# Patient Record
Sex: Female | Born: 1995 | Race: Black or African American | Hispanic: No | Marital: Single | Smoking: Never smoker
Health system: Southern US, Community
[De-identification: ages and names within clinical notes are randomized; demographics above are authoritative.]

## PROBLEM LIST (undated history)

## (undated) ENCOUNTER — Inpatient Hospital Stay (HOSPITAL_COMMUNITY): Payer: Self-pay

## (undated) ENCOUNTER — Ambulatory Visit: Admission: EM | Payer: Medicaid Other

## (undated) DIAGNOSIS — J45909 Unspecified asthma, uncomplicated: Secondary | ICD-10-CM

## (undated) DIAGNOSIS — K8020 Calculus of gallbladder without cholecystitis without obstruction: Secondary | ICD-10-CM

## (undated) DIAGNOSIS — K5900 Constipation, unspecified: Secondary | ICD-10-CM

## (undated) DIAGNOSIS — N390 Urinary tract infection, site not specified: Secondary | ICD-10-CM

## (undated) DIAGNOSIS — D649 Anemia, unspecified: Secondary | ICD-10-CM

## (undated) DIAGNOSIS — R569 Unspecified convulsions: Secondary | ICD-10-CM

## (undated) DIAGNOSIS — N189 Chronic kidney disease, unspecified: Secondary | ICD-10-CM

## (undated) HISTORY — PX: TYMPANOSTOMY TUBE PLACEMENT: SHX32

## (undated) HISTORY — DX: Unspecified asthma, uncomplicated: J45.909

## (undated) HISTORY — PX: BACK SURGERY: SHX140

## (undated) HISTORY — DX: Calculus of gallbladder without cholecystitis without obstruction: K80.20

---
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
## 2017-01-05 ENCOUNTER — Inpatient Hospital Stay (HOSPITAL_COMMUNITY)
Admission: AD | Admit: 2017-01-05 | Discharge: 2017-01-06 | Disposition: A | Discharge status: Home / Self Care | Payer: Medicaid Other | Source: Ambulatory Visit | Attending: Obstetrics & Gynecology | Admitting: Obstetrics & Gynecology

## 2017-01-05 DIAGNOSIS — Z283 Underimmunization status: Secondary | ICD-10-CM

## 2017-01-05 DIAGNOSIS — Z87891 Personal history of nicotine dependence: Secondary | ICD-10-CM | POA: Insufficient documentation

## 2017-01-05 DIAGNOSIS — O26893 Other specified pregnancy related conditions, third trimester: Secondary | ICD-10-CM | POA: Insufficient documentation

## 2017-01-05 DIAGNOSIS — O26833 Pregnancy related renal disease, third trimester: Secondary | ICD-10-CM | POA: Diagnosis not present

## 2017-01-05 DIAGNOSIS — R109 Unspecified abdominal pain: Secondary | ICD-10-CM

## 2017-01-05 DIAGNOSIS — O2340 Unspecified infection of urinary tract in pregnancy, unspecified trimester: Secondary | ICD-10-CM

## 2017-01-05 DIAGNOSIS — O2343 Unspecified infection of urinary tract in pregnancy, third trimester: Secondary | ICD-10-CM | POA: Diagnosis not present

## 2017-01-05 DIAGNOSIS — O9989 Other specified diseases and conditions complicating pregnancy, childbirth and the puerperium: Secondary | ICD-10-CM

## 2017-01-05 DIAGNOSIS — N189 Chronic kidney disease, unspecified: Secondary | ICD-10-CM | POA: Insufficient documentation

## 2017-01-05 DIAGNOSIS — O09899 Supervision of other high risk pregnancies, unspecified trimester: Secondary | ICD-10-CM

## 2017-01-05 DIAGNOSIS — K8020 Calculus of gallbladder without cholecystitis without obstruction: Secondary | ICD-10-CM

## 2017-01-05 DIAGNOSIS — R102 Pelvic and perineal pain: Secondary | ICD-10-CM | POA: Diagnosis present

## 2017-01-05 DIAGNOSIS — O26613 Liver and biliary tract disorders in pregnancy, third trimester: Secondary | ICD-10-CM | POA: Insufficient documentation

## 2017-01-05 DIAGNOSIS — J45909 Unspecified asthma, uncomplicated: Secondary | ICD-10-CM | POA: Diagnosis not present

## 2017-01-05 DIAGNOSIS — O99513 Diseases of the respiratory system complicating pregnancy, third trimester: Secondary | ICD-10-CM | POA: Diagnosis not present

## 2017-01-05 DIAGNOSIS — Z3A33 33 weeks gestation of pregnancy: Secondary | ICD-10-CM | POA: Insufficient documentation

## 2017-01-05 LAB — URINALYSIS, ROUTINE W REFLEX MICROSCOPIC
BILIRUBIN URINE: NEGATIVE
Glucose, UA: NEGATIVE mg/dL
HGB URINE DIPSTICK: NEGATIVE
Ketones, ur: NEGATIVE mg/dL
NITRITE: NEGATIVE
PROTEIN: NEGATIVE mg/dL
Specific Gravity, Urine: 1.005 (ref 1.005–1.030)
pH: 7 (ref 5.0–8.0)

## 2017-01-05 NOTE — MAU Note (Signed)
Pt not in lobby.  

## 2017-01-05 NOTE — Discharge Instructions (Signed)
Low-Fat Diet for Pancreatitis or Gallbladder Conditions A low-fat diet can be helpful if you have pancreatitis or a gallbladder condition. With these conditions, your pancreas and gallbladder have trouble digesting fats. A healthy eating plan with less fat will help rest your pancreas and gallbladder and reduce your symptoms. What do I need to know about this diet?  Eat a low-fat diet. ? Reduce your fat intake to less than 20-30% of your total daily calories. This is less than 50-60 g of fat per day. ? Remember that you need some fat in your diet. Ask your dietician what your daily goal should be. ? Choose nonfat and low-fat healthy foods. Look for the words "nonfat," "low fat," or "fat free." ? As a guide, look on the label and choose foods with less than 3 g of fat per serving. Eat only one serving.  Avoid alcohol.  Do not smoke. If you need help quitting, talk with your health care provider.  Eat small frequent meals instead of three large heavy meals. What foods can I eat? Grains Include healthy grains and starches such as potatoes, wheat bread, fiber-rich cereal, and brown rice. Choose whole grain options whenever possible. In adults, whole grains should account for 45-65% of your daily calories. Fruits and Vegetables Eat plenty of fruits and vegetables. Fresh fruits and vegetables add fiber to your diet. Meats and Other Protein Sources Eat lean meat such as chicken and pork. Trim any fat off of meat before cooking it. Eggs, fish, and beans are other sources of protein. In adults, these foods should account for 10-35% of your daily calories. Dairy Choose low-fat milk and dairy options. Dairy includes fat and protein, as well as calcium. Fats and Oils Limit high-fat foods such as fried foods, sweets, baked goods, sugary drinks. Other Creamy sauces and condiments, such as mayonnaise, can add extra fat. Think about whether or not you need to use them, or use smaller amounts or low fat  options. What foods are not recommended?  High fat foods, such as: ? Baked goods. ? Ice cream. ? French toast. ? Sweet rolls. ? Pizza. ? Cheese bread. ? Foods covered with batter, butter, creamy sauces, or cheese. ? Fried foods. ? Sugary drinks and desserts.  Foods that cause gas or bloating This information is not intended to replace advice given to you by your health care provider. Make sure you discuss any questions you have with your health care provider. Document Released: 08/03/2013 Document Revised: 01/04/2016 Document Reviewed: 07/12/2013 Elsevier Interactive Patient Education  2017 Elsevier Inc.  

## 2017-01-05 NOTE — MAU Provider Note (Signed)
History     CSN: [id number]  Arrival date and time: 01/05/17 2252   First Provider Initiated Contact with Patient 01/05/17 2341      Chief Complaint  Patient presents with  . Pelvic Pain   Frances [name] is a 21 y.o. G1P0 at 4564w2d who presents today with abdominal pain. Pain is mostly upper abdominal/epigastric that radiates to her back. She did have some pelvic pressure earlier today as well. She denies any VB or LOF. She reports normal fetal movement. She has history of gallstones. No other complications with this pregnancy. She last ate waffles before the pain started.    Pelvic Pain  The patient's primary symptoms include pelvic pain. The patient's pertinent negatives include no vaginal discharge. This is a new problem. The current episode started today. The problem occurs intermittently. The problem has been unchanged. Pain severity now: 8/10. The problem affects both sides. She is pregnant. Associated symptoms include abdominal pain. Pertinent negatives include no chills, dysuria, fever, frequency, nausea, urgency or vomiting. The vaginal discharge was normal. There has been no bleeding. Exacerbated by: No intercourse in the last 24 hours.  She has tried nothing for the symptoms. Menstrual history: LMP 05/17/17   Abdominal Pain  This is a new problem. The current episode started today. The onset quality is sudden. The problem occurs intermittently. The problem has been unchanged. The pain is located in the epigastric region. The pain is at a severity of 8/10. The quality of the pain is sharp. The abdominal pain radiates to the back. Pertinent negatives include no dysuria, fever, frequency, nausea or vomiting. Nothing aggravates the pain. The pain is relieved by nothing. She has tried nothing for the symptoms. Her past medical history is significant for gallstones.    Past Medical History:  Diagnosis Date  . Asthma 1999   as a child  . Chronic kidney disease   . Constipation   .  UTI (lower urinary tract infection)     Past Surgical History:  Procedure Laterality Date  . TYMPANOSTOMY TUBE PLACEMENT      Family History  Problem Relation Age of Onset  . Asthma Mother   . Hypertension Mother   . Asthma Sister   . Birth defects Sister     Social History  Substance Use Topics  . Smoking status: Former Smoker    Types: Cigarettes  . Smokeless tobacco: Former NeurosurgeonUser  . Alcohol use Yes     Comment: occ    Allergies: No Known Allergies  Prescriptions Prior to Admission  Medication Sig Dispense Refill Last Dose  . docusate sodium (COLACE) 100 MG capsule Take 1 capsule (100 mg total) by mouth 2 (two) times daily as needed. 30 capsule 2   . ferrous gluconate (FERGON) 324 MG tablet Take 1 tablet (324 mg total) by mouth 2 (two) times daily with a meal. 60 tablet 3   . metoCLOPramide (REGLAN) 10 MG tablet Take 1 tablet (10 mg total) by mouth every 6 (six) hours as needed for nausea. 20 tablet 0 Taking  . nitrofurantoin, macrocrystal-monohydrate, (MACROBID) 100 MG capsule One tab po bid (take 2nd dose qhs so it can sit in your bladder overnight) x 10d and then qhs for the rest of pregnancy (Patient not taking: Reported on 12/24/2016) 40 capsule 4 Not Taking  . oxyCODONE (OXY IR/ROXICODONE) 5 MG immediate release tablet Take 1 tablet (5 mg total) by mouth every 6 (six) hours as needed for severe pain. (Patient not taking: Reported on  12/24/2016) 20 tablet 0 Not Taking  . Prenatal Vit-Fe Fumarate-FA (PRENATAL COMPLETE) 14-0.4 MG TABS Take 1 tablet by mouth daily. 60 each 0 Taking    Review of Systems  Constitutional: Negative for chills and fever.  Gastrointestinal: Positive for abdominal pain. Negative for nausea and vomiting.  Genitourinary: Positive for pelvic pain. Negative for dysuria, frequency, urgency, vaginal bleeding and vaginal discharge.   Physical Exam   Blood pressure 114/70, pulse 99, resp. rate 18, height 5' 7.5" (1.715 m), weight 191 lb (86.6 kg), last  menstrual period 05/17/2016, SpO2 99 %.  Physical Exam  Nursing note and vitals reviewed. Constitutional: She is oriented to person, place, and time. She appears well-developed and well-nourished. No distress.  HENT:  Head: Normocephalic.  Cardiovascular: Normal rate.   Respiratory: Effort normal.  GI: Soft. There is no tenderness. There is no rebound.  Genitourinary:  Genitourinary Comments: Cervix: closed/thick/ballotable   Neurological: She is alert and oriented to person, place, and time.  Skin: Skin is warm and dry.  Psychiatric: She has a normal mood and affect.   FHT: 150, moderate with 15x15 accels, no decels Toco: no UCs  MAU Course  Procedures  MDM   Assessment and Plan   Gallstones Abdominal pain in pregnancy, 3rd trimester  [redacted] week gestation   DC home Comfort measures reviewed  Low fat diet emphasized 3rd Trimester precautions  PTL precautions  Fetal kick counts RX: none Return to MAU as needed FU with OB as planned  Follow-up Information    [facility].   Specialty:  Obstetrics and Gynecology Contact information: [address] [place] [address] [phone]           [name] 01/05/2017, 11:42 PM

## 2017-01-05 NOTE — MAU Note (Signed)
Pt c/o constant abdominal pain and lower back pain all day today and pelvic pressure. Pt unsure if she is having contractions. Pt denies LOF or vaginal bleeding. Reports good fetal movement.

## 2017-01-15 ENCOUNTER — Ambulatory Visit (INDEPENDENT_AMBULATORY_CARE_PROVIDER_SITE_OTHER): Payer: Medicaid Other | Admitting: Obstetrics and Gynecology

## 2017-01-15 VITALS — BP 119/63 | HR 92 | Wt 193.2 lb

## 2017-01-15 DIAGNOSIS — O2340 Unspecified infection of urinary tract in pregnancy, unspecified trimester: Secondary | ICD-10-CM

## 2017-01-15 DIAGNOSIS — O99013 Anemia complicating pregnancy, third trimester: Secondary | ICD-10-CM

## 2017-01-15 DIAGNOSIS — O2343 Unspecified infection of urinary tract in pregnancy, third trimester: Secondary | ICD-10-CM

## 2017-01-15 DIAGNOSIS — K8020 Calculus of gallbladder without cholecystitis without obstruction: Secondary | ICD-10-CM

## 2017-01-15 DIAGNOSIS — D649 Anemia, unspecified: Secondary | ICD-10-CM

## 2017-01-15 DIAGNOSIS — O99613 Diseases of the digestive system complicating pregnancy, third trimester: Secondary | ICD-10-CM

## 2017-01-15 DIAGNOSIS — Z3480 Encounter for supervision of other normal pregnancy, unspecified trimester: Secondary | ICD-10-CM

## 2017-01-15 NOTE — Progress Notes (Signed)
Prenatal Visit Note Date: 01/15/2017 Clinic: [facility]  Subjective:  Frances [name] is a 21 y.o. G1P0 at 7690w5d being seen today for ongoing prenatal care.  She is currently monitored for the following issues for this low-risk pregnancy and has Supervision of normal pregnancy, antepartum; Rubella non-immune status, antepartum; UTI in pregnancy, antepartum; Healthcare maintenance; Recurrent UTI; Gallstones; and Anemia affecting pregnancy on her problem list.  Patient reports no complaints.   Contractions: Not present. Vag. Bleeding: None.  Movement: Present. Denies leaking of fluid.   The following portions of the patient's history were reviewed and updated as appropriate: allergies, current medications, past family history, past medical history, past social history, past surgical history and problem list. Problem list updated.  Objective:   Vitals:   01/15/17 0856  BP: 119/63  Pulse: 92  Weight: 193 lb 3.2 oz (87.6 kg)    Fetal Status: Fetal Heart Rate (bpm): 151 (Simultaneous filing. User may not have seen previous data.) Fundal Height: 33 cm Movement: Present  Presentation: Vertex  General:  Alert, oriented and cooperative. Patient is in no acute distress.  Skin: Skin is warm and dry. No rash noted.   Cardiovascular: Normal heart rate noted  Respiratory: Normal respiratory effort, no problems with respiration noted  Abdomen: Soft, gravid, appropriate for gestational age. Pain/Pressure: Absent     Pelvic:  Cervical exam deferred        Extremities: Normal range of motion.  Edema: None  Mental Status: Normal mood and affect. Normal behavior. Normal judgment and thought content.   Urinalysis:      Assessment and Plan:  Pregnancy: G1P0 at 4390w5d  1. Supervision of other normal pregnancy, antepartum Routine care. gbs nv. D/w pt re: BC  2. UTI in pregnancy, antepartum No issues on qhs macrobid.   3. Gallstones No issues  4. Anemia affecting pregnancy  in third trimester Doing well on bid iron  Preterm labor symptoms and general obstetric precautions including but not limited to vaginal bleeding, contractions, leaking of fluid and fetal movement were reviewed in detail with the patient. Please refer to After Visit Summary for other counseling recommendations.  Return in about 2 weeks (around 01/29/2017).   [place] BingPickens, Gurnie Duris, MD

## 2017-01-29 ENCOUNTER — Other Ambulatory Visit (HOSPITAL_COMMUNITY)
Admission: RE | Admit: 2017-01-29 | Discharge: 2017-01-29 | Disposition: A | Discharge status: Home / Self Care | Payer: Medicaid Other | Source: Ambulatory Visit | Attending: Advanced Practice Midwife | Admitting: Advanced Practice Midwife

## 2017-01-29 ENCOUNTER — Encounter: Payer: Self-pay | Admitting: Advanced Practice Midwife

## 2017-01-29 ENCOUNTER — Ambulatory Visit (INDEPENDENT_AMBULATORY_CARE_PROVIDER_SITE_OTHER): Payer: Medicaid Other | Admitting: Advanced Practice Midwife

## 2017-01-29 VITALS — BP 123/65 | HR 94 | Wt 195.1 lb

## 2017-01-29 DIAGNOSIS — Z113 Encounter for screening for infections with a predominantly sexual mode of transmission: Secondary | ICD-10-CM

## 2017-01-29 DIAGNOSIS — Z3480 Encounter for supervision of other normal pregnancy, unspecified trimester: Secondary | ICD-10-CM

## 2017-01-29 DIAGNOSIS — Z3483 Encounter for supervision of other normal pregnancy, third trimester: Secondary | ICD-10-CM | POA: Insufficient documentation

## 2017-01-29 LAB — OB RESULTS CONSOLE GBS: GBS: NEGATIVE

## 2017-01-29 NOTE — Progress Notes (Signed)
Patient states she noticed a lot of thick mucous when she wiped after using the bathroom. Patient states this happened last week  3 to4 days in a row, it was clear and white in color with no odor.

## 2017-01-29 NOTE — Progress Notes (Signed)
   PRENATAL VISIT NOTE  Subjective:  Malachi ParadiseCaaliegh N Bambach is a 21 y.o. G1P0 at 2574w5d being seen today for ongoing prenatal care.  She is currently monitored for the following issues for this low-risk pregnancy and has Supervision of normal pregnancy, antepartum; Rubella non-immune status, antepartum; UTI in pregnancy, antepartum; Healthcare maintenance; Recurrent UTI; Gallstones; and Anemia affecting pregnancy on her problem list.  Patient reports no complaints.  Contractions: Irregular. Vag. Bleeding: None.  Movement: Present. Denies leaking of fluid.   The following portions of the patient's history were reviewed and updated as appropriate: allergies, current medications, past family history, past medical history, past social history, past surgical history and problem list. Problem list updated.  Objective:   Vitals:   01/29/17 1420  BP: 123/65  Pulse: 94  Weight: 195 lb 1.6 oz (88.5 kg)    Fetal Status: Fetal Heart Rate (bpm): 150   Movement: Present     General:  Alert, oriented and cooperative. Patient is in no acute distress.  Skin: Skin is warm and dry. No rash noted.   Cardiovascular: Normal heart rate noted  Respiratory: Normal respiratory effort, no problems with respiration noted  Abdomen: Soft, gravid, appropriate for gestational age. Pain/Pressure: Present     Pelvic:  Cervical exam deferred        Extremities: Normal range of motion.  Edema: None  Mental Status: Normal mood and affect. Normal behavior. Normal judgment and thought content.   Assessment and Plan:  Pregnancy: G1P0 at 8374w5d  1. Supervision of other normal pregnancy, antepartum  - Culture, beta strep (group b only) - [facility]Chlam[facility]  Term labor symptoms and general obstetric precautions including but not limited to vaginal bleeding, contractions, leaking of fluid and fetal movement were reviewed in detail with the patient. Please refer to After Visit Summary for other  counseling recommendations.  Return in about 1 week (around 02/05/2017).   Sharen CounterLisa Leftwich-Kirby, CNM

## 2017-01-29 NOTE — Patient Instructions (Signed)
Labor Precautions Reasons to come to [facility]:  1.  Contractions are  5 minutes apart or less, each last 1 minute, these have been going on for 1-2 hours, and you cannot walk or talk during them 2.  You have a large gush of fluid, or a trickle of fluid that will not stop and you have to wear a pad 3.  You have bleeding that is bright red, heavier than spotting--like menstrual bleeding (spotting can be normal in early labor or after a check of your cervix) 4.  You do not feel the baby moving like he/she normally does    Third Trimester of Pregnancy The third trimester is from week 28 through week 40 (months 7 through 9). The third trimester is a time when the unborn baby (fetus) is growing rapidly. At the end of the ninth month, the fetus is about 20 inches in length and weighs 6-10 pounds. Body changes during your third trimester Your body will continue to go through many changes during pregnancy. The changes vary from woman to woman. During the third trimester:  Your weight will continue to increase. You can expect to gain 25-35 pounds (11-16 kg) by the end of the pregnancy.  You may begin to get stretch marks on your hips, abdomen, and breasts.  You may urinate more often because the fetus is moving lower into your pelvis and pressing on your bladder.  You may develop or continue to have heartburn. This is caused by increased hormones that slow down muscles in the digestive tract.  You may develop or continue to have constipation because increased hormones slow digestion and cause the muscles that push waste through your intestines to relax.  You may develop hemorrhoids. These are swollen veins (varicose veins) in the rectum that can itch or be painful.  You may develop swollen, bulging veins (varicose veins) in your legs.  You may have increased body aches in the pelvis, back, or thighs. This is due to weight gain and increased hormones that are relaxing your joints.  You may have  changes in your hair. These can include thickening of your hair, rapid growth, and changes in texture. Some women also have hair loss during or after pregnancy, or hair that feels dry or thin. Your hair will most likely return to normal after your baby is born.  Your breasts will continue to grow and they will continue to become tender. A yellow fluid (colostrum) may leak from your breasts. This is the first milk you are producing for your baby.  Your belly button may stick out.  You may notice more swelling in your hands, face, or ankles.  You may have increased tingling or numbness in your hands, arms, and legs. The skin on your belly may also feel numb.  You may feel short of breath because of your expanding uterus.  You may have more problems sleeping. This can be caused by the size of your belly, increased need to urinate, and an increase in your body's metabolism.  You may notice the fetus "dropping," or moving lower in your abdomen (lightening).  You may have increased vaginal discharge.  You may notice your joints feel loose and you may have pain around your pelvic bone.  What to expect at prenatal visits You will have prenatal exams every 2 weeks until week 36. Then you will have weekly prenatal exams. During a routine prenatal visit:  You will be weighed to make sure you and the baby are growing   normally.  Your blood pressure will be taken.  Your abdomen will be measured to track your baby's growth.  The fetal heartbeat will be listened to.  Any test results from the previous visit will be discussed.  You may have a cervical check near your due date to see if your cervix has softened or thinned (effaced).  You will be tested for Group B streptococcus. This happens between 35 and 37 weeks.  Your health care provider may ask you:  What your birth plan is.  How you are feeling.  If you are feeling the baby move.  If you have had any abnormal symptoms, such as  leaking fluid, bleeding, severe headaches, or abdominal cramping.  If you are using any tobacco products, including cigarettes, chewing tobacco, and electronic cigarettes.  If you have any questions.  Other tests or screenings that may be performed during your third trimester include:  Blood tests that check for low iron levels (anemia).  Fetal testing to check the health, activity level, and growth of the fetus. Testing is done if you have certain medical conditions or if there are problems during the pregnancy.  Nonstress test (NST). This test checks the health of your baby to make sure there are no signs of problems, such as the baby not getting enough oxygen. During this test, a belt is placed around your belly. The baby is made to move, and its heart rate is monitored during movement.  What is false labor? False labor is a condition in which you feel small, irregular tightenings of the muscles in the womb (contractions) that usually go away with rest, changing position, or drinking water. These are called Braxton Hicks contractions. Contractions may last for hours, days, or even weeks before true labor sets in. If contractions come at regular intervals, become more frequent, increase in intensity, or become painful, you should see your health care provider. What are the signs of labor?  Abdominal cramps.  Regular contractions that start at 10 minutes apart and become stronger and more frequent with time.  Contractions that start on the top of the uterus and spread down to the lower abdomen and back.  Increased pelvic pressure and dull back pain.  A watery or bloody mucus discharge that comes from the vagina.  Leaking of amniotic fluid. This is also known as your "water breaking." It could be a slow trickle or a gush. Let your health care provider know if it has a color or strange odor. If you have any of these signs, call your health care provider right away, even if it is before  your due date. Follow these instructions at home: Medicines  Follow your health care provider's instructions regarding medicine use. Specific medicines may be either safe or unsafe to take during pregnancy.  Take a prenatal vitamin that contains at least 600 micrograms (mcg) of folic acid.  If you develop constipation, try taking a stool softener if your health care provider approves. Eating and drinking  Eat a balanced diet that includes fresh fruits and vegetables, whole grains, good sources of protein such as meat, eggs, or tofu, and low-fat dairy. Your health care provider will help you determine the amount of weight gain that is right for you.  Avoid raw meat and uncooked cheese. These carry germs that can cause birth defects in the baby.  If you have low calcium intake from food, talk to your health care provider about whether you should take a daily calcium supplement.    Eat four or five small meals rather than three large meals a day.  Limit foods that are high in fat and processed sugars, such as fried and sweet foods.  To prevent constipation: ? Drink enough fluid to keep your urine clear or pale yellow. ? Eat foods that are high in fiber, such as fresh fruits and vegetables, whole grains, and beans. Activity  Exercise only as directed by your health care provider. Most women can continue their usual exercise routine during pregnancy. Try to exercise for 30 minutes at least 5 days a week. Stop exercising if you experience uterine contractions.  Avoid heavy lifting.  Do not exercise in extreme heat or humidity, or at high altitudes.  Wear low-heel, comfortable shoes.  Practice good posture.  You may continue to have sex unless your health care provider tells you otherwise. Relieving pain and discomfort  Take frequent breaks and rest with your legs elevated if you have leg cramps or low back pain.  Take warm sitz baths to soothe any pain or discomfort caused by  hemorrhoids. Use hemorrhoid cream if your health care provider approves.  Wear a good support bra to prevent discomfort from breast tenderness.  If you develop varicose veins: ? Wear support pantyhose or compression stockings as told by your healthcare provider. ? Elevate your feet for 15 minutes, 3-4 times a day. Prenatal care  Write down your questions. Take them to your prenatal visits.  Keep all your prenatal visits as told by your health care provider. This is important. Safety  Wear your seat belt at all times when driving.  Make a list of emergency phone numbers, including numbers for family, friends, the hospital, and police and fire departments. General instructions  Avoid cat litter boxes and soil used by cats. These carry germs that can cause birth defects in the baby. If you have a cat, ask someone to clean the litter box for you.  Do not travel far distances unless it is absolutely necessary and only with the approval of your health care provider.  Do not use hot tubs, steam rooms, or saunas.  Do not drink alcohol.  Do not use any products that contain nicotine or tobacco, such as cigarettes and e-cigarettes. If you need help quitting, ask your health care provider.  Do not use any medicinal herbs or unprescribed drugs. These chemicals affect the formation and growth of the baby.  Do not douche or use tampons or scented sanitary pads.  Do not cross your legs for long periods of time.  To prepare for the arrival of your baby: ? Take prenatal classes to understand, practice, and ask questions about labor and delivery. ? Make a trial run to the hospital. ? Visit the hospital and tour the maternity area. ? Arrange for maternity or paternity leave through employers. ? Arrange for family and friends to take care of pets while you are in the hospital. ? Purchase a rear-facing car seat and make sure you know how to install it in your car. ? Pack your hospital  bag. ? Prepare the baby's nursery. Make sure to remove all pillows and stuffed animals from the baby's crib to prevent suffocation.  Visit your dentist if you have not gone during your pregnancy. Use a soft toothbrush to brush your teeth and be gentle when you floss. Contact a health care provider if:  You are unsure if you are in labor or if your water has broken.  You become dizzy.  You have   mild pelvic cramps, pelvic pressure, or nagging pain in your abdominal area.  You have lower back pain.  You have persistent nausea, vomiting, or diarrhea.  You have an unusual or bad smelling vaginal discharge.  You have pain when you urinate. Get help right away if:  Your water breaks before 37 weeks.  You have regular contractions less than 5 minutes apart before 37 weeks.  You have a fever.  You are leaking fluid from your vagina.  You have spotting or bleeding from your vagina.  You have severe abdominal pain or cramping.  You have rapid weight loss or weight gain.  You have shortness of breath with chest pain.  You notice sudden or extreme swelling of your face, hands, ankles, feet, or legs.  Your baby makes fewer than 10 movements in 2 hours.  You have severe headaches that do not go away when you take medicine.  You have vision changes. Summary  The third trimester is from week 28 through week 40, months 7 through 9. The third trimester is a time when the unborn baby (fetus) is growing rapidly.  During the third trimester, your discomfort may increase as you and your baby continue to gain weight. You may have abdominal, leg, and back pain, sleeping problems, and an increased need to urinate.  During the third trimester your breasts will keep growing and they will continue to become tender. A yellow fluid (colostrum) may leak from your breasts. This is the first milk you are producing for your baby.  False labor is a condition in which you feel small, irregular  tightenings of the muscles in the womb (contractions) that eventually go away. These are called Braxton Hicks contractions. Contractions may last for hours, days, or even weeks before true labor sets in.  Signs of labor can include: abdominal cramps; regular contractions that start at 10 minutes apart and become stronger and more frequent with time; watery or bloody mucus discharge that comes from the vagina; increased pelvic pressure and dull back pain; and leaking of amniotic fluid. This information is not intended to replace advice given to you by your health care provider. Make sure you discuss any questions you have with your health care provider. Document Released: 07/23/2001 Document Revised: 01/04/2016 Document Reviewed: 09/29/2012 Elsevier Interactive Patient Education  2017 [employer]  

## 2017-01-30 LAB — GC/CHLAMYDIA PROBE AMP (~~LOC~~) NOT AT ARMC
CHLAMYDIA, DNA PROBE: NEGATIVE
Neisseria Gonorrhea: NEGATIVE

## 2017-02-02 LAB — CULTURE, BETA STREP (GROUP B ONLY): Strep Gp B Culture: NEGATIVE

## 2017-02-05 ENCOUNTER — Ambulatory Visit (INDEPENDENT_AMBULATORY_CARE_PROVIDER_SITE_OTHER): Payer: Medicaid Other | Admitting: Student

## 2017-02-05 DIAGNOSIS — Z3403 Encounter for supervision of normal first pregnancy, third trimester: Secondary | ICD-10-CM

## 2017-02-05 DIAGNOSIS — Z3400 Encounter for supervision of normal first pregnancy, unspecified trimester: Secondary | ICD-10-CM

## 2017-02-05 NOTE — Progress Notes (Signed)
   PRENATAL VISIT NOTE  Subjective:  Frances [name] is a 21 y.o. G1P0 at 3165w5d being seen today for ongoing prenatal care.  She is currently monitored for the following issues for this low-risk pregnancy and has Supervision of normal pregnancy, antepartum; Rubella non-immune status, antepartum; UTI in pregnancy, antepartum; Healthcare maintenance; Recurrent UTI; Gallstones; and Anemia affecting pregnancy on her problem list.  Patient reports no complaints.  Contractions: Irregular.  .  Movement: Present. Denies leaking of fluid.   The following portions of the patient's history were reviewed and updated as appropriate: allergies, current medications, past family history, past medical history, past social history, past surgical history and problem list. Problem list updated.  Objective:   Vitals:   02/05/17 0927  BP: 122/63  Pulse: 73  Weight: 197 lb 4.8 oz (89.5 kg)    Fetal Status: Fetal Heart Rate (bpm): 145 Fundal Height: 37 cm Movement: Present     General:  Alert, oriented and cooperative. Patient is in no acute distress.  Skin: Skin is warm and dry. No rash noted.   Cardiovascular: Normal heart rate noted  Respiratory: Normal respiratory effort, no problems with respiration noted  Abdomen: Soft, gravid, appropriate for gestational age. Pain/Pressure: Present     Pelvic:  Cervical exam deferred        Extremities: Normal range of motion.  Edema: None  Mental Status: Normal mood and affect. Normal behavior. Normal judgment and thought content.   Assessment and Plan:  Pregnancy: G1P0 at 7665w5d  1. Supervision of normal first pregnancy, antepartum  Patient feeling well; no complaints. She is still taking her iron supplements.   Term labor symptoms and general obstetric precautions including but not limited to vaginal bleeding, contractions, leaking of fluid and fetal movement were reviewed in detail with the patient. Please refer to After Visit Summary for other counseling  recommendations.  Return in about 1 week (around 02/12/2017) for ROB.   Marylene LandKathryn Lorraine Lisseth Brazeau, CNM

## 2017-02-05 NOTE — Patient Instructions (Signed)
Etonogestrel implant What is this medicine? ETONOGESTREL (et oh noe JES trel) is a contraceptive (birth control) device. It is used to prevent pregnancy. It can be used for up to 3 years. This medicine may be used for other purposes; ask your health care provider or pharmacist if you have questions. COMMON BRAND NAME(S): Implanon, Nexplanon What should I tell my health care provider before I take this medicine? They need to know if you have any of these conditions: -abnormal vaginal bleeding -blood vessel disease or blood clots -cancer of the breast, cervix, or liver -depression -diabetes -gallbladder disease -headaches -heart disease or recent heart attack -high blood pressure -high cholesterol -kidney disease -liver disease -renal disease -seizures -tobacco smoker -an unusual or allergic reaction to etonogestrel, other hormones, anesthetics or antiseptics, medicines, foods, dyes, or preservatives -pregnant or trying to get pregnant -breast-feeding How should I use this medicine? This device is inserted just under the skin on the inner side of your upper arm by a health care professional. Talk to your pediatrician regarding the use of this medicine in children. Special care may be needed. Overdosage: If you think you have taken too much of this medicine contact a poison control center or emergency room at once. NOTE: This medicine is only for you. Do not share this medicine with others. What if I miss a dose? This does not apply. What may interact with this medicine? Do not take this medicine with any of the following medications: -amprenavir -bosentan -fosamprenavir This medicine may also interact with the following medications: -barbiturate medicines for inducing sleep or treating seizures -certain medicines for fungal infections like ketoconazole and itraconazole -grapefruit juice -griseofulvin -medicines to treat seizures like carbamazepine, felbamate, oxcarbazepine,  phenytoin, topiramate -modafinil -phenylbutazone -rifampin -rufinamide -some medicines to treat HIV infection like atazanavir, indinavir, lopinavir, nelfinavir, tipranavir, ritonavir -St. John's wort This list may not describe all possible interactions. Give your health care provider a list of all the medicines, herbs, non-prescription drugs, or dietary supplements you use. Also tell them if you smoke, drink alcohol, or use illegal drugs. Some items may interact with your medicine. What should I watch for while using this medicine? This product does not protect you against HIV infection (AIDS) or other sexually transmitted diseases. You should be able to feel the implant by pressing your fingertips over the skin where it was inserted. Contact your doctor if you cannot feel the implant, and use a non-hormonal birth control method (such as condoms) until your doctor confirms that the implant is in place. If you feel that the implant may have broken or become bent while in your arm, contact your healthcare provider. What side effects may I notice from receiving this medicine? Side effects that you should report to your doctor or health care professional as soon as possible: -allergic reactions like skin rash, itching or hives, swelling of the face, lips, or tongue -breast lumps -changes in emotions or moods -depressed mood -heavy or prolonged menstrual bleeding -pain, irritation, swelling, or bruising at the insertion site -scar at site of insertion -signs of infection at the insertion site such as fever, and skin redness, pain or discharge -signs of pregnancy -signs and symptoms of a blood clot such as breathing problems; changes in vision; chest pain; severe, sudden headache; pain, swelling, warmth in the leg; trouble speaking; sudden numbness or weakness of the face, arm or leg -signs and symptoms of liver injury like dark yellow or brown urine; general ill feeling or flu-like symptoms;  light-colored   stools; loss of appetite; nausea; right upper belly pain; unusually weak or tired; yellowing of the eyes or skin -unusual vaginal bleeding, discharge -signs and symptoms of a stroke like changes in vision; confusion; trouble speaking or understanding; severe headaches; sudden numbness or weakness of the face, arm or leg; trouble walking; dizziness; loss of balance or coordination Side effects that usually do not require medical attention (report to your doctor or health care professional if they continue or are bothersome): -acne -back pain -breast pain -changes in weight -dizziness -general ill feeling or flu-like symptoms -headache -irregular menstrual bleeding -nausea -sore throat -vaginal irritation or inflammation This list may not describe all possible side effects. Call your doctor for medical advice about side effects. You may report side effects to FDA at 1-800-FDA-1088. Where should I keep my medicine? This drug is given in a hospital or clinic and will not be stored at home. NOTE: This sheet is a summary. It may not cover all possible information. If you have questions about this medicine, talk to your doctor, pharmacist, or health care provider.  2018 Elsevier/Gold Standard (2016-02-15 11:19:22)  

## 2017-02-11 ENCOUNTER — Ambulatory Visit (INDEPENDENT_AMBULATORY_CARE_PROVIDER_SITE_OTHER): Payer: Medicaid Other | Admitting: Medical

## 2017-02-11 VITALS — BP 130/64 | HR 89 | Wt 196.0 lb

## 2017-02-11 DIAGNOSIS — Z3403 Encounter for supervision of normal first pregnancy, third trimester: Secondary | ICD-10-CM

## 2017-02-11 DIAGNOSIS — Z3400 Encounter for supervision of normal first pregnancy, unspecified trimester: Secondary | ICD-10-CM

## 2017-02-11 NOTE — Patient Instructions (Signed)
Fetal Movement Counts °Patient Name: ________________________________________________ Patient Due Date: ____________________ °What is a fetal movement count? °A fetal movement count is the number of times that you feel your baby move during a certain amount of time. This may also be called a fetal kick count. A fetal movement count is recommended for every pregnant woman. You may be asked to start counting fetal movements as early as week 28 of your pregnancy. °Pay attention to when your baby is most active. You may notice your baby's sleep and wake cycles. You may also notice things that make your baby move more. You should do a fetal movement count: °· When your baby is normally most active. °· At the same time each day. ° °A good time to count movements is while you are resting, after having something to eat and drink. °How do I count fetal movements? °1. Find a quiet, comfortable area. Sit, or lie down on your side. °2. Write down the date, the start time and stop time, and the number of movements that you felt between those two times. Take this information with you to your health care visits. °3. For 2 hours, count kicks, flutters, swishes, rolls, and jabs. You should feel at least 10 movements during 2 hours. °4. You may stop counting after you have felt 10 movements. °5. If you do not feel 10 movements in 2 hours, have something to eat and drink. Then, keep resting and counting for 1 hour. If you feel at least 4 movements during that hour, you may stop counting. °Contact a health care provider if: °· You feel fewer than 4 movements in 2 hours. °· Your baby is not moving like he or she usually does. °Date: ____________ Start time: ____________ Stop time: ____________ Movements: ____________ °Date: ____________ Start time: ____________ Stop time: ____________ Movements: ____________ °Date: ____________ Start time: ____________ Stop time: ____________ Movements: ____________ °Date: ____________ Start time:  ____________ Stop time: ____________ Movements: ____________ °Date: ____________ Start time: ____________ Stop time: ____________ Movements: ____________ °Date: ____________ Start time: ____________ Stop time: ____________ Movements: ____________ °Date: ____________ Start time: ____________ Stop time: ____________ Movements: ____________ °Date: ____________ Start time: ____________ Stop time: ____________ Movements: ____________ °Date: ____________ Start time: ____________ Stop time: ____________ Movements: ____________ °This information is not intended to replace advice given to you by your health care provider. Make sure you discuss any questions you have with your health care provider. °Document Released: 08/28/2006 Document Revised: 03/27/2016 Document Reviewed: 09/07/2015 °Elsevier Interactive Patient Education © 2018 [employer] Inc[employer] °Braxton Hicks Contractions °Contractions of the uterus can occur throughout pregnancy, but they are not always a sign that you are in labor. You may have practice contractions called Braxton Hicks contractions. These false labor contractions are sometimes confused with true labor. °What are Braxton Hicks contractions? °Braxton Hicks contractions are tightening movements that occur in the muscles of the uterus before labor. Unlike true labor contractions, these contractions do not result in opening (dilation) and thinning of the cervix. Toward the end of pregnancy (32-34 weeks), Braxton Hicks contractions can happen more often and may become stronger. These contractions are sometimes difficult to tell apart from true labor because they can be very uncomfortable. You should not feel embarrassed if you go to the hospital with false labor. °Sometimes, the only way to tell if you are in true labor is for your health care provider to look for changes in the cervix. The health care provider will do a physical exam and may monitor your contractions. If   you are not in true labor, the exam  should show that your cervix is not dilating and your water has not broken. °If there are no prenatal problems or other health problems associated with your pregnancy, it is completely safe for you to be sent home with false labor. You may continue to have Braxton Hicks contractions until you go into true labor. °How can I tell the difference between true labor and false labor? °· Differences °? False labor °? Contractions last 30-70 seconds.: Contractions are usually shorter and not as strong as true labor contractions. °? Contractions become very regular.: Contractions are usually irregular. °? Discomfort is usually felt in the top of the uterus, and it spreads to the lower abdomen and low back.: Contractions are often felt in the front of the lower abdomen and in the groin. °? Contractions do not go away with walking.: Contractions may go away when you walk around or change positions while lying down. °? Contractions usually become more intense and increase in frequency.: Contractions get weaker and are shorter-lasting as time goes on. °? The cervix dilates and gets thinner.: The cervix usually does not dilate or become thin. °Follow these instructions at home: °· Take over-the-counter and prescription medicines only as told by your health care provider. °· Keep up with your usual exercises and follow other instructions from your health care provider. °· Eat and drink lightly if you think you are going into labor. °· If Braxton Hicks contractions are making you uncomfortable: °? Change your position from lying down or resting to walking, or change from walking to resting. °? Sit and rest in a tub of warm water. °? Drink enough fluid to keep your urine clear or pale yellow. Dehydration may cause these contractions. °? Do slow and deep breathing several times an hour. °· Keep all follow-up prenatal visits as told by your health care provider. This is important. °Contact a health care provider if: °· You have a  fever. °· You have continuous pain in your abdomen. °Get help right away if: °· Your contractions become stronger, more regular, and closer together. °· You have fluid leaking or gushing from your vagina. °· You pass blood-tinged mucus (bloody show). °· You have bleeding from your vagina. °· You have low back pain that you never had before. °· You feel your baby’s head pushing down and causing pelvic pressure. °· Your baby is not moving inside you as much as it used to. °Summary °· Contractions that occur before labor are called Braxton Hicks contractions, false labor, or practice contractions. °· Braxton Hicks contractions are usually shorter, weaker, farther apart, and less regular than true labor contractions. True labor contractions usually become progressively stronger and regular and they become more frequent. °· Manage discomfort from Braxton Hicks contractions by changing position, resting in a warm bath, drinking plenty of water, or practicing deep breathing. °This information is not intended to replace advice given to you by your health care provider. Make sure you discuss any questions you have with your health care provider. °Document Released: 07/29/2005 Document Revised: 06/17/2016 Document Reviewed: 06/17/2016 °Elsevier Interactive Patient Education © 2017 Elsevier Inc. ° °

## 2017-02-11 NOTE — Progress Notes (Signed)
   PRENATAL VISIT NOTE  Subjective:  Frances [name] is a 21 y.o. G1P0 at 196w4d being seen today for ongoing prenatal care.  She is currently monitored for the following issues for this high-risk pregnancy and has Supervision of normal pregnancy, antepartum; Rubella non-immune status, antepartum; UTI in pregnancy, antepartum; Healthcare maintenance; Recurrent UTI; Gallstones; and Anemia affecting pregnancy on her problem list.  Patient reports no complaints.  Contractions: Not present. Vag. Bleeding: None.  Movement: Present. Denies leaking of fluid.   The following portions of the patient's history were reviewed and updated as appropriate: allergies, current medications, past family history, past medical history, past social history, past surgical history and problem list. Problem list updated.  Objective:   Vitals:   02/11/17 1004  BP: 130/64  Pulse: 89  Weight: 196 lb (88.9 kg)    Fetal Status: Fetal Heart Rate (bpm): 163 Fundal Height: 38 cm Movement: Present     General:  Alert, oriented and cooperative. Patient is in no acute distress.  Skin: Skin is warm and dry. No rash noted.   Cardiovascular: Normal heart rate noted  Respiratory: Normal respiratory effort, no problems with respiration noted  Abdomen: Soft, gravid, appropriate for gestational age. Pain/Pressure: Present     Pelvic:  Cervical exam deferred        Extremities: Normal range of motion.  Edema: None  Mental Status: Normal mood and affect. Normal behavior. Normal judgment and thought content.   Assessment and Plan:  Pregnancy: G1P0 at 196w4d  1. Supervision of normal first pregnancy, antepartum - Doing well, no complaints - Negative GBS discussed with patient  - Encouraged hospital tour with support person (Mom) prior to delivery   Term labor symptoms and general obstetric precautions including but not limited to vaginal bleeding, contractions, leaking of fluid and fetal movement were reviewed in detail with  the patient. Please refer to After Visit Summary for other counseling recommendations.  Return in about 1 week (around 02/18/2017) for LOB.  Vonzella NippleJulie Dorrine Montone, PA-C

## 2017-02-11 NOTE — Progress Notes (Signed)
Educated pt on [place] 

## 2017-02-20 ENCOUNTER — Encounter (HOSPITAL_COMMUNITY): Payer: Self-pay | Admitting: *Deleted

## 2017-02-20 ENCOUNTER — Ambulatory Visit (INDEPENDENT_AMBULATORY_CARE_PROVIDER_SITE_OTHER): Payer: Medicaid Other | Admitting: Medical

## 2017-02-20 ENCOUNTER — Telehealth (HOSPITAL_COMMUNITY): Payer: Self-pay | Admitting: *Deleted

## 2017-02-20 ENCOUNTER — Other Ambulatory Visit: Payer: Self-pay | Admitting: Medical

## 2017-02-20 VITALS — BP 135/71 | HR 88 | Wt 198.5 lb

## 2017-02-20 DIAGNOSIS — Z3403 Encounter for supervision of normal first pregnancy, third trimester: Secondary | ICD-10-CM

## 2017-02-20 DIAGNOSIS — Z3400 Encounter for supervision of normal first pregnancy, unspecified trimester: Secondary | ICD-10-CM

## 2017-02-20 NOTE — Progress Notes (Signed)
135  

## 2017-02-20 NOTE — Patient Instructions (Signed)
Fetal Movement Counts °Patient Name: ________________________________________________ Patient Due Date: ____________________ °What is a fetal movement count? °A fetal movement count is the number of times that you feel your baby move during a certain amount of time. This may also be called a fetal kick count. A fetal movement count is recommended for every pregnant woman. You may be asked to start counting fetal movements as early as week 28 of your pregnancy. °Pay attention to when your baby is most active. You may notice your baby's sleep and wake cycles. You may also notice things that make your baby move more. You should do a fetal movement count: °· When your baby is normally most active. °· At the same time each day. ° °A good time to count movements is while you are resting, after having something to eat and drink. °How do I count fetal movements? °1. Find a quiet, comfortable area. Sit, or lie down on your side. °2. Write down the date, the start time and stop time, and the number of movements that you felt between those two times. Take this information with you to your health care visits. °3. For 2 hours, count kicks, flutters, swishes, rolls, and jabs. You should feel at least 10 movements during 2 hours. °4. You may stop counting after you have felt 10 movements. °5. If you do not feel 10 movements in 2 hours, have something to eat and drink. Then, keep resting and counting for 1 hour. If you feel at least 4 movements during that hour, you may stop counting. °Contact a health care provider if: °· You feel fewer than 4 movements in 2 hours. °· Your baby is not moving like he or she usually does. °Date: ____________ Start time: ____________ Stop time: ____________ Movements: ____________ °Date: ____________ Start time: ____________ Stop time: ____________ Movements: ____________ °Date: ____________ Start time: ____________ Stop time: ____________ Movements: ____________ °Date: ____________ Start time:  ____________ Stop time: ____________ Movements: ____________ °Date: ____________ Start time: ____________ Stop time: ____________ Movements: ____________ °Date: ____________ Start time: ____________ Stop time: ____________ Movements: ____________ °Date: ____________ Start time: ____________ Stop time: ____________ Movements: ____________ °Date: ____________ Start time: ____________ Stop time: ____________ Movements: ____________ °Date: ____________ Start time: ____________ Stop time: ____________ Movements: ____________ °This information is not intended to replace advice given to you by your health care provider. Make sure you discuss any questions you have with your health care provider. °Document Released: 08/28/2006 Document Revised: 03/27/2016 Document Reviewed: 09/07/2015 °Elsevier Interactive Patient Education © 2018 [employer] Inc[employer] °Braxton Hicks Contractions °Contractions of the uterus can occur throughout pregnancy, but they are not always a sign that you are in labor. You may have practice contractions called Braxton Hicks contractions. These false labor contractions are sometimes confused with true labor. °What are Braxton Hicks contractions? °Braxton Hicks contractions are tightening movements that occur in the muscles of the uterus before labor. Unlike true labor contractions, these contractions do not result in opening (dilation) and thinning of the cervix. Toward the end of pregnancy (32-34 weeks), Braxton Hicks contractions can happen more often and may become stronger. These contractions are sometimes difficult to tell apart from true labor because they can be very uncomfortable. You should not feel embarrassed if you go to the hospital with false labor. °Sometimes, the only way to tell if you are in true labor is for your health care provider to look for changes in the cervix. The health care provider will do a physical exam and may monitor your contractions. If   you are not in true labor, the exam  should show that your cervix is not dilating and your water has not broken. °If there are no prenatal problems or other health problems associated with your pregnancy, it is completely safe for you to be sent home with false labor. You may continue to have Braxton Hicks contractions until you go into true labor. °How can I tell the difference between true labor and false labor? °· Differences °? False labor °? Contractions last 30-70 seconds.: Contractions are usually shorter and not as strong as true labor contractions. °? Contractions become very regular.: Contractions are usually irregular. °? Discomfort is usually felt in the top of the uterus, and it spreads to the lower abdomen and low back.: Contractions are often felt in the front of the lower abdomen and in the groin. °? Contractions do not go away with walking.: Contractions may go away when you walk around or change positions while lying down. °? Contractions usually become more intense and increase in frequency.: Contractions get weaker and are shorter-lasting as time goes on. °? The cervix dilates and gets thinner.: The cervix usually does not dilate or become thin. °Follow these instructions at home: °· Take over-the-counter and prescription medicines only as told by your health care provider. °· Keep up with your usual exercises and follow other instructions from your health care provider. °· Eat and drink lightly if you think you are going into labor. °· If Braxton Hicks contractions are making you uncomfortable: °? Change your position from lying down or resting to walking, or change from walking to resting. °? Sit and rest in a tub of warm water. °? Drink enough fluid to keep your urine clear or pale yellow. Dehydration may cause these contractions. °? Do slow and deep breathing several times an hour. °· Keep all follow-up prenatal visits as told by your health care provider. This is important. °Contact a health care provider if: °· You have a  fever. °· You have continuous pain in your abdomen. °Get help right away if: °· Your contractions become stronger, more regular, and closer together. °· You have fluid leaking or gushing from your vagina. °· You pass blood-tinged mucus (bloody show). °· You have bleeding from your vagina. °· You have low back pain that you never had before. °· You feel your baby’s head pushing down and causing pelvic pressure. °· Your baby is not moving inside you as much as it used to. °Summary °· Contractions that occur before labor are called Braxton Hicks contractions, false labor, or practice contractions. °· Braxton Hicks contractions are usually shorter, weaker, farther apart, and less regular than true labor contractions. True labor contractions usually become progressively stronger and regular and they become more frequent. °· Manage discomfort from Braxton Hicks contractions by changing position, resting in a warm bath, drinking plenty of water, or practicing deep breathing. °This information is not intended to replace advice given to you by your health care provider. Make sure you discuss any questions you have with your health care provider. °Document Released: 07/29/2005 Document Revised: 06/17/2016 Document Reviewed: 06/17/2016 °Elsevier Interactive Patient Education © 2017 Elsevier Inc. ° °

## 2017-02-20 NOTE — Progress Notes (Signed)
   PRENATAL VISIT NOTE  Subjective:  Frances [name] is a 21 y.o. G1P0 at 563w6d being seen today for ongoing prenatal care.  She is currently monitored for the following issues for this low-risk pregnancy and has Supervision of normal pregnancy, antepartum; Rubella non-immune status, antepartum; UTI in pregnancy, antepartum; Healthcare maintenance; Recurrent UTI; Gallstones; and Anemia affecting pregnancy on her problem list.  Patient reports occasional contractions.  Contractions: Irregular. Vag. Bleeding: None.  Movement: Present. Denies leaking of fluid.   The following portions of the patient's history were reviewed and updated as appropriate: allergies, current medications, past family history, past medical history, past social history, past surgical history and problem list. Problem list updated.  Objective:   Vitals:   02/20/17 1008  BP: 135/71  Pulse: 88  Weight: 198 lb 8 oz (90 kg)    Fetal Status: Fetal Heart Rate (bpm): 135 Fundal Height: 39 cm Movement: Present  Presentation: Vertex  General:  Alert, oriented and cooperative. Patient is in no acute distress.  Skin: Skin is warm and dry. No rash noted.   Cardiovascular: Normal heart rate noted  Respiratory: Normal respiratory effort, no problems with respiration noted  Abdomen: Soft, gravid, appropriate for gestational age. Pain/Pressure: Present     Pelvic:  Cervical exam performed Dilation: 1.5 Effacement (%): 80 Station: -2  Extremities: Normal range of motion.  Edema: None  Mental Status: Normal mood and affect. Normal behavior. Normal judgment and thought content.   Assessment and Plan:  Pregnancy: G1P0 at [redacted]w[redacted]d  1. Supervision of normal first pregnancy, antepartum - IOL scheduled for 02/28/17  Term labor symptoms and general obstetric precautions including but not limited to vaginal bleeding, contractions, leaking of fluid and fetal movement were reviewed in detail with the patient. Please refer to After Visit  Summary for other counseling recommendations.  Return in about 5 days (around 02/25/2017) for NST/AFI only.   Vonzella NippleJulie Mckay Brandt, PA-C

## 2017-02-20 NOTE — Telephone Encounter (Signed)
Preadmission screen  

## 2017-02-21 ENCOUNTER — Other Ambulatory Visit: Payer: Self-pay | Admitting: Medical

## 2017-02-25 ENCOUNTER — Ambulatory Visit (INDEPENDENT_AMBULATORY_CARE_PROVIDER_SITE_OTHER): Payer: Medicaid Other | Admitting: Obstetrics and Gynecology

## 2017-02-25 ENCOUNTER — Ambulatory Visit: Payer: Self-pay

## 2017-02-25 VITALS — BP 101/60 | HR 92

## 2017-02-25 DIAGNOSIS — Z3480 Encounter for supervision of other normal pregnancy, unspecified trimester: Secondary | ICD-10-CM

## 2017-02-25 DIAGNOSIS — O480 Post-term pregnancy: Secondary | ICD-10-CM | POA: Diagnosis not present

## 2017-02-25 DIAGNOSIS — Z3483 Encounter for supervision of other normal pregnancy, third trimester: Secondary | ICD-10-CM

## 2017-02-25 NOTE — Progress Notes (Signed)
Prenatal Visit Note Date: 02/25/2017 Clinic: [facility]  Subjective:  Frances [name] is a 21 y.o. G1P0 at 1031w4d being seen today for ongoing prenatal care.  She is currently monitored for the following issues for this low-risk pregnancy and has Supervision of normal pregnancy, antepartum; Rubella non-immune status, antepartum; UTI in pregnancy, antepartum; Healthcare maintenance; Recurrent UTI; Gallstones; and Anemia affecting pregnancy on her problem list.  Patient reports: see RN  .  Marland Kitchen.  Movement: Present. Denies leaking of fluid.   The following portions of the patient's history were reviewed and updated as appropriate: allergies, current medications, past family history, past medical history, past social history, past surgical history and problem list. Problem list updated.  Objective:   Vitals:   02/25/17 1254  BP: 101/60  Pulse: 92    Fetal Status: Fetal Heart Rate (bpm): NST   Movement: Present  Presentation: Vertex  General:  Alert, oriented and cooperative. Patient is in no acute distress.  Skin: Skin is warm and dry. No rash noted.   Cardiovascular: Normal heart rate noted  Respiratory: Normal respiratory effort, no problems with respiration noted  Abdomen: Soft, gravid, appropriate for gestational age. Pain/Pressure: Present     Pelvic:  Cervical exam performed Dilation: 1.5 Effacement (%): 50 Station: -3  EGBUS normal SSE: normal vault, cervix visually closed. Negative cough test  Extremities: Normal range of motion.     Mental Status: Normal mood and affect. Normal behavior. Normal judgment and thought content.   Urinalysis:      Assessment and Plan:  Pregnancy: G1P0 at 6731w4d  1. Post term pregnancy, antepartum condition or complication Routine care. Normal AFI (7.8) and rNST. [facility] set up for Friday.  - Fetal nonstress test - US OB Limited  Preterm labor symptoms and general obstetric precautions including but not  limited to vaginal bleeding, contractions, leaking of fluid and fetal movement were reviewed in detail with the patient. Please refer to After Visit Summary for other counseling recommendations.  Return in about 5 weeks (around 04/01/2017) for PP visit.  IOL on 7/20.Marland Kitchen.   Lane BingPickens, Terryn Redner, MD

## 2017-02-25 NOTE — Progress Notes (Signed)
Pt informed that the ultrasound is considered a limited OB ultrasound and is not intended to be a complete ultrasound exam.  Patient also informed that the ultrasound is not being completed with the intent of assessing for fetal or placental anomalies or any pelvic abnormalities.  Explained that the purpose of today's ultrasound is to assess for presentation and amniotic fluid volume.  Patient acknowledges the purpose of the exam and the limitations of the study.    During performance of AFI, pt reported having leakage of fluid 2 days ago and several times since - mostly when she needs to urinate.  AFV subjectively low.  Pt taken to exam room for further evaluation by provider.

## 2017-02-26 ENCOUNTER — Encounter: Payer: Self-pay | Admitting: General Practice

## 2017-02-28 ENCOUNTER — Encounter (HOSPITAL_COMMUNITY): Payer: Self-pay

## 2017-02-28 ENCOUNTER — Inpatient Hospital Stay (HOSPITAL_COMMUNITY): Payer: Medicaid Other | Admitting: Anesthesiology

## 2017-02-28 ENCOUNTER — Inpatient Hospital Stay (HOSPITAL_COMMUNITY)
Admission: RE | Admit: 2017-02-28 | Discharge: 2017-03-01 | DRG: 775 | Disposition: A | Discharge status: Home / Self Care | Payer: Medicaid Other | Source: Ambulatory Visit | Attending: Obstetrics & Gynecology | Admitting: Obstetrics & Gynecology

## 2017-02-28 ENCOUNTER — Telehealth: Payer: Self-pay | Admitting: Family Medicine

## 2017-02-28 DIAGNOSIS — D649 Anemia, unspecified: Secondary | ICD-10-CM | POA: Diagnosis present

## 2017-02-28 DIAGNOSIS — O9902 Anemia complicating childbirth: Secondary | ICD-10-CM | POA: Diagnosis present

## 2017-02-28 DIAGNOSIS — Z3A41 41 weeks gestation of pregnancy: Secondary | ICD-10-CM

## 2017-02-28 DIAGNOSIS — O480 Post-term pregnancy: Secondary | ICD-10-CM | POA: Diagnosis present

## 2017-02-28 DIAGNOSIS — Z3400 Encounter for supervision of normal first pregnancy, unspecified trimester: Secondary | ICD-10-CM

## 2017-02-28 HISTORY — DX: Anemia, unspecified: D64.9

## 2017-02-28 HISTORY — DX: Unspecified convulsions: R56.9

## 2017-02-28 LAB — CBC
HEMATOCRIT: 37.8 % (ref 36.0–46.0)
Hemoglobin: 13.2 g/dL (ref 12.0–15.0)
MCH: 30.1 pg (ref 26.0–34.0)
MCHC: 34.9 g/dL (ref 30.0–36.0)
MCV: 86.3 fL (ref 78.0–100.0)
Platelets: 252 10*3/uL (ref 150–400)
RBC: 4.38 MIL/uL (ref 3.87–5.11)
RDW: 13 % (ref 11.5–15.5)
WBC: 6.2 10*3/uL (ref 4.0–10.5)

## 2017-02-28 LAB — ABO/RH: ABO/RH(D): A POS

## 2017-02-28 LAB — TYPE AND SCREEN
ABO/RH(D): A POS
Antibody Screen: NEGATIVE

## 2017-02-28 MED ORDER — DIBUCAINE 1 % RE OINT: 1.0000 "application " | TOPICAL_OINTMENT | RECTAL | Status: DC | PRN

## 2017-02-28 MED ORDER — PRENATAL MULTIVITAMIN CH
1.0000 | ORAL_TABLET | Freq: Every day | ORAL | Status: DC
  Administered 2017-03-01: 1 via ORAL
  Filled 2017-02-28: qty 1

## 2017-02-28 MED ORDER — FENTANYL 2.5 MCG/ML BUPIVACAINE 1/10 % EPIDURAL INFUSION (WH - ANES)
14.0000 mL/h | INTRAMUSCULAR | Status: DC | PRN
  Administered 2017-02-28: 14 mL/h via EPIDURAL
  Filled 2017-02-28: qty 100

## 2017-02-28 MED ORDER — ZOLPIDEM TARTRATE 5 MG PO TABS: 5.0000 mg | ORAL_TABLET | Freq: Every evening | ORAL | Status: DC | PRN

## 2017-02-28 MED ORDER — SENNOSIDES-DOCUSATE SODIUM 8.6-50 MG PO TABS
2.0000 | ORAL_TABLET | ORAL | Status: DC
  Administered 2017-03-01: 2 via ORAL
  Filled 2017-02-28: qty 2

## 2017-02-28 MED ORDER — SOD CITRATE-CITRIC ACID 500-334 MG/5ML PO SOLN: 30.0000 mL | ORAL | Status: DC | PRN

## 2017-02-28 MED ORDER — EPHEDRINE 5 MG/ML INJ
10.0000 mg | INTRAVENOUS | Status: DC | PRN
  Filled 2017-02-28: qty 2

## 2017-02-28 MED ORDER — TERBUTALINE SULFATE 1 MG/ML IJ SOLN
0.2500 mg | Freq: Once | INTRAMUSCULAR | Status: DC | PRN
  Filled 2017-02-28: qty 1

## 2017-02-28 MED ORDER — PHENYLEPHRINE 40 MCG/ML (10ML) SYRINGE FOR IV PUSH (FOR BLOOD PRESSURE SUPPORT)
80.0000 ug | PREFILLED_SYRINGE | INTRAVENOUS | Status: DC | PRN
  Filled 2017-02-28: qty 10
  Filled 2017-02-28: qty 5

## 2017-02-28 MED ORDER — WITCH HAZEL-GLYCERIN EX PADS: 1.0000 "application " | MEDICATED_PAD | CUTANEOUS | Status: DC | PRN

## 2017-02-28 MED ORDER — LIDOCAINE HCL (PF) 1 % IJ SOLN
INTRAMUSCULAR | Status: DC | PRN
  Administered 2017-02-28 (×2): 5 mL via EPIDURAL

## 2017-02-28 MED ORDER — ONDANSETRON HCL 4 MG/2ML IJ SOLN: 4.0000 mg | Freq: Four times a day (QID) | INTRAMUSCULAR | Status: DC | PRN

## 2017-02-28 MED ORDER — LACTATED RINGERS IV SOLN: 500.0000 mL | INTRAVENOUS | Status: DC | PRN

## 2017-02-28 MED ORDER — FENTANYL CITRATE (PF) 100 MCG/2ML IJ SOLN
100.0000 ug | INTRAMUSCULAR | Status: DC | PRN
  Administered 2017-02-28 (×2): 100 ug via INTRAVENOUS
  Filled 2017-02-28 (×2): qty 2

## 2017-02-28 MED ORDER — MISOPROSTOL 200 MCG PO TABS
ORAL_TABLET | ORAL | Status: AC
  Filled 2017-02-28: qty 1

## 2017-02-28 MED ORDER — PHENYLEPHRINE 40 MCG/ML (10ML) SYRINGE FOR IV PUSH (FOR BLOOD PRESSURE SUPPORT)
80.0000 ug | PREFILLED_SYRINGE | INTRAVENOUS | Status: DC | PRN
  Filled 2017-02-28: qty 5

## 2017-02-28 MED ORDER — OXYCODONE-ACETAMINOPHEN 5-325 MG PO TABS: 2.0000 | ORAL_TABLET | ORAL | Status: DC | PRN

## 2017-02-28 MED ORDER — LACTATED RINGERS IV SOLN
500.0000 mL | Freq: Once | INTRAVENOUS | Status: AC
  Administered 2017-02-28: 1000 mL via INTRAVENOUS

## 2017-02-28 MED ORDER — ACETAMINOPHEN 325 MG PO TABS: 650.0000 mg | ORAL_TABLET | ORAL | Status: DC | PRN

## 2017-02-28 MED ORDER — IBUPROFEN 600 MG PO TABS
600.0000 mg | ORAL_TABLET | Freq: Four times a day (QID) | ORAL | Status: DC
  Administered 2017-02-28 – 2017-03-01 (×4): 600 mg via ORAL
  Filled 2017-02-28 (×4): qty 1

## 2017-02-28 MED ORDER — LIDOCAINE HCL (PF) 1 % IJ SOLN
30.0000 mL | INTRAMUSCULAR | Status: DC | PRN
  Filled 2017-02-28: qty 30

## 2017-02-28 MED ORDER — DIPHENHYDRAMINE HCL 25 MG PO CAPS: 25.0000 mg | ORAL_CAPSULE | Freq: Four times a day (QID) | ORAL | Status: DC | PRN

## 2017-02-28 MED ORDER — ONDANSETRON HCL 4 MG/2ML IJ SOLN: 4.0000 mg | INTRAMUSCULAR | Status: DC | PRN

## 2017-02-28 MED ORDER — OXYTOCIN 40 UNITS IN LACTATED RINGERS INFUSION - SIMPLE MED
1.0000 m[IU]/min | INTRAVENOUS | Status: DC
  Administered 2017-02-28: 2 m[IU]/min via INTRAVENOUS
  Filled 2017-02-28: qty 1000

## 2017-02-28 MED ORDER — OXYTOCIN BOLUS FROM INFUSION
500.0000 mL | Freq: Once | INTRAVENOUS | Status: AC
  Administered 2017-02-28: 500 mL via INTRAVENOUS

## 2017-02-28 MED ORDER — BENZOCAINE-MENTHOL 20-0.5 % EX AERO
1.0000 "application " | INHALATION_SPRAY | CUTANEOUS | Status: DC | PRN
  Administered 2017-03-01: 1 via TOPICAL
  Filled 2017-02-28: qty 56

## 2017-02-28 MED ORDER — OXYTOCIN 40 UNITS IN LACTATED RINGERS INFUSION - SIMPLE MED
2.5000 [IU]/h | INTRAVENOUS | Status: DC
  Administered 2017-02-28: 2.5 [IU]/h via INTRAVENOUS
  Filled 2017-02-28: qty 1000

## 2017-02-28 MED ORDER — DIPHENHYDRAMINE HCL 50 MG/ML IJ SOLN: 12.5000 mg | INTRAMUSCULAR | Status: DC | PRN

## 2017-02-28 MED ORDER — MISOPROSTOL 200 MCG PO TABS
ORAL_TABLET | ORAL | Status: AC
  Administered 2017-02-28: 800 ug via VAGINAL
  Filled 2017-02-28: qty 4

## 2017-02-28 MED ORDER — TETANUS-DIPHTH-ACELL PERTUSSIS 5-2.5-18.5 LF-MCG/0.5 IM SUSP: 0.5000 mL | Freq: Once | INTRAMUSCULAR | Status: DC

## 2017-02-28 MED ORDER — MISOPROSTOL 50MCG HALF TABLET
50.0000 ug | ORAL_TABLET | ORAL | Status: DC
Start: 2017-02-28 — End: 2017-02-28

## 2017-02-28 MED ORDER — SIMETHICONE 80 MG PO CHEW: 80.0000 mg | CHEWABLE_TABLET | ORAL | Status: DC | PRN

## 2017-02-28 MED ORDER — OXYCODONE-ACETAMINOPHEN 5-325 MG PO TABS: 1.0000 | ORAL_TABLET | ORAL | Status: DC | PRN

## 2017-02-28 MED ORDER — FLEET ENEMA 7-19 GM/118ML RE ENEM: 1.0000 | ENEMA | RECTAL | Status: DC | PRN

## 2017-02-28 MED ORDER — COCONUT OIL OIL
1.0000 "application " | TOPICAL_OIL | Status: DC | PRN
  Administered 2017-03-01: 1 via TOPICAL
  Filled 2017-02-28: qty 120

## 2017-02-28 MED ORDER — ONDANSETRON HCL 4 MG PO TABS: 4.0000 mg | ORAL_TABLET | ORAL | Status: DC | PRN

## 2017-02-28 MED ORDER — LACTATED RINGERS IV SOLN
INTRAVENOUS | Status: DC
  Administered 2017-02-28 (×2): via INTRAVENOUS

## 2017-02-28 MED ORDER — MISOPROSTOL 200 MCG PO TABS
800.0000 ug | ORAL_TABLET | Freq: Once | ORAL | Status: AC
  Administered 2017-02-28: 800 ug via VAGINAL

## 2017-02-28 NOTE — Anesthesia Procedure Notes (Signed)
Epidural Patient location during procedure: OB Start time: 02/28/2017 12:44 PM End time: 02/28/2017 1:58 PM  Staffing Anesthesiologist: Heather RobertsSINGER, Frances [name] Performed: anesthesiologist   Preanesthetic Checklist Completed: patient identified, site marked, pre-op evaluation, timeout performed, IV checked, risks and benefits discussed and monitors and equipment checked  Epidural Patient position: sitting Prep: DuraPrep Patient monitoring: heart rate, cardiac monitor, continuous pulse ox and blood pressure Approach: midline Location: L2-L3 Injection technique: LOR saline  Needle:  Needle type: Tuohy  Needle gauge: 17 G Needle length: 9 cm Needle insertion depth: 8 cm Catheter size: 20 Guage Catheter at skin depth: 13 cm Test dose: negative and Other  Assessment Events: blood not aspirated, injection not painful, no injection resistance and negative IV test  Additional Notes Informed consent obtained prior to proceeding including risk of failure, 1% risk of PDPH, risk of minor discomfort and bruising.  Discussed rare but serious complications including epidural abscess, permanent nerve injury, epidural hematoma.  Discussed alternatives to epidural analgesia and patient desires to proceed.  Timeout performed pre-procedure verifying patient name, procedure, and platelet count.  Patient tolerated procedure well.

## 2017-02-28 NOTE — Anesthesia Postprocedure Evaluation (Signed)
Anesthesia Post Note  Patient: Frances [name]  Procedure(s) Performed: * No procedures listed *     Patient location during evaluation: Mother Baby Anesthesia Type: Epidural Level of consciousness: awake and alert and oriented Pain management: pain level controlled Vital Signs Assessment: post-procedure vital signs reviewed and stable Respiratory status: spontaneous breathing and nonlabored ventilation Cardiovascular status: stable Postop Assessment: no headache, patient able to bend at knees, no backache, no signs of nausea or vomiting, epidural receding and adequate PO intake Anesthetic complications: no    Last Vitals:  Vitals:   02/28/17 1731 02/28/17 1822  BP: 123/72 (!) 128/99  Pulse: (!) 101 (!) 106  Resp: 20 19  Temp: 37.2 C 37.2 C    Last Pain:  Vitals:   02/28/17 1822  TempSrc: Oral  PainSc: 0-No pain   Pain Goal: Patients Stated Pain Goal: 3 (02/28/17 0730)               Laban EmperorMalinova,Reagan Klemz Hristova

## 2017-02-28 NOTE — Anesthesia Pain Management Evaluation Note (Signed)
  [facility] Pain Management Visit Note  Patient: Frances [name], 21 y.o., female  "[facility] We have an anesthesia team available at all times to provide care throughout the hospital, including epidural management and anesthesia for C-section. I don't know your plan for the delivery whether it a natural birth, water birth, IV sedation, nitrous supplementation, doula or epidural, but we want to meet your pain goals."   1.Was your pain managed to your expectations on prior hospitalizations?   No prior hospitalizations  2.What is your expectation for pain management during this hospitalization?     IV pain meds  3.How can we help you reach that goal? Epidural if IV pain meds are not sufficient  Record the patient's initial score and the patient's pain goal.   Pain: 9  Pain Goal: 4 [facility] wants you to be able to say your pain was always managed very well.  Anh Mangano 02/28/2017

## 2017-02-28 NOTE — Telephone Encounter (Signed)
Error

## 2017-02-28 NOTE — Anesthesia Preprocedure Evaluation (Signed)
Anesthesia Evaluation  Patient identified by MRN, date of birth, ID band Patient awake    Reviewed: Allergy & Precautions, NPO status , Patient's Chart, lab work & pertinent test results  Airway Mallampati: II  TM Distance: >3 FB Neck ROM: Full    Dental no notable dental hx. (+) Dental Advisory Given   Pulmonary asthma ,    Pulmonary exam normal        Cardiovascular negative cardio ROS Normal cardiovascular exam     Neuro/Psych negative neurological ROS  negative psych ROS   GI/Hepatic negative GI ROS, Neg liver ROS,   Endo/Other  negative endocrine ROS  Renal/GU negative Renal ROS  negative genitourinary   Musculoskeletal negative musculoskeletal ROS (+)   Abdominal   Peds negative pediatric ROS (+)  Hematology negative hematology ROS (+)   Anesthesia Other Findings   Reproductive/Obstetrics (+) Pregnancy                             Anesthesia Physical Anesthesia Plan  ASA: II  Anesthesia Plan: Epidural   Post-op Pain Management:    Induction:   PONV Risk Score and Plan:   Airway Management Planned: Natural Airway  Additional Equipment:   Intra-op Plan:   Post-operative Plan:   Informed Consent: I have reviewed the patients History and Physical, chart, labs and discussed the procedure including the risks, benefits and alternatives for the proposed anesthesia with the patient or authorized representative who has indicated his/her understanding and acceptance.     Dental advisory given  Plan Discussed with: Anesthesiologist  Anesthesia Plan Comments:         Anesthesia Quick Evaluation  

## 2017-02-28 NOTE — H&P (Signed)
Frances [name] is a 21 y.o. female G1P0 with IUP at [redacted]w[redacted]d presenting for IOL for postdates. [facility] at [facility] since 9 wks  Has been having some mild ctx since last night  Prenatal History/Complications:  none   Past Medical History: Past Medical History:  Diagnosis Date  . Asthma 1999   as a child  . Chronic kidney disease   . Constipation   . Gall stones   . UTI (lower urinary tract infection)     Past Surgical History: Past Surgical History:  Procedure Laterality Date  . TYMPANOSTOMY TUBE PLACEMENT      Obstetrical History: OB History    Gravida Para Term Preterm AB Living   1             SAB TAB Ectopic Multiple Live Births                  Social History: Social History   Social History  . Marital status: Single    Spouse name: N/A  . Number of children: N/A  . Years of education: N/A   Social History Main Topics  . Smoking status: Never Smoker  . Smokeless tobacco: Never Used  . Alcohol use No     Comment: occ  . Drug use: No  . Sexual activity: Yes    Birth control/ protection: None   Other Topics Concern  . Not on file   Social History Narrative  . No narrative on file    Family History: Family History  Problem Relation Age of Onset  . Asthma Mother   . Hypertension Mother   . Asthma Sister   . Birth defects Sister     Allergies: No Known Allergies  Prescriptions Prior to Admission  Medication Sig Dispense Refill Last Dose  . docusate sodium (COLACE) 100 MG capsule Take 1 capsule (100 mg total) by mouth 2 (two) times daily as needed. (Patient not taking: Reported on 02/20/2017) 30 capsule 2 Not Taking  . ferrous gluconate (FERGON) 324 MG tablet Take 1 tablet (324 mg total) by mouth 2 (two) times daily with a meal. 60 tablet 3 Taking  . metoCLOPramide (REGLAN) 10 MG tablet Take 1 tablet (10 mg total) by mouth every 6 (six) hours as needed for nausea. (Patient not taking: Reported on 02/20/2017) 20 tablet 0 Not Taking  . nitrofurantoin,  macrocrystal-monohydrate, (MACROBID) 100 MG capsule One tab po bid (take 2nd dose qhs so it can sit in your bladder overnight) x 10d and then qhs for the rest of pregnancy 40 capsule 4 Taking  . oxyCODONE (OXY IR/ROXICODONE) 5 MG immediate release tablet Take 1 tablet (5 mg total) by mouth every 6 (six) hours as needed for severe pain. 20 tablet 0 Taking  . Prenatal Vit-Fe Fumarate-FA (PRENATAL COMPLETE) 14-0.4 MG TABS Take 1 tablet by mouth daily. 60 each 0 Taking        Review of Systems   Constitutional: Negative for fever and chills Eyes: Negative for visual disturbances Respiratory: Negative for shortness of breath, dyspnea Cardiovascular: Negative for chest pain or palpitations  Gastrointestinal: Negative for abdominal pain, vomiting, diarrhea and constipation.   Genitourinary: Negative for dysuria and urgency Musculoskeletal: Negative for back pain, joint pain, myalgias  Neurological: Negative for dizziness and headaches      Blood pressure 114/85, pulse 100, temperature 99 F (37.2 C), temperature source Oral, resp. rate 20, last menstrual period 05/17/2016. General appearance: alert, cooperative and no distress Lungs: clear to auscultation bilaterally Heart: regular  rate and rhythm Abdomen: soft, non-tender; bowel sounds normal Extremities: Homans sign is negative, no sign of DVT DTR's 2+ Presentation: cephalic Fetal monitoring  Baseline: 150 bpm, Variability: Good {> 6 bpm), Accelerations: Reactive and Decelerations: Absent Uterine activity  Rare/  Dilation: 4 Effacement (%): 90 Station: -2 Exam by:: Drenda FreezeFran    Prenatal labs: ABO, Rh: A/POS/-- (12/07 1537) Antibody: NEG (12/07 1537) Rubella: immune RPR: Non Reactive (04/30 1307)  HBsAg: NEGATIVE (12/07 1537)  HIV: NONREACTIVE (12/07 1537)    [facility]  Prenatal Labs  Dating 1st trimester US Blood type: A/POS/-- (12/07 1537)   Genetic Screen 1 Screen: nml   AFP:     Quad:     NIPS: Antibody:NEG (12/07  1537)  Anatomic US wnl  Rubella: 0.96 (12/07 1537)  GTT Early:               Third trimester: 69/107/74 - nml RPR: NON REAC (12/07 1537)   Flu vaccine 07/18/16 HBsAg: NEGATIVE (12/07 1537)   TDaP vaccine      11/21/16                                Rhogam:n/a HIV: NONREACTIVE (12/07 1537)   Baby Food  Breast                                          GBS: (For PCN allergy, check sensitivities) negative  Contraception  Considering in hospital nexplanon Pap: n/a  Circumcision girl Hgb Electrophoresis neg  [facility]   Support Person  Mom - Lawanna Kobusngel     No results found for this or any previous visit (from the past 24 hour(s)).  Assessment: Frances [name] is a 21 y.o. G1P0 with an IUP at 622w0d presenting for IOL for postdates.  Plan: #Labor:pitocin #Pain:  Per request #FWB Cat 1   CRESENZO-DISHMAN,Iveth Heidemann 02/28/2017, 7:39 AM

## 2017-02-28 NOTE — Progress Notes (Signed)
LABOR PROGRESS NOTE  Frances [name] is a 21 y.o. G1P0000 at 2180w0d  admitted for PDIOL  Subjective: Pt is clearly uncomfortable. She reports feeling very painful, and that the second dose of fentanyl has not helped. She now wants to consider an epidural. Nurse and mom report bloody show 20 minutes ago with water breakage. Pt is nonconversant due to pain.   Objective: BP (!) 146/92 Comment: pt crying, moving during u/c.  Pulse 88   Temp 98.4 F (36.9 C) (Oral)   Resp 20   Ht 5\' 7"  (1.702 m)   Wt 88.9 kg (196 lb)   LMP 05/17/2016 (Exact Date)   BMI 30.70 kg/m  or  Vitals:   02/28/17 1130 02/28/17 1133 02/28/17 1200 02/28/17 1230  BP: (!) 128/98 128/72 (!) 146/92   Pulse: (!) 129 (!) 106 88   Resp: 20  18 20   Temp:      TempSrc:      Weight:      Height:        FHR: base line 120 with moderate variability and no accelerations or decelerations   Dilation: 5 Effacement (%): 90 Cervical Position: Posterior Station: 0 Presentation: Vertex Exam by:: Cathie BeamsFran Cresenzo-Dishmon, CNM  Labs: Lab Results  Component Value Date   WBC 6.2 02/28/2017   HGB 13.2 02/28/2017   HCT 37.8 02/28/2017   MCV 86.3 02/28/2017   PLT 252 02/28/2017    Patient Active Problem List   Diagnosis Date Noted  . Post-dates pregnancy 02/28/2017  . Anemia affecting pregnancy 12/24/2016  . Gallstones 10/23/2016  . Healthcare maintenance 09/16/2016  . Recurrent UTI 09/16/2016  . Rubella non-immune status, antepartum 07/20/2016  . UTI in pregnancy, antepartum 07/20/2016  . Supervision of normal pregnancy, antepartum 07/18/2016    Assessment / Plan: 21 y.o. G1P0000 at 6080w0d here for PDIOL  Labor: latent stage 1 Fetal Wellbeing:  Category II, currently not reactive. Continue to monitor  Pain Control:  Epidural  Anticipated MOD:  Vaginal   Sullivan LoneBrannon L Angelic Schnelle, MS3 02/28/2017, 12:35 PM

## 2017-03-01 ENCOUNTER — Ambulatory Visit: Payer: Self-pay

## 2017-03-01 LAB — RPR: RPR Ser Ql: NONREACTIVE

## 2017-03-01 MED ORDER — IBUPROFEN 600 MG PO TABS: 600.0000 mg | ORAL_TABLET | Freq: Four times a day (QID) | ORAL | 0 refills | Status: DC

## 2017-03-01 NOTE — Discharge Summary (Signed)
OB Discharge Summary     Patient Name: Frances [name] DOB: 01/12/1996 MRN: [id number]  Date of admission: 02/28/2017 Delivering MD: Jen MowMUMAW, [facility]   Date of discharge: 03/01/2017  Admitting diagnosis: induction Intrauterine pregnancy: 4253w0d     Secondary diagnosis:  Active Problems:   Post-dates pregnancy  Additional problems:  Patient Active Problem List   Diagnosis Date Noted  . Post-dates pregnancy 02/28/2017  . Anemia affecting pregnancy 12/24/2016  . Gallstones 10/23/2016  . Healthcare maintenance 09/16/2016  . Recurrent UTI 09/16/2016  . Rubella non-immune status, antepartum 07/20/2016  . UTI in pregnancy, antepartum 07/20/2016  . Supervision of normal pregnancy, antepartum 07/18/2016        Discharge diagnosis: Term Pregnancy Delivered                                                                                                Post partum procedures:none  Augmentation: Pitocin  Complications: None  Hospital course:  Induction of Labor With Vaginal Delivery   21 y.o. yo G1P1001 at 6353w0d was admitted to the hospital 02/28/2017 for induction of labor.  Indication for induction: Postdates.  Patient had an uncomplicated labor course as follows: Membrane Rupture Time/Date: 1:59 PM ,02/28/2017   Intrapartum Procedures: Episiotomy: None [1]                                         Lacerations:  2nd degree [3];Perineal [11];Periurethral [8];Sulcus [9]  Patient had delivery of a Viable infant.  Information for the patient's newborn:  Timoteo GaulCooper, Girl Gregg [[id number]][[id number]]  Delivery Method: Vaginal, Spontaneous Delivery (Filed from Delivery Summary)   02/28/2017  Details of delivery can be found in separate delivery note. Sulcus laceration continued to bleed despite multiple figure-eight sutures. Vaginal packing with a single small lap sponge soaked with sterile saline and betadine, to stay in for 12 hours and then removed. Patient had a routine postpartum course.  Patient is discharged home 03/01/17.  Physical exam  Vitals:   02/28/17 1822 02/28/17 2000 03/01/17 0014 03/01/17 0601  BP: (!) 128/99 131/75 119/70 (!) 109/58  Pulse: (!) 106 100 92 83  Resp: 19 18 16 16   Temp: 99 F (37.2 C) 98.9 F (37.2 C) 98.9 F (37.2 C) 98.5 F (36.9 C)  TempSrc: Oral Oral Oral Oral  SpO2:   100% 100%  Weight:      Height:       General: alert, cooperative and no distress Lochia: appropriate Uterine Fundus: firm Incision: N/A DVT Evaluation: No evidence of DVT seen on physical exam. Negative Homan's sign. No significant calf/ankle edema. Labs: Lab Results  Component Value Date   WBC 6.2 02/28/2017   HGB 13.2 02/28/2017   HCT 37.8 02/28/2017   MCV 86.3 02/28/2017   PLT 252 02/28/2017   CMP Latest Ref Rng & Units 10/15/2016  Glucose 65 - 99 mg/dL 93  BUN 6 - 20 mg/dL 6  Creatinine 7.840.44 - 6.961.00 mg/dL 2.95(M0.42(L)  Sodium 841135 - 324145 mmol/L 133(L)  Potassium 3.5 -  5.1 mmol/L 3.4(L)  Chloride 101 - 111 mmol/L 104  CO2 22 - 32 mmol/L 22  Calcium 8.9 - 10.3 mg/dL 4.0(J)  Total Protein 6.5 - 8.1 g/dL 6.3(L)  Total Bilirubin 0.3 - 1.2 mg/dL 8.1(X)  Alkaline Phos 38 - 126 U/L 48  AST 15 - 41 U/L 19  ALT 14 - 54 U/L 14    Discharge instruction: per After Visit Summary and "Baby and Me Booklet".  After visit meds:  Allergies as of 03/01/2017   No Known Allergies     Medication List    STOP taking these medications   metoCLOPramide 10 MG tablet Commonly known as:  REGLAN   nitrofurantoin (macrocrystal-monohydrate) 100 MG capsule Commonly known as:  MACROBID   oxyCODONE 5 MG immediate release tablet Commonly known as:  Oxy IR/ROXICODONE     TAKE these medications   docusate sodium 100 MG capsule Commonly known as:  COLACE Take 1 capsule (100 mg total) by mouth 2 (two) times daily as needed.   ferrous gluconate 324 MG tablet Commonly known as:  FERGON Take 1 tablet (324 mg total) by mouth 2 (two) times daily with a meal.   ibuprofen 600 MG  tablet Commonly known as:  ADVIL,MOTRIN Take 1 tablet (600 mg total) by mouth every 6 (six) hours.   PRENATAL COMPLETE 14-0.4 MG Tabs Take 1 tablet by mouth daily.       Diet: routine diet  Activity: Advance as tolerated. Pelvic rest for 6 weeks.   Outpatient follow up:4-6 weeks Follow up Appt:No future appointments. Follow up Visit:No Follow-up on file.  Postpartum contraception: Undecided  Newborn Data: Live born female  Birth Weight: 8 lb 1.8 oz (3680 g) APGAR: 9, 9  Baby Feeding: Bottle and Breast Disposition:home with mother   03/01/2017 [place], DO   OB FELLOW DISCHARGE ATTESTATION  I have seen and examined this patient and agree with above documentation in the resident's note.   Frederik Pear, MD OB Fellow 9:14 AM

## 2017-03-01 NOTE — Lactation Note (Signed)
This note was copied from a baby's chart. Lactation Consultation Note  Patient Name: Girl Racheal PatchesCaaliegh Ptacek WUXLK'GToday's Date: 03/01/2017 Reason for consult: Initial assessment Infant is 9619 hours old & seen by Lactation for Initial assessment. The photographer came & told LC that mom was trying to latch and wanted help. When LC entered, mom was giving baby formula via a bottle. LC had been told previously from RN that mom wanted to just do the bottle but [facility] she stated she has been trying to BF but it has not gone well. Suggested mom stop giving the bottle at that moment so we could work on BF. Mom has large breasts with a flat, almost non-apparent nipple and the tissue around her nipple is not very compressible. Showed mom how to do hand expression but no milk was seen & mom was having a difficult time with the technique so will need more reinforcement later. Tried latching baby in cross-cradle hold on mom's right breast but due to her flat nipple, baby would not open to latch. Suggested we try a nipple shield. When LC brought it back in the room, a size 16mm nipple shield was seen on the computer stand- mom reports someone tried with her last night but that it did not work. Tried the size 20mm Nipple shield -baby latched and suckled but came on & off several times and no milk was seen in the nipple shield after BF. Mom needed a lot of assistance with positioning and latching but was able to latch unassisted a few times. Showed what to look for with a good latch. Discussed plan of BF 8-12x in 24 hrs with the nipple shield and then post pump 4-6x in 24 hrs for 10-15 mins and supplement baby with either pumped milk or formula. Mom agreed to plan but LC is unsure of commitment.  Also provided mom with breast shells - showed mom how to use & clean them. Encouraged mom to wear them for ~20 mins before BF to help draw her nipples out.  Provided BF booklet, BF resources, & feeding log; mom made aware of  O/P services, breastfeeding support groups, community resources, and our phone # for post-discharge questions.  Mom reports no questions at this time. Encouraged mom to ask for assistance as needed.   Maternal Data    Feeding Feeding Type: Bottle Fed - Formula Nipple Type: Slow - flow Length of feed:  (on & off, no milk in nipple shield after suckling)  LATCH Score/Interventions Latch: Repeated attempts needed to sustain latch, nipple held in mouth throughout feeding, stimulation needed to elicit sucking reflex.  Audible Swallowing: None Intervention(s): Hand expression;Skin to skin  Type of Nipple: Flat Intervention(s): Double electric pump  Comfort (Breast/Nipple): Soft / non-tender     Hold (Positioning): Full assist, staff holds infant at breast Intervention(s): Support Pillows;Breastfeeding basics reviewed;Skin to skin  LATCH Score: 4  Lactation Tools Discussed/Used Tools: Nipple Shields Nipple shield size: 20 WIC Program: Yes   Consult Status Consult Status: Follow-up Date: 03/02/17 Follow-up type: In-patient    Oneal GroutLaura C Donneisha Beane 03/01/2017, 12:33 PM

## 2017-03-01 NOTE — Discharge Instructions (Signed)

## 2017-03-01 NOTE — Progress Notes (Signed)
Present for vaginal packing removal by Dr Nira Retortegele. No trickling, excess bleeding noted. Sherald BargeMatthews, Saraiyah Hemminger L

## 2017-03-06 NOTE — Progress Notes (Signed)
Post discharge chart review completed.  

## 2017-03-21 ENCOUNTER — Encounter: Payer: Self-pay | Admitting: General Practice

## 2017-04-09 ENCOUNTER — Ambulatory Visit: Payer: Medicaid Other | Admitting: Family Medicine

## 2017-04-15 ENCOUNTER — Encounter: Payer: Self-pay | Admitting: Advanced Practice Midwife

## 2017-04-15 ENCOUNTER — Ambulatory Visit (INDEPENDENT_AMBULATORY_CARE_PROVIDER_SITE_OTHER): Payer: Medicaid Other | Admitting: Advanced Practice Midwife

## 2017-04-15 DIAGNOSIS — Z3009 Encounter for other general counseling and advice on contraception: Secondary | ICD-10-CM

## 2017-04-15 NOTE — Progress Notes (Signed)
Subjective:     Frances [name] is a 21 y.o. female who presents for a postpartum visit. She is 6 weeks postpartum following a spontaneous vaginal delivery. I have fully reviewed the prenatal and intrapartum course. The delivery was at 41 gestational weeks. Outcome: spontaneous vaginal delivery. Anesthesia: epidural. Postpartum course has been normal. Baby's course has been normal. Baby is feeding by bottle - Similac Advance. Bleeding lochia stopped 4 weeks ago, period started yesterday. Bowel function is normal. Bladder function is normal. Patient is not sexually active. Contraception method is none. Postpartum depression screening: negative.  The following portions of the patient's history were reviewed and updated as appropriate: allergies, current medications, past family history, past medical history, past social history, past surgical history and problem list.  Review of Systems Pertinent items noted in HPI and remainder of comprehensive ROS otherwise negative.   Objective:    BP 96/74   Pulse 89   Ht 5\' 7"  (1.702 m)   Breastfeeding? No   VS reviewed, nursing note reviewed,  Constitutional: well developed, well nourished, no distress HEENT: normocephalic CV: normal rate Pulm/chest wall: normal effort Abdomen: soft Neuro: alert and oriented x 3 Skin: warm, dry Psych: affect normal  Pelvic exam deferred  Assessment:   1. Postpartum examination following vaginal delivery   2. Encounter for general counseling and advice on contraceptive management --Discussed LARCs as most effective forms of birth control.  Discussed benefits/risks of other methods.  Pt does not desire hormonal contraception.  Is not sexually active at this time.  Recommend condoms and to call office if contraception is desired in the future.  Pt states understanding.    Plan:    1. Contraception: none  2. Follow up in: 1 year or as needed.

## 2017-06-10 ENCOUNTER — Encounter (HOSPITAL_COMMUNITY): Payer: Self-pay

## 2017-06-10 ENCOUNTER — Emergency Department (HOSPITAL_COMMUNITY): Payer: Medicaid Other

## 2017-06-10 ENCOUNTER — Inpatient Hospital Stay (HOSPITAL_COMMUNITY)
Admission: EM | Admit: 2017-06-10 | Discharge: 2017-06-17 | DRG: 571 | Disposition: A | Discharge status: Home Health | Payer: Medicaid Other | Attending: General Surgery | Admitting: General Surgery

## 2017-06-10 DIAGNOSIS — B9562 Methicillin resistant Staphylococcus aureus infection as the cause of diseases classified elsewhere: Secondary | ICD-10-CM | POA: Diagnosis present

## 2017-06-10 DIAGNOSIS — T368X5A Adverse effect of other systemic antibiotics, initial encounter: Secondary | ICD-10-CM | POA: Diagnosis not present

## 2017-06-10 DIAGNOSIS — D509 Iron deficiency anemia, unspecified: Secondary | ICD-10-CM | POA: Diagnosis present

## 2017-06-10 DIAGNOSIS — B9620 Unspecified Escherichia coli [E. coli] as the cause of diseases classified elsewhere: Secondary | ICD-10-CM | POA: Diagnosis present

## 2017-06-10 DIAGNOSIS — L03311 Cellulitis of abdominal wall: Secondary | ICD-10-CM | POA: Diagnosis present

## 2017-06-10 DIAGNOSIS — L02211 Cutaneous abscess of abdominal wall: Principal | ICD-10-CM | POA: Diagnosis present

## 2017-06-10 DIAGNOSIS — L02219 Cutaneous abscess of trunk, unspecified: Secondary | ICD-10-CM | POA: Diagnosis present

## 2017-06-10 DIAGNOSIS — Z825 Family history of asthma and other chronic lower respiratory diseases: Secondary | ICD-10-CM

## 2017-06-10 DIAGNOSIS — Z9889 Other specified postprocedural states: Secondary | ICD-10-CM

## 2017-06-10 DIAGNOSIS — Z8249 Family history of ischemic heart disease and other diseases of the circulatory system: Secondary | ICD-10-CM

## 2017-06-10 DIAGNOSIS — N390 Urinary tract infection, site not specified: Secondary | ICD-10-CM | POA: Diagnosis present

## 2017-06-10 DIAGNOSIS — I96 Gangrene, not elsewhere classified: Secondary | ICD-10-CM | POA: Diagnosis present

## 2017-06-10 DIAGNOSIS — E876 Hypokalemia: Secondary | ICD-10-CM | POA: Diagnosis present

## 2017-06-10 DIAGNOSIS — L03319 Cellulitis of trunk, unspecified: Secondary | ICD-10-CM | POA: Diagnosis present

## 2017-06-10 DIAGNOSIS — I959 Hypotension, unspecified: Secondary | ICD-10-CM | POA: Diagnosis present

## 2017-06-10 DIAGNOSIS — N179 Acute kidney failure, unspecified: Secondary | ICD-10-CM | POA: Diagnosis not present

## 2017-06-10 LAB — CBC WITH DIFFERENTIAL/PLATELET
BASOS ABS: 0 10*3/uL (ref 0.0–0.1)
Basophils Relative: 0 %
EOS ABS: 0 10*3/uL (ref 0.0–0.7)
EOS PCT: 0 %
HCT: 31.3 % — ABNORMAL LOW (ref 36.0–46.0)
Hemoglobin: 10.8 g/dL — ABNORMAL LOW (ref 12.0–15.0)
LYMPHS PCT: 12 %
Lymphs Abs: 1.4 10*3/uL (ref 0.7–4.0)
MCH: 30.3 pg (ref 26.0–34.0)
MCHC: 34.5 g/dL (ref 30.0–36.0)
MCV: 87.9 fL (ref 78.0–100.0)
MONO ABS: 1.6 10*3/uL — AB (ref 0.1–1.0)
Monocytes Relative: 14 %
Neutro Abs: 8.7 10*3/uL — ABNORMAL HIGH (ref 1.7–7.7)
Neutrophils Relative %: 74 %
PLATELETS: 243 10*3/uL (ref 150–400)
RBC: 3.56 MIL/uL — ABNORMAL LOW (ref 3.87–5.11)
RDW: 12.7 % (ref 11.5–15.5)
WBC: 11.8 10*3/uL — AB (ref 4.0–10.5)

## 2017-06-10 LAB — COMPREHENSIVE METABOLIC PANEL
ALBUMIN: 3.6 g/dL (ref 3.5–5.0)
ALK PHOS: 97 U/L (ref 38–126)
ALT: 69 U/L — AB (ref 14–54)
AST: 72 U/L — AB (ref 15–41)
Anion gap: 11 (ref 5–15)
BILIRUBIN TOTAL: 2 mg/dL — AB (ref 0.3–1.2)
BUN: 6 mg/dL (ref 6–20)
CALCIUM: 8.4 mg/dL — AB (ref 8.9–10.3)
CO2: 23 mmol/L (ref 22–32)
CREATININE: 0.63 mg/dL (ref 0.44–1.00)
Chloride: 102 mmol/L (ref 101–111)
GFR calc Af Amer: >60 mL/min (ref >60)
GLUCOSE: 147 mg/dL — AB (ref 65–99)
POTASSIUM: 3 mmol/L — AB (ref 3.5–5.1)
Sodium: 136 mmol/L (ref 135–145)
TOTAL PROTEIN: 7.1 g/dL (ref 6.5–8.1)

## 2017-06-10 LAB — URINALYSIS, ROUTINE W REFLEX MICROSCOPIC
Bilirubin Urine: NEGATIVE
GLUCOSE, UA: NEGATIVE mg/dL
HGB URINE DIPSTICK: NEGATIVE
Ketones, ur: 5 mg/dL — AB
NITRITE: POSITIVE — AB
Protein, ur: 100 mg/dL — AB
SPECIFIC GRAVITY, URINE: 1.013 (ref 1.005–1.030)
pH: 5 (ref 5.0–8.0)

## 2017-06-10 LAB — I-STAT BETA HCG BLOOD, ED (MC, WL, AP ONLY): HCG, QUANTITATIVE: 7.1 m[IU]/mL — AB (ref <5)

## 2017-06-10 LAB — I-STAT CG4 LACTIC ACID, ED
LACTIC ACID, VENOUS: 0.93 mmol/L (ref 0.5–1.9)
Lactic Acid, Venous: 1.28 mmol/L (ref 0.5–1.9)

## 2017-06-10 LAB — PREGNANCY, URINE: Preg Test, Ur: NEGATIVE

## 2017-06-10 MED ORDER — SODIUM CHLORIDE 0.9 % IV BOLUS (SEPSIS)
1000.0000 mL | Freq: Once | INTRAVENOUS | Status: AC
  Administered 2017-06-10: 1000 mL via INTRAVENOUS

## 2017-06-10 MED ORDER — POTASSIUM CHLORIDE IN NACL 40-0.9 MEQ/L-% IV SOLN
INTRAVENOUS | Status: DC
  Administered 2017-06-10 – 2017-06-12 (×5): 125 mL/h via INTRAVENOUS
  Filled 2017-06-10 (×6): qty 1000

## 2017-06-10 MED ORDER — IOPAMIDOL (ISOVUE-300) INJECTION 61%
INTRAVENOUS | Status: AC
  Administered 2017-06-10: 100 mL via INTRAVENOUS
  Filled 2017-06-10: qty 100

## 2017-06-10 MED ORDER — DIPHENHYDRAMINE HCL 50 MG/ML IJ SOLN: 25.0000 mg | Freq: Four times a day (QID) | INTRAMUSCULAR | Status: DC | PRN

## 2017-06-10 MED ORDER — VANCOMYCIN HCL 10 G IV SOLR
1500.0000 mg | Freq: Once | INTRAVENOUS | Status: AC
  Administered 2017-06-10: 1500 mg via INTRAVENOUS
  Filled 2017-06-10: qty 1500

## 2017-06-10 MED ORDER — MORPHINE SULFATE (PF) 4 MG/ML IV SOLN
4.0000 mg | Freq: Once | INTRAVENOUS | Status: AC
  Administered 2017-06-10: 4 mg via INTRAVENOUS
  Filled 2017-06-10: qty 1

## 2017-06-10 MED ORDER — VANCOMYCIN HCL IN DEXTROSE 1-5 GM/200ML-% IV SOLN: 1000.0000 mg | Freq: Three times a day (TID) | INTRAVENOUS | Status: DC

## 2017-06-10 MED ORDER — ONDANSETRON HCL 4 MG/2ML IJ SOLN
4.0000 mg | Freq: Four times a day (QID) | INTRAMUSCULAR | Status: DC | PRN
  Administered 2017-06-14 – 2017-06-16 (×2): 4 mg via INTRAVENOUS
  Filled 2017-06-10 (×2): qty 2

## 2017-06-10 MED ORDER — OXYCODONE HCL 5 MG PO TABS: 5.0000 mg | ORAL_TABLET | ORAL | Status: DC | PRN

## 2017-06-10 MED ORDER — ACETAMINOPHEN 500 MG PO TABS
1000.0000 mg | ORAL_TABLET | Freq: Once | ORAL | Status: AC
  Administered 2017-06-10: 1000 mg via ORAL
  Filled 2017-06-10: qty 2

## 2017-06-10 MED ORDER — DIPHENHYDRAMINE HCL 25 MG PO CAPS: 25.0000 mg | ORAL_CAPSULE | Freq: Four times a day (QID) | ORAL | Status: DC | PRN

## 2017-06-10 MED ORDER — VANCOMYCIN HCL IN DEXTROSE 1-5 GM/200ML-% IV SOLN
1000.0000 mg | Freq: Three times a day (TID) | INTRAVENOUS | Status: DC
  Administered 2017-06-11 – 2017-06-12 (×3): 1000 mg via INTRAVENOUS
  Filled 2017-06-10 (×4): qty 200

## 2017-06-10 MED ORDER — DEXTROSE 5 % IV SOLN
1.0000 g | Freq: Once | INTRAVENOUS | Status: AC
  Administered 2017-06-10: 1 g via INTRAVENOUS
  Filled 2017-06-10: qty 10

## 2017-06-10 MED ORDER — HYDRALAZINE HCL 20 MG/ML IJ SOLN: 10.0000 mg | INTRAMUSCULAR | Status: DC | PRN

## 2017-06-10 MED ORDER — IBUPROFEN 200 MG PO TABS
600.0000 mg | ORAL_TABLET | Freq: Four times a day (QID) | ORAL | Status: DC | PRN
  Administered 2017-06-10 (×2): 600 mg via ORAL
  Filled 2017-06-10: qty 1
  Filled 2017-06-10: qty 3

## 2017-06-10 MED ORDER — PIPERACILLIN-TAZOBACTAM 3.375 G IVPB
3.3750 g | Freq: Three times a day (TID) | INTRAVENOUS | Status: DC
  Administered 2017-06-10 – 2017-06-11 (×2): 3.375 g via INTRAVENOUS
  Administered 2017-06-11: 3.375 mg via INTRAVENOUS
  Administered 2017-06-12 – 2017-06-13 (×5): 3.375 g via INTRAVENOUS
  Filled 2017-06-10 (×7): qty 50

## 2017-06-10 MED ORDER — ONDANSETRON 4 MG PO TBDP
4.0000 mg | ORAL_TABLET | Freq: Four times a day (QID) | ORAL | Status: DC | PRN
  Administered 2017-06-13: 4 mg via ORAL
  Filled 2017-06-10 (×2): qty 1

## 2017-06-10 NOTE — H&P (Signed)
[facility] Surgery Consult/Admission Note  Frances [name] May 25, 1996  [id number].    Requesting MD: Dr. Laverta Baltimore Chief Complaint/Reason for Consult: possible abscess of left flank  HPI:   Pt is a 21 year old female with a history of seizures as a child, asthma, anemia on iron supplements who presented to the [facility] ED with complaints of mass and pain to left flank. Pt states about a week ago she noticed a "bug bite" on her left flank. It became progressively more painful and larger. She states constant pain, worse with touch, nonradiaiting, with associated hot spells and decreased appetitie. She states she has not ate or drank much in the last few days.  She tried hot bathes with epsom salt and alcohol without relief. She states after one bath she noticed bloody discharge. She denies injury to this area. No fevers or chills at home. No other associated symptoms. No abdominal pain, nausea, vomiting, CP or SOB.   ROS:  Review of Systems  Constitutional: Negative for chills and fever.  HENT: Negative for sore throat.   Respiratory: Negative for shortness of breath.   Cardiovascular: Negative for chest pain.  Gastrointestinal: Negative for abdominal pain, nausea and vomiting.  Genitourinary: Negative for dysuria and hematuria.  Musculoskeletal: Negative for joint pain.  Skin: Positive for rash (large mass to left flank).  Neurological: Negative for dizziness and loss of consciousness.  All other systems reviewed and are negative.    Family History  Problem Relation Age of Onset  . Asthma Mother   . Hypertension Mother   . Asthma Sister   . Birth defects Sister     Past Medical History:  Diagnosis Date  . Anemia    on Iron supplements  . Asthma 1999   as a child; no current problems  . Chronic kidney disease    as a child  . Constipation   . Gall stones   . Seizures ([place])    seizure disorder as a child, treated with medication; none since  . UTI (lower urinary tract  infection)     Past Surgical History:  Procedure Laterality Date  . TYMPANOSTOMY TUBE PLACEMENT      Social History:  reports that she has never smoked. She has never used smokeless tobacco. She reports that she does not drink alcohol or use drugs.  Allergies: No Known Allergies   (Not in a hospital admission)  Blood pressure 98/68, pulse 99, temperature (!) 101.9 F (38.8 C), temperature source Oral, resp. rate 12, last menstrual period 05/15/2017, SpO2 100 %, not currently breastfeeding.  Physical Exam  Constitutional: She is oriented to person, place, and time and well-developed, well-nourished, and in no distress. Vital signs are normal. No distress.  HENT:  Head: Normocephalic and atraumatic.  Nose: Nose normal.  Mouth/Throat: Oropharynx is clear and moist.  Eyes: Pupils are equal, round, and reactive to light. Conjunctivae are normal. Right eye exhibits no discharge. Left eye exhibits no discharge. No scleral icterus.  Neck: Normal range of motion. Neck supple. No tracheal deviation present. No thyromegaly present.  Cardiovascular: Regular rhythm, S1 normal, S2 normal, normal heart sounds and intact distal pulses.  Tachycardia present.   Pulses:      Radial pulses are 2+ on the right side, and 2+ on the left side.       Dorsalis pedis pulses are 2+ on the right side, and 2+ on the left side.  Pulmonary/Chest: Effort normal and breath sounds normal. No respiratory distress. She has  no decreased breath sounds. She has no wheezes. She has no rhonchi. She has no rales.  Abdominal: Soft. Bowel sounds are normal. She exhibits no distension and no mass. There is no tenderness. There is no rebound and no guarding.  Musculoskeletal: Normal range of motion. She exhibits no edema or deformity.  Lymphadenopathy:    She has no cervical adenopathy.  Neurological: She is alert and oriented to person, place, and time.  Skin: Skin is warm and dry. She is not diaphoretic.  Large mass noted  to left flank with erythema and induration. No fluctuance noted. No drainage noted. Overlying skin is sloughing off. Area TTP.  Psychiatric: Mood and affect normal.  Nursing note and vitals reviewed.   Results for orders placed or performed during the hospital encounter of 06/10/17 (from the past 48 hour(s))  Comprehensive metabolic panel     Status: Abnormal   Collection Time: 06/10/17  6:54 AM  Result Value Ref Range   Sodium 136 135 - 145 mmol/L   Potassium 3.0 (L) 3.5 - 5.1 mmol/L   Chloride 102 101 - 111 mmol/L   CO2 23 22 - 32 mmol/L   Glucose, Bld 147 (H) 65 - 99 mg/dL   BUN 6 6 - 20 mg/dL   Creatinine, Ser 0.63 0.44 - 1.00 mg/dL   Calcium 8.4 (L) 8.9 - 10.3 mg/dL   Total Protein 7.1 6.5 - 8.1 g/dL   Albumin 3.6 3.5 - 5.0 g/dL   AST 72 (H) 15 - 41 U/L   ALT 69 (H) 14 - 54 U/L   Alkaline Phosphatase 97 38 - 126 U/L   Total Bilirubin 2.0 (H) 0.3 - 1.2 mg/dL   GFR calc non Af Amer >60 >60 mL/min   GFR calc Af Amer >60 >60 mL/min    Comment: (NOTE) The eGFR has been calculated using the CKD EPI equation. This calculation has not been validated in all clinical situations. eGFR's persistently <60 mL/min signify possible Chronic Kidney Disease.    Anion gap 11 5 - 15  CBC with Differential     Status: Abnormal   Collection Time: 06/10/17  6:54 AM  Result Value Ref Range   WBC 11.8 (H) 4.0 - 10.5 K/uL   RBC 3.56 (L) 3.87 - 5.11 MIL/uL   Hemoglobin 10.8 (L) 12.0 - 15.0 g/dL   HCT 31.3 (L) 36.0 - 46.0 %   MCV 87.9 78.0 - 100.0 fL   MCH 30.3 26.0 - 34.0 pg   MCHC 34.5 30.0 - 36.0 g/dL   RDW 12.7 11.5 - 15.5 %   Platelets 243 150 - 400 K/uL   Neutrophils Relative % 74 %   Neutro Abs 8.7 (H) 1.7 - 7.7 K/uL   Lymphocytes Relative 12 %   Lymphs Abs 1.4 0.7 - 4.0 K/uL   Monocytes Relative 14 %   Monocytes Absolute 1.6 (H) 0.1 - 1.0 K/uL   Eosinophils Relative 0 %   Eosinophils Absolute 0.0 0.0 - 0.7 K/uL   Basophils Relative 0 %   Basophils Absolute 0.0 0.0 - 0.1 K/uL   I-Stat Beta hCG blood, ED ([facility], [facility], [facility] only)     Status: Abnormal   Collection Time: 06/10/17  7:09 AM  Result Value Ref Range   I-stat hCG, quantitative 7.1 (H) <5 mIU/mL   Comment 3            Comment:   GEST. AGE      CONC.  (mIU/mL)   <=1 WEEK  5 - 50     2 WEEKS       50 - 500     3 WEEKS       100 - 10,000     4 WEEKS     1,000 - 30,000        FEMALE AND NON-PREGNANT FEMALE:     LESS THAN 5 mIU/mL   I-Stat CG4 Lactic Acid, ED     Status: None   Collection Time: 06/10/17  7:12 AM  Result Value Ref Range   Lactic Acid, Venous 1.28 0.5 - 1.9 mmol/L  Urinalysis, Routine w reflex microscopic     Status: Abnormal   Collection Time: 06/10/17  8:20 AM  Result Value Ref Range   Color, Urine AMBER (A) YELLOW    Comment: BIOCHEMICALS MAY BE AFFECTED BY COLOR   APPearance HAZY (A) CLEAR   Specific Gravity, Urine 1.013 1.005 - 1.030   pH 5.0 5.0 - 8.0   Glucose, UA NEGATIVE NEGATIVE mg/dL   Hgb urine dipstick NEGATIVE NEGATIVE   Bilirubin Urine NEGATIVE NEGATIVE   Ketones, ur 5 (A) NEGATIVE mg/dL   Protein, ur 100 (A) NEGATIVE mg/dL   Nitrite POSITIVE (A) NEGATIVE   Leukocytes, UA LARGE (A) NEGATIVE   RBC / HPF 0-5 0 - 5 RBC/hpf   WBC, UA TOO NUMEROUS TO COUNT 0 - 5 WBC/hpf   Bacteria, UA MANY (A) NONE SEEN   Squamous Epithelial / LPF 0-5 (A) NONE SEEN   Mucus PRESENT    Hyaline Casts, UA PRESENT   Pregnancy, urine     Status: None   Collection Time: 06/10/17  8:20 AM  Result Value Ref Range   Preg Test, Ur NEGATIVE NEGATIVE    Comment:        THE SENSITIVITY OF THIS METHODOLOGY IS >20 mIU/mL.   I-Stat CG4 Lactic Acid, ED     Status: None   Collection Time: 06/10/17  9:28 AM  Result Value Ref Range   Lactic Acid, Venous 0.93 0.5 - 1.9 mmol/L   Ct Abdomen Pelvis W Contrast  Result Date: 06/10/2017 CLINICAL DATA:  Left flank mass. EXAM: CT ABDOMEN AND PELVIS WITH CONTRAST TECHNIQUE: Multidetector CT imaging of the abdomen and pelvis was performed using the standard  protocol following bolus administration of intravenous contrast. CONTRAST:  116m ISOVUE-300 IOPAMIDOL (ISOVUE-300) INJECTION 61% COMPARISON:  None. FINDINGS: Musculoskeletal: There is an ill-defined 15 x 13 x 7 cm area of soft tissue stranding in the subcutaneous fat of the left flank containing smaller areas of more solid appearing density. The appearance is suggestive of hemorrhage in the soft tissues. Is there any bruising? Has the patient had any injections in that area? There is no rim enhancement to suggest abscess. Incidental note is made of a central soft disc protrusion at L4-5. Lower chest: Normal. Hepatobiliary: No focal liver abnormality is seen. No gallstones, gallbladder wall thickening, or biliary dilatation. Pancreas: Unremarkable. No pancreatic ductal dilatation or surrounding inflammatory changes. Spleen: Normal in size without focal abnormality. Adrenals/Urinary Tract: Adrenal glands are unremarkable. Kidneys are normal, without renal calculi, focal lesion, or hydronephrosis. Bladder is unremarkable. Stomach/Bowel: Stomach is within normal limits. Appendix appears normal. No evidence of bowel wall thickening, distention, or inflammatory changes. Vascular/Lymphatic: No significant vascular findings are present. No enlarged abdominal or pelvic lymph nodes. Reproductive: Uterus and bilateral adnexa are unremarkable. Other: None IMPRESSION: 1. Large area of soft tissue stranding and ill-defined soft tissue density in the subcutaneous fat of the left flank  centered at the L3-4 level. I think this most likely represents an area of hemorrhage and soft tissue contusion. Has the patient had any trauma to that area? 2. Less likely this could represent an area of focal cellulitis with small soft tissue abscesses although there is no rim enhancement of the areas of increased density. 3. Has the patient had any injections in that area? Electronically Signed   By: Lorriane Shire M.D.   On: 06/10/2017 09:32       Assessment/Plan  Abscess/cellulitis left flank - admit with IV abx, NPO - will discuss option of drainage vs surgery with IR  Hypokalemia - replenish in IVF  Iron deficiency anemia - monitor - when pt able to eat will resume iron suppliments  Elevated Tbili - unknown etiology although could be from little PO intake  FEN: NPO, IVF with K VTE: SCD's, lovenox after IR ID: febrile & tachy, WBC 11.8 Vanc & Zosyn per pharmacy  Plan: admit, IR consults  Kalman Drape, [facility] Surgery 06/10/2017, 11:04 AM Pager: [phone] Consults: [phone] Mon-Fri 7:00 am-4:30 pm Sat-Sun 7:00 am-11:30 am

## 2017-06-10 NOTE — ED Notes (Signed)
Patient transported to CT 

## 2017-06-10 NOTE — ED Triage Notes (Signed)
Pt complains of left sided back pain and swelling Pt also has a fever

## 2017-06-10 NOTE — ED Notes (Signed)
ED TO INPATIENT HANDOFF REPORT  Name/Age/Gender Frances [name] 21 y.o. female  Code Status Code Status History    Date Active Date Inactive Code Status Order ID Comments User Context   02/28/2017  6:39 PM 03/01/2017  3:12 PM Full Code [id number]  Isaias Sakai, DO Inpatient   02/28/2017  7:24 AM 02/28/2017  6:39 PM Full Code [id number]  Luvenia Redden, PA-C Inpatient      Home/SNF/Other Home  Chief Complaint back pain  Level of Care/Admitting Diagnosis ED Disposition    ED Disposition Condition [place] [facility] Area: [facility] [[id number]]  Level of Care: Med-Surg [16]  Diagnosis: Cellulitis and abscess of trunk [782.2.ICD-9-CM]  Admitting Physician: Jackolyn Confer [[id number]]  Attending Physician: CCS, MD [[id number]]  Bed request comments: 5W  PT Class (Do Not Modify): Observation [104]  PT Acc Code (Do Not Modify): Observation [10022]       Medical History Past Medical History:  Diagnosis Date  . Anemia    on Iron supplements  . Asthma 1999   as a child; no current problems  . Chronic kidney disease    as a child  . Constipation   . Gall stones   . Seizures ([place])    seizure disorder as a child, treated with medication; none since  . UTI (lower urinary tract infection)     Allergies No Known Allergies  IV Location/Drains/Wounds Patient Lines/Drains/Airways Status   Active Line/Drains/Airways    Name:   Placement date:   Placement time:   Site:   Days:   Peripheral IV 06/10/17 Right;Anterior Forearm  06/10/17    0700    Forearm    less than 1          Labs/Imaging Results for orders placed or performed during the hospital encounter of 06/10/17 (from the past 48 hour(s))  Comprehensive metabolic panel     Status: Abnormal   Collection Time: 06/10/17  6:54 AM  Result Value Ref Range   Sodium 136 135 - 145 mmol/L   Potassium 3.0 (L) 3.5 - 5.1 mmol/L   Chloride 102 101 - 111 mmol/L   CO2 23 22 - 32 mmol/L   Glucose, Bld  147 (H) 65 - 99 mg/dL   BUN 6 6 - 20 mg/dL   Creatinine, Ser 0.63 0.44 - 1.00 mg/dL   Calcium 8.4 (L) 8.9 - 10.3 mg/dL   Total Protein 7.1 6.5 - 8.1 g/dL   Albumin 3.6 3.5 - 5.0 g/dL   AST 72 (H) 15 - 41 U/L   ALT 69 (H) 14 - 54 U/L   Alkaline Phosphatase 97 38 - 126 U/L   Total Bilirubin 2.0 (H) 0.3 - 1.2 mg/dL   GFR calc non Af Amer >60 >60 mL/min   GFR calc Af Amer >60 >60 mL/min    Comment: (NOTE) The eGFR has been calculated using the CKD EPI equation. This calculation has not been validated in all clinical situations. eGFR's persistently <60 mL/min signify possible Chronic Kidney Disease.    Anion gap 11 5 - 15  CBC with Differential     Status: Abnormal   Collection Time: 06/10/17  6:54 AM  Result Value Ref Range   WBC 11.8 (H) 4.0 - 10.5 K/uL   RBC 3.56 (L) 3.87 - 5.11 MIL/uL   Hemoglobin 10.8 (L) 12.0 - 15.0 g/dL   HCT 31.3 (L) 36.0 - 46.0 %   MCV 87.9 78.0 - 100.0 fL   MCH  30.3 26.0 - 34.0 pg   MCHC 34.5 30.0 - 36.0 g/dL   RDW 12.7 11.5 - 15.5 %   Platelets 243 150 - 400 K/uL   Neutrophils Relative % 74 %   Neutro Abs 8.7 (H) 1.7 - 7.7 K/uL   Lymphocytes Relative 12 %   Lymphs Abs 1.4 0.7 - 4.0 K/uL   Monocytes Relative 14 %   Monocytes Absolute 1.6 (H) 0.1 - 1.0 K/uL   Eosinophils Relative 0 %   Eosinophils Absolute 0.0 0.0 - 0.7 K/uL   Basophils Relative 0 %   Basophils Absolute 0.0 0.0 - 0.1 K/uL  I-Stat Beta hCG blood, ED ([facility], [facility], [facility] only)     Status: Abnormal   Collection Time: 06/10/17  7:09 AM  Result Value Ref Range   I-stat hCG, quantitative 7.1 (H) <5 mIU/mL   Comment 3            Comment:   GEST. AGE      CONC.  (mIU/mL)   <=1 WEEK        5 - 50     2 WEEKS       50 - 500     3 WEEKS       100 - 10,000     4 WEEKS     1,000 - 30,000        FEMALE AND NON-PREGNANT FEMALE:     LESS THAN 5 mIU/mL   I-Stat CG4 Lactic Acid, ED     Status: None   Collection Time: 06/10/17  7:12 AM  Result Value Ref Range   Lactic Acid, Venous 1.28 0.5 - 1.9  mmol/L  Urinalysis, Routine w reflex microscopic     Status: Abnormal   Collection Time: 06/10/17  8:20 AM  Result Value Ref Range   Color, Urine AMBER (A) YELLOW    Comment: BIOCHEMICALS MAY BE AFFECTED BY COLOR   APPearance HAZY (A) CLEAR   Specific Gravity, Urine 1.013 1.005 - 1.030   pH 5.0 5.0 - 8.0   Glucose, UA NEGATIVE NEGATIVE mg/dL   Hgb urine dipstick NEGATIVE NEGATIVE   Bilirubin Urine NEGATIVE NEGATIVE   Ketones, ur 5 (A) NEGATIVE mg/dL   Protein, ur 100 (A) NEGATIVE mg/dL   Nitrite POSITIVE (A) NEGATIVE   Leukocytes, UA LARGE (A) NEGATIVE   RBC / HPF 0-5 0 - 5 RBC/hpf   WBC, UA TOO NUMEROUS TO COUNT 0 - 5 WBC/hpf   Bacteria, UA MANY (A) NONE SEEN   Squamous Epithelial / LPF 0-5 (A) NONE SEEN   Mucus PRESENT    Hyaline Casts, UA PRESENT   Pregnancy, urine     Status: None   Collection Time: 06/10/17  8:20 AM  Result Value Ref Range   Preg Test, Ur NEGATIVE NEGATIVE    Comment:        THE SENSITIVITY OF THIS METHODOLOGY IS >20 mIU/mL.   I-Stat CG4 Lactic Acid, ED     Status: None   Collection Time: 06/10/17  9:28 AM  Result Value Ref Range   Lactic Acid, Venous 0.93 0.5 - 1.9 mmol/L   Ct Abdomen Pelvis W Contrast  Result Date: 06/10/2017 CLINICAL DATA:  Left flank mass. EXAM: CT ABDOMEN AND PELVIS WITH CONTRAST TECHNIQUE: Multidetector CT imaging of the abdomen and pelvis was performed using the standard protocol following bolus administration of intravenous contrast. CONTRAST:  100mL ISOVUE-300 IOPAMIDOL (ISOVUE-300) INJECTION 61% COMPARISON:  None. FINDINGS: Musculoskeletal: There is an ill-defined 15 x 13   x 7 cm area of soft tissue stranding in the subcutaneous fat of the left flank containing smaller areas of more solid appearing density. The appearance is suggestive of hemorrhage in the soft tissues. Is there any bruising? Has the patient had any injections in that area? There is no rim enhancement to suggest abscess. Incidental note is made of a central soft  disc protrusion at L4-5. Lower chest: Normal. Hepatobiliary: No focal liver abnormality is seen. No gallstones, gallbladder wall thickening, or biliary dilatation. Pancreas: Unremarkable. No pancreatic ductal dilatation or surrounding inflammatory changes. Spleen: Normal in size without focal abnormality. Adrenals/Urinary Tract: Adrenal glands are unremarkable. Kidneys are normal, without renal calculi, focal lesion, or hydronephrosis. Bladder is unremarkable. Stomach/Bowel: Stomach is within normal limits. Appendix appears normal. No evidence of bowel wall thickening, distention, or inflammatory changes. Vascular/Lymphatic: No significant vascular findings are present. No enlarged abdominal or pelvic lymph nodes. Reproductive: Uterus and bilateral adnexa are unremarkable. Other: None IMPRESSION: 1. Large area of soft tissue stranding and ill-defined soft tissue density in the subcutaneous fat of the left flank centered at the L3-4 level. I think this most likely represents an area of hemorrhage and soft tissue contusion. Has the patient had any trauma to that area? 2. Less likely this could represent an area of focal cellulitis with small soft tissue abscesses although there is no rim enhancement of the areas of increased density. 3. Has the patient had any injections in that area? Electronically Signed   By: Lorriane Shire M.D.   On: 06/10/2017 09:32    Pending Labs [employer]    Start     Ordered   06/10/17 0715  Urine culture  STAT,   STAT    Question:  Patient immune status  Answer:  Normal   06/10/17 0715   Signed and Held  Comprehensive metabolic panel  Tomorrow morning,   R     Signed and Held   Signed and Held  CBC  Tomorrow morning,   R     Signed and Held   Signed and Held  HIV antibody  Once,   R     Signed and Held      Vitals/Pain Today's Vitals   06/10/17 1208 06/10/17 1230 06/10/17 1331 06/10/17 1400  BP:  107/70 90/69 113/71  Pulse:  100 (!) 130 (!) 127  Resp:  19 (!) 22  (!) 21  Temp:   (!) 101.5 F (38.6 C)   TempSrc:   Oral   SpO2:  100% 100% 100%  Weight:      Height:      PainSc: 3        Isolation Precautions No active isolations  Medications Medications  ibuprofen (ADVIL,MOTRIN) tablet 600 mg (600 mg Oral Given 06/10/17 1336)  acetaminophen (TYLENOL) tablet 1,000 mg (1,000 mg Oral Given 06/10/17 0711)  sodium chloride 0.9 % bolus 1,000 mL (0 mLs Intravenous Stopped 06/10/17 0917)  morphine 4 MG/ML injection 4 mg (4 mg Intravenous Given 06/10/17 0741)  iopamidol (ISOVUE-300) 61 % injection (100 mLs Intravenous Contrast Given 06/10/17 0856)  cefTRIAXone (ROCEPHIN) 1 g in dextrose 5 % 50 mL IVPB (0 g Intravenous Stopped 06/10/17 1104)    Mobility walks

## 2017-06-10 NOTE — Progress Notes (Signed)
Pharmacy Antibiotic Note  Frances [name] is a [name] y.o. female presented to the ED on 06/10/2017 with c/o left sided back pain, swelling and fever.  She reported of having a bug bite at the site and it has progressively gotten larger and more painful.  To start vancomycin and zosyn for left flank abscess/cellulitis.  Today, 06/10/2017: - Tmax 101.5, wbc 11.8 -  scr 0.63 (crcl~100)   Plan: - zosyn 3.375 gm IV q8h (infuse over 4 hrs) - vancomycin 1500 mg IV x1, then  1000 mg IV q8h -- est AUC 487 (goal AUC 400-500) -daily scr - monitor renal function closely  __________________________________   Height: 5\' 7"  (170.2 cm) Weight: 174 lb (78.9 kg) IBW/kg (Calculated) : 61.6  Temp (24hrs), Avg:100.3 F (37.9 C), Min:98.2 F (36.8 C), Max:101.9 F (38.8 C)   Recent Labs Lab 06/10/17 0654 06/10/17 0712 06/10/17 0928  WBC 11.8*  --   --   CREATININE 0.63  --   --   LATICACIDVEN  --  1.28 0.93    Estimated Creatinine Clearance: 122.3 mL/min (by C-G formula based on SCr of 0.63 mg/dL).    No Known Allergies   Thank you for allowing pharmacy to be a part of this patient's care.  Lucia Gaskinsham, Minie Roadcap P 06/10/2017 3:26 PM

## 2017-06-10 NOTE — ED Provider Notes (Signed)
Emergency Department Provider Note   I have reviewed the triage vital signs and the nursing notes.   HISTORY  Chief Complaint Back Pain and Fever   HPI Frances [name] is a 21 y.o. female with PMH of seizures, asthma, and anemia presents to the ED for evaluation of worsening left flank pain with enlarging mass in the same area. She denies any known inciting event. No injury. She notes some blood coming from the mass earlier in the week but no drainage since. She was found to be febrile here but did not know about this until today. No similar masses or I&Ds in the past. No URI symptoms, cough, vomiting, or diarrhea. The mass is painful with pressure in the area and worse with movement.   Past Medical History:  Diagnosis Date  . Anemia    on Iron supplements  . Asthma 1999   as a child; no current problems  . Chronic kidney disease    as a child  . Constipation   . Gall stones   . Seizures (HCC)    seizure disorder as a child, treated with medication; none since  . UTI (lower urinary tract infection)     Patient Active Problem List   Diagnosis Date Noted  . Cellulitis and abscess of trunk 06/10/2017  . Gallstones 10/23/2016  . Healthcare maintenance 09/16/2016  . Recurrent UTI 09/16/2016    Past Surgical History:  Procedure Laterality Date  . TYMPANOSTOMY TUBE PLACEMENT        Allergies Patient has no known allergies.  Family History  Problem Relation Age of Onset  . Asthma Mother   . Hypertension Mother   . Asthma Sister   . Birth defects Sister     Social History Social History  Substance Use Topics  . Smoking status: Never Smoker  . Smokeless tobacco: Never Used  . Alcohol use No     Comment: occ    Review of Systems  Constitutional: No fever/chills Eyes: No visual changes. ENT: No sore throat. Cardiovascular: Denies chest pain. Respiratory: Denies shortness of breath. Gastrointestinal: Positive abdominal/left flank pain.  No nausea, no  vomiting.  No diarrhea.  No constipation. Genitourinary: Negative for dysuria. Musculoskeletal: Negative for back pain. Skin: Negative for rash. Large back mass.  Neurological: Negative for headaches, focal weakness or numbness.  10-point ROS otherwise negative.  ____________________________________________   PHYSICAL EXAM:  VITAL SIGNS: ED Triage Vitals  Enc Vitals Group     BP 06/10/17 0639 (!) 106/56     Pulse Rate 06/10/17 0639 (!) 124     Resp 06/10/17 0639 18     Temp 06/10/17 0639 (!) 101.9 F (38.8 C)     Temp Source 06/10/17 0639 Oral     SpO2 06/10/17 0639 98 %     Pain Score 06/10/17 0640 10   Constitutional: Alert and oriented. Appears uncomfortable.  Eyes: Conjunctivae are normal. Head: Atraumatic. Nose: No congestion/rhinnorhea. Mouth/Throat: Mucous membranes are moist. Neck: No stridor.  Cardiovascular: Normal rate, regular rhythm. Good peripheral circulation. Grossly normal heart sounds.   Respiratory: Normal respiratory effort.  No retractions. Lungs CTAB. Gastrointestinal: Soft and nontender. No distention.  Musculoskeletal: No lower extremity tenderness nor edema. No gross deformities of extremities. Neurologic:  Normal speech and language. No gross focal neurologic deficits are appreciated.  Skin:  Skin is warm, dry and intact. 18 cm x 10 cm mass over the left flank/lower back that is slightly erythematous. Non-fluctuant.   ____________________________________________   Vickie Epley (  all labs ordered are listed, but only abnormal results are displayed)  Labs Reviewed  COMPREHENSIVE METABOLIC PANEL - Abnormal; Notable for the following:       Result Value   Potassium 3.0 (*)    Glucose, Bld 147 (*)    Calcium 8.4 (*)    AST 72 (*)    ALT 69 (*)    Total Bilirubin 2.0 (*)    All other components within normal limits  CBC WITH DIFFERENTIAL/PLATELET - Abnormal; Notable for the following:    WBC 11.8 (*)    RBC 3.56 (*)    Hemoglobin 10.8 (*)    HCT  31.3 (*)    Neutro Abs 8.7 (*)    Monocytes Absolute 1.6 (*)    All other components within normal limits  URINALYSIS, ROUTINE W REFLEX MICROSCOPIC - Abnormal; Notable for the following:    Color, Urine AMBER (*)    APPearance HAZY (*)    Ketones, ur 5 (*)    Protein, ur 100 (*)    Nitrite POSITIVE (*)    Leukocytes, UA LARGE (*)    Bacteria, UA MANY (*)    Squamous Epithelial / LPF 0-5 (*)    All other components within normal limits  I-STAT BETA HCG BLOOD, ED ([facility], [facility], [facility] ONLY) - Abnormal; Notable for the following:    I-stat hCG, quantitative 7.1 (*)    All other components within normal limits  URINE CULTURE  PREGNANCY, URINE  HIV ANTIBODY (ROUTINE TESTING)  COMPREHENSIVE METABOLIC PANEL  CBC  I-STAT CG4 LACTIC ACID, ED  I-STAT CG4 LACTIC ACID, ED   ____________________________________________  RADIOLOGY  Ct Abdomen Pelvis W Contrast  Result Date: 06/10/2017 CLINICAL DATA:  Left flank mass. EXAM: CT ABDOMEN AND PELVIS WITH CONTRAST TECHNIQUE: Multidetector CT imaging of the abdomen and pelvis was performed using the standard protocol following bolus administration of intravenous contrast. CONTRAST:  100mL ISOVUE-300 IOPAMIDOL (ISOVUE-300) INJECTION 61% COMPARISON:  None. FINDINGS: Musculoskeletal: There is an ill-defined 15 x 13 x 7 cm area of soft tissue stranding in the subcutaneous fat of the left flank containing smaller areas of more solid appearing density. The appearance is suggestive of hemorrhage in the soft tissues. Is there any bruising? Has the patient had any injections in that area? There is no rim enhancement to suggest abscess. Incidental note is made of a central soft disc protrusion at L4-5. Lower chest: Normal. Hepatobiliary: No focal liver abnormality is seen. No gallstones, gallbladder wall thickening, or biliary dilatation. Pancreas: Unremarkable. No pancreatic ductal dilatation or surrounding inflammatory changes. Spleen: Normal in size without focal  abnormality. Adrenals/Urinary Tract: Adrenal glands are unremarkable. Kidneys are normal, without renal calculi, focal lesion, or hydronephrosis. Bladder is unremarkable. Stomach/Bowel: Stomach is within normal limits. Appendix appears normal. No evidence of bowel wall thickening, distention, or inflammatory changes. Vascular/Lymphatic: No significant vascular findings are present. No enlarged abdominal or pelvic lymph nodes. Reproductive: Uterus and bilateral adnexa are unremarkable. Other: None IMPRESSION: 1. Large area of soft tissue stranding and ill-defined soft tissue density in the subcutaneous fat of the left flank centered at the L3-4 level. I think this most likely represents an area of hemorrhage and soft tissue contusion. Has the patient had any trauma to that area? 2. Less likely this could represent an area of focal cellulitis with small soft tissue abscesses although there is no rim enhancement of the areas of increased density. 3. Has the patient had any injections in that area? Electronically Signed   By:  Francene Boyers M.D.   On: 06/10/2017 09:32    ____________________________________________   PROCEDURES  Procedure(s) performed:   Procedures  EMERGENCY DEPARTMENT US SOFT TISSUE INTERPRETATION "Study: Limited Soft Tissue Ultrasound"  INDICATIONS: Pain and Soft tissue infection Multiple views of the body part were obtained in real-time with a multi-frequency linear probe PERFORMED BY:  Myself SIDE:Left BODY PART:Lower back and Abdominal wall FINDINGS: No abcess noted INTERPRETATION:  No abcess noted   CPT:  Lower back 76705-26  ____________________________________________   INITIAL IMPRESSION / ASSESSMENT AND PLAN / ED COURSE  Pertinent labs & imaging results that were available during my care of the patient were reviewed by me and considered in my medical decision making (see chart for details).  Patient presents to the ED for evaluation of lower back/flank pain  with a large soft tissue mass in the area with slight erythema. No fluctuance. Bedside US shows no defined fluid collection.  CT abdomen pelvis.  Will give Tylenol and obtain infection labs.  Patient does have tachycardia.  Plan for IV fluids.  Patient's i-STAT beta-hCG came back as 7. Suspect false positive. Discussed with patient regarding the risk/benfit of CT with possible early pregnancy and she agrees to move ahead with CT.   CT read reviewed. Patient with UTI so will start treatment but suspect that mass in the source of fever. No history of injury to the back to suggest hematoma or injection to act as an infection source. Discussed with general surgery who plan for admission and IVF abx.   Discussed patient's case with General Surgery PA to request admission. Patient and family (if present) updated with plan. Care transferred to General Surgery service.  I reviewed all nursing notes, vitals, pertinent old records, EKGs, labs, imaging (as available).  ____________________________________________  FINAL CLINICAL IMPRESSION(S) / ED DIAGNOSES  Final diagnoses:  Cellulitis and abscess of trunk     MEDICATIONS GIVEN DURING THIS VISIT:  Medications  0.9 % NaCl with KCl 40 mEq / L  infusion (125 mL/hr Intravenous New Bag/Given 06/10/17 1624)  oxyCODONE (Oxy IR/ROXICODONE) immediate release tablet 5-10 mg (not administered)  ibuprofen (ADVIL,MOTRIN) tablet 600 mg (600 mg Oral Given 06/10/17 1336)  diphenhydrAMINE (BENADRYL) capsule 25 mg (not administered)    Or  diphenhydrAMINE (BENADRYL) injection 25 mg (not administered)  ondansetron (ZOFRAN-ODT) disintegrating tablet 4 mg (not administered)    Or  ondansetron (ZOFRAN) injection 4 mg (not administered)  hydrALAZINE (APRESOLINE) injection 10 mg (not administered)  piperacillin-tazobactam (ZOSYN) IVPB 3.375 g (3.375 g Intravenous New Bag/Given 06/10/17 1707)  vancomycin (VANCOCIN) IVPB 1000 mg/200 mL premix (not administered)    acetaminophen (TYLENOL) tablet 1,000 mg (1,000 mg Oral Given 06/10/17 0711)  sodium chloride 0.9 % bolus 1,000 mL (0 mLs Intravenous Stopped 06/10/17 0917)  morphine 4 MG/ML injection 4 mg (4 mg Intravenous Given 06/10/17 0741)  iopamidol (ISOVUE-300) 61 % injection (100 mLs Intravenous Contrast Given 06/10/17 0856)  cefTRIAXone (ROCEPHIN) 1 g in dextrose 5 % 50 mL IVPB (0 g Intravenous Stopped 06/10/17 1104)  vancomycin (VANCOCIN) 1,500 mg in sodium chloride 0.9 % 500 mL IVPB (0 mg Intravenous Stopped 06/10/17 1824)     NEW OUTPATIENT MEDICATIONS STARTED DURING THIS VISIT:  None   Note:  This document was prepared using Dragon voice recognition software and may include unintentional dictation errors.  Alona Bene, MD Emergency Medicine   Theador Jezewski, Arlyss Repress, MD 06/10/17 [phone]

## 2017-06-11 ENCOUNTER — Encounter (HOSPITAL_COMMUNITY): Admission: EM | Disposition: A | Discharge status: Home Health | Payer: Self-pay | Source: Home / Self Care

## 2017-06-11 ENCOUNTER — Observation Stay (HOSPITAL_COMMUNITY): Payer: Medicaid Other | Admitting: Anesthesiology

## 2017-06-11 ENCOUNTER — Encounter (HOSPITAL_COMMUNITY): Payer: Self-pay | Admitting: Certified Registered"

## 2017-06-11 DIAGNOSIS — N179 Acute kidney failure, unspecified: Secondary | ICD-10-CM | POA: Diagnosis not present

## 2017-06-11 DIAGNOSIS — L03319 Cellulitis of trunk, unspecified: Secondary | ICD-10-CM | POA: Diagnosis present

## 2017-06-11 DIAGNOSIS — T368X5A Adverse effect of other systemic antibiotics, initial encounter: Secondary | ICD-10-CM | POA: Diagnosis not present

## 2017-06-11 DIAGNOSIS — L03311 Cellulitis of abdominal wall: Secondary | ICD-10-CM | POA: Diagnosis present

## 2017-06-11 DIAGNOSIS — E876 Hypokalemia: Secondary | ICD-10-CM | POA: Diagnosis present

## 2017-06-11 DIAGNOSIS — L02211 Cutaneous abscess of abdominal wall: Secondary | ICD-10-CM | POA: Diagnosis present

## 2017-06-11 DIAGNOSIS — I96 Gangrene, not elsewhere classified: Secondary | ICD-10-CM | POA: Diagnosis present

## 2017-06-11 DIAGNOSIS — D509 Iron deficiency anemia, unspecified: Secondary | ICD-10-CM | POA: Diagnosis present

## 2017-06-11 DIAGNOSIS — B9562 Methicillin resistant Staphylococcus aureus infection as the cause of diseases classified elsewhere: Secondary | ICD-10-CM | POA: Diagnosis present

## 2017-06-11 DIAGNOSIS — Z825 Family history of asthma and other chronic lower respiratory diseases: Secondary | ICD-10-CM | POA: Diagnosis not present

## 2017-06-11 DIAGNOSIS — Z9889 Other specified postprocedural states: Secondary | ICD-10-CM

## 2017-06-11 DIAGNOSIS — Z8249 Family history of ischemic heart disease and other diseases of the circulatory system: Secondary | ICD-10-CM | POA: Diagnosis not present

## 2017-06-11 DIAGNOSIS — N390 Urinary tract infection, site not specified: Secondary | ICD-10-CM | POA: Diagnosis present

## 2017-06-11 DIAGNOSIS — B9620 Unspecified Escherichia coli [E. coli] as the cause of diseases classified elsewhere: Secondary | ICD-10-CM | POA: Diagnosis present

## 2017-06-11 DIAGNOSIS — I959 Hypotension, unspecified: Secondary | ICD-10-CM | POA: Diagnosis present

## 2017-06-11 HISTORY — PX: IRRIGATION AND DEBRIDEMENT ABSCESS: SHX5252

## 2017-06-11 LAB — TYPE AND SCREEN
ABO/RH(D): A POS
Antibody Screen: NEGATIVE

## 2017-06-11 LAB — COMPREHENSIVE METABOLIC PANEL
ALT: 63 U/L — ABNORMAL HIGH (ref 14–54)
AST: 48 U/L — ABNORMAL HIGH (ref 15–41)
Albumin: 2.7 g/dL — ABNORMAL LOW (ref 3.5–5.0)
Alkaline Phosphatase: 98 U/L (ref 38–126)
Anion gap: 9 (ref 5–15)
BUN: 7 mg/dL (ref 6–20)
CHLORIDE: 110 mmol/L (ref 101–111)
CO2: 21 mmol/L — AB (ref 22–32)
Calcium: 7.8 mg/dL — ABNORMAL LOW (ref 8.9–10.3)
Creatinine, Ser: 0.74 mg/dL (ref 0.44–1.00)
Glucose, Bld: 127 mg/dL — ABNORMAL HIGH (ref 65–99)
POTASSIUM: 3.7 mmol/L (ref 3.5–5.1)
SODIUM: 140 mmol/L (ref 135–145)
Total Bilirubin: 2.2 mg/dL — ABNORMAL HIGH (ref 0.3–1.2)
Total Protein: 5.8 g/dL — ABNORMAL LOW (ref 6.5–8.1)

## 2017-06-11 LAB — CBC
HEMATOCRIT: 24.8 % — AB (ref 36.0–46.0)
HEMATOCRIT: 24.3 % — AB (ref 36.0–46.0)
HEMOGLOBIN: 8.7 g/dL — AB (ref 12.0–15.0)
Hemoglobin: 8.4 g/dL — ABNORMAL LOW (ref 12.0–15.0)
MCH: 29.9 pg (ref 26.0–34.0)
MCH: 31.3 pg (ref 26.0–34.0)
MCHC: 33.9 g/dL (ref 30.0–36.0)
MCHC: 35.8 g/dL (ref 30.0–36.0)
MCV: 87.4 fL (ref 78.0–100.0)
MCV: 88.3 fL (ref 78.0–100.0)
Platelets: 184 10*3/uL (ref 150–400)
Platelets: 211 10*3/uL (ref 150–400)
RBC: 2.81 MIL/uL — AB (ref 3.87–5.11)
RBC: 2.78 MIL/uL — AB (ref 3.87–5.11)
RDW: 13.3 % (ref 11.5–15.5)
RDW: 13.6 % (ref 11.5–15.5)
WBC: 17.8 10*3/uL — AB (ref 4.0–10.5)
WBC: 14.2 10*3/uL — AB (ref 4.0–10.5)

## 2017-06-11 LAB — ABO/RH: ABO/RH(D): A POS

## 2017-06-11 LAB — HIV ANTIBODY (ROUTINE TESTING W REFLEX): HIV SCREEN 4TH GENERATION: NONREACTIVE

## 2017-06-11 SURGERY — IRRIGATION AND DEBRIDEMENT ABSCESS
Anesthesia: General | Laterality: Left

## 2017-06-11 MED ORDER — HYDROMORPHONE HCL 1 MG/ML IJ SOLN
INTRAMUSCULAR | Status: AC
  Administered 2017-06-11: 12:00:00
  Filled 2017-06-11: qty 1

## 2017-06-11 MED ORDER — ALBUMIN HUMAN 5 % IV SOLN
INTRAVENOUS | Status: DC | PRN
  Administered 2017-06-11: 10:00:00 via INTRAVENOUS

## 2017-06-11 MED ORDER — HYDROMORPHONE HCL 1 MG/ML IJ SOLN
0.2500 mg | INTRAMUSCULAR | Status: DC | PRN
  Administered 2017-06-11 (×2): 0.5 mg via INTRAVENOUS

## 2017-06-11 MED ORDER — LACTATED RINGERS IV SOLN
INTRAVENOUS | Status: DC | PRN
  Administered 2017-06-11: 10:00:00 via INTRAVENOUS

## 2017-06-11 MED ORDER — DEXAMETHASONE SODIUM PHOSPHATE 10 MG/ML IJ SOLN
INTRAMUSCULAR | Status: DC | PRN
  Administered 2017-06-11: 10 mg via INTRAVENOUS

## 2017-06-11 MED ORDER — LIDOCAINE 2% (20 MG/ML) 5 ML SYRINGE
INTRAMUSCULAR | Status: DC | PRN
  Administered 2017-06-11: 60 mg via INTRAVENOUS

## 2017-06-11 MED ORDER — PHENYLEPHRINE HCL 10 MG/ML IJ SOLN
INTRAMUSCULAR | Status: DC | PRN
  Administered 2017-06-11: 75 ug/min via INTRAVENOUS

## 2017-06-11 MED ORDER — ONDANSETRON HCL 4 MG/2ML IJ SOLN
INTRAMUSCULAR | Status: DC | PRN
Start: 2017-06-11 — End: 2017-06-11
  Administered 2017-06-11: 4 mg via INTRAVENOUS

## 2017-06-11 MED ORDER — SUCCINYLCHOLINE CHLORIDE 200 MG/10ML IV SOSY
PREFILLED_SYRINGE | INTRAVENOUS | Status: DC | PRN
  Administered 2017-06-11: 100 mg via INTRAVENOUS

## 2017-06-11 MED ORDER — LIP MEDEX EX OINT
TOPICAL_OINTMENT | CUTANEOUS | Status: AC
  Administered 2017-06-11: 12:00:00
  Filled 2017-06-11: qty 7

## 2017-06-11 MED ORDER — PIPERACILLIN-TAZOBACTAM 3.375 G IVPB
INTRAVENOUS | Status: AC
  Filled 2017-06-11: qty 50

## 2017-06-11 MED ORDER — SODIUM CHLORIDE 0.9 % IV BOLUS (SEPSIS)
1000.0000 mL | Freq: Once | INTRAVENOUS | Status: AC
  Administered 2017-06-11: 1000 mL via INTRAVENOUS

## 2017-06-11 MED ORDER — FENTANYL CITRATE (PF) 250 MCG/5ML IJ SOLN
INTRAMUSCULAR | Status: AC
  Filled 2017-06-11: qty 5

## 2017-06-11 MED ORDER — MORPHINE SULFATE (PF) 4 MG/ML IV SOLN
1.0000 mg | INTRAVENOUS | Status: DC | PRN
Start: 2017-06-11 — End: 2017-06-17
  Administered 2017-06-13: 4 mg via INTRAVENOUS
  Administered 2017-06-13: 2 mg via INTRAVENOUS
  Administered 2017-06-13: 4 mg via INTRAVENOUS
  Administered 2017-06-13: 2 mg via INTRAVENOUS
  Administered 2017-06-15 – 2017-06-16 (×3): 4 mg via INTRAVENOUS
  Filled 2017-06-11 (×7): qty 1

## 2017-06-11 MED ORDER — 0.9 % SODIUM CHLORIDE (POUR BTL) OPTIME
TOPICAL | Status: DC | PRN
  Administered 2017-06-11: 1000 mL

## 2017-06-11 MED ORDER — PROMETHAZINE HCL 25 MG/ML IJ SOLN: 6.2500 mg | INTRAMUSCULAR | Status: DC | PRN

## 2017-06-11 MED ORDER — FENTANYL CITRATE (PF) 250 MCG/5ML IJ SOLN
INTRAMUSCULAR | Status: DC | PRN
  Administered 2017-06-11 (×3): 50 ug via INTRAVENOUS

## 2017-06-11 MED ORDER — OXYCODONE HCL 5 MG PO TABS: 5.0000 mg | ORAL_TABLET | ORAL | Status: DC | PRN

## 2017-06-11 MED ORDER — BUPIVACAINE-EPINEPHRINE (PF) 0.5% -1:200000 IJ SOLN
INTRAMUSCULAR | Status: AC
  Filled 2017-06-11: qty 30

## 2017-06-11 MED ORDER — PHENYLEPHRINE 40 MCG/ML (10ML) SYRINGE FOR IV PUSH (FOR BLOOD PRESSURE SUPPORT)
PREFILLED_SYRINGE | INTRAVENOUS | Status: DC | PRN
  Administered 2017-06-11: 160 ug via INTRAVENOUS
  Administered 2017-06-11: 120 ug via INTRAVENOUS

## 2017-06-11 MED ORDER — MIDAZOLAM HCL 2 MG/2ML IJ SOLN
INTRAMUSCULAR | Status: AC
  Filled 2017-06-11: qty 2

## 2017-06-11 MED ORDER — PROPOFOL 10 MG/ML IV BOLUS
INTRAVENOUS | Status: AC
  Filled 2017-06-11: qty 20

## 2017-06-11 MED ORDER — MIDAZOLAM HCL 2 MG/2ML IJ SOLN
INTRAMUSCULAR | Status: DC | PRN
Start: 2017-06-11 — End: 2017-06-11
  Administered 2017-06-11: 2 mg via INTRAVENOUS

## 2017-06-11 MED ORDER — PROPOFOL 10 MG/ML IV BOLUS
INTRAVENOUS | Status: DC | PRN
  Administered 2017-06-11: 80 mg via INTRAVENOUS

## 2017-06-11 SURGICAL SUPPLY — 20 items
BNDG GAUZE ELAST 4 BULKY (GAUZE/BANDAGES/DRESSINGS) ×3 IMPLANT
DRAPE LAPAROSCOPIC ABDOMINAL (DRAPES) ×3 IMPLANT
DRAPE SHEET LG 3/4 BI-LAMINATE (DRAPES) IMPLANT
DRSG PAD ABDOMINAL 8X10 ST (GAUZE/BANDAGES/DRESSINGS) ×9 IMPLANT
ELECT REM PT RETURN 15FT ADLT (MISCELLANEOUS) ×3 IMPLANT
GAUZE PACKING 1 X5 YD ST (GAUZE/BANDAGES/DRESSINGS) IMPLANT
GAUZE PACKING 1/2X5YD (GAUZE/BANDAGES/DRESSINGS) IMPLANT
GAUZE PACKING 2X5 YD STRL (GAUZE/BANDAGES/DRESSINGS) IMPLANT
GAUZE SPONGE 4X4 12PLY STRL (GAUZE/BANDAGES/DRESSINGS) IMPLANT
GLOVE ECLIPSE 8.0 STRL XLNG CF (GLOVE) ×3 IMPLANT
GLOVE INDICATOR 8.0 STRL GRN (GLOVE) ×3 IMPLANT
GOWN STRL REUS W/TWL XL LVL3 (GOWN DISPOSABLE) ×6 IMPLANT
KIT BASIN OR (CUSTOM PROCEDURE TRAY) ×3 IMPLANT
PACK GENERAL/GYN (CUSTOM PROCEDURE TRAY) ×3 IMPLANT
PAD ABD 8X10 STRL (GAUZE/BANDAGES/DRESSINGS) ×3 IMPLANT
SWAB COLLECTION DEVICE MRSA (MISCELLANEOUS) ×3 IMPLANT
SWAB CULTURE ESWAB REG 1ML (MISCELLANEOUS) ×3 IMPLANT
TAPE CLOTH SURG 6X10 WHT LF (GAUZE/BANDAGES/DRESSINGS) ×3 IMPLANT
TOWEL OR 17X26 10 PK STRL BLUE (TOWEL DISPOSABLE) ×3 IMPLANT
TOWEL OR NON WOVEN STRL DISP B (DISPOSABLE) ×3 IMPLANT

## 2017-06-11 NOTE — Anesthesia Postprocedure Evaluation (Signed)
Anesthesia Post Note  Patient: Frances [name]  Procedure(s) Performed: IRRIGATION, DRAINAGE AND DEBRIDEMENT LEFT FLANK NECROTIZING SOFT TISSUE INFECTION (Left )     Patient location during evaluation: PACU Anesthesia Type: General Level of consciousness: awake and alert Pain management: pain level controlled Vital Signs Assessment: post-procedure vital signs reviewed and stable Respiratory status: spontaneous breathing, nonlabored ventilation, respiratory function stable and patient connected to nasal cannula oxygen Cardiovascular status: blood pressure returned to baseline and stable Postop Assessment: no apparent nausea or vomiting Anesthetic complications: no    Last Vitals:  Vitals:   06/11/17 1138 06/11/17 1145  BP:  (!) 108/54  Pulse: (!) 108 (!) 104  Resp: 16 14  Temp:  36.9 C  SpO2: 100% 100%    Last Pain:  Vitals:   06/11/17 1130  TempSrc:   PainSc: 4                  Bayle Calvo S

## 2017-06-11 NOTE — Anesthesia Preprocedure Evaluation (Addendum)
Anesthesia Evaluation  Patient identified by MRN, date of birth, ID band Patient awake    Reviewed: Allergy & Precautions, NPO status , Patient's Chart, lab work & pertinent test results  Airway Mallampati: II  TM Distance: >3 FB Neck ROM: Full    Dental no notable dental hx. (+) Dental Advisory Given   Pulmonary neg pulmonary ROS,    Pulmonary exam normal breath sounds clear to auscultation       Cardiovascular negative cardio ROS Normal cardiovascular exam Rhythm:Regular Rate:Normal     Neuro/Psych negative neurological ROS  negative psych ROS   GI/Hepatic negative GI ROS, Neg liver ROS,   Endo/Other  negative endocrine ROS  Renal/GU negative Renal ROS  negative genitourinary   Musculoskeletal negative musculoskeletal ROS (+)   Abdominal   Peds negative pediatric ROS (+)  Hematology  (+) anemia ,   Anesthesia Other Findings   Reproductive/Obstetrics negative OB ROS                            Anesthesia Physical Anesthesia Plan  ASA: II  Anesthesia Plan: General   Post-op Pain Management:    Induction: Intravenous  PONV Risk Score and Plan: 3 and Ondansetron, Dexamethasone and Midazolam  Airway Management Planned: Oral ETT  Additional Equipment:   Intra-op Plan:   Post-operative Plan: Extubation in OR  Informed Consent: I have reviewed the patients History and Physical, chart, labs and discussed the procedure including the risks, benefits and alternatives for the proposed anesthesia with the patient or authorized representative who has indicated his/her understanding and acceptance.   Dental advisory given  Plan Discussed with: CRNA and Surgeon  Anesthesia Plan Comments:         Anesthesia Quick Evaluation

## 2017-06-11 NOTE — Op Note (Signed)
Operative Note  Frances [name] female 21 y.o. 06/11/2017  PREOPERATIVE DX:  Large left flank abscess  POSTOPERATIVE DX: Left flank abscess and necrotizing soft tissue infection  PROCEDURE:   Incision, drainage, and debridement of large left flank abscess and necrotizing soft tissue infection         Surgeon: Adolph PollackOSENBOWER,Mashelle Busick J   Assistants: none  Anesthesia: General endotracheal anesthesia  Indications:   This is a 21 year old female with a weeklong history of a progressively increasing left flank mass that was painful. She came to the emergency department with a mild leukocytosis and febrile and tachycardic. CT scan demonstrated what appeared to be significant cellulitis with no obvious abscess present. There is no drainage from the wound or fluctuance point first saw her yesterday. She was admitted to the hospital and started on intravenous antibiotics. This morning she has a lower blood pressure and white blood cell count is increasing. When I examined her, there is a punctate area in the wound draining pus. She is now brought to the operating room for the above procedure.    Procedure Detail:  Her left flank was marked my initial. She brought to the operative room placed supine on the operating table and general anesthetic was given. She was then turned in the right lateral decubitus position and padded appropriately. The right flank area was sterilely prepped and draped and a timeout was performed.  At the site of drainage and using electrocautery I made an incision and drained a large amount of pus. Some of this was sent for culture. There is a large cavity and I extended the incision medially to completely unroof the cavity. There is a small amount of necrotic subcutaneous tissue which I debrided. Some of this I sent the pathology. I broke up other loculations as well. Once I had adequately debrided the wound, I then controlled bleeding with electrocautery. The wound was then  irrigated.  Once hemostasis was adequate, the wound was packed tightly with saline moistened Kerlix gauze. ABD pads were then applied.  She tolerated the procedure without any apparent complications. Given the fact that she appears to have sepsis, she will be taken to the intensive care unit. She is in critical condition.  Estimated Blood Loss:  200 mL        Specimens: abscess fluid sent for culture. Necrotic subcutaneous tissue sent to pathology        Complications:  * No complications entered in OR log *

## 2017-06-11 NOTE — Transfer of Care (Signed)
Immediate Anesthesia Transfer of Care Note  Patient: Frances [name]  Procedure(s) Performed: IRRIGATION, DRAINAGE AND DEBRIDEMENT LEFT FLANK NECROTIZING SOFT TISSUE INFECTION (Left )  Patient Location: PACU  Anesthesia Type:General  Level of Consciousness: awake and alert   Airway & Oxygen Therapy: simple face mask  Post-op Assessment: Report given to RN and Post -op Vital signs reviewed and stable  Post vital signs: Reviewed and stable  Last Vitals:  Vitals:   06/11/17 0536 06/11/17 0828  BP: (!) 83/50 (!) 105/58  Pulse: (!) 102 (!) 117  Resp: 20   Temp: 36.8 C 37.4 C  SpO2: 100% 100%    Last Pain:  Vitals:   06/11/17 0858  TempSrc:   PainSc: 3       Patients Stated Pain Goal: 3 (06/11/17 0858)  Complications: No apparent anesthesia complications

## 2017-06-11 NOTE — Progress Notes (Signed)
MD notified of patient's blood pressure 80s/50s. Given orders from 1L NS bolus.

## 2017-06-11 NOTE — Anesthesia Procedure Notes (Signed)
Date/Time: 06/11/2017 10:53 AM Performed by: Minerva EndsMIRARCHI, Brolin Dambrosia M Pre-anesthesia Checklist: Suction available and Patient being monitored Oxygen Delivery Method: Simple face mask Placement Confirmation: positive ETCO2 and breath sounds checked- equal and bilateral Dental Injury: Teeth and Oropharynx as per pre-operative assessment  Comments: Extubated to simple face mask

## 2017-06-11 NOTE — Anesthesia Procedure Notes (Signed)
Procedure Name: Intubation Date/Time: 06/11/2017 10:00 AM Performed by: Minerva EndsMIRARCHI, Mariapaula Krist M Pre-anesthesia Checklist: Patient identified, Emergency Drugs available, Suction available and Patient being monitored Patient Re-evaluated:Patient Re-evaluated prior to induction Oxygen Delivery Method: Circle System Utilized Preoxygenation: Pre-oxygenation with 100% oxygen Induction Type: IV induction and Rapid sequence Laryngoscope Size: Miller and 2 Grade View: Grade I Tube type: Oral Number of attempts: 1 Airway Equipment and Method: Stylet Placement Confirmation: ETT inserted through vocal cords under direct vision,  positive ETCO2 and breath sounds checked- equal and bilateral Secured at: 22 cm Tube secured with: Tape Dental Injury: Teeth and Oropharynx as per pre-operative assessment  Comments: Smooth IV induction  By Okey Dupreose-- intubation AM CRNA atraumatic --- teeth and mouth as preop--- blat BS Rose

## 2017-06-11 NOTE — Progress Notes (Signed)
Assessment Active Problems:   Cellulitis and abscess of trunk-CT did not show an obvious abscess but exam this morning is consistent with it; I asked her about trauma to the area and she reported that she did not remember any.  She has ABL anemia.  Hypotension is being treated with IVF.  This may an infected hematoma.   Plan:  To the OR this morning for incision and drainage of left flank abscess.  The procedure and risks were discussed with her.  Risks include but are not limited to bleeding, need for transfusion, need for more surgery, anesthesia, wound healing issues, need for wound care.  She seems to understand and agrees with the plan.   LOS: 0 days        Chief Complaint/Subjective: Having drainage from left flank area.  Objective: Vital signs in last 24 hours: Temp:  [98.2 F (36.8 C)-101.5 F (38.6 C)] 98.3 F (36.8 C) (10/31 0536) Pulse Rate:  [96-130] 102 (10/31 0536) Resp:  [12-22] 20 (10/31 0536) BP: (83-121)/(38-94) 83/50 (10/31 0536) SpO2:  [100 %] 100 % (10/31 0536) Weight:  [78.9 kg (174 lb)] 78.9 kg (174 lb) (10/30 1207) Last BM Date: 06/08/17  Intake/Output from previous day: 10/30 0701 - 10/31 0700 In: 3980 [P.O.:480; I.V.:1700; IV Piggyback:1800] Out: -  Intake/Output this shift: No intake/output data recorded.  PE: General- In NAD.  Awake and alert. Abdomen-Red, indurated area unchanged in size; this morning there is a punctate opening draining pus  Lab Results:   Recent Labs  06/10/17 0654 06/11/17 0526  WBC 11.8* 14.2*  HGB 10.8* 8.7*  HCT 31.3* 24.3*  PLT 243 184   BMET  Recent Labs  06/10/17 0654 06/11/17 0526  NA 136 140  K 3.0* 3.7  CL 102 110  CO2 23 21*  GLUCOSE 147* 127*  BUN 6 7  CREATININE 0.63 0.74  CALCIUM 8.4* 7.8*   PT/INR No results for input(s): LABPROT, INR in the last 72 hours. Comprehensive Metabolic Panel:    Component Value Date/Time   NA 140 06/11/2017 0526   NA 136 06/10/2017 0654   K 3.7 06/11/2017  0526   K 3.0 (L) 06/10/2017 0654   CL 110 06/11/2017 0526   CL 102 06/10/2017 0654   CO2 21 (L) 06/11/2017 0526   CO2 23 06/10/2017 0654   BUN 7 06/11/2017 0526   BUN 6 06/10/2017 0654   CREATININE 0.74 06/11/2017 0526   CREATININE 0.63 06/10/2017 0654   GLUCOSE 127 (H) 06/11/2017 0526   GLUCOSE 147 (H) 06/10/2017 0654   CALCIUM 7.8 (L) 06/11/2017 0526   CALCIUM 8.4 (L) 06/10/2017 0654   AST 48 (H) 06/11/2017 0526   AST 72 (H) 06/10/2017 0654   ALT 63 (H) 06/11/2017 0526   ALT 69 (H) 06/10/2017 0654   ALKPHOS 98 06/11/2017 0526   ALKPHOS 97 06/10/2017 0654   BILITOT 2.2 (H) 06/11/2017 0526   BILITOT 2.0 (H) 06/10/2017 0654   PROT 5.8 (L) 06/11/2017 0526   PROT 7.1 06/10/2017 0654   ALBUMIN 2.7 (L) 06/11/2017 0526   ALBUMIN 3.6 06/10/2017 0654     Studies/Results: Ct Abdomen Pelvis W Contrast  Result Date: 06/10/2017 CLINICAL DATA:  Left flank mass. EXAM: CT ABDOMEN AND PELVIS WITH CONTRAST TECHNIQUE: Multidetector CT imaging of the abdomen and pelvis was performed using the standard protocol following bolus administration of intravenous contrast. CONTRAST:  100mL ISOVUE-300 IOPAMIDOL (ISOVUE-300) INJECTION 61% COMPARISON:  None. FINDINGS: Musculoskeletal: There is an ill-defined 15 x 13 x  7 cm area of soft tissue stranding in the subcutaneous fat of the left flank containing smaller areas of more solid appearing density. The appearance is suggestive of hemorrhage in the soft tissues. Is there any bruising? Has the patient had any injections in that area? There is no rim enhancement to suggest abscess. Incidental note is made of a central soft disc protrusion at L4-5. Lower chest: Normal. Hepatobiliary: No focal liver abnormality is seen. No gallstones, gallbladder wall thickening, or biliary dilatation. Pancreas: Unremarkable. No pancreatic ductal dilatation or surrounding inflammatory changes. Spleen: Normal in size without focal abnormality. Adrenals/Urinary Tract: Adrenal glands  are unremarkable. Kidneys are normal, without renal calculi, focal lesion, or hydronephrosis. Bladder is unremarkable. Stomach/Bowel: Stomach is within normal limits. Appendix appears normal. No evidence of bowel wall thickening, distention, or inflammatory changes. Vascular/Lymphatic: No significant vascular findings are present. No enlarged abdominal or pelvic lymph nodes. Reproductive: Uterus and bilateral adnexa are unremarkable. Other: None IMPRESSION: 1. Large area of soft tissue stranding and ill-defined soft tissue density in the subcutaneous fat of the left flank centered at the L3-4 level. I think this most likely represents an area of hemorrhage and soft tissue contusion. Has the patient had any trauma to that area? 2. Less likely this could represent an area of focal cellulitis with small soft tissue abscesses although there is no rim enhancement of the areas of increased density. 3. Has the patient had any injections in that area? Electronically Signed   By: Francene Boyers M.D.   On: 06/10/2017 09:32    Anti-infectives: Anti-infectives    Start     Dose/Rate Route Frequency Ordered Stop   06/11/17 0400  vancomycin (VANCOCIN) IVPB 1000 mg/200 mL premix  Status:  Discontinued     1,000 mg 200 mL/hr over 60 Minutes Intravenous Every 8 hours 06/10/17 1545 06/10/17 1551   06/11/17 0100  vancomycin (VANCOCIN) IVPB 1000 mg/200 mL premix     1,000 mg 200 mL/hr over 60 Minutes Intravenous Every 8 hours 06/10/17 1551     06/10/17 1600  vancomycin (VANCOCIN) 1,500 mg in sodium chloride 0.9 % 500 mL IVPB     1,500 mg 250 mL/hr over 120 Minutes Intravenous  Once 06/10/17 1545 06/10/17 1824   06/10/17 1600  piperacillin-tazobactam (ZOSYN) IVPB 3.375 g     3.375 g 12.5 mL/hr over 240 Minutes Intravenous Every 8 hours 06/10/17 1545     06/10/17 1000  cefTRIAXone (ROCEPHIN) 1 g in dextrose 5 % 50 mL IVPB     1 g 100 mL/hr over 30 Minutes Intravenous  Once 06/10/17 0950 06/10/17 1104        Danetta Prom J 06/11/2017

## 2017-06-12 ENCOUNTER — Encounter (HOSPITAL_COMMUNITY): Payer: Self-pay | Admitting: Certified Registered Nurse Anesthetist

## 2017-06-12 ENCOUNTER — Inpatient Hospital Stay (HOSPITAL_COMMUNITY): Payer: Medicaid Other | Admitting: Certified Registered Nurse Anesthetist

## 2017-06-12 ENCOUNTER — Encounter (HOSPITAL_COMMUNITY): Admission: EM | Disposition: A | Discharge status: Home Health | Payer: Self-pay | Source: Home / Self Care

## 2017-06-12 HISTORY — PX: WOUND DEBRIDEMENT: SHX247

## 2017-06-12 LAB — URINE CULTURE
Culture: >=100000 — AB
Special Requests: NORMAL

## 2017-06-12 LAB — BASIC METABOLIC PANEL
ANION GAP: 8 (ref 5–15)
BUN: 15 mg/dL (ref 6–20)
CALCIUM: 8.6 mg/dL — AB (ref 8.9–10.3)
CO2: 17 mmol/L — AB (ref 22–32)
Chloride: 117 mmol/L — ABNORMAL HIGH (ref 101–111)
Creatinine, Ser: 1.7 mg/dL — ABNORMAL HIGH (ref 0.44–1.00)
GFR calc Af Amer: 49 mL/min — ABNORMAL LOW (ref >60)
GFR calc non Af Amer: 43 mL/min — ABNORMAL LOW (ref >60)
GLUCOSE: 116 mg/dL — AB (ref 65–99)
POTASSIUM: 5 mmol/L (ref 3.5–5.1)
Sodium: 142 mmol/L (ref 135–145)

## 2017-06-12 LAB — MRSA CULTURE: CULTURE: NOT DETECTED

## 2017-06-12 SURGERY — DEBRIDEMENT, WOUND
Anesthesia: General | Site: Flank | Laterality: Left

## 2017-06-12 MED ORDER — NEOSTIGMINE METHYLSULFATE 5 MG/5ML IV SOSY
PREFILLED_SYRINGE | INTRAVENOUS | Status: AC
  Filled 2017-06-12: qty 5

## 2017-06-12 MED ORDER — FENTANYL CITRATE (PF) 100 MCG/2ML IJ SOLN
INTRAMUSCULAR | Status: AC
  Filled 2017-06-12: qty 2

## 2017-06-12 MED ORDER — NEOSTIGMINE METHYLSULFATE 10 MG/10ML IV SOLN: INTRAVENOUS | Status: DC | PRN

## 2017-06-12 MED ORDER — LIDOCAINE 2% (20 MG/ML) 5 ML SYRINGE
INTRAMUSCULAR | Status: DC | PRN
  Administered 2017-06-12: 80 mg via INTRAVENOUS

## 2017-06-12 MED ORDER — DEXAMETHASONE SODIUM PHOSPHATE 10 MG/ML IJ SOLN
INTRAMUSCULAR | Status: DC | PRN
  Administered 2017-06-12: 10 mg via INTRAVENOUS

## 2017-06-12 MED ORDER — PROPOFOL 10 MG/ML IV BOLUS
INTRAVENOUS | Status: AC
  Filled 2017-06-12: qty 20

## 2017-06-12 MED ORDER — GLYCOPYRROLATE 0.2 MG/ML IJ SOLN: INTRAMUSCULAR | Status: DC | PRN

## 2017-06-12 MED ORDER — ONDANSETRON HCL 4 MG/2ML IJ SOLN
INTRAMUSCULAR | Status: AC
  Filled 2017-06-12: qty 2

## 2017-06-12 MED ORDER — TRAMADOL HCL 50 MG PO TABS
50.0000 mg | ORAL_TABLET | Freq: Four times a day (QID) | ORAL | Status: DC | PRN
  Administered 2017-06-16: 50 mg via ORAL
  Filled 2017-06-12: qty 1

## 2017-06-12 MED ORDER — LACTATED RINGERS IV SOLN: INTRAVENOUS | Status: DC

## 2017-06-12 MED ORDER — MIDAZOLAM HCL 5 MG/5ML IJ SOLN
INTRAMUSCULAR | Status: DC | PRN
  Administered 2017-06-12: 2 mg via INTRAVENOUS

## 2017-06-12 MED ORDER — MIDAZOLAM HCL 2 MG/2ML IJ SOLN
INTRAMUSCULAR | Status: AC
  Filled 2017-06-12: qty 2

## 2017-06-12 MED ORDER — DEXAMETHASONE SODIUM PHOSPHATE 10 MG/ML IJ SOLN
INTRAMUSCULAR | Status: AC
  Filled 2017-06-12: qty 1

## 2017-06-12 MED ORDER — ROCURONIUM BROMIDE 50 MG/5ML IV SOSY
PREFILLED_SYRINGE | INTRAVENOUS | Status: DC | PRN
  Administered 2017-06-12: 30 mg via INTRAVENOUS

## 2017-06-12 MED ORDER — SUCCINYLCHOLINE CHLORIDE 200 MG/10ML IV SOSY
PREFILLED_SYRINGE | INTRAVENOUS | Status: AC
  Filled 2017-06-12: qty 10

## 2017-06-12 MED ORDER — DEXTROSE-NACL 5-0.9 % IV SOLN
INTRAVENOUS | Status: DC
  Administered 2017-06-12 – 2017-06-14 (×4): via INTRAVENOUS

## 2017-06-12 MED ORDER — GLYCOPYRROLATE 0.2 MG/ML IV SOSY
PREFILLED_SYRINGE | INTRAVENOUS | Status: AC
  Filled 2017-06-12: qty 5

## 2017-06-12 MED ORDER — MEPERIDINE HCL 50 MG/ML IJ SOLN: 6.2500 mg | INTRAMUSCULAR | Status: DC | PRN

## 2017-06-12 MED ORDER — 0.9 % SODIUM CHLORIDE (POUR BTL) OPTIME
TOPICAL | Status: DC | PRN
  Administered 2017-06-12: 1000 mL

## 2017-06-12 MED ORDER — ONDANSETRON HCL 4 MG/2ML IJ SOLN
INTRAMUSCULAR | Status: DC | PRN
  Administered 2017-06-12: 4 mg via INTRAVENOUS

## 2017-06-12 MED ORDER — LIDOCAINE 2% (20 MG/ML) 5 ML SYRINGE
INTRAMUSCULAR | Status: AC
  Filled 2017-06-12: qty 5

## 2017-06-12 MED ORDER — HYDROMORPHONE HCL 1 MG/ML IJ SOLN
0.2500 mg | INTRAMUSCULAR | Status: DC | PRN
  Administered 2017-06-12 (×3): 0.5 mg via INTRAVENOUS

## 2017-06-12 MED ORDER — GLYCOPYRROLATE 0.2 MG/ML IV SOSY
PREFILLED_SYRINGE | INTRAVENOUS | Status: DC | PRN
  Administered 2017-06-12: 0.6 mg via INTRAVENOUS

## 2017-06-12 MED ORDER — PHENYLEPHRINE HCL 10 MG/ML IJ SOLN
INTRAMUSCULAR | Status: AC
  Filled 2017-06-12: qty 1

## 2017-06-12 MED ORDER — PHENYLEPHRINE HCL 10 MG/ML IJ SOLN
INTRAMUSCULAR | Status: DC | PRN
  Administered 2017-06-12: 100 ug/min via INTRAVENOUS

## 2017-06-12 MED ORDER — FENTANYL CITRATE (PF) 100 MCG/2ML IJ SOLN
INTRAMUSCULAR | Status: DC | PRN
  Administered 2017-06-12 (×3): 50 ug via INTRAVENOUS

## 2017-06-12 MED ORDER — PROPOFOL 10 MG/ML IV BOLUS
INTRAVENOUS | Status: DC | PRN
  Administered 2017-06-12: 120 mg via INTRAVENOUS

## 2017-06-12 MED ORDER — ONDANSETRON HCL 4 MG/2ML IJ SOLN: 4.0000 mg | Freq: Once | INTRAMUSCULAR | Status: DC | PRN

## 2017-06-12 MED ORDER — LACTATED RINGERS IV SOLN
INTRAVENOUS | Status: DC | PRN
  Administered 2017-06-12: 07:00:00 via INTRAVENOUS

## 2017-06-12 MED ORDER — HYDROMORPHONE HCL 1 MG/ML IJ SOLN
INTRAMUSCULAR | Status: AC
  Filled 2017-06-12: qty 2

## 2017-06-12 MED ORDER — NEOSTIGMINE METHYLSULFATE 5 MG/5ML IV SOSY
PREFILLED_SYRINGE | INTRAVENOUS | Status: DC | PRN
Start: 2017-06-12 — End: 2017-06-12
  Administered 2017-06-12: 4 mg via INTRAVENOUS

## 2017-06-12 MED ORDER — ROCURONIUM BROMIDE 50 MG/5ML IV SOSY
PREFILLED_SYRINGE | INTRAVENOUS | Status: AC
  Filled 2017-06-12: qty 5

## 2017-06-12 SURGICAL SUPPLY — 23 items
BNDG GAUZE ELAST 4 BULKY (GAUZE/BANDAGES/DRESSINGS) IMPLANT
CHLORAPREP W/TINT 26ML (MISCELLANEOUS) ×3 IMPLANT
DRAPE LAPAROSCOPIC ABDOMINAL (DRAPES) ×3 IMPLANT
DRSG KUZMA FLUFF (GAUZE/BANDAGES/DRESSINGS) ×3 IMPLANT
DRSG PAD ABDOMINAL 8X10 ST (GAUZE/BANDAGES/DRESSINGS) ×3 IMPLANT
ELECT REM PT RETURN 15FT ADLT (MISCELLANEOUS) ×3 IMPLANT
GLOVE BIO SURGEON STRL SZ 6.5 (GLOVE) ×2 IMPLANT
GLOVE BIO SURGEONS STRL SZ 6.5 (GLOVE) ×1
GLOVE BIOGEL PI IND STRL 6.5 (GLOVE) ×1 IMPLANT
GLOVE BIOGEL PI INDICATOR 6.5 (GLOVE) ×2
GLOVE ECLIPSE 8.0 STRL XLNG CF (GLOVE) ×3 IMPLANT
GLOVE INDICATOR 8.0 STRL GRN (GLOVE) ×3 IMPLANT
GOWN STRL REUS W/TWL XL LVL3 (GOWN DISPOSABLE) ×6 IMPLANT
KIT BASIN OR (CUSTOM PROCEDURE TRAY) ×3 IMPLANT
PACK GENERAL/GYN (CUSTOM PROCEDURE TRAY) ×3 IMPLANT
PAD ABD 8X10 STRL (GAUZE/BANDAGES/DRESSINGS) ×6 IMPLANT
SUT MNCRL AB 4-0 PS2 18 (SUTURE) IMPLANT
SUT VIC AB 3-0 SH 27 (SUTURE)
SUT VIC AB 3-0 SH 27XBRD (SUTURE) IMPLANT
SWAB CULTURE ESWAB REG 1ML (MISCELLANEOUS) IMPLANT
TAPE CLOTH SURG 6X10 WHT LF (GAUZE/BANDAGES/DRESSINGS) ×3 IMPLANT
TOWEL OR 17X26 10 PK STRL BLUE (TOWEL DISPOSABLE) ×3 IMPLANT
TOWEL OR NON WOVEN STRL DISP B (DISPOSABLE) ×3 IMPLANT

## 2017-06-12 NOTE — Op Note (Signed)
Operative Note  Frances ParadiseCaaliegh N [name] female 21 y.o. 06/12/2017  PREOPERATIVE DX:  Left flank abscess and necrotizing soft tissue infection  POSTOPERATIVE DX:  Same  PROCEDURE:   Left flank wound debridement (3 cm of subcutaneous tissue and muscle) and dressing change.         Surgeon: Adolph PollackOSENBOWER,Lorry Furber J   Assistants: None  Anesthesia: General endotracheal anesthesia  Indications:   This is a 21 year old female with a large left flank abscess and necrotizing soft tissue infection. She underwent incision and drainage and debridement yesterday. She is brought back to the operating room today for dressing change and further debridement if needed.    Procedure Detail:  She was brought to the operating room placed supine on the table and general anesthetic was given. She was turned into the right lateral decubitus position on a beanbag and padded appropriately. The area around the wound was sterilely prepped and draped. A timeout was performed.  The wound was inspected. There is some areas of necrotic tissue including subcutaneous fat and muscle which were debrided. No pockets of pus were found. This was approximately 3 cm. This was done with electrocautery. Breast the wound looked viable. Bleeding from these sites was controlled with electrocautery. Wound was then irrigated with saline. The wound was then packed with saline moistened Kerlix gauze. A bulky dressing was applied.  She tolerated the procedure without apparent complications. She's taken to recovery in satisfactory condition. She'll be transferred to the floor and will start bedside dressing changes tomorrow. The wound will need to heal in by secondary intention.  Findings:  No pockets of residual pus found. 3 cm of necrotic subcutaneous tissue and muscle debrided with electrocautery.  Estimated Blood Loss:  less than 50 mL         Specimens: None        Complications:  * No complications entered in OR log *          Disposition: PACU - hemodynamically stable.         Condition: stable

## 2017-06-12 NOTE — Anesthesia Postprocedure Evaluation (Signed)
Anesthesia Post Note  Patient: Frances [name]  Procedure(s) Performed: DRESSING CHANGE AND DEBRIDEMENT OF WOUND (Left Flank)     Patient location during evaluation: PACU Anesthesia Type: General Level of consciousness: awake and alert Pain management: pain level controlled Vital Signs Assessment: post-procedure vital signs reviewed and stable Respiratory status: spontaneous breathing, nonlabored ventilation, respiratory function stable and patient connected to nasal cannula oxygen Cardiovascular status: blood pressure returned to baseline and stable Postop Assessment: no apparent nausea or vomiting Anesthetic complications: no    Last Vitals:  Vitals:   06/12/17 0900 06/12/17 0915  BP: 121/76 (!) 102/57  Pulse: 73 (!) 58  Resp: 15 13  Temp:    SpO2: 100% 100%    Last Pain:  Vitals:   06/12/17 0900  TempSrc:   PainSc: 5                  Clois Treanor DAVID

## 2017-06-12 NOTE — Anesthesia Preprocedure Evaluation (Signed)
Anesthesia Evaluation  Patient identified by MRN, date of birth, ID band Patient awake    Reviewed: Allergy & Precautions, NPO status , Patient's Chart, lab work & pertinent test results  Airway Mallampati: I  TM Distance: >3 FB Neck ROM: Full    Dental   Pulmonary asthma ,    Pulmonary exam normal        Cardiovascular Normal cardiovascular exam     Neuro/Psych Seizures -,     GI/Hepatic   Endo/Other    Renal/GU      Musculoskeletal   Abdominal   Peds  Hematology   Anesthesia Other Findings   Reproductive/Obstetrics                             Anesthesia Physical Anesthesia Plan  ASA: III  Anesthesia Plan: General   Post-op Pain Management:    Induction: Intravenous  PONV Risk Score and Plan: 3 and Ondansetron, Dexamethasone, Midazolam and Treatment may vary due to age or medical condition  Airway Management Planned: Oral ETT  Additional Equipment:   Intra-op Plan:   Post-operative Plan: Extubation in OR  Informed Consent: I have reviewed the patients History and Physical, chart, labs and discussed the procedure including the risks, benefits and alternatives for the proposed anesthesia with the patient or authorized representative who has indicated his/her understanding and acceptance.     Plan Discussed with: CRNA and Surgeon  Anesthesia Plan Comments:         Anesthesia Quick Evaluation

## 2017-06-12 NOTE — Transfer of Care (Signed)
Immediate Anesthesia Transfer of Care Note  Patient: Frances [name]  Procedure(s) Performed: DRESSING CHANGE AND DEBRIDEMENT OF WOUND (Left Flank)  Patient Location: PACU  Anesthesia Type:General  Level of Consciousness: awake, alert  and oriented  Airway & Oxygen Therapy: Patient Spontanous Breathing and Patient connected to face mask oxygen  Post-op Assessment: Report given to RN and Post -op Vital signs reviewed and stable  Post vital signs: Reviewed and stable  Last Vitals:  Vitals:   06/12/17 0421 06/12/17 0500  BP:  107/69  Pulse:  74  Resp:  14  Temp: 36.6 C 36.6 C  SpO2:  100%    Last Pain:  Vitals:   06/12/17 0500  TempSrc: Oral  PainSc: Asleep      Patients Stated Pain Goal: 3 (06/11/17 0858)  Complications: No apparent anesthesia complications

## 2017-06-12 NOTE — Progress Notes (Signed)
Pharmacy Brief Note:  Received [facility] alert that SCr has increased from 0.71 to 1.70 over past 24 hours. Current vancomycin dosage 1 gram IV q8h, next due at 0900.  Plan: 1. Hold vancomycin for now [facility] 2. ICU pharmacist will f/u with attending MD regarding potential for discontinuing vancomycin.  Elie Goodyandy Lorrin Nawrot, PharmD, BCPS Pager: [phone] 06/12/2017  5:59 AM

## 2017-06-12 NOTE — Anesthesia Procedure Notes (Signed)
Procedure Name: Intubation Date/Time: 06/12/2017 7:52 AM Performed by: Montel Clock Pre-anesthesia Checklist: Patient identified, Emergency Drugs available, Suction available, Patient being monitored and Timeout performed Patient Re-evaluated:Patient Re-evaluated prior to induction Oxygen Delivery Method: Circle system utilized Preoxygenation: Pre-oxygenation with 100% oxygen Induction Type: IV induction Ventilation: Mask ventilation without difficulty Laryngoscope Size: Mac and 3 Grade View: Grade I Tube type: Oral Tube size: 7.5 mm Number of attempts: 1 Airway Equipment and Method: Stylet Placement Confirmation: ETT inserted through vocal cords under direct vision,  positive ETCO2 and breath sounds checked- equal and bilateral Secured at: 21 cm Tube secured with: Tape Dental Injury: Teeth and Oropharynx as per pre-operative assessment

## 2017-06-12 NOTE — Progress Notes (Signed)
Assessment Large left flank abscess and necrotizing soft tissue infection s/p I/D/debridement 06/11/17-more stable after procedure   AKI due to Vancomycin toxicity  Plan:  To OR today for dressing change and possible repeat debridement.  Monitor creatinine.  Change IVF.   LOS: 1 day     Day of Surgery  Chief Complaint/Subjective: A little sore  Objective: Vital signs in last 24 hours: Temp:  [97.9 F (36.6 C)-99.3 F (37.4 C)] 97.9 F (36.6 C) (11/01 0500) Pulse Rate:  [47-117] 74 (11/01 0500) Resp:  [0-23] 14 (11/01 0500) BP: (97-113)/(54-86) 107/69 (11/01 0500) SpO2:  [99 %-100 %] 100 % (11/01 0500) Weight:  [78.9 kg (174 lb)] 78.9 kg (174 lb) (10/31 0858) Last BM Date: 06/08/17  Intake/Output from previous day: 10/31 0701 - 11/01 0700 In: 3415 [P.O.:240; I.V.:2675; IV Piggyback:500] Out: 26 [Urine:1; Blood:25] Intake/Output this shift: No intake/output data recorded.  PE: General- In NAD.  Awake and alert. Abdomen-left flank dressing with some dried drainage.  Lab Results:   Recent Labs  06/11/17 0526 06/11/17 2339  WBC 14.2* 17.8*  HGB 8.7* 8.4*  HCT 24.3* 24.8*  PLT 184 211   BMET  Recent Labs  06/11/17 0526 06/12/17 0353  NA 140 142  K 3.7 5.0  CL 110 117*  CO2 21* 17*  GLUCOSE 127* 116*  BUN 7 15  CREATININE 0.74 1.70*  CALCIUM 7.8* 8.6*   PT/INR No results for input(s): LABPROT, INR in the last 72 hours. Comprehensive Metabolic Panel:    Component Value Date/Time   NA 142 06/12/2017 0353   NA 140 06/11/2017 0526   K 5.0 06/12/2017 0353   K 3.7 06/11/2017 0526   CL 117 (H) 06/12/2017 0353   CL 110 06/11/2017 0526   CO2 17 (L) 06/12/2017 0353   CO2 21 (L) 06/11/2017 0526   BUN 15 06/12/2017 0353   BUN 7 06/11/2017 0526   CREATININE 1.70 (H) 06/12/2017 0353   CREATININE 0.74 06/11/2017 0526   GLUCOSE 116 (H) 06/12/2017 0353   GLUCOSE 127 (H) 06/11/2017 0526   CALCIUM 8.6 (L) 06/12/2017 0353   CALCIUM 7.8 (L) 06/11/2017 0526    AST 48 (H) 06/11/2017 0526   AST 72 (H) 06/10/2017 0654   ALT 63 (H) 06/11/2017 0526   ALT 69 (H) 06/10/2017 0654   ALKPHOS 98 06/11/2017 0526   ALKPHOS 97 06/10/2017 0654   BILITOT 2.2 (H) 06/11/2017 0526   BILITOT 2.0 (H) 06/10/2017 0654   PROT 5.8 (L) 06/11/2017 0526   PROT 7.1 06/10/2017 0654   ALBUMIN 2.7 (L) 06/11/2017 0526   ALBUMIN 3.6 06/10/2017 0654     Studies/Results: Ct Abdomen Pelvis W Contrast  Result Date: 06/10/2017 CLINICAL DATA:  Left flank mass. EXAM: CT ABDOMEN AND PELVIS WITH CONTRAST TECHNIQUE: Multidetector CT imaging of the abdomen and pelvis was performed using the standard protocol following bolus administration of intravenous contrast. CONTRAST:  100mL ISOVUE-300 IOPAMIDOL (ISOVUE-300) INJECTION 61% COMPARISON:  None. FINDINGS: Musculoskeletal: There is an ill-defined 15 x 13 x 7 cm area of soft tissue stranding in the subcutaneous fat of the left flank containing smaller areas of more solid appearing density. The appearance is suggestive of hemorrhage in the soft tissues. Is there any bruising? Has the patient had any injections in that area? There is no rim enhancement to suggest abscess. Incidental note is made of a central soft disc protrusion at L4-5. Lower chest: Normal. Hepatobiliary: No focal liver abnormality is seen. No gallstones, gallbladder wall thickening, or biliary  dilatation. Pancreas: Unremarkable. No pancreatic ductal dilatation or surrounding inflammatory changes. Spleen: Normal in size without focal abnormality. Adrenals/Urinary Tract: Adrenal glands are unremarkable. Kidneys are normal, without renal calculi, focal lesion, or hydronephrosis. Bladder is unremarkable. Stomach/Bowel: Stomach is within normal limits. Appendix appears normal. No evidence of bowel wall thickening, distention, or inflammatory changes. Vascular/Lymphatic: No significant vascular findings are present. No enlarged abdominal or pelvic lymph nodes. Reproductive: Uterus and  bilateral adnexa are unremarkable. Other: None IMPRESSION: 1. Large area of soft tissue stranding and ill-defined soft tissue density in the subcutaneous fat of the left flank centered at the L3-4 level. I think this most likely represents an area of hemorrhage and soft tissue contusion. Has the patient had any trauma to that area? 2. Less likely this could represent an area of focal cellulitis with small soft tissue abscesses although there is no rim enhancement of the areas of increased density. 3. Has the patient had any injections in that area? Electronically Signed   By: Francene Boyers M.D.   On: 06/10/2017 09:32    Anti-infectives: Anti-infectives    Start     Dose/Rate Route Frequency Ordered Stop   06/11/17 0928  piperacillin-tazobactam (ZOSYN) 3.375 GM/50ML IVPB    Comments:  Curlene Dolphin   : cabinet override      06/11/17 1610 06/11/17 1002   06/11/17 0400  vancomycin (VANCOCIN) IVPB 1000 mg/200 mL premix  Status:  Discontinued     1,000 mg 200 mL/hr over 60 Minutes Intravenous Every 8 hours 06/10/17 1545 06/10/17 1551   06/11/17 0100  vancomycin (VANCOCIN) IVPB 1000 mg/200 mL premix  Status:  Discontinued     1,000 mg 200 mL/hr over 60 Minutes Intravenous Every 8 hours 06/10/17 1551 06/12/17 0551   06/10/17 1600  vancomycin (VANCOCIN) 1,500 mg in sodium chloride 0.9 % 500 mL IVPB     1,500 mg 250 mL/hr over 120 Minutes Intravenous  Once 06/10/17 1545 06/10/17 1824   06/10/17 1600  [MAR Hold]  piperacillin-tazobactam (ZOSYN) IVPB 3.375 g     (MAR Hold since 06/12/17 0652)   3.375 g 12.5 mL/hr over 240 Minutes Intravenous Every 8 hours 06/10/17 1545     06/10/17 1000  cefTRIAXone (ROCEPHIN) 1 g in dextrose 5 % 50 mL IVPB     1 g 100 mL/hr over 30 Minutes Intravenous  Once 06/10/17 0950 06/10/17 1104       Tanashia Ciesla J 06/12/2017

## 2017-06-13 ENCOUNTER — Encounter (HOSPITAL_COMMUNITY): Payer: Self-pay | Admitting: General Surgery

## 2017-06-13 LAB — CBC
HEMATOCRIT: 25.3 % — AB (ref 36.0–46.0)
Hemoglobin: 8.4 g/dL — ABNORMAL LOW (ref 12.0–15.0)
MCH: 29.5 pg (ref 26.0–34.0)
MCHC: 33.2 g/dL (ref 30.0–36.0)
MCV: 88.8 fL (ref 78.0–100.0)
PLATELETS: 282 10*3/uL (ref 150–400)
RBC: 2.85 MIL/uL — ABNORMAL LOW (ref 3.87–5.11)
RDW: 14.1 % (ref 11.5–15.5)
WBC: 22.5 10*3/uL — ABNORMAL HIGH (ref 4.0–10.5)

## 2017-06-13 LAB — BASIC METABOLIC PANEL
Anion gap: 7 (ref 5–15)
BUN: 23 mg/dL — ABNORMAL HIGH (ref 6–20)
CALCIUM: 8.5 mg/dL — AB (ref 8.9–10.3)
CO2: 18 mmol/L — AB (ref 22–32)
CREATININE: 2.33 mg/dL — AB (ref 0.44–1.00)
Chloride: 115 mmol/L — ABNORMAL HIGH (ref 101–111)
GFR calc non Af Amer: 29 mL/min — ABNORMAL LOW (ref >60)
GFR, EST AFRICAN AMERICAN: 34 mL/min — AB (ref >60)
Glucose, Bld: 128 mg/dL — ABNORMAL HIGH (ref 65–99)
Potassium: 4.1 mmol/L (ref 3.5–5.1)
Sodium: 140 mmol/L (ref 135–145)

## 2017-06-13 LAB — HCG, QUANTITATIVE, PREGNANCY

## 2017-06-13 MED ORDER — FERROUS GLUCONATE 324 (38 FE) MG PO TABS
324.0000 mg | ORAL_TABLET | Freq: Two times a day (BID) | ORAL | Status: DC
  Administered 2017-06-13 – 2017-06-17 (×9): 324 mg via ORAL
  Filled 2017-06-13 (×10): qty 1

## 2017-06-13 MED ORDER — LINEZOLID 600 MG PO TABS
600.0000 mg | ORAL_TABLET | Freq: Two times a day (BID) | ORAL | Status: DC
  Filled 2017-06-13: qty 1

## 2017-06-13 MED ORDER — CEPHALEXIN 500 MG PO CAPS
500.0000 mg | ORAL_CAPSULE | Freq: Two times a day (BID) | ORAL | Status: DC
  Administered 2017-06-13 – 2017-06-17 (×8): 500 mg via ORAL
  Filled 2017-06-13 (×8): qty 1

## 2017-06-13 MED ORDER — LINEZOLID 100 MG/5ML PO SUSR: 600.0000 mg | Freq: Two times a day (BID) | ORAL | Status: DC

## 2017-06-13 MED ORDER — CEPHALEXIN 500 MG PO CAPS: 500.0000 mg | ORAL_CAPSULE | Freq: Two times a day (BID) | ORAL | Status: DC

## 2017-06-13 MED ORDER — LINEZOLID 600 MG PO TABS
600.0000 mg | ORAL_TABLET | Freq: Two times a day (BID) | ORAL | Status: AC
  Administered 2017-06-13 – 2017-06-16 (×8): 600 mg via ORAL
  Filled 2017-06-13 (×8): qty 1

## 2017-06-13 MED ORDER — LINEZOLID 600 MG PO TABS: 600.0000 mg | ORAL_TABLET | Freq: Two times a day (BID) | ORAL | Status: DC

## 2017-06-13 MED ORDER — DOCUSATE SODIUM 100 MG PO CAPS
100.0000 mg | ORAL_CAPSULE | Freq: Every day | ORAL | Status: DC
  Administered 2017-06-13 – 2017-06-16 (×4): 100 mg via ORAL
  Filled 2017-06-13 (×5): qty 1

## 2017-06-13 NOTE — Progress Notes (Signed)
[facility] Progress Note  1 Day Post-Op   Assessment/Plan   Left Flank abscess - S/P  Incision, drainage, and debridement of large left flank abscess and necrotizing soft tissue infection, Dr. Abbey Chattersosenbower, 10/31 - S/P  Left flank wound debridement (3 cm of subcutaneous tissue and muscle) and dressing change, Dr. Abbey Chattersosenbower, 11/1 - BID wet to dry dressing changes - culture showed staph, sensitivities pending  Iron deficiency anemia - monitor - when pt able to eat will resume iron suppliments  AKI - creatinine 2.33 today up from 1.70 yesterday (11/1) - IVF @ 100cc/hr - likely from vancomycin which was stopped  - IVF & strict I&O's - daily BMP  UTI: - E. Coli sensitive to Zosyn  FEN: reg diet VTE: SCD's, no lovenox until Hg stable currently 8.4 ID: cultures Vanc (10/30-11/1), Zosyn (10/30>>) Foley: none Follow up: TBD  DISPO: HH orders placed, BID dressing changes, monitor creatinine. Restarted home iron. Hcg was 7.1 on admission, rechecking to confirm pregnancy    LOS: 2 days    Subjective:  CC: wound pain  No new complaints or issues overnight. Pt has been urinating. Twice overnight and again this am.   Objective: Vital signs in last 24 hours: Temp:  [97.5 F (36.4 C)-98.2 F (36.8 C)] 98 F (36.7 C) (11/02 0923) Pulse Rate:  [56-76] 76 (11/02 0923) Resp:  [14-17] 17 (11/02 0923) BP: (104-114)/(57-83) 114/83 (11/02 0923) SpO2:  [99 %-100 %] 100 % (11/02 0923) Last BM Date: 06/12/17 (Per pt report)  Intake/Output from previous day: 11/01 0701 - 11/02 0700 In: 2783.3 [P.O.:600; I.V.:2033.3; IV Piggyback:150] Out: 275 [Urine:275] Intake/Output this shift: No intake/output data recorded.  PE: Gen:  Alert, NAD, pleasant, cooperative Pulm:  Rate and effort normal Skin: warm and dry, wound to left flank is without purulent drainage, still some surrounding induration, repacked   Anti-infectives: Anti-infectives    Start     Dose/Rate  Route Frequency Ordered Stop   06/11/17 0928  piperacillin-tazobactam (ZOSYN) 3.375 GM/50ML IVPB    Comments:  Curlene DolphinWhitlow, Cheryl   : cabinet override      06/11/17 [id number] 06/11/17 1002   06/11/17 0400  vancomycin (VANCOCIN) IVPB 1000 mg/200 mL premix  Status:  Discontinued     1,000 mg 200 mL/hr over 60 Minutes Intravenous Every 8 hours 06/10/17 1545 06/10/17 1551   06/11/17 0100  vancomycin (VANCOCIN) IVPB 1000 mg/200 mL premix  Status:  Discontinued     1,000 mg 200 mL/hr over 60 Minutes Intravenous Every 8 hours 06/10/17 1551 06/12/17 0551   06/10/17 1600  vancomycin (VANCOCIN) 1,500 mg in sodium chloride 0.9 % 500 mL IVPB     1,500 mg 250 mL/hr over 120 Minutes Intravenous  Once 06/10/17 1545 06/10/17 1824   06/10/17 1600  piperacillin-tazobactam (ZOSYN) IVPB 3.375 g     3.375 g 12.5 mL/hr over 240 Minutes Intravenous Every 8 hours 06/10/17 1545     06/10/17 1000  cefTRIAXone (ROCEPHIN) 1 g in dextrose 5 % 50 mL IVPB     1 g 100 mL/hr over 30 Minutes Intravenous  Once 06/10/17 0950 06/10/17 1104      Lab Results:   Recent Labs  06/11/17 2339 06/13/17 0003  WBC 17.8* 22.5*  HGB 8.4* 8.4*  HCT 24.8* 25.3*  PLT 211 282   BMET  Recent Labs  06/12/17 0353 06/13/17 0547  NA 142 140  K 5.0 4.1  CL 117* 115*  CO2 17* 18*  GLUCOSE 116* 128*  BUN 15  23*  CREATININE 1.70* 2.33*  CALCIUM 8.6* 8.5*   PT/INR No results for input(s): LABPROT, INR in the last 72 hours. CMP     Component Value Date/Time   NA 140 06/13/2017 0547   K 4.1 06/13/2017 0547   CL 115 (H) 06/13/2017 0547   CO2 18 (L) 06/13/2017 0547   GLUCOSE 128 (H) 06/13/2017 0547   BUN 23 (H) 06/13/2017 0547   CREATININE 2.33 (H) 06/13/2017 0547   CALCIUM 8.5 (L) 06/13/2017 0547   PROT 5.8 (L) 06/11/2017 0526   ALBUMIN 2.7 (L) 06/11/2017 0526   AST 48 (H) 06/11/2017 0526   ALT 63 (H) 06/11/2017 0526   ALKPHOS 98 06/11/2017 0526   BILITOT 2.2 (H) 06/11/2017 0526   GFRNONAA 29 (L) 06/13/2017 0547   GFRAA  34 (L) 06/13/2017 0547   Lipase     Component Value Date/Time   LIPASE 26 10/15/2016 0750    Studies/Results: No results found.    Jerre Simon , [facility] 06/13/2017, 9:53 AM Pager: [phone] Consults: [phone] Mon-Fri 7:00 am-4:30 pm Sat-Sun 7:00 am-11:30 am

## 2017-06-13 NOTE — Progress Notes (Signed)
Discharge planning, spoke with patient at beside. [facility] confirmed [facility] will follow [facility] for d/c needs.  [phone]

## 2017-06-14 LAB — CBC
HCT: 29.2 % — ABNORMAL LOW (ref 36.0–46.0)
HEMOGLOBIN: 9.5 g/dL — AB (ref 12.0–15.0)
MCH: 29.6 pg (ref 26.0–34.0)
MCHC: 32.5 g/dL (ref 30.0–36.0)
MCV: 91 fL (ref 78.0–100.0)
Platelets: 312 10*3/uL (ref 150–400)
RBC: 3.21 MIL/uL — ABNORMAL LOW (ref 3.87–5.11)
RDW: 14.3 % (ref 11.5–15.5)
WBC: 12 10*3/uL — AB (ref 4.0–10.5)

## 2017-06-14 LAB — BASIC METABOLIC PANEL
ANION GAP: 8 (ref 5–15)
BUN: 21 mg/dL — ABNORMAL HIGH (ref 6–20)
CHLORIDE: 115 mmol/L — AB (ref 101–111)
CO2: 18 mmol/L — AB (ref 22–32)
Calcium: 7.9 mg/dL — ABNORMAL LOW (ref 8.9–10.3)
Creatinine, Ser: 2.47 mg/dL — ABNORMAL HIGH (ref 0.44–1.00)
GFR calc non Af Amer: 27 mL/min — ABNORMAL LOW (ref >60)
GFR, EST AFRICAN AMERICAN: 32 mL/min — AB (ref >60)
Glucose, Bld: 91 mg/dL (ref 65–99)
POTASSIUM: 3.4 mmol/L — AB (ref 3.5–5.1)
Sodium: 141 mmol/L (ref 135–145)

## 2017-06-14 MED ORDER — KCL IN DEXTROSE-NACL 20-5-0.9 MEQ/L-%-% IV SOLN
INTRAVENOUS | Status: DC
  Administered 2017-06-14 – 2017-06-15 (×4): via INTRAVENOUS
  Filled 2017-06-14 (×7): qty 1000

## 2017-06-14 MED ORDER — ENOXAPARIN SODIUM 30 MG/0.3ML ~~LOC~~ SOLN
30.0000 mg | SUBCUTANEOUS | Status: DC
  Administered 2017-06-14 – 2017-06-17 (×4): 30 mg via SUBCUTANEOUS
  Filled 2017-06-14 (×4): qty 0.3

## 2017-06-14 NOTE — Progress Notes (Signed)
Assessment  Large left Flank abscess - S/P  Incision, drainage, and debridement of large left flank abscess and necrotizing soft tissue infection, Dr. Abbey Chatters, 10/31 - S/P Left flank wound debridement (3 cm of subcutaneous tissue and muscle) and dressing change, Dr. 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 Coli on Keflex  FEN: reg diet VTE: Start Lovenox Follow up: TBD  DISPO: HH orders placed, BID dressing changes, monitor creatinine. Restarted home iron. Hcg was 7.1 on admission, and was undetectable yesterday so not pregnant  Plan: Continue daily dressing changes.  Continue Linezolid for MRSA abscess; monitor creatinine. Ambulate.   LOS: 3 days     2 Days Post-Op  Chief Complaint/Subjective: Wound soreness.  Objective: Vital signs in last 24 hours: Temp:  [98 F (36.7 C)-98.7 F (37.1 C)] 98.7 F (37.1 C) (11/03 0553) Pulse Rate:  [76-98] 93 (11/03 0553) Resp:  [17-18] 18 (11/03 0553) BP: (110-123)/(71-88) 123/88 (11/03 0553) SpO2:  [98 %-100 %] 98 % (11/03 0553) Last BM Date: 06/12/17  Intake/Output from previous day: 11/02 0701 - 11/03 0700 In: 3295 [P.O.:1020; I.V.:2275] Out: 1600 [Urine:1600] Intake/Output this shift: Total I/O In: 161.7 [I.V.:161.7] Out: -   PE: General- In NAD.  Awake and alert. Abdomen-left flank wound is clean, decreased redness and induration around wound   Lab Results:   Recent Labs  06/13/17 0003 06/14/17 0512  WBC 22.5* 12.0*  HGB 8.4* 9.5*  HCT 25.3* 29.2*  PLT 282 312   BMET  Recent Labs  06/13/17 0547 06/14/17 0512  NA 140 141  K 4.1 3.4*  CL 115* 115*  CO2 18* 18*  GLUCOSE 128* 91  BUN 23* 21*  CREATININE 2.33* 2.47*  CALCIUM 8.5* 7.9*   PT/INR No results for input(s): LABPROT, INR in the last 72 hours. Comprehensive Metabolic Panel:    Component Value Date/Time   NA 141 06/14/2017 0512   NA 140 06/13/2017 0547   K 3.4 (L) 06/14/2017 0512   K 4.1 06/13/2017 0547   CL 115 (H) 06/14/2017 0512   CL 115 (H) 06/13/2017 0547   CO2 18 (L) 06/14/2017 0512   CO2 18 (L) 06/13/2017 0547   BUN 21 (H) 06/14/2017 0512   BUN 23 (H) 06/13/2017 0547   CREATININE 2.47 (H) 06/14/2017 0512   CREATININE 2.33 (H) 06/13/2017 0547   GLUCOSE 91 06/14/2017 0512   GLUCOSE 128 (H) 06/13/2017 0547   CALCIUM 7.9 (L) 06/14/2017 0512   CALCIUM 8.5 (L) 06/13/2017 0547   AST 48 (H) 06/11/2017 0526   AST 72 (H) 06/10/2017 0654   ALT 63 (H) 06/11/2017 0526   ALT 69 (H) 06/10/2017 0654   ALKPHOS 98 06/11/2017 0526   ALKPHOS 97 06/10/2017 0654   BILITOT 2.2 (H) 06/11/2017 0526   BILITOT 2.0 (H) 06/10/2017 0654   PROT 5.8 (L) 06/11/2017 0526   PROT 7.1 06/10/2017 0654   ALBUMIN 2.7 (L) 06/11/2017 0526   ALBUMIN 3.6 06/10/2017 0654     Studies/Results: No results found.  Anti-infectives: Anti-infectives    Start     Dose/Rate Route Frequency  Ordered Stop   06/14/17 1000  cephALEXin (KEFLEX) capsule 500 mg  Status:  Discontinued     500 mg Oral Every 12 hours 06/13/17 1409 06/13/17 1409   06/13/17 2200  cephALEXin (KEFLEX) capsule 500 mg     500 mg Oral Every 12 hours 06/13/17 1409     06/13/17 1500  linezolid (ZYVOX) tablet 600 mg  Status:  Discontinued     600 mg Oral Every 12 hours 06/13/17 1416 06/13/17 1419   06/13/17 1500  linezolid (ZYVOX) tablet 600 mg     600 mg Oral Every 12 hours 06/13/17 1419 06/17/17 0959   06/13/17 1415  linezolid (ZYVOX) 100 MG/5ML suspension 600 mg  Status:  Discontinued     600 mg Oral Every 12 hours 06/13/17 1409 06/13/17 1415   06/13/17  1015  linezolid (ZYVOX) tablet 600 mg  Status:  Discontinued     600 mg Oral Every 12 hours 06/13/17 1004 06/13/17 1007   06/11/17 0928  piperacillin-tazobactam (ZOSYN) 3.375 GM/50ML IVPB    Comments:  Curlene DolphinWhitlow, Cheryl   : cabinet override      06/11/17 [id number] 06/11/17 1002   06/11/17 0400  vancomycin (VANCOCIN) IVPB 1000 mg/200 mL premix  Status:  Discontinued     1,000 mg 200 mL/hr over 60 Minutes Intravenous Every 8 hours 06/10/17 1545 06/10/17 1551   06/11/17 0100  vancomycin (VANCOCIN) IVPB 1000 mg/200 mL premix  Status:  Discontinued     1,000 mg 200 mL/hr over 60 Minutes Intravenous Every 8 hours 06/10/17 1551 06/12/17 0551   06/10/17 1600  vancomycin (VANCOCIN) 1,500 mg in sodium chloride 0.9 % 500 mL IVPB     1,500 mg 250 mL/hr over 120 Minutes Intravenous  Once 06/10/17 1545 06/10/17 1824   06/10/17 1600  piperacillin-tazobactam (ZOSYN) IVPB 3.375 g  Status:  Discontinued     3.375 g 12.5 mL/hr over 240 Minutes Intravenous Every 8 hours 06/10/17 1545 06/13/17 1409   06/10/17 1000  cefTRIAXone (ROCEPHIN) 1 g in dextrose 5 % 50 mL IVPB     1 g 100 mL/hr over 30 Minutes Intravenous  Once 06/10/17 0950 06/10/17 1104       Montrel Donahoe J 06/14/2017

## 2017-06-15 LAB — BASIC METABOLIC PANEL
Anion gap: 8 (ref 5–15)
BUN: 15 mg/dL (ref 6–20)
CO2: 18 mmol/L — AB (ref 22–32)
CREATININE: 2.18 mg/dL — AB (ref 0.44–1.00)
Calcium: 8 mg/dL — ABNORMAL LOW (ref 8.9–10.3)
Chloride: 115 mmol/L — ABNORMAL HIGH (ref 101–111)
GFR calc Af Amer: 37 mL/min — ABNORMAL LOW (ref >60)
GFR calc non Af Amer: 32 mL/min — ABNORMAL LOW (ref >60)
GLUCOSE: 89 mg/dL (ref 65–99)
POTASSIUM: 3.5 mmol/L (ref 3.5–5.1)
Sodium: 141 mmol/L (ref 135–145)

## 2017-06-15 NOTE — Progress Notes (Signed)
Assessment  Large left Flank abscess - S/P  Incision, drainage, and debridement of large left flank abscess and necrotizing soft tissue infection, Dr. Abbey Chatters, 10/31 - S/P Left flank wound debridement (3 cm of subcutaneous tissue and muscle) and dressing change, Dr. 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 Coli on Keflex  FEN: reg diet VTE: Start Lovenox Follow up: TBD  DISPO: HH orders placed, BID dressing changes, monitor creatinine.   Plan: Continue daily dressing changes. WOC consult for wound VAC tomorrow.  Continue Linezolid for MRSA abscess; monitor creatinine. Ambulate.   LOS: 4 days     3 Days Post-Op  Chief Complaint/Subjective: No complaints.  Objective: Vital signs in last 24 hours: Temp:  [98.2 F (36.8 C)-98.5 F (36.9 C)] 98.2 F (36.8 C) (11/04 0553) Pulse Rate:  [67-76] 76 (11/04 0553) Resp:  [18] 18 (11/04 0553) BP: (132-135)/(87-88) 135/87 (11/04 0553) SpO2:  [100 %] 100 % (11/04 0553) Last BM Date: 06/12/17  Intake/Output from previous day: 11/03 0701 - 11/04 0700 In: 2945 [P.O.:1020; I.V.:1925] Out: 3100 [Urine:3100] Intake/Output this shift: Total I/O In: 120 [P.O.:120] Out: 300 [Urine:300]  PE: General- In NAD.  Awake and alert. Abdomen-left flank wound is clean, decreased redness and induration around wound   Lab Results:  Recent Labs    06/13/17 0003 06/14/17 0512  WBC 22.5* 12.0*  HGB 8.4* 9.5*  HCT 25.3* 29.2*  PLT 282 312   BMET Recent Labs    06/14/17 0512  06/15/17 0544  NA 141 141  K 3.4* 3.5  CL 115* 115*  CO2 18* 18*  GLUCOSE 91 89  BUN 21* 15  CREATININE 2.47* 2.18*  CALCIUM 7.9* 8.0*   PT/INR No results for input(s): LABPROT, INR in the last 72 hours. Comprehensive Metabolic Panel:    Component Value Date/Time   NA 141 06/15/2017 0544   NA 141 06/14/2017 0512   K 3.5 06/15/2017 0544   K 3.4 (L) 06/14/2017 0512   CL 115 (H) 06/15/2017 0544   CL 115 (H) 06/14/2017 0512   CO2 18 (L) 06/15/2017 0544   CO2 18 (L) 06/14/2017 0512   BUN 15 06/15/2017 0544   BUN 21 (H) 06/14/2017 0512   CREATININE 2.18 (H) 06/15/2017 0544   CREATININE 2.47 (H) 06/14/2017 0512   GLUCOSE 89 06/15/2017 0544   GLUCOSE 91 06/14/2017 0512   CALCIUM 8.0 (L) 06/15/2017 0544   CALCIUM 7.9 (L) 06/14/2017 0512   AST 48 (H) 06/11/2017 0526   AST 72 (H) 06/10/2017 0654   ALT 63 (H) 06/11/2017 0526   ALT 69 (H) 06/10/2017 0654   ALKPHOS 98 06/11/2017 0526   ALKPHOS 97 06/10/2017 0654   BILITOT 2.2 (H) 06/11/2017 0526   BILITOT 2.0 (H) 06/10/2017 0654   PROT 5.8 (L) 06/11/2017 0526   PROT 7.1 06/10/2017 0654   ALBUMIN 2.7 (L) 06/11/2017 0526   ALBUMIN 3.6 06/10/2017 0654     Studies/Results: No results found.  Anti-infectives: Anti-infectives (From admission, onward)   Start     Dose/Rate Route Frequency Ordered Stop  06/14/17 1000  cephALEXin (KEFLEX) capsule 500 mg  Status:  Discontinued     500 mg Oral Every 12 hours 06/13/17 1409 06/13/17 1409   06/13/17 2200  cephALEXin (KEFLEX) capsule 500 mg     500 mg Oral Every 12 hours 06/13/17 1409     06/13/17 1500  linezolid (ZYVOX) tablet 600 mg  Status:  Discontinued     600 mg Oral Every 12 hours 06/13/17 1416 06/13/17 1419   06/13/17 1500  linezolid (ZYVOX) tablet 600 mg     600 mg Oral Every 12 hours 06/13/17 1419 06/17/17 0959   06/13/17 1415  linezolid (ZYVOX) 100 MG/5ML suspension 600 mg  Status:  Discontinued     600 mg Oral Every 12 hours 06/13/17 1409 06/13/17 1415   06/13/17 1015   linezolid (ZYVOX) tablet 600 mg  Status:  Discontinued     600 mg Oral Every 12 hours 06/13/17 1004 06/13/17 1007   06/11/17 0928  piperacillin-tazobactam (ZOSYN) 3.375 GM/50ML IVPB    Comments:  Curlene DolphinWhitlow, Cheryl   : cabinet override      06/11/17 [id number] 06/11/17 1002   06/11/17 0400  vancomycin (VANCOCIN) IVPB 1000 mg/200 mL premix  Status:  Discontinued     1,000 mg 200 mL/hr over 60 Minutes Intravenous Every 8 hours 06/10/17 1545 06/10/17 1551   06/11/17 0100  vancomycin (VANCOCIN) IVPB 1000 mg/200 mL premix  Status:  Discontinued     1,000 mg 200 mL/hr over 60 Minutes Intravenous Every 8 hours 06/10/17 1551 06/12/17 0551   06/10/17 1600  vancomycin (VANCOCIN) 1,500 mg in sodium chloride 0.9 % 500 mL IVPB     1,500 mg 250 mL/hr over 120 Minutes Intravenous  Once 06/10/17 1545 06/10/17 1824   06/10/17 1600  piperacillin-tazobactam (ZOSYN) IVPB 3.375 g  Status:  Discontinued     3.375 g 12.5 mL/hr over 240 Minutes Intravenous Every 8 hours 06/10/17 1545 06/13/17 1409   06/10/17 1000  cefTRIAXone (ROCEPHIN) 1 g in dextrose 5 % 50 mL IVPB     1 g 100 mL/hr over 30 Minutes Intravenous  Once 06/10/17 0950 06/10/17 1104       Vivienne Sangiovanni J 06/15/2017

## 2017-06-15 NOTE — Progress Notes (Signed)
Reading progress notes, it was noted that pt has a positive wound culture for MRSA. Contacted Judyann Munsonynthia Snider with Infectious disease, to confirm that pt should be placed on contact. Per Aram Beechamynthia, pt is to be placed on contact precaution and that it was OK for this writer to place the order as it is a nurse-driven protocol. Order placed and pt placed on contact precautions. Education provided.  Derinda SisVera Ryshawn Sanzone,rn.

## 2017-06-16 ENCOUNTER — Other Ambulatory Visit: Payer: Self-pay

## 2017-06-16 LAB — CBC
HCT: 26 % — ABNORMAL LOW (ref 36.0–46.0)
HEMOGLOBIN: 8.5 g/dL — AB (ref 12.0–15.0)
MCH: 29.1 pg (ref 26.0–34.0)
MCHC: 32.7 g/dL (ref 30.0–36.0)
MCV: 89 fL (ref 78.0–100.0)
PLATELETS: 419 10*3/uL — AB (ref 150–400)
RBC: 2.92 MIL/uL — AB (ref 3.87–5.11)
RDW: 13.5 % (ref 11.5–15.5)
WBC: 13.8 10*3/uL — ABNORMAL HIGH (ref 4.0–10.5)

## 2017-06-16 LAB — AEROBIC/ANAEROBIC CULTURE (SURGICAL/DEEP WOUND)

## 2017-06-16 LAB — BASIC METABOLIC PANEL
Anion gap: 6 (ref 5–15)
BUN: 12 mg/dL (ref 6–20)
CHLORIDE: 117 mmol/L — AB (ref 101–111)
CO2: 20 mmol/L — ABNORMAL LOW (ref 22–32)
CREATININE: 2.01 mg/dL — AB (ref 0.44–1.00)
Calcium: 8.1 mg/dL — ABNORMAL LOW (ref 8.9–10.3)
GFR, EST AFRICAN AMERICAN: 40 mL/min — AB (ref >60)
GFR, EST NON AFRICAN AMERICAN: 35 mL/min — AB (ref >60)
Glucose, Bld: 102 mg/dL — ABNORMAL HIGH (ref 65–99)
POTASSIUM: 3.8 mmol/L (ref 3.5–5.1)
SODIUM: 143 mmol/L (ref 135–145)

## 2017-06-16 LAB — AEROBIC/ANAEROBIC CULTURE W GRAM STAIN (SURGICAL/DEEP WOUND)

## 2017-06-16 NOTE — Consult Note (Signed)
WOC Nurse wound consult note Reason for Consult:Application of NPWT (VAC) therapy to left flank.  S/P I & D site.  Was getting BID NS Moist dressing changes. [facility] and will need to be connected to home unit once it arrives.  Is on hospital unit at this time.  Wound type:Infectious  Pressure Injury POA: NA Measurement: 12 cm x 2.3 cm x 3 cm  Wound bed: beefy red, nongranulating Drainage (amount, consistency, odor) minimal serosanguinous Periwound:intact Dressing procedure/placement/frequency:Premedicated for pain prior to procedure.  Wound is cleansed and one piece black foam applied to wound bed.  Covered with drape and seal is immediately achieved.  Change Mon/Wed/Fri.  Explained to patient and family at bedside that she would be connected to a smaller portable unit upon discharge.  WOC team will follow  while inpatient.  Maple HudsonKaren Shakai Dolley RN BSN CWON Pager [phone]

## 2017-06-16 NOTE — Progress Notes (Signed)
Noted that wound vac would be required at d/c, [facility]. [facility] signed by PA, [facility] awaiting [facility].

## 2017-06-16 NOTE — Discharge Instructions (Signed)
Negative Pressure Wound Therapy What is negative pressure wound therapy? Negative pressure wound therapy (NPWT) is a device that helps your wounds heal. NPWT helps your wound stay clean and healthy while it heals from the inside. NPWT uses a bandage (dressing) that is made of a sponge or gauze-like material. This dressing is placed on or inside the wound. The wound is then covered and sealed with a cover dressing that sticks to your skin (adhesive). This keeps air out. A tube connects the cover dressing to a small pump. The pump sucks fluid and germs from the wound. The pump also controls any odor coming from the wound. What are the benefits of NPWT? The benefits of NPWT may include:  Faster healing.  Lower risk of infection.  Decrease in swelling and how much fluid is in the wound.  Fewer dressing changes.  Ability to treat your wound at home.  Shorter hospital stay.  Less pain.  What are the risks of NPWT? NPWT is usually safe to use. The most common problem is skin irritation from the dressing adhesive, but there are many ways to help prevent this from happening. However, more serious problems can develop, such as:  Bleeding.  Infection.  Dehydration.  Pain.  What do I need to do to care for my wound?  Do not take off the dressing yourself unless told to do so by your health care provider.  Keep all follow-up visits as told by your health care provider. This is important.  A nurse will be coming to your home to change your wound vac.  Keep the area clean and dry.  Ask your health care provider for the best way to protect your skin from becoming irritated by the adhesive. What do I need to know about the pump?  Do not turn off the pump yourself unless told to do so by your health care provider, such as for bathing.  Do not turn off the pump for more than two hours. If the pump is off for more than two hours, the dressing will need to be changed.  If your health care  provider says it is okay to shower: ? Do not take the pump into the shower. ? Make sure the wound dressing is protected and sealed. The wound area must stay dry.  Check frequently that the machine is on, that the machine indicates the therapy is on, and that all clamps are open.  If the alarm sounds: ? Stay calm. ? Do not turn off the pump or do anything with the dressing. ? Call your health care provider right away if you cannot fix the problem. The alarm may go off because the battery is low, the dressing has a leak, or the fluid collection container is full. ? Explain to your health care provider what is happening. Follow his or her instructions. When should I seek medical care? Seek medical care if:  You have new pain.  You develop irritation, a rash, or itching around the wound or dressing.  You see new black or yellow tissue in your wound.  The dressing changes are painful or cause bleeding.  The pump has been off for more than two hours and you do not know how to change the dressing.  The pump alarm goes off and you do not know what to do.  When should I seek immediate medical care? Seek immediate medical care if:  You have a lot of bleeding.  You see a sudden change in  the color or texture of the drainage.  The wound breaks open.  You have severe pain.  You have signs of infection, such as: ? More redness, swelling, or pain. ? More fluid or blood. ? Warmth. ? Pus or a bad smell. ? Red streaks leading from wound. ? A fever.  This information is not intended to replace advice given to you by your health care provider. Make sure you discuss any questions you have with your health care provider. Document Released: 07/11/2008 Document Revised: 06/25/2016 Document Reviewed: 05/04/2015 Elsevier Interactive Patient Education  2018 [employer].   FAQs about MRSA (Methicillin-Resistant Staphylococcus aureus) What is MRSA? Staphylococcus aureus (pronounced  staff-ill-oh-KOK-us AW-ree-us), or "Staph" is a very common germ that about 1 out of every 3 people have on their skin or in their nose. This germ does not cause any problems for most people who have it on their skin. But sometimes it can cause serious infections such as skin or wound infections, pneumonia, or infections of the blood. Antibiotics are given to kill Staph germs when they cause infections. Some Staph are resistant, meaning they cannot be killed by some antibiotics. "Methicillin-resistant Staphylococcus aureus" or "MRSA" is a type of Staph that is resistant to some of the antibiotics that are often used to treat Staph infections. Who is most likely to get an MRSA infection? In the hospital, people who are more likely to get an MRSA infection are people who:  have other health conditions making them sick  have been in the hospital or a nursing home  have been treated with antibiotics.  People who are healthy and who have not been in the hospital or a nursing home can also get MRSA infections. These infections usually involve the skin. More information about this type of MRSA infection, known as "community-associated MRSA" infection, is available from the Centers for Disease Control and Prevention (CDC). ReportNation.uy How do I get an MRSA infection? People who have MRSA germs on their skin or who are infected with MRSA may be able to spread the germ to other people. MRSA can be passed on to bed linens, bed rails, bathroom fixtures, and medical equipment. It can spread to other people on contaminated equipment and on the hands of doctors, nurses, other healthcare providers and visitors. Can MRSA infections be treated? Yes, there are antibiotics that can kill MRSA germs. Some patients with MRSA abscesses may need surgery to drain the infection. Your healthcare provider will determine which treatments are best for you. What are some of the things that hospitals are doing to prevent  MRSA infections? To prevent MRSA infections, doctors, nurses and other healthcare providers:  Clean their hands with soap and water or an alcohol-based hand rub before and after caring for every patient.  Carefully clean hospital rooms and medical equipment.  Use Contact Precautions when caring for patients with MRSA. Contact Precautions mean: ? Whenever possible, patients with MRSA will have a single room or will share a room only with someone else who also has MRSA. ? Healthcare providers will put on gloves and wear a gown over their clothing while taking care of patients with MRSA. ? Visitors may also be asked to wear a gown and gloves. ? When leaving the room, hospital providers and visitors remove their gown and gloves and clean their hands. ? Patients on Contact Precautions are asked to stay in their hospital rooms as much as possible. They should not go to common areas, such as the gift shop  or cafeteria. They may go to other areas of the hospital for treatments and tests.  May test some patients to see if they have MRSA on their skin. This test involves rubbing a cotton-tipped swab in the patient's nostrils or on the skin.  What can I do to help prevent MRSA infections? In the hospital  Make sure that all doctors, nurses, and other healthcare providers clean their hands with soap and water or an alcohol-based hand rub before and after caring for you.  If you do not see your providers clean their hands, please ask them to do so. When you go home  If you have wounds or an intravascular device (such as a catheter or dialysis port) make sure that you know how to take care of them.  Can my friends and family get MRSA when they visit me? The chance of getting MRSA while visiting a person who has MRSA is very low. To decrease the chance of getting MRSA your family and friends should:  Clean their hands before they enter your room and when they leave.  Ask a healthcare provider if they  need to wear protective gowns and gloves when they visit you.  What do I need to do when I go home from the hospital? To prevent another MRSA infection and to prevent spreading MRSA to others:  Keep taking any antibiotics prescribed by your doctor. Don't take half-doses or stop before you complete your prescribed course.  Clean your hands often, especially before and after changing your wound dressing or bandage.  People who live with you should clean their hands often as well.  Keep any wounds clean and change bandages as instructed until healed.  Avoid sharing personal items such as towels or razors.  Wash and dry your clothes and bed linens in the warmest temperatures recommended on the labels.  Tell your healthcare providers that you have MRSA. This includes home health nurses and aides, therapists, and personnel in doctors' offices.  Your doctor may have more instructions for you.  If you have questions, please ask your doctor or nurse. Co-sponsored by The Society for Healthcare Epidemiology of [place] 7787735638); Infectious Diseases Society of America (IDSA); [facility] Association; Association for Professionals in Infection Control and Epidemiology (APIC); Centers for Disease Control and Prevention (CDC); and [employer]. This information is not intended to replace advice given to you by your health care provider. Make sure you discuss any questions you have with your health care provider. Document Released: 08/03/2013 Document Revised: 01/04/2016 Document Reviewed: 10/12/2014 Elsevier Interactive Patient Education  [employer].

## 2017-06-16 NOTE — Progress Notes (Signed)
Pt was discharged home today. Instructions were reviewed with patient, and questions were answered. Pt was taken to main entrance via wheelchair by NT.  

## 2017-06-16 NOTE — Progress Notes (Signed)
Pt was going home 11/5 but creatinine levels were 2.01, Iv was taken out, MD said it is ok for it to stay out, and the patient needs to drink a lot.

## 2017-06-16 NOTE — Progress Notes (Signed)
[facility] Progress Note  4 Days Post-Op   Assessment/Plan Large left Flank abscess - S/P Incision, drainage, and debridement of large left flank abscess and necrotizing soft tissue infection, Dr. Abbey Chattersosenbower, 10/31 - S/P Left flank wound debridement (3 cm of subcutaneous tissue and muscle) and dressing change, Dr. 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 Coli on Keflex  FEN: reg diet VTE: Lovenox Follow up: TBD  DISPO: HH orders placed, BID dressing changes, monitor creatinine.   Plan: Continue daily dressing changes. WOC consult for wound VAC today.  Continue Linezolid for MRSA abscess; monitor creatinine. Ambulate. Maybe home this afternoon      LOS: 5 days    Subjective:  CC: wound pain  No fever or chills, nausea or vomiting overnight. Family at bedside. Discussed wound vac with pt.   Objective: Vital signs in last 24 hours: Temp:  [98.2 F (36.8 C)-98.9 F (37.2 C)] 98.2 F (36.8 C) (11/05 0500) Pulse Rate:  [75-84] 84 (11/05 0500) Resp:  [18-20] 18 (11/05 0500) BP: (139-141)/(83-106) 139/86 (11/05 0500) SpO2:  [99 %-100 %] 100 % (11/05 0500) Last BM Date: 06/12/17  Intake/Output from previous day: 11/04 0701 - 11/05 0700 In: 480 [P.O.:480] Out: 2300 [Urine:2300] Intake/Output this shift: No intake/output data recorded.  PE: Gen:  Alert, NAD, pleasant, cooperative Card:  RRR, no M/G/R heard Pulm:  CTA, no W/R/R, rate and effort normal Skin: warm and dry,  wound to left flank is without purulent drainage, improved induration and erythema, good base of granulation tissue, repacked        Anti-infectives: Anti-infectives (From admission, onward)   Start     Dose/Rate Route Frequency Ordered Stop   06/14/17 1000  cephALEXin (KEFLEX) capsule 500 mg  Status:  Discontinued     500 mg Oral Every 12 hours 06/13/17 1409 06/13/17 1409   06/13/17 2200  cephALEXin (KEFLEX) capsule 500 mg     500 mg Oral Every 12 hours 06/13/17 1409     06/13/17 1500  linezolid (ZYVOX) tablet 600 mg  Status:  Discontinued     600 mg Oral Every 12 hours 06/13/17 1416 06/13/17 1419   06/13/17 1500  linezolid (ZYVOX) tablet 600 mg     600 mg Oral Every 12 hours 06/13/17 1419 06/17/17 0959   06/13/17 1415  linezolid (ZYVOX) 100 MG/5ML suspension 600 mg  Status:  Discontinued     600 mg Oral Every 12 hours 06/13/17 1409 06/13/17 1415   06/13/17 1015  linezolid (ZYVOX) tablet 600 mg  Status:  Discontinued     600 mg Oral Every 12 hours 06/13/17 1004 06/13/17 1007   06/11/17 0928  piperacillin-tazobactam (ZOSYN) 3.375 GM/50ML IVPB    Comments:  Curlene DolphinWhitlow, Cheryl   : cabinet override      06/11/17 [id number] 06/11/17 1002   06/11/17 0400  vancomycin (VANCOCIN) IVPB 1000 mg/200 mL premix  Status:  Discontinued     1,000 mg 200 mL/hr over 60 Minutes Intravenous Every 8 hours 06/10/17 1545 06/10/17 1551   06/11/17  0100  vancomycin (VANCOCIN) IVPB 1000 mg/200 mL premix  Status:  Discontinued     1,000 mg 200 mL/hr over 60 Minutes Intravenous Every 8 hours 06/10/17 1551 06/12/17 0551   06/10/17 1600  vancomycin (VANCOCIN) 1,500 mg in sodium chloride 0.9 % 500 mL IVPB     1,500 mg 250 mL/hr over 120 Minutes Intravenous  Once 06/10/17 1545 06/10/17 1824   06/10/17 1600  piperacillin-tazobactam (ZOSYN) IVPB 3.375 g  Status:  Discontinued     3.375 g 12.5 mL/hr over 240 Minutes Intravenous Every 8 hours 06/10/17 1545 06/13/17 1409   06/10/17 1000  cefTRIAXone (ROCEPHIN) 1 g in  dextrose 5 % 50 mL IVPB     1 g 100 mL/hr over 30 Minutes Intravenous  Once 06/10/17 0950 06/10/17 1104      Lab Results:  Recent Labs    06/14/17 0512 06/16/17 0553  WBC 12.0* 13.8*  HGB 9.5* 8.5*  HCT 29.2* 26.0*  PLT 312 419*   BMET Recent Labs    06/15/17 0544 06/16/17 0553  NA 141 143  K 3.5 3.8  CL 115* 117*  CO2 18* 20*  GLUCOSE 89 102*  BUN 15 12  CREATININE 2.18* 2.01*  CALCIUM 8.0* 8.1*   PT/INR No results for input(s): LABPROT, INR in the last 72 hours. CMP     Component Value Date/Time   NA 143 06/16/2017 0553   K 3.8 06/16/2017 0553   CL 117 (H) 06/16/2017 0553   CO2 20 (L) 06/16/2017 0553   GLUCOSE 102 (H) 06/16/2017 0553   BUN 12 06/16/2017 0553   CREATININE 2.01 (H) 06/16/2017 0553   CALCIUM 8.1 (L) 06/16/2017 0553   PROT 5.8 (L) 06/11/2017 0526   ALBUMIN 2.7 (L) 06/11/2017 0526   AST 48 (H) 06/11/2017 0526   ALT 63 (H) 06/11/2017 0526   ALKPHOS 98 06/11/2017 0526   BILITOT 2.2 (H) 06/11/2017 0526   GFRNONAA 35 (L) 06/16/2017 0553   GFRAA 40 (L) 06/16/2017 0553   Lipase     Component Value Date/Time   LIPASE 26 10/15/2016 0750    Studies/Results: No results found.    Jerre Simon , [facility] 06/16/2017, 10:09 AM Pager: [phone] Consults: [phone] Mon-Fri 7:00 am-4:30 pm Sat-Sun 7:00 am-11:30 am

## 2017-06-16 NOTE — Progress Notes (Signed)
Spoke with Mattie MarlinJessica Focht regarding pt d/c today.  Orders receive to keep patient one more night to recheck creatine and labs in AM, and provide IV hydration.  Patient made aware, patient tearful and unwilling to speak, patient's mother at bedside.  Will pass on to primary nurse for follow up.

## 2017-06-17 LAB — BASIC METABOLIC PANEL
ANION GAP: 8 (ref 5–15)
BUN: 10 mg/dL (ref 6–20)
CALCIUM: 7.8 mg/dL — AB (ref 8.9–10.3)
CO2: 20 mmol/L — AB (ref 22–32)
Chloride: 111 mmol/L (ref 101–111)
Creatinine, Ser: 1.93 mg/dL — ABNORMAL HIGH (ref 0.44–1.00)
GFR calc non Af Amer: 37 mL/min — ABNORMAL LOW (ref >60)
GFR, EST AFRICAN AMERICAN: 42 mL/min — AB (ref >60)
Glucose, Bld: 81 mg/dL (ref 65–99)
Potassium: 3.3 mmol/L — ABNORMAL LOW (ref 3.5–5.1)
Sodium: 139 mmol/L (ref 135–145)

## 2017-06-17 LAB — CBC
HCT: 23.4 % — ABNORMAL LOW (ref 36.0–46.0)
HEMATOCRIT: 28.5 % — AB (ref 36.0–46.0)
HEMOGLOBIN: 8.1 g/dL — AB (ref 12.0–15.0)
Hemoglobin: 9.7 g/dL — ABNORMAL LOW (ref 12.0–15.0)
MCH: 30.3 pg (ref 26.0–34.0)
MCH: 29.8 pg (ref 26.0–34.0)
MCHC: 34.6 g/dL (ref 30.0–36.0)
MCHC: 34 g/dL (ref 30.0–36.0)
MCV: 87.6 fL (ref 78.0–100.0)
MCV: 87.7 fL (ref 78.0–100.0)
PLATELETS: 516 10*3/uL — AB (ref 150–400)
Platelets: 430 10*3/uL — ABNORMAL HIGH (ref 150–400)
RBC: 2.67 MIL/uL — AB (ref 3.87–5.11)
RBC: 3.25 MIL/uL — ABNORMAL LOW (ref 3.87–5.11)
RDW: 13.3 % (ref 11.5–15.5)
RDW: 13.3 % (ref 11.5–15.5)
WBC: 13.9 10*3/uL — ABNORMAL HIGH (ref 4.0–10.5)
WBC: 14.9 10*3/uL — ABNORMAL HIGH (ref 4.0–10.5)

## 2017-06-17 MED ORDER — TRAMADOL HCL 50 MG PO TABS: 50.0000 mg | ORAL_TABLET | Freq: Four times a day (QID) | ORAL | 0 refills | Status: DC | PRN

## 2017-06-17 MED ORDER — POTASSIUM CHLORIDE CRYS ER 20 MEQ PO TBCR
20.0000 meq | EXTENDED_RELEASE_TABLET | Freq: Two times a day (BID) | ORAL | Status: DC
  Administered 2017-06-17: 20 meq via ORAL
  Filled 2017-06-17: qty 1

## 2017-06-17 NOTE — Discharge Summary (Signed)
[facility]/Trauma Discharge Summary   Patient ID: Frances [name] MRN: [id number] DOB/AGE: [birth year] 19 y.o.  Admit date: 06/10/2017 Discharge date: 06/17/2017  Admitting Diagnosis: Left flank abscess and cellulitis  Discharge Diagnosis Patient Active Problem List   Diagnosis Date Noted  . Status post debridement 06/11/2017  . Cellulitis and abscess of trunk 06/10/2017  . Gallstones 10/23/2016  . Healthcare maintenance 09/16/2016  . Recurrent UTI 09/16/2016    Consultants none  Imaging: No results found.  Procedures Dr. Abbey Chatters (06/11/17) - Incision, drainage, debridement of large flank abscess and necrotizing soft tissue infection Dr. Abbey Chatters (06/12/17) - Left flank wound debridement and dressing change  HPI: Pt is a 21 year old female with a history of seizures as a child, asthma, anemia on iron supplements who presented to the [facility] ED with complaints of mass and pain to left flank. Pt states about a week ago she noticed a "bug bite" on her left flank. It became progressively more painful and larger. She states constant pain, worse with touch, nonradiaiting, with associated hot spells and decreased appetitie. She states she has not ate or drank much in the last few days.  She tried hot bathes with epsom salt and alcohol without relief. She states after one bath she noticed bloody discharge. She denies injury to this area. No fevers or chills at home. No other associated symptoms. No abdominal pain, nausea, vomiting, CP or SOB.   Hospital Course:  Patient was admitted, started on IV antibiotics and underwent procedures listed above. Tolerated procedures well. Pt's Hcg was 7.1 on admission but was recheck and it was undetectable.  Diet was advanced as tolerated. Wound cultures revealed MRSA. Pt was placed on Vancomycin initially but was found to have nephrotoxicity from the vancomycin seen with elevated creatinine. The vancomycin was stopped and pt was placed  on PO Linezolid. Pt remained on IVF. Pt was not oliguric and was clearing potassium. Creatinine trended down during her stay. Pt grew E. Coli in her urine sensitive to Zosyn which she was already taking. Pt has a hx of iron deficiency anemia and her Hg remained stable during her stay. A negative pressure wound dressing was placed on the pts wound. On 06/17/17, the patient was voiding well, tolerating diet, ambulating well, pain well controlled, vital signs stable,wound vac intact, and felt stable for discharge home.  Patient will follow up in our office in 1 week and knows to call with questions or concerns.  She will call to confirm appointment date/time. Pt knows to have labs drawn 2 days prior to her follow up appointment to check her creatinine and Hg.  Patient was discharged in good condition.  The [place] Substance controlled database was reviewed prior to prescribing narcotic pain medication to this patient.  Physical Exam: Gen: Alert, NAD, pleasant, cooperative Card: RRR, no M/G/R heard Pulm: CTA, no W/R/R, rate andeffort normal Skin: warm and dry, wound to left flank with wound vac intact, less erythema and no induration noted     Allergies as of 06/17/2017      Reactions   Vancomycin    Nephrotoxicity      Medication List    TAKE these medications   docusate sodium 100 MG capsule Commonly known as:  COLACE Take 1 capsule (100 mg total) by mouth 2 (two) times daily as needed.   ferrous gluconate 324 MG tablet Commonly known as:  FERGON Take 1 tablet (324 mg total) by mouth 2 (two) times daily with a  meal.   ibuprofen 600 MG tablet Commonly known as:  ADVIL,MOTRIN Take 1 tablet (600 mg total) by mouth every 6 (six) hours.   PRENATAL COMPLETE 14-0.4 MG Tabs Take 1 tablet by mouth daily.   traMADol 50 MG tablet Commonly known as:  ULTRAM Take 1 tablet (50 mg total) every 6 (six) hours as needed by mouth for moderate pain.        Follow-up Information     [facility] Follow up.   Why:  nurse to assist with wound care Contact information: [address] [place] [address] [phone]        Avel Peaceosenbower, Todd, MD. Go on 06/25/2017.   Specialty:  General Surgery Why:  follow up appointment at 11:15AM, please arrive 30 minutes prior to complete paperwork. Please bring photo ID and insurance card  Contact information: [address] [place] [address] [phone]        [facility]. Go on 06/23/2017.   Why:  Please go here to have labs drawn anytime between 7am and 5pm. You do not need an appointment. It is important to have them drawn this day so we have the results for your appointment with Dr. Abbey Chattersosenbower,.  Contact information: [address], [address] [place]          Signed: Joyce CopaJessica L [facility] 06/17/2017, 3:20 PM Pager: [phone] Consults: [phone] Mon-Fri 7:00 am-4:30 pm Sat-Sun 7:00 am-11:30 am

## 2017-06-17 NOTE — Progress Notes (Signed)
[facility] Progress Note  5 Days Post-Op   Assessment/Plan Large left Flank abscess - S/P Incision, drainage, and debridement of large left flank abscess and necrotizing soft tissue infection, Dr. Abbey Chattersosenbower, 10/31 - S/P Left flank wound debridement (3 cm of subcutaneous tissue and muscle) and dressing change, Dr. Abbey Chattersosenbower, 11/1->wound continues to look better healing by secondary intention- BID wet to dry dressing changes - culture showed MRSA, Linezolid started; organism sensitive to Bactrim but this is renally cleared  Iron deficiency anemia - Hg down to 8.1 today from 8.5 on 11/05 - on iron suppliments  AKI due to Vancomycin nephrotoxicity - creatininetrending down, 1.93today downfrom 2.01yesterday (11/4); not oliguric and clearingpotassium - nurse removed IV yesterday so IVF were stopped - strict I&O's - daily BMP  UTI: - E. Coli on Keflex  FEN: reg diet VTE: Lovenox Follow up: TBD   Plan: Continue Linezolid, wound vac, Keflex. Will recheck Hg this afternoon and if stable will dc home.      LOS: 6 days    Subjective:  CC: left flank pain  Pt denies new complaints. No fever or chills. No nausea or vomiting  Objective: Vital signs in last 24 hours: Temp:  [98.5 F (36.9 C)-98.8 F (37.1 C)] 98.5 F (36.9 C) (11/06 0542) Pulse Rate:  [82-101] 82 (11/06 0542) Resp:  [18] 18 (11/06 0542) BP: (129-150)/(73-90) 150/90 (11/06 0542) SpO2:  [100 %] 100 % (11/06 0542) Last BM Date: 06/12/17  Intake/Output from previous day: 11/05 0701 - 11/06 0700 In: 5015 [P.O.:1560; I.V.:3455] Out: 3200 [Urine:3200] Intake/Output this shift: Total I/O In: -  Out: 800 [Urine:800]  PE: Gen:  Alert, NAD, pleasant, cooperative Card:  RRR, no M/G/R heard Pulm:  CTA, no W/R/R, rate and effort normal Skin: warm and dry, wound to left flank with wound vac intact, less erythema and no induration noted      Anti-infectives: Anti-infectives  (From admission, onward)   Start     Dose/Rate Route Frequency Ordered Stop   06/14/17 1000  cephALEXin (KEFLEX) capsule 500 mg  Status:  Discontinued     500 mg Oral Every 12 hours 06/13/17 1409 06/13/17 1409   06/13/17 2200  cephALEXin (KEFLEX) capsule 500 mg     500 mg Oral Every 12 hours 06/13/17 1409     06/13/17 1500  linezolid (ZYVOX) tablet 600 mg  Status:  Discontinued     600 mg Oral Every 12 hours 06/13/17 1416 06/13/17 1419   06/13/17 1500  linezolid (ZYVOX) tablet 600 mg     600 mg Oral Every 12 hours 06/13/17 1419 06/16/17 2246   06/13/17 1415  linezolid (ZYVOX) 100 MG/5ML suspension 600 mg  Status:  Discontinued     600 mg Oral Every 12 hours 06/13/17 1409 06/13/17 1415   06/13/17 1015  linezolid (ZYVOX) tablet 600 mg  Status:  Discontinued     600 mg Oral Every 12 hours 06/13/17 1004 06/13/17 1007   06/11/17 0928  piperacillin-tazobactam (ZOSYN) 3.375 GM/50ML IVPB    Comments:  Curlene DolphinWhitlow, Cheryl   : cabinet override      06/11/17 [id number] 06/11/17 1002   06/11/17 0400  vancomycin (VANCOCIN) IVPB 1000 mg/200 mL premix  Status:  Discontinued     1,000 mg 200 mL/hr over 60 Minutes Intravenous Every 8 hours 06/10/17 1545 06/10/17 1551   06/11/17 0100  vancomycin (VANCOCIN) IVPB 1000 mg/200 mL premix  Status:  Discontinued     1,000 mg 200 mL/hr over 60 Minutes Intravenous Every  8 hours 06/10/17 1551 06/12/17 0551   06/10/17 1600  vancomycin (VANCOCIN) 1,500 mg in sodium chloride 0.9 % 500 mL IVPB     1,500 mg 250 mL/hr over 120 Minutes Intravenous  Once 06/10/17 1545 06/10/17 1824   06/10/17 1600  piperacillin-tazobactam (ZOSYN) IVPB 3.375 g  Status:  Discontinued     3.375 g 12.5 mL/hr over 240 Minutes Intravenous Every 8 hours 06/10/17 1545 06/13/17 1409   06/10/17 1000  cefTRIAXone (ROCEPHIN) 1 g in dextrose 5 % 50 mL IVPB     1 g 100 mL/hr over 30 Minutes Intravenous  Once 06/10/17 0950 06/10/17 1104      Lab Results:  Recent Labs    06/16/17 0553 06/17/17 0548  WBC  13.8* 13.9*  HGB 8.5* 8.1*  HCT 26.0* 23.4*  PLT 419* 430*   BMET Recent Labs    06/16/17 0553 06/17/17 0548  NA 143 139  K 3.8 3.3*  CL 117* 111  CO2 20* 20*  GLUCOSE 102* 81  BUN 12 10  CREATININE 2.01* 1.93*  CALCIUM 8.1* 7.8*   PT/INR No results for input(s): LABPROT, INR in the last 72 hours. CMP     Component Value Date/Time   NA 139 06/17/2017 0548   K 3.3 (L) 06/17/2017 0548   CL 111 06/17/2017 0548   CO2 20 (L) 06/17/2017 0548   GLUCOSE 81 06/17/2017 0548   BUN 10 06/17/2017 0548   CREATININE 1.93 (H) 06/17/2017 0548   CALCIUM 7.8 (L) 06/17/2017 0548   PROT 5.8 (L) 06/11/2017 0526   ALBUMIN 2.7 (L) 06/11/2017 0526   AST 48 (H) 06/11/2017 0526   ALT 63 (H) 06/11/2017 0526   ALKPHOS 98 06/11/2017 0526   BILITOT 2.2 (H) 06/11/2017 0526   GFRNONAA 37 (L) 06/17/2017 0548   GFRAA 42 (L) 06/17/2017 0548   Lipase     Component Value Date/Time   LIPASE 26 10/15/2016 0750    Studies/Results: No results found.    Jerre SimonJessica L Focht , [facility] 06/17/2017, 10:32 AM Pager: [phone] Consults: [phone] Mon-Fri 7:00 am-4:30 pm Sat-Sun 7:00 am-11:30 am

## 2017-06-17 NOTE — Progress Notes (Signed)
Discharge instructions given to patient states she understands, home with portable wound vac. With [facility] to follow.

## 2017-06-20 ENCOUNTER — Telehealth: Payer: Self-pay | Admitting: *Deleted

## 2017-06-20 NOTE — Telephone Encounter (Signed)
[facility] to report that patient has been expericiencing some chest pain 6/10 for the past 2 days.  Called patient directly who reports the pain is not constant and relieved by taking her pain medication. Patient with no SOB, dizziness, and presently without pain. Patient instructed to go to ED if pain persists or if any sudden SOB and HFU scheduled for 11/13. FYI to MD

## 2017-06-23 ENCOUNTER — Ambulatory Visit (INDEPENDENT_AMBULATORY_CARE_PROVIDER_SITE_OTHER): Payer: Medicaid Other | Admitting: Internal Medicine

## 2017-06-23 DIAGNOSIS — R0789 Other chest pain: Secondary | ICD-10-CM | POA: Diagnosis present

## 2017-06-23 DIAGNOSIS — R079 Chest pain, unspecified: Secondary | ICD-10-CM | POA: Insufficient documentation

## 2017-06-23 NOTE — Telephone Encounter (Signed)
Patient called back - chest pain is central, non radiating. Feels like pressure. Sporadic cough. No fevers (hasn't checked temperature). No leg swelling or leg pain. Reports she had some bilateral leg swelling in the hospital, but this improved. Instructed patient to come into clinic this morning to be seen. Told her white team will call back with appt vs walk in instructions.

## 2017-06-23 NOTE — Progress Notes (Signed)
   Subjective:   Patient: Frances ParadiseCaaliegh N Troutman       Birthdate: 07/20/1996       MRN: [id number]      HPI  Frances [name] is a 21 y.o. female presenting for walk in appt for chest pain.   Chest pain Patient recently admitted to [facility] for cellulitis and surgical debridement of trunk abscess. She was discharged on 11/06. During admission, she was intubated for surgical debridement, and reports that chest pain started after intubation. She reported this chest pain while admitted, and said that EKGs were performed and were all normal. She says that the pain is located in the middle of her chest and does not radiate at all. Does not worsen with breathing. Cannot identify any worsening factors. Is relieved by taking pain medication (instructed to take ibuprofen and Tramadol upon discharge). Denies cough. Denies fevers, chills. Denies leg pain or swelling. Denies palpitations.   Smoking status reviewed. Patient is never smoker.   Review of Systems See HPI.     Objective:  Physical Exam  Constitutional: She is oriented to person, place, and time and well-developed, well-nourished, and in no distress.  HENT:  Head: Normocephalic and atraumatic.  Cardiovascular: Normal rate, regular rhythm and normal heart sounds.  No murmur heard. Pulmonary/Chest: Effort normal and breath sounds normal. No respiratory distress. She has no wheezes. She has no rales.  TTP of midsternal region.   Musculoskeletal:  No LE edema, TTP, palpable cords, erythema. Homan's sign neg bilaterally.   Neurological: She is alert and oriented to person, place, and time.  Skin: Skin is warm and dry.  Psychiatric: Judgment normal.  Flat affect       Assessment & Plan:  Chest pain Most likely 2/2 intubation, as pain started after intubation, improves with NSAIDs, and is reproducible on palpation. Less likely post-op PNA, as no coughing, fevers, or other systemic symptoms. Lungs also CTAB with no wheezing/rhonchi and  normal WOB on RA with O2 sat 99%. Less likely PE, as no LE tenderness or swelling. HR also WNL at 80. Encouraged patient to continue taking ibuprofen PRN with Tramadol for breakthrough pain. Patient has hospital f/u appt tomorrow with Dr. Talbert ForestShirley. Instructed patient to call office or go to ED if pain worsened, is not relieved by meds, or is accompanied by SOB.     Tarri AbernethyAbigail J Maylyn Narvaiz, MD, MPH PGY-3 [facility] Pager [phone]

## 2017-06-23 NOTE — Assessment & Plan Note (Signed)
Most likely 2/2 intubation, as pain started after intubation, improves with NSAIDs, and is reproducible on palpation. Less likely post-op PNA, as no coughing, fevers, or other systemic symptoms. Lungs also CTAB with no wheezing/rhonchi and normal WOB on RA with O2 sat 99%. Less likely PE, as no LE tenderness or swelling. HR also WNL at 80. Encouraged patient to continue taking ibuprofen PRN with Tramadol for breakthrough pain. Patient has hospital f/u appt tomorrow with Dr. Talbert ForestShirley. Instructed patient to call office or go to ED if pain worsened, is not relieved by meds, or is accompanied by SOB.

## 2017-06-23 NOTE — Patient Instructions (Signed)
It was nice meeting you today Cataleia!  Your chest pain is most likely due to your intubation due your procedure (the tube placed down your throat to help you breathe). This should improve with time. You can continue to take ibuprofen and Tramadol as prescribed to help with pain.   Please be sure to come back tomorrow for your follow up appointment with Dr. Chanetta Marshallimberlake.   If your chest pain worsens, does not improve with pain medicine, or is accompanied by trouble breathing, please call our office or go to the emergency room.  If you have any questions or concerns, please feel free to call the clinic.   Be well,  Dr. Natale MilchLancaster

## 2017-06-23 NOTE — Telephone Encounter (Signed)
Called patient on mobile listed, left generic VM to call clinic back. Given her recent hospitalization, I am more concerned with her chest pain than the typical 21yo patient. Differential includes post op pneumonia, PE, etc. Please have her be seen in some fashion today (walk in/same day vs ED) if she is still having chest pain.   Loni MuseKate Timberlake, MD

## 2017-06-23 NOTE — Telephone Encounter (Signed)
Per Dr. Chanetta Marshallimberlake I called pt and told pt to come into the clinic before 10 am to be seen. Pt said that in home nurse is coming at 9 and she will come to the office.  I told her that if she can't make it this morning that she can come at 130 and be seen. But that Dr. Chanetta Marshallimberlake would like her to be seen this am.  Pt understands and will get here today. Frances SpillersSharon T Myan Suit, CMA

## 2017-06-24 ENCOUNTER — Ambulatory Visit (INDEPENDENT_AMBULATORY_CARE_PROVIDER_SITE_OTHER): Payer: Medicaid Other | Admitting: Family Medicine

## 2017-06-24 ENCOUNTER — Encounter: Payer: Self-pay | Admitting: Family Medicine

## 2017-06-24 VITALS — BP 130/88 | HR 81 | Temp 97.8°F | Wt 162.0 lb

## 2017-06-24 DIAGNOSIS — R0789 Other chest pain: Secondary | ICD-10-CM | POA: Diagnosis not present

## 2017-06-24 DIAGNOSIS — Z9889 Other specified postprocedural states: Secondary | ICD-10-CM | POA: Diagnosis present

## 2017-06-24 DIAGNOSIS — Z23 Encounter for immunization: Secondary | ICD-10-CM

## 2017-06-24 DIAGNOSIS — N926 Irregular menstruation, unspecified: Secondary | ICD-10-CM | POA: Diagnosis not present

## 2017-06-24 LAB — POCT URINE PREGNANCY: PREG TEST UR: NEGATIVE

## 2017-06-24 NOTE — Assessment & Plan Note (Addendum)
Patient reporting LMP was 05/13/2017. She had regular vaginal delivery with no complications 3 months ago. Patient lost 400 cc of blood during delivery. Patient unable to produce milk initially and has since produced some colostrum.  Patient has had 2 regular periods after delivery.  Doubt Sheehan syndrome.    Negative urine pregnancy test today.  Patient does not want to start any contraception.  However she was given a handout and counseling on different birth control methods.  Encouraged to consider a birth control method if she does not want to become pregnant again, which she states that she would not like to become pregnant.  Refused Depo shot today and all other forms of birth control offered including IUD, Nexplanon, and hormonal pills.

## 2017-06-24 NOTE — Patient Instructions (Signed)
Thank you for coming to see me today. It was a pleasure! Today we talked about:   Your irregular periods and you recent admission tot he hospital. You have a follow up with surgery tomorrow.   Please consider some birth control option if you are not interested in becoming pregnant again. We have many resources available to you.   If you have any questions or concerns, please do not hesitate to call the office at [phone].  Take Care,   [place] Empress Newmann, DO  Contraception Choices Contraception, also called birth control, means things to use or ways to try not to get pregnant. Hormonal birth control This kind of birth control uses hormones. Here are some types of hormonal birth control:  A tube that is put under skin of the arm (implant). The tube can stay in for as long as 3 years.  Shots to get every 3 months (injections).  Pills to take every day (birth control pills).  A patch to change 1 time each week for 3 weeks (birth control patch). After that, the patch is taken off for 1 week.  A ring to put in the vagina. The ring is left in for 3 weeks. Then it is taken out of the vagina for 1 week. Then a new ring is put in.  Pills to take after unprotected sex (emergency birth control pills).  Barrier birth control Here are some types of barrier birth control:  A thin covering that is put on the penis before sex (female condom). The covering is thrown away after sex.  A soft, loose covering that is put in the vagina before sex (female condom). The covering is thrown away after sex.  A rubber bowl that sits over the cervix (diaphragm). The bowl must be made for you. The bowl is put into the vagina before sex. The bowl is left in for 6-8 hours after sex. It is taken out within 24 hours.  A small, soft cup that fits over the cervix (cervical cap). The cup must be made for you. The cup can be left in for 6-8 hours after sex. It is taken out within 48 hours.  A sponge that is put  into the vagina before sex. It must be left in for at least 6 hours after sex. It must be taken out within 30 hours. Then it is thrown away.  A chemical that kills or stops sperm from getting into the uterus (spermicide). It may be a pill, cream, jelly, or foam to put in the vagina. The chemical should be used at least 10-15 minutes before sex.  IUD (intrauterine) birth control An IUD is a small, T-shaped piece of plastic. It is put inside the uterus. There are two kinds:  Hormone IUD. This kind can stay in for 3-5 years.  Copper IUD. This kind can stay in for 10 years.  Permanent birth control Here are some types of permanent birth control:  Surgery to block the fallopian tubes.  Having an insert put into each fallopian tube.  Surgery to tie off the tubes that carry sperm (vasectomy).  Natural planning birth control Here are some types of natural planning birth control:  Not having sex on the days the woman could get pregnant.  Using a calendar: ? To keep track of the length of each period. ? To find out what days pregnancy can happen. ? To plan to not have sex on days when pregnancy can happen.  Watching for symptoms of ovulation  and not having sex during ovulation. One way the woman can check for ovulation is to check her temperature.  Waiting to have sex until after ovulation.  Summary  Contraception, also called birth control, means things to use or ways to try not to get pregnant.  Hormonal methods of birth control include implants, injections, pills, patches, vaginal rings, and emergency birth control pills.  Barrier methods of birth control can include female condoms, female condoms, diaphragms, cervical caps, sponges, and spermicides.  There are two types of IUD (intrauterine device) birth control. An IUD can be put in a woman's uterus to prevent pregnancy for 3-5 years.  Permanent sterilization can be done through a procedure for males, females, or both.  Natural  planning methods involve not having sex on the days when the woman could get pregnant. This information is not intended to replace advice given to you by your health care provider. Make sure you discuss any questions you have with your health care provider. Document Released: 05/26/2009 Document Revised: 08/08/2016 Document Reviewed: 08/08/2016 Elsevier Interactive Patient Education  2017 [employer]

## 2017-06-24 NOTE — Progress Notes (Signed)
Subjective:    Patient ID: Frances [name], female    DOB: 12/05/1995, 21 y.o.   MRN: [id number]   CC: Hospital F/u  HPI:  Cellulitis and Abscess: Patient recently discharged from hospital for trunk abscess that was surgically debrided.  She has an appointment with surgery tomorrow for follow-up.  Wound VAC still in place.  Patient denies any fevers.  Patient also developed a UTI while she was in the hospital and is not having any signs of further UTI.  Denies dysuria or increased frequency of urination.  Chest pain Patient reported to clinic yesterday for same day follow-up after having chest pain.  Patient reports today that it is much improved with ibuprofen.  Likely related to her recent intubation for surgical debridement.  Patient is denying any shortness of breath, chest tightness, chest pain or troubl wheezing.  Missed period Patient's mom reminded patient that she has not had a period since 05/13/2017.  Mom is worried that she may be pregnant again.  Patient and mom would like to have a pregnancy test to determine whether or not she is currently pregnant.  Patient reports that she had a urine pregnancy test while admitted to the hospital which was negative.  This was on 11/02.  Patient reports that she knows about different birth control options and she is not interested in starting any however she reports that she does not want to become pregnant.  She had a regular vaginal delivery with no complication 3 months ago.  Was unable to produce enough milk in order to breast-feed and is solely bottlefeeding now.  -Patient denies any heavy menstrual bleeding, cramping, nausea, breast pain.   Smoking status reviewed  Review of Systems  Per HPI, else denies recent illness, fever, headache, changes in vision, chest pain, shortness of breath, abdominal pain, N/V/D, weakness   Patient Active Problem List   Diagnosis Date Noted  . Irregular menstrual cycle 06/24/2017  . Chest pain  06/23/2017  . Status post debridement 06/11/2017  . Cellulitis and abscess of trunk 06/10/2017  . Gallstones 10/23/2016  . Healthcare maintenance 09/16/2016  . Recurrent UTI 09/16/2016     Objective:  BP 130/88   Pulse 81   Temp 97.8 F (36.6 C) (Oral)   Wt 162 lb (73.5 kg)   LMP 05/15/2017   SpO2 99%   BMI 25.37 kg/m  Vitals and nursing note reviewed  General: NAD, pleasant Cardiac: RRR, normal heart sounds, no murmurs Respiratory: CTAB, normal effort Abdomen: soft, nontender, nondistended Trunk: wound vac in place over recently debrided abscess with no surrounding erythema Extremities: no edema or cyanosis. WWP. Skin: warm and dry, no rashes noted Neuro: alert and oriented, no focal deficits   Assessment & Plan:    Status post debridement Patient with no associated fevers or pain after debridement. Wound vac in place. Has follow up with surgery tomorrow.   Chest pain Likely 2/2 to intubation, patient much improved since her visit yesterday with only ibuprofen. Given return precautions of worsening chest pain not relieved by ibuprofen, nausea or accompanied SOB. Less likely PE given that she having no SOB and is CTABL.  Irregular menstrual cycle Patient reporting LMP was 05/13/2017. She had regular vaginal delivery with no complications 3 months ago. Patient lost 400 cc of blood during delivery. Patient unable to produce milk initially and has since produced some colostrum.  Patient has had 2 regular periods after delivery.  Doubt Sheehan syndrome.    Negative urine pregnancy test  today.  Patient does not want to start any contraception.  However she was given a handout and counseling on different birth control methods.  Encouraged to consider a birth control method if she does not want to become pregnant again, which she states that she would not like to become pregnant.  Refused Depo shot today and all other forms of birth control offered including IUD, Nexplanon, and  hormonal pills.    [place] Jacari Kirsten, DO Family Medicine Resident PGY-1

## 2017-06-24 NOTE — Assessment & Plan Note (Addendum)
Likely 2/2 to intubation, patient much improved since her visit yesterday with only ibuprofen. Given return precautions of worsening chest pain not relieved by ibuprofen, nausea or accompanied SOB. Less likely PE given that she having no SOB and is CTABL.

## 2017-06-24 NOTE — Assessment & Plan Note (Addendum)
Patient with no associated fevers or pain after debridement. Wound vac in place. Has follow up with surgery tomorrow.

## 2017-07-02 ENCOUNTER — Ambulatory Visit: Payer: Medicaid Other

## 2017-07-10 ENCOUNTER — Telehealth: Payer: Self-pay | Admitting: Family Medicine

## 2017-07-10 NOTE — Telephone Encounter (Signed)
Called advanced home care. I have not met this patient, and the home health orders reflected wound care from recent surgery. I discussed with [facility] team and will have them re route home health orders to surgical team (Dr. Jackolyn Confer at [place]).   Ralene Ok, MD

## 2017-08-14 ENCOUNTER — Ambulatory Visit: Payer: Medicaid Other | Admitting: Student

## 2017-09-30 ENCOUNTER — Inpatient Hospital Stay (HOSPITAL_COMMUNITY)
Admission: AD | Admit: 2017-09-30 | Discharge: 2017-09-30 | Disposition: A | Discharge status: Home / Self Care | Payer: Medicaid Other | Source: Ambulatory Visit | Attending: Obstetrics & Gynecology | Admitting: Obstetrics & Gynecology

## 2017-09-30 ENCOUNTER — Encounter (HOSPITAL_COMMUNITY): Payer: Self-pay | Admitting: *Deleted

## 2017-09-30 DIAGNOSIS — G8929 Other chronic pain: Secondary | ICD-10-CM | POA: Diagnosis not present

## 2017-09-30 DIAGNOSIS — M545 Low back pain: Secondary | ICD-10-CM | POA: Insufficient documentation

## 2017-09-30 DIAGNOSIS — M549 Dorsalgia, unspecified: Secondary | ICD-10-CM | POA: Diagnosis present

## 2017-09-30 DIAGNOSIS — D649 Anemia, unspecified: Secondary | ICD-10-CM | POA: Diagnosis not present

## 2017-09-30 DIAGNOSIS — R42 Dizziness and giddiness: Secondary | ICD-10-CM | POA: Diagnosis not present

## 2017-09-30 DIAGNOSIS — Z8249 Family history of ischemic heart disease and other diseases of the circulatory system: Secondary | ICD-10-CM | POA: Diagnosis not present

## 2017-09-30 DIAGNOSIS — Z9889 Other specified postprocedural states: Secondary | ICD-10-CM

## 2017-09-30 DIAGNOSIS — Z8279 Family history of other congenital malformations, deformations and chromosomal abnormalities: Secondary | ICD-10-CM | POA: Diagnosis not present

## 2017-09-30 DIAGNOSIS — Z79899 Other long term (current) drug therapy: Secondary | ICD-10-CM | POA: Diagnosis not present

## 2017-09-30 DIAGNOSIS — I951 Orthostatic hypotension: Secondary | ICD-10-CM | POA: Diagnosis not present

## 2017-09-30 DIAGNOSIS — Z881 Allergy status to other antibiotic agents status: Secondary | ICD-10-CM | POA: Insufficient documentation

## 2017-09-30 DIAGNOSIS — Z825 Family history of asthma and other chronic lower respiratory diseases: Secondary | ICD-10-CM | POA: Insufficient documentation

## 2017-09-30 LAB — URINALYSIS, ROUTINE W REFLEX MICROSCOPIC
Bilirubin Urine: NEGATIVE
Glucose, UA: NEGATIVE mg/dL
Hgb urine dipstick: NEGATIVE
Ketones, ur: NEGATIVE mg/dL
Leukocytes, UA: NEGATIVE
Nitrite: NEGATIVE
Protein, ur: NEGATIVE mg/dL
Specific Gravity, Urine: 1.017 (ref 1.005–1.030)
pH: 6 (ref 5.0–8.0)

## 2017-09-30 LAB — CBC
HCT: 36.9 % (ref 36.0–46.0)
Hemoglobin: 12.4 g/dL (ref 12.0–15.0)
MCH: 29.6 pg (ref 26.0–34.0)
MCHC: 33.6 g/dL (ref 30.0–36.0)
MCV: 88.1 fL (ref 78.0–100.0)
Platelets: 319 10*3/uL (ref 150–400)
RBC: 4.19 MIL/uL (ref 3.87–5.11)
RDW: 12.7 % (ref 11.5–15.5)
WBC: 4.5 10*3/uL (ref 4.0–10.5)

## 2017-09-30 LAB — POCT PREGNANCY, URINE: PREG TEST UR: NEGATIVE

## 2017-09-30 MED ORDER — CYCLOBENZAPRINE HCL 10 MG PO TABS
10.0000 mg | ORAL_TABLET | Freq: Once | ORAL | Status: AC
  Administered 2017-09-30: 10 mg via ORAL
  Filled 2017-09-30: qty 1

## 2017-09-30 NOTE — Discharge Instructions (Signed)

## 2017-09-30 NOTE — MAU Provider Note (Signed)
Chief Complaint  Patient presents with  . Back Pain  . Dizziness   HPI  Frances [name] is a 22 y.o. female with a PMHx significant for back surgery in 06/2017 at [facility] for an abscess who presents today for 2.5 months of back pain and one episode of near syncope. Patient describes the pain as being 8/10 achy sharp pain at its worst that is located across the low back. No symptoms of cauda equina. No other recent trauma to the area. Does not lift heavy objects at work due to surgery on back. She states that she uses muscle relaxers sometimes to help with the pain. The pain is worse with standing.   Patient describes one episode of near syncope. She was at work and stood up quickly and began to walk when her vision became blurry and dark around the edges. She states there was some shortness of breath and fast heart beating which resolved within a few seconds. She states that she had a slight headache following this. Denies any changes in sensation, mentation or consciousness. This has not happened again. She states that she drinks 3-4 bottles of water daily, but that she is anemic.   Pertinent negative ROS: fevers, chills, night sweats, cough, wheeze, chest pain, abdominal pain, nausea, vomiting, diarrhea, constipation, urinary symptoms, vaginal bleeding.    Past Medical History:  Diagnosis Date  . Anemia    on Iron supplements  . Asthma 1999   as a child; no current problems  . Chronic kidney disease    as a child  . Constipation   . Gall stones   . Seizures (HCC)    seizure disorder as a child, treated with medication; none since  . UTI (lower urinary tract infection)     Past Surgical History:  Procedure Laterality Date  . IRRIGATION AND DEBRIDEMENT ABSCESS Left 06/11/2017   Procedure: IRRIGATION, DRAINAGE AND DEBRIDEMENT LEFT FLANK NECROTIZING SOFT TISSUE INFECTION;  Surgeon: Avel Peace, MD;  Location: [facility];  Service: General;  Laterality: Left;  . TYMPANOSTOMY  TUBE PLACEMENT    . WOUND DEBRIDEMENT Left 06/12/2017   Procedure: DRESSING CHANGE AND DEBRIDEMENT OF WOUND;  Surgeon: Avel Peace, MD;  Location: [facility];  Service: General;  Laterality: Left;    Family History  Problem Relation Age of Onset  . Asthma Mother   . Hypertension Mother   . Asthma Sister   . Birth defects Sister     Social History   Tobacco Use  . Smoking status: Never Smoker  . Smokeless tobacco: Never Used  Substance Use Topics  . Alcohol use: No    Comment: occ  . Drug use: Yes    Frequency: 2.0 times per week    Types: Marijuana    Comment: quit 06/09/2017    Allergies:  Allergies  Allergen Reactions  . Vancomycin     Nephrotoxicity    Medications Prior to Admission  Medication Sig Dispense Refill Last Dose  . ferrous gluconate (FERGON) 324 MG tablet Take 1 tablet (324 mg total) by mouth 2 (two) times daily with a meal. (Patient taking differently: Take 324 mg by mouth daily. ) 60 tablet 3 09/29/2017 at Unknown time  . methocarbamol (ROBAXIN) 500 MG tablet Take 500 mg by mouth 2 (two) times daily as needed for muscle spasms.  0 09/30/2017 at 0600  . Multiple Vitamins-Calcium (ONE-A-DAY WOMENS FORMULA PO) Take 2 tablets by mouth daily. gummies   09/30/2017 at Unknown time  . ibuprofen (ADVIL,MOTRIN) 600  MG tablet Take 1 tablet (600 mg total) by mouth every 6 (six) hours. (Patient not taking: Reported on 06/10/2017) 30 tablet 0 Completed Course at Unknown time  . Prenatal Vit-Fe Fumarate-FA (PRENATAL COMPLETE) 14-0.4 MG TABS Take 1 tablet by mouth daily. (Patient not taking: Reported on 06/10/2017) 60 each 0 Not Taking at Unknown time  . traMADol (ULTRAM) 50 MG tablet Take 1 tablet (50 mg total) every 6 (six) hours as needed by mouth for moderate pain. 20 tablet 0     ROS Physical Exam Blood pressure 102/61, pulse 84, temperature (!) 97.4 F (36.3 C), resp. rate 18, height 5\' 5"  (1.651 m), weight 68.9 kg (152 lb), last menstrual period 09/19/2017, not  currently breastfeeding.   Orthostatic:  Lying 126/74  Sitting 109/62, 72  Standing  102/61, 84  Physical Exam  Constitutional: She is oriented to person, place, and time. She appears well-developed and well-nourished. No distress.  HENT:  Head: Normocephalic and atraumatic.  Eyes: Conjunctivae and EOM are normal. Pupils are equal, round, and reactive to light. No scleral icterus.  Neck: Normal range of motion.  Cardiovascular: Normal rate, regular rhythm and normal heart sounds. Exam reveals no gallop and no friction rub.  No murmur heard. Respiratory: Effort normal and breath sounds normal. No stridor. No respiratory distress. She has no wheezes. She has no rales.  GI: Soft. She exhibits no distension. There is no tenderness. There is no guarding.  Musculoskeletal: She exhibits no edema or deformity.  Neurological: She is alert and oriented to person, place, and time. She has normal reflexes.  Skin: Skin is warm and dry. No rash noted. No erythema. No pallor.  Psychiatric: She has a normal mood and affect. Her behavior is normal. Judgment and thought content normal.    MAU Course Procedures  MDM Orthostatics show 10mmHg drop in diastolic between lying and sitting > positive orthostatics  CBC WNL  Back pain muscular in origin with reproduction to palpation > flexeril and call ride    Assessment/Plan:  1. Back Pain -Flexeril 10mg  given today -follow up with surgeon for refills -gentle stretching of the back, warm compresses  2. Orthostatic hypotension -increase PO intake (recommend 64oz H2O daily) -RTC if symptoms return or worsen

## 2017-09-30 NOTE — MAU Note (Signed)
Pt presents to MAU with complaints of lower back pain and dizziness for a couple of weeks. Pt states she had an abscess on the left lower side of back that was drained in October.

## 2017-09-30 NOTE — MAU Provider Note (Signed)
Chief Complaint: Back Pain and Dizziness   First Provider Initiated Contact with Patient 09/30/17 0958      SUBJECTIVE HPI: Frances [name] is a 22 y.o. G1P1001 not currently pregnant who presents to maternity admissions reporting back pain and dizziness. She reports that she has been having back pain for 2.5 months since she [facility]. She was taking muscle relaxer for pain but ran out of medication last week. She describes the pain at achy and sharp across her lower back. She rates pain 8/10. She reports that the pain is worse with standing and movement. She reports dizziness that caused one episode of near syncope this past Saturday. She reports being at work and stood up quickly when her vision became blurry and dark around the edges. She reports that this occurrence only occurred once. She has a hx of being anemic, on iron supplements. She reports drinking 3-4 bottles of water daily. She denies vaginal bleeding, vaginal itching/burning, urinary symptoms, h/a, dizziness, n/v, or fever/chills.    Past Medical History:  Diagnosis Date  . Anemia    on Iron supplements  . Asthma 1999   as a child; no current problems  . Chronic kidney disease    as a child  . Constipation   . Gall stones   . Seizures (HCC)    seizure disorder as a child, treated with medication; none since  . UTI (lower urinary tract infection)    Past Surgical History:  Procedure Laterality Date  . IRRIGATION AND DEBRIDEMENT ABSCESS Left 06/11/2017   Procedure: IRRIGATION, DRAINAGE AND DEBRIDEMENT LEFT FLANK NECROTIZING SOFT TISSUE INFECTION;  Surgeon: Avel Peaceosenbower, Todd, MD;  Location: [facility];  Service: General;  Laterality: Left;  . TYMPANOSTOMY TUBE PLACEMENT    . WOUND DEBRIDEMENT Left 06/12/2017   Procedure: DRESSING CHANGE AND DEBRIDEMENT OF WOUND;  Surgeon: Avel Peaceosenbower, Todd, MD;  Location: [facility];  Service: General;  Laterality: Left;   Social History   Socioeconomic History  . Marital  status: Single    Spouse name: Not on file  . Number of children: Not on file  . Years of education: Not on file  . Highest education level: Not on file  Social Needs  . Financial resource strain: Not on file  . Food insecurity - worry: Not on file  . Food insecurity - inability: Not on file  . Transportation needs - medical: Not on file  . Transportation needs - non-medical: Not on file  Occupational History  . Not on file  Tobacco Use  . Smoking status: Never Smoker  . Smokeless tobacco: Never Used  Substance and Sexual Activity  . Alcohol use: No    Comment: occ  . Drug use: Yes    Frequency: 2.0 times per week    Types: Marijuana    Comment: quit 06/09/2017  . Sexual activity: Yes    Birth control/protection: None    Comment: Patient undecided  Other Topics Concern  . Not on file  Social History Narrative  . Not on file   No current facility-administered medications on file prior to encounter.    Current Outpatient Medications on File Prior to Encounter  Medication Sig Dispense Refill  . ferrous gluconate (FERGON) 324 MG tablet Take 1 tablet (324 mg total) by mouth 2 (two) times daily with a meal. (Patient taking differently: Take 324 mg by mouth daily. ) 60 tablet 3  . methocarbamol (ROBAXIN) 500 MG tablet Take 500 mg by mouth 2 (  two) times daily as needed for muscle spasms.  0  . Multiple Vitamins-Calcium (ONE-A-DAY WOMENS FORMULA PO) Take 2 tablets by mouth daily. gummies    . ibuprofen (ADVIL,MOTRIN) 600 MG tablet Take 1 tablet (600 mg total) by mouth every 6 (six) hours. (Patient not taking: Reported on 06/10/2017) 30 tablet 0  . Prenatal Vit-Fe Fumarate-FA (PRENATAL COMPLETE) 14-0.4 MG TABS Take 1 tablet by mouth daily. (Patient not taking: Reported on 06/10/2017) 60 each 0  . traMADol (ULTRAM) 50 MG tablet Take 1 tablet (50 mg total) every 6 (six) hours as needed by mouth for moderate pain. 20 tablet 0   Allergies  Allergen Reactions  . Vancomycin      Nephrotoxicity    ROS:  Review of Systems  Constitutional: Negative.   Respiratory: Negative.   Cardiovascular: Negative.   Gastrointestinal: Negative.   Genitourinary: Negative.   Musculoskeletal: Positive for back pain. Negative for neck pain.  Neurological: Positive for dizziness. Negative for weakness, light-headedness and headaches.       None currently  Psychiatric/Behavioral: Negative.    I have reviewed patient's Past Medical Hx, Surgical Hx, Family Hx, Social Hx, medications and allergies.   Physical Exam   Patient Vitals for the past 24 hrs:  BP Temp Pulse Resp Height Weight  09/30/17 0919 102/61 - 84 - - -  09/30/17 0917 126/74 - - - - -  09/30/17 0916 109/62 - 72 - - -  09/30/17 0912 116/62 (!) 97.4 F (36.3 C) 80 18 5\' 5"  (1.651 m) 152 lb (68.9 kg)   Constitutional: Well-developed, well-nourished female in no acute distress.  Cardiovascular: normal rate Respiratory: normal effort GI: Abd soft, non-tender. Pos BS x 4 MS: Extremities nontender, no edema, normal ROM Neurologic: Alert and oriented x 4.  GU: Neg CVAT. PELVIC EXAM: deferred  Skin: 10cm+ scar on back from surgery   LAB RESULTS Results for orders placed or performed during the hospital encounter of 09/30/17 (from the past 24 hour(s))  Urinalysis, Routine w reflex microscopic     Status: None   Collection Time: 09/30/17  8:46 AM  Result Value Ref Range   Color, Urine YELLOW YELLOW   APPearance CLEAR CLEAR   Specific Gravity, Urine 1.017 1.005 - 1.030   pH 6.0 5.0 - 8.0   Glucose, UA NEGATIVE NEGATIVE mg/dL   Hgb urine dipstick NEGATIVE NEGATIVE   Bilirubin Urine NEGATIVE NEGATIVE   Ketones, ur NEGATIVE NEGATIVE mg/dL   Protein, ur NEGATIVE NEGATIVE mg/dL   Nitrite NEGATIVE NEGATIVE   Leukocytes, UA NEGATIVE NEGATIVE  Pregnancy, urine POC     Status: None   Collection Time: 09/30/17  9:14 AM  Result Value Ref Range   Preg Test, Ur NEGATIVE NEGATIVE  CBC     Status: None   Collection Time:  09/30/17 10:03 AM  Result Value Ref Range   WBC 4.5 4.0 - 10.5 K/uL   RBC 4.19 3.87 - 5.11 MIL/uL   Hemoglobin 12.4 12.0 - 15.0 g/dL   HCT 16.1 09.6 - 04.5 %   MCV 88.1 78.0 - 100.0 fL   MCH 29.6 26.0 - 34.0 pg   MCHC 33.6 30.0 - 36.0 g/dL   RDW 40.9 81.1 - 91.4 %   Platelets 319 150 - 400 K/uL    --/--/A POS (10/31 1342)  MAU Management/MDM: Orders Placed This Encounter  Procedures  . Urinalysis, Routine w reflex microscopic  . CBC  . Orthostatic vital signs  . Pregnancy, urine POC  UPT- neg  CBC-normal, HGB 12.4 Orthostatic VS- show drop in diastolic between lying and sitting, positive   Meds ordered this encounter  Medications  . cyclobenzaprine (FLEXERIL) tablet 10 mg   Treatments in MAU included 10mg  Flexeril tablet. Pt reports decreased pain with medication to 2/10.   Discussed changing position slow, continuing iron supplements as prescribed, increasing amount of water consumed throughout the day and water consumption.   Pt discharged. Pt stable at time of discharge.   ASSESSMENT 1. Chronic bilateral low back pain without sciatica   2. History of back surgery   3. Episode of dizziness     PLAN Discharge home Follow up as scheduled with surgeon  Discussed back exercises to help with back pain  Return to urgent care or [facility] as needed for worsening symptoms   Allergies as of 09/30/2017      Reactions   Vancomycin    Nephrotoxicity      Medication List    STOP taking these medications   PRENATAL COMPLETE 14-0.4 MG Tabs     TAKE these medications   ferrous gluconate 324 MG tablet Commonly known as:  FERGON Take 1 tablet (324 mg total) by mouth 2 (two) times daily with a meal. What changed:  when to take this   ibuprofen 600 MG tablet Commonly known as:  ADVIL,MOTRIN Take 1 tablet (600 mg total) by mouth every 6 (six) hours.   methocarbamol 500 MG tablet Commonly known as:  ROBAXIN Take 500 mg by mouth 2 (two) times daily as needed for  muscle spasms.   ONE-A-DAY WOMENS FORMULA PO Take 2 tablets by mouth daily. gummies   traMADol 50 MG tablet Commonly known as:  ULTRAM Take 1 tablet (50 mg total) every 6 (six) hours as needed by mouth for moderate pain.      Steward Drone  Certified Nurse-Midwife 09/30/2017  4:47 PM

## 2017-10-02 ENCOUNTER — Ambulatory Visit: Payer: Medicaid Other | Admitting: Family Medicine

## 2017-12-16 ENCOUNTER — Encounter: Payer: Self-pay | Admitting: *Deleted

## 2018-01-27 ENCOUNTER — Emergency Department (HOSPITAL_COMMUNITY): Payer: Medicaid Other

## 2018-01-27 ENCOUNTER — Encounter (HOSPITAL_COMMUNITY): Payer: Self-pay | Admitting: Emergency Medicine

## 2018-01-27 ENCOUNTER — Other Ambulatory Visit: Payer: Self-pay

## 2018-01-27 DIAGNOSIS — R110 Nausea: Secondary | ICD-10-CM | POA: Insufficient documentation

## 2018-01-27 DIAGNOSIS — R0789 Other chest pain: Secondary | ICD-10-CM | POA: Insufficient documentation

## 2018-01-27 DIAGNOSIS — F1210 Cannabis abuse, uncomplicated: Secondary | ICD-10-CM | POA: Diagnosis not present

## 2018-01-27 DIAGNOSIS — N189 Chronic kidney disease, unspecified: Secondary | ICD-10-CM | POA: Insufficient documentation

## 2018-01-27 DIAGNOSIS — R079 Chest pain, unspecified: Secondary | ICD-10-CM | POA: Diagnosis present

## 2018-01-27 DIAGNOSIS — J45909 Unspecified asthma, uncomplicated: Secondary | ICD-10-CM | POA: Diagnosis not present

## 2018-01-27 LAB — BASIC METABOLIC PANEL
Anion gap: 7 (ref 5–15)
BUN: 13 mg/dL (ref 6–20)
CALCIUM: 8.9 mg/dL (ref 8.9–10.3)
CO2: 24 mmol/L (ref 22–32)
Chloride: 110 mmol/L (ref 101–111)
Creatinine, Ser: 0.59 mg/dL (ref 0.44–1.00)
Glucose, Bld: 105 mg/dL — ABNORMAL HIGH (ref 65–99)
Potassium: 3.4 mmol/L — ABNORMAL LOW (ref 3.5–5.1)
Sodium: 141 mmol/L (ref 135–145)

## 2018-01-27 LAB — I-STAT BETA HCG BLOOD, ED (MC, WL, AP ONLY): I-stat hCG, quantitative: <5 m[IU]/mL (ref <5)

## 2018-01-27 LAB — CBC
HCT: 32.2 % — ABNORMAL LOW (ref 36.0–46.0)
HEMOGLOBIN: 10.9 g/dL — AB (ref 12.0–15.0)
MCH: 30.7 pg (ref 26.0–34.0)
MCHC: 33.9 g/dL (ref 30.0–36.0)
MCV: 90.7 fL (ref 78.0–100.0)
PLATELETS: 310 10*3/uL (ref 150–400)
RBC: 3.55 MIL/uL — AB (ref 3.87–5.11)
RDW: 12.4 % (ref 11.5–15.5)
WBC: 3.5 10*3/uL — ABNORMAL LOW (ref 4.0–10.5)

## 2018-01-27 LAB — I-STAT TROPONIN, ED: TROPONIN I, POC: 0 ng/mL (ref 0.00–0.08)

## 2018-01-27 IMAGING — CR DG CHEST 2V
2 series · 2 of 2 positions shown · non-contrast
Comparison: [DATE] chest radiographs.

CLINICAL DATA: 21-year-old female with mid chest pain and nausea
for 24 hours.

EXAM:
CHEST - 2 VIEW

[w chest pa]
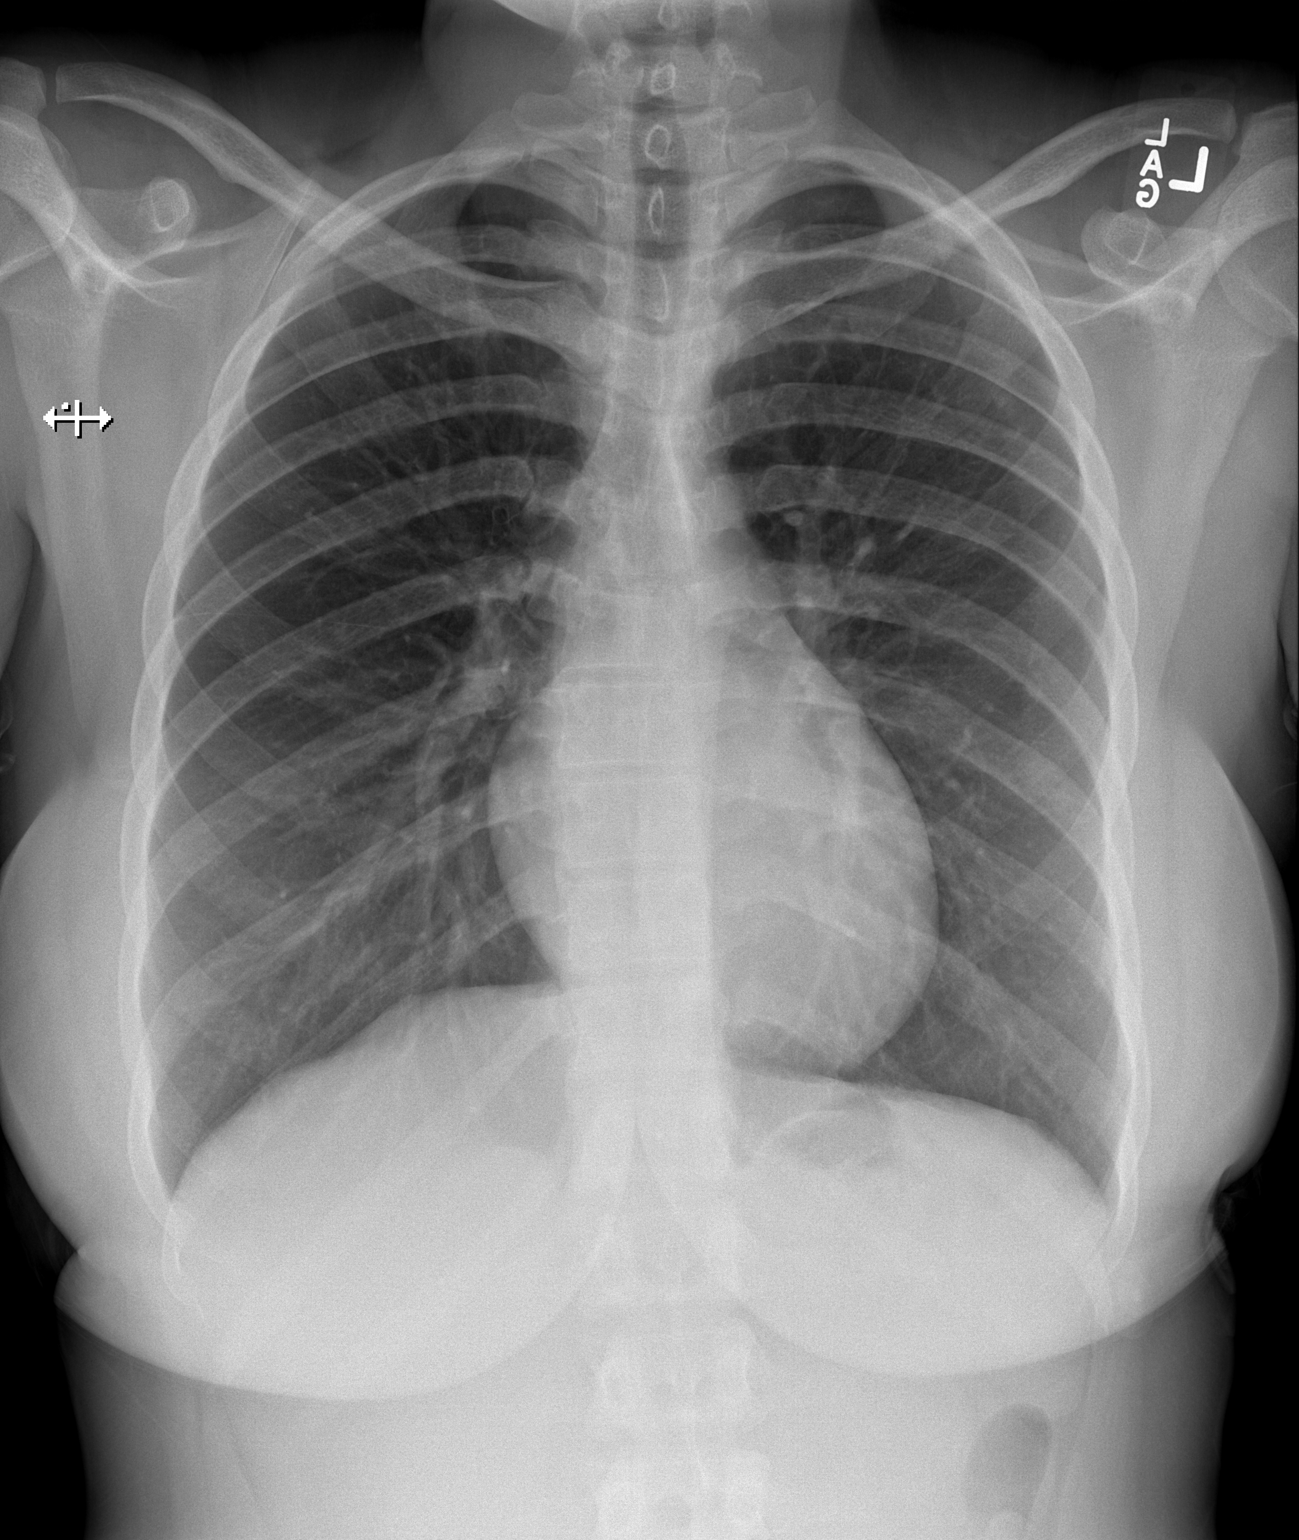

[w chest lat]
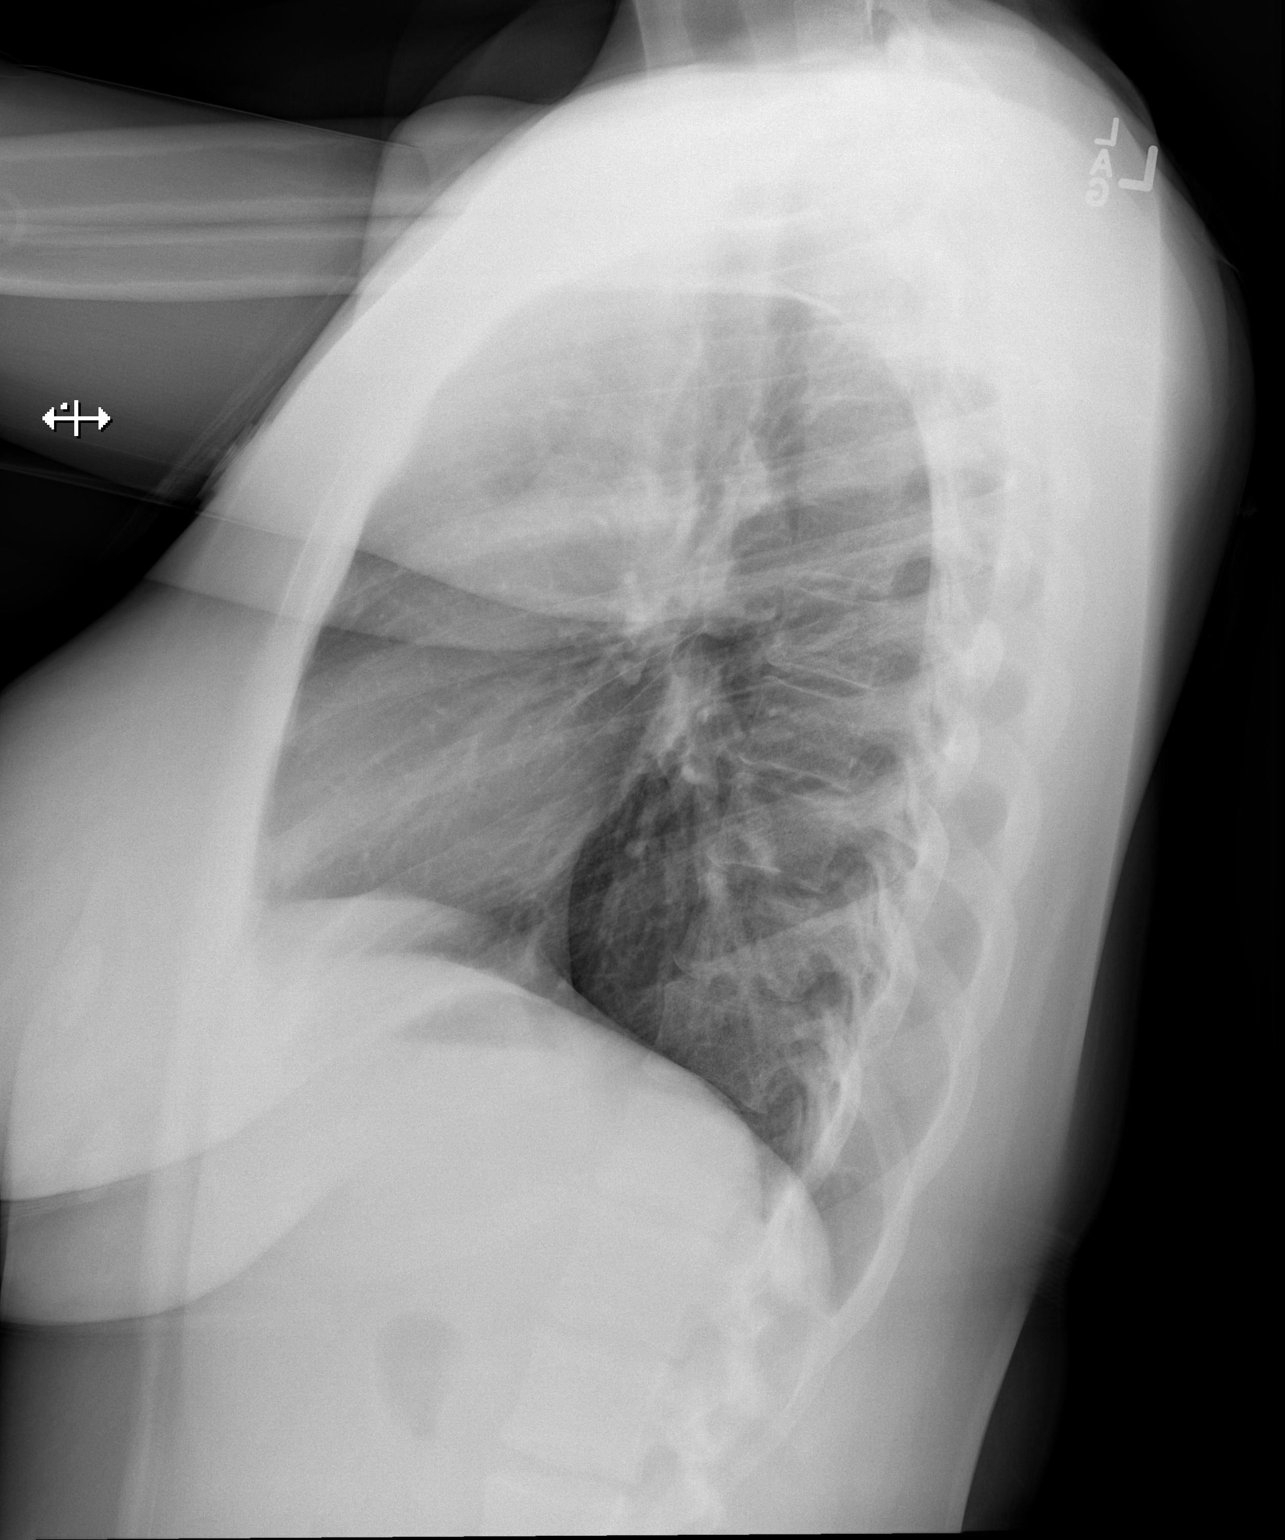

[2 of 2 positions shown; findings below may reference images not displayed]

FINDINGS: Lung volumes are stable compatible with good inspiratory effort.
Mediastinal contours remain normal. Visualized tracheal air column
is within normal limits. Both lungs appear clear. No pneumothorax or
pleural effusion. No osseous abnormality identified. Negative
visible bowel gas pattern.
IMPRESSION: Negative.  No acute cardiopulmonary abnormality.

## 2018-01-27 NOTE — ED Triage Notes (Signed)
Pt c/o central chest pain and low back pain.  Hurts worse when she lays down.  C/o nausea but no vomiting or diarrhea.  No SHOB.

## 2018-01-28 ENCOUNTER — Emergency Department (HOSPITAL_COMMUNITY)
Admission: EM | Admit: 2018-01-28 | Discharge: 2018-01-28 | Disposition: A | Discharge status: Home / Self Care | Payer: Medicaid Other | Attending: Emergency Medicine | Admitting: Emergency Medicine

## 2018-01-28 DIAGNOSIS — R0789 Other chest pain: Secondary | ICD-10-CM

## 2018-01-28 MED ORDER — NAPROXEN 500 MG PO TABS: 500.0000 mg | ORAL_TABLET | Freq: Two times a day (BID) | ORAL | 0 refills | Status: DC

## 2018-01-28 NOTE — Discharge Instructions (Signed)
Your work-up today did not reveal a concerning cause of your symptoms.  We recommend follow-up with a primary care doctor for ongoing chest pain complaints.  Take naproxen as prescribed.  Do not take this medication with Motrin, Advil, ibuprofen.  You may try icing areas of pain to limit inflammation.  Return to the ED for new or concerning symptoms.

## 2018-01-28 NOTE — ED Provider Notes (Signed)
[facility]-EMERGENCY DEPT Provider Note   CSN: [id number] Arrival date & time: 01/27/18  2207     History   Chief Complaint Chief Complaint  Patient presents with  . Chest Pain  . Back Pain    HPI Frances [name] is a 22 y.o. female.  22 year old female presents to the emergency department for evaluation of intermittent chest pain described as an aching sensation.  This is nonradiating and is without modifying factors.  Patient denies worsening pain with exertion.  She has had some mild nausea, but denies associated shortness of breath, diaphoresis, syncope, fevers, vomiting, diarrhea.  She is not on birth control and has not had any surgeries or hospitalizations in the past 4 weeks.  No recent travel.  She further denies leg swelling, hemoptysis.  No medications taken prior to arrival for symptoms.  The history is provided by the patient. No language interpreter was used.  Chest Pain   This is a new problem. The current episode started 2 days ago. The problem occurs daily. The problem has not changed since onset.The pain is present in the substernal region. The pain is mild. Quality: aching. The pain does not radiate. Exacerbated by: unknown. Pertinent negatives include no abdominal pain, no diaphoresis, no exertional chest pressure, no fever, no irregular heartbeat, no lower extremity edema, no nausea, no near-syncope, no palpitations, no syncope and no vomiting. She has tried nothing for the symptoms.  Pertinent negatives for past medical history include no hypertension, no MI and no PE.    Past Medical History:  Diagnosis Date  . Anemia    on Iron supplements  . Asthma 1999   as a child; no current problems  . Chronic kidney disease    as a child  . Constipation   . Gall stones   . Seizures (HCC)    seizure disorder as a child, treated with medication; none since  . UTI (lower urinary tract infection)     Patient Active Problem List   Diagnosis  Date Noted  . Irregular menstrual cycle 06/24/2017  . Chest pain 06/23/2017  . Status post debridement 06/11/2017  . Cellulitis and abscess of trunk 06/10/2017  . Gallstones 10/23/2016  . Healthcare maintenance 09/16/2016  . Recurrent UTI 09/16/2016    Past Surgical History:  Procedure Laterality Date  . IRRIGATION AND DEBRIDEMENT ABSCESS Left 06/11/2017   Procedure: IRRIGATION, DRAINAGE AND DEBRIDEMENT LEFT FLANK NECROTIZING SOFT TISSUE INFECTION;  Surgeon: Avel Peace, MD;  Location: [facility];  Service: General;  Laterality: Left;  . TYMPANOSTOMY TUBE PLACEMENT    . WOUND DEBRIDEMENT Left 06/12/2017   Procedure: DRESSING CHANGE AND DEBRIDEMENT OF WOUND;  Surgeon: Avel Peace, MD;  Location: [facility];  Service: General;  Laterality: Left;     OB History    Gravida  1   Para  1   Term  1   Preterm  0   AB  0   Living  1     SAB  0   TAB  0   Ectopic  0   Multiple  0   Live Births  1            Home Medications    Prior to Admission medications   Medication Sig Start Date End Date Taking? Authorizing Provider  ibuprofen (ADVIL,MOTRIN) 600 MG tablet Take 1 tablet (600 mg total) by mouth every 6 (six) hours. 03/01/17  Yes Talbert Forest, [place], DO  ferrous gluconate (FERGON) 324 MG tablet Take  1 tablet (324 mg total) by mouth 2 (two) times daily with a meal. Patient not taking: Reported on 01/28/2018 12/24/16   Gary BingPickens, Charlie, MD  naproxen (NAPROSYN) 500 MG tablet Take 1 tablet (500 mg total) by mouth 2 (two) times daily. 01/28/18   Antony MaduraHumes, Crystle Carelli, PA-C    Family History Family History  Problem Relation Age of Onset  . Asthma Mother   . Hypertension Mother   . Asthma Sister   . Birth defects Sister     Social History Social History   Tobacco Use  . Smoking status: Never Smoker  . Smokeless tobacco: Never Used  Substance Use Topics  . Alcohol use: No    Comment: occ  . Drug use: Yes    Frequency: 2.0 times per week    Types: Marijuana    Comment:  quit 06/09/2017     Allergies   Vancomycin   Review of Systems Review of Systems  Constitutional: Negative for diaphoresis and fever.  Cardiovascular: Positive for chest pain. Negative for palpitations, syncope and near-syncope.  Gastrointestinal: Negative for abdominal pain, nausea and vomiting.  Ten systems reviewed and are negative for acute change, except as noted in the HPI.    Physical Exam Updated Vital Signs BP 109/71 (BP Location: Right Arm)   Pulse 71   Temp 97.9 F (36.6 C) (Oral)   Resp 18   LMP 01/16/2018   SpO2 100%   Physical Exam  Constitutional: She is oriented to person, place, and time. She appears well-developed and well-nourished. No distress.  Nontoxic appearing and in NAD  HENT:  Head: Normocephalic and atraumatic.  Eyes: Conjunctivae and EOM are normal. No scleral icterus.  Neck: Normal range of motion.  Cardiovascular: Normal rate, regular rhythm and intact distal pulses.  Pulmonary/Chest: Effort normal. No stridor. No respiratory distress. She has no wheezes. She has no rales.  Lungs CTAB. Respirations even and unlabored. TTP to the central chest. No crepitus or deformity.  Musculoskeletal: Normal range of motion.  Neurological: She is alert and oriented to person, place, and time. She exhibits normal muscle tone. Coordination normal.  GCS 15. Patient moving all extremities.  Skin: Skin is warm and dry. No rash noted. She is not diaphoretic. No erythema. No pallor.  Psychiatric: She has a normal mood and affect. Her behavior is normal.  Nursing note and vitals reviewed.    ED Treatments / Results  Labs (all labs ordered are listed, but only abnormal results are displayed) Labs Reviewed  BASIC METABOLIC PANEL - Abnormal; Notable for the following components:      Result Value   Potassium 3.4 (*)    Glucose, Bld 105 (*)    All other components within normal limits  CBC - Abnormal; Notable for the following components:   WBC 3.5 (*)     RBC 3.55 (*)    Hemoglobin 10.9 (*)    HCT 32.2 (*)    All other components within normal limits  I-STAT TROPONIN, ED  I-STAT BETA HCG BLOOD, ED ([facility], [facility], [facility] ONLY)    EKG ED ECG REPORT   Date: 01/28/2018  Rate: 76  Rhythm: normal sinus rhythm and sinus arrhythmia  QRS Axis: normal  Intervals: normal  ST/T Wave abnormalities: normal  Conduction Disutrbances:none  Narrative Interpretation: NSR with sinus arrhythmia  Old EKG Reviewed: none available  I have personally reviewed the EKG tracing and agree with the computerized printout as noted.   Radiology Dg Chest 2 View  Result Date: 01/27/2018  CLINICAL DATA:  22 year old female with mid chest pain and nausea for 24 hours. EXAM: CHEST - 2 VIEW COMPARISON:  06/19/2016 chest radiographs. FINDINGS: Lung volumes are stable compatible with good inspiratory effort. Mediastinal contours remain normal. Visualized tracheal air column is within normal limits. Both lungs appear clear. No pneumothorax or pleural effusion. No osseous abnormality identified. Negative visible bowel gas pattern. IMPRESSION: Negative.  No acute cardiopulmonary abnormality. Electronically Signed   By: Odessa Fleming M.D.   On: 01/27/2018 22:46    Procedures Procedures (including critical care time)  Medications Ordered in ED Medications - No data to display   Initial Impression / Assessment and Plan / ED Course  I have reviewed the triage vital signs and the nursing notes.  Pertinent labs & imaging results that were available during my care of the patient were reviewed by me and considered in my medical decision making (see chart for details).     Patient presents to the emergency department for evaluation of chest pain.  Low suspicion for cardiac etiology given reassuring workup today.  EKG is nonischemic and troponin negative.  Patient has a heart score of 0 consistent with low risk of acute coronary event.  Chest x-ray without evidence of mediastinal widening to  suggest dissection.  No pneumothorax, pneumonia, pleural effusion.  Pulmonary embolus further considered; however, patient without tachycardia, tachypnea, dyspnea, hypoxia.  Patient is PERC negative.  Given reproducibility of pain on palpation, suspect musculoskeletal etiology.  Will discharge with a course of NSAIDs.  Patient to follow-up with her primary care doctor for symptoms.  Return precautions discussed and provided. Patient discharged in stable condition with no unaddressed concerns.   Final Clinical Impressions(s) / ED Diagnoses   Final diagnoses:  Chest wall pain    ED Discharge Orders        Ordered    naproxen (NAPROSYN) 500 MG tablet  2 times daily     01/28/18 0251       Antony Madura, PA-C 01/28/18 1610    Geoffery Lyons, MD 01/28/18 [id number]

## 2018-03-16 ENCOUNTER — Other Ambulatory Visit: Payer: Self-pay

## 2018-03-16 ENCOUNTER — Encounter: Payer: Self-pay | Admitting: Family Medicine

## 2018-03-16 ENCOUNTER — Ambulatory Visit (INDEPENDENT_AMBULATORY_CARE_PROVIDER_SITE_OTHER): Payer: Medicaid Other | Admitting: Family Medicine

## 2018-03-16 VITALS — BP 110/70 | HR 85 | Temp 98.3°F | Wt 156.0 lb

## 2018-03-16 DIAGNOSIS — M545 Low back pain, unspecified: Secondary | ICD-10-CM

## 2018-03-16 DIAGNOSIS — G8929 Other chronic pain: Secondary | ICD-10-CM | POA: Insufficient documentation

## 2018-03-16 NOTE — Assessment & Plan Note (Signed)
Patient does little exercise, stands all day at work and has no concerning symtpoms  Advised to stretch/yoga, do core exercises at home, and consider tylenol for additional pain control.  Will come back in 2 months if no improvement or faster if symptoms worsen.

## 2018-03-16 NOTE — Progress Notes (Signed)
    Subjective:  Frances [name] is a 22 y.o. female [facility] with a chief complaint of chronic back pain.   HPI: Patient with chronic mild back pain.   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  She does very little exercising and described herself as "not very flexible".  She sometimes does some "air squats" but other than work she is not physically active.  She has no radiation, no paralysis/paresthesia/incontinence/fever.  She does not use IV drugs.  She has no bowel or urinary changes.  She has been using low dose ibuprofen for pain.  Pain is centered on left app level with PSIS.   Objective:  Physical Exam: BP 110/70   Pulse 85   Temp 98.3 F (36.8 C) (Oral)   Wt 156 lb (70.8 kg)   LMP 02/25/2018   SpO2 99%   BMI 25.96 kg/m   Gen: NAD, resting comfortably CV: RRR with no murmurs appreciated Pulm: NWOB, CTAB with no crackles, wheezes, or rhonchi GI: Normal bowel sounds present. Soft, Nontender, Nondistended. MSK: no edema, cyanosis, or clubbing noted.  Stiff lower back with less than 90deg flexion but no specific painful positions.  Well healed scar from Nov'18 app 3" below center of pain, no erythema or palpable deformity at site of pain Skin: warm, dry Neuro: grossly normal, moves all extremities Psych: Normal affect and thought content  No results found for this or any previous visit (from the past 72 hour(s)).   Assessment/Plan:  Chronic left-sided low back pain without sciatica Patient does little exercise, stands all day at work and has no concerning symtpoms  Advised to stretch/yoga, do core exercises at home, and consider tylenol for additional pain control.  Will come back in 2 months if no improvement or faster if symptoms worsen.   Marthenia RollingScott Bethel Gaglio, DO FAMILY MEDICINE RESIDENT - PGY2 03/16/2018 3:29 PM

## 2018-03-16 NOTE — Patient Instructions (Signed)
It was a pleasure to see you today! Thank you for choosing [facility] for your primary care. Frances [name] was seen for chronic back pain. Come back to the clinic if you have any routine concerns, and go to the emergency room if you have any life threatening problems.   Take a few months and work on your stretches, core/abdominal exercises and add tylenol for your headache pain.  If you don't hear from us in two weeks, please give us a call to verify your results. Otherwise, we look forward to seeing you again at your next visit. If you have any questions or concerns before then, please call the clinic at [phone].   Please bring all your medications to every doctors visit   Sign up for My Chart to have easy access to your labs results, and communication with your Primary care physician.     Please check-out at the front desk before leaving the clinic.     Best,  Dr. Marthenia RollingScott Shruthi [name] FAMILY MEDICINE RESIDENT - PGY2 03/16/2018 2:41 PM

## 2018-04-30 ENCOUNTER — Ambulatory Visit: Payer: Medicaid Other | Admitting: Family Medicine

## 2018-05-01 ENCOUNTER — Ambulatory Visit (HOSPITAL_COMMUNITY)
Admission: EM | Admit: 2018-05-01 | Discharge: 2018-05-01 | Disposition: A | Discharge status: Home / Self Care | Payer: Medicaid Other | Attending: Family Medicine | Admitting: Family Medicine

## 2018-05-01 ENCOUNTER — Emergency Department (HOSPITAL_COMMUNITY)
Admission: EM | Admit: 2018-05-01 | Discharge: 2018-05-02 | Disposition: A | Discharge status: Home / Self Care | Payer: Medicaid Other | Attending: Emergency Medicine | Admitting: Emergency Medicine

## 2018-05-01 ENCOUNTER — Encounter (HOSPITAL_COMMUNITY): Payer: Self-pay | Admitting: Emergency Medicine

## 2018-05-01 ENCOUNTER — Other Ambulatory Visit: Payer: Self-pay

## 2018-05-01 ENCOUNTER — Emergency Department (HOSPITAL_COMMUNITY): Payer: Medicaid Other

## 2018-05-01 DIAGNOSIS — M545 Low back pain, unspecified: Secondary | ICD-10-CM

## 2018-05-01 DIAGNOSIS — R197 Diarrhea, unspecified: Secondary | ICD-10-CM

## 2018-05-01 DIAGNOSIS — N189 Chronic kidney disease, unspecified: Secondary | ICD-10-CM | POA: Insufficient documentation

## 2018-05-01 DIAGNOSIS — R110 Nausea: Secondary | ICD-10-CM | POA: Insufficient documentation

## 2018-05-01 DIAGNOSIS — J029 Acute pharyngitis, unspecified: Secondary | ICD-10-CM | POA: Insufficient documentation

## 2018-05-01 DIAGNOSIS — R509 Fever, unspecified: Secondary | ICD-10-CM

## 2018-05-01 DIAGNOSIS — G8929 Other chronic pain: Secondary | ICD-10-CM | POA: Insufficient documentation

## 2018-05-01 DIAGNOSIS — Z87898 Personal history of other specified conditions: Secondary | ICD-10-CM

## 2018-05-01 DIAGNOSIS — K8020 Calculus of gallbladder without cholecystitis without obstruction: Secondary | ICD-10-CM

## 2018-05-01 DIAGNOSIS — R350 Frequency of micturition: Secondary | ICD-10-CM

## 2018-05-01 DIAGNOSIS — J45909 Unspecified asthma, uncomplicated: Secondary | ICD-10-CM | POA: Insufficient documentation

## 2018-05-01 LAB — CBC
HEMATOCRIT: 36.6 % (ref 36.0–46.0)
Hemoglobin: 12 g/dL (ref 12.0–15.0)
MCH: 30.2 pg (ref 26.0–34.0)
MCHC: 32.8 g/dL (ref 30.0–36.0)
MCV: 92 fL (ref 78.0–100.0)
Platelets: 243 10*3/uL (ref 150–400)
RBC: 3.98 MIL/uL (ref 3.87–5.11)
RDW: 12 % (ref 11.5–15.5)
WBC: 5.3 10*3/uL (ref 4.0–10.5)

## 2018-05-01 LAB — COMPREHENSIVE METABOLIC PANEL
ALBUMIN: 3.8 g/dL (ref 3.5–5.0)
ALT: 17 U/L (ref 0–44)
AST: 27 U/L (ref 15–41)
Alkaline Phosphatase: 52 U/L (ref 38–126)
Anion gap: 11 (ref 5–15)
BILIRUBIN TOTAL: 0.8 mg/dL (ref 0.3–1.2)
BUN: 8 mg/dL (ref 6–20)
CHLORIDE: 103 mmol/L (ref 98–111)
CO2: 22 mmol/L (ref 22–32)
CREATININE: 0.66 mg/dL (ref 0.44–1.00)
Calcium: 9 mg/dL (ref 8.9–10.3)
GFR calc Af Amer: >60 mL/min (ref >60)
GFR calc non Af Amer: >60 mL/min (ref >60)
GLUCOSE: 103 mg/dL — AB (ref 70–99)
Potassium: 3.5 mmol/L (ref 3.5–5.1)
Sodium: 136 mmol/L (ref 135–145)
TOTAL PROTEIN: 7.7 g/dL (ref 6.5–8.1)

## 2018-05-01 LAB — LIPASE, BLOOD: Lipase: 28 U/L (ref 11–51)

## 2018-05-01 LAB — I-STAT CG4 LACTIC ACID, ED: LACTIC ACID, VENOUS: 1.2 mmol/L (ref 0.5–1.9)

## 2018-05-01 LAB — I-STAT BETA HCG BLOOD, ED (MC, WL, AP ONLY): I-stat hCG, quantitative: <5 m[IU]/mL (ref <5)

## 2018-05-01 MED ORDER — ACETAMINOPHEN 325 MG PO TABS
ORAL_TABLET | ORAL | Status: AC
  Filled 2018-05-01: qty 2

## 2018-05-01 MED ORDER — ONDANSETRON 4 MG PO TBDP
4.0000 mg | ORAL_TABLET | Freq: Once | ORAL | Status: AC
  Administered 2018-05-01: 4 mg via ORAL

## 2018-05-01 MED ORDER — ACETAMINOPHEN 325 MG PO TABS
650.0000 mg | ORAL_TABLET | Freq: Once | ORAL | Status: AC
  Administered 2018-05-01: 650 mg via ORAL

## 2018-05-01 MED ORDER — ONDANSETRON 4 MG PO TBDP
ORAL_TABLET | ORAL | Status: AC
Start: 2018-05-01 — End: ?
  Filled 2018-05-01: qty 1

## 2018-05-01 MED ORDER — IOHEXOL 300 MG/ML  SOLN
100.0000 mL | Freq: Once | INTRAMUSCULAR | Status: AC | PRN
  Administered 2018-05-01: 100 mL via INTRAVENOUS

## 2018-05-01 NOTE — ED Triage Notes (Signed)
Pt reports diarrhea, nausea, fever, and chills for the last 3 days. Pt has RUQ and epigastric abd pain. Pt also endorses lower back pain, had surgery last November where an abscess was removed. Pt took two Tylenol at urgent care 15 minutes ago and they sent her here.

## 2018-05-01 NOTE — ED Notes (Signed)
Pt called 3x with no answer  

## 2018-05-01 NOTE — ED Provider Notes (Signed)
MRN: [id number] DOB: [birth year]  Subjective:   Frances [name] is a 22 y.o. female presenting for 3 day history of fever, diarrhea started yesterday (has had ~7 loose bowel movements), nausea without vomiting, intermittent general mild belly pain. Has had some urinary frequency. Denies dysuria, hematuria. Has a history of recurrent UTIs as a child. Has a smaller than average kidney on 1 side. Patient is currently on her cycle. Patient was hospitalized in 06/2017 for intraabdominal abscess. Her symptoms feel similar for this particular episode.    Current Facility-Administered Medications:  .  acetaminophen (TYLENOL) tablet 650 mg, 650 mg, Oral, Once, Mardella Layman, MD .  ondansetron (ZOFRAN-ODT) disintegrating tablet 4 mg, 4 mg, Oral, Once, Mardella Layman, MD  Current Outpatient Medications:  .  ibuprofen (ADVIL,MOTRIN) 600 MG tablet, Take 1 tablet (600 mg total) by mouth every 6 (six) hours., Disp: 30 tablet, Rfl: 0 .  naproxen (NAPROSYN) 500 MG tablet, Take 1 tablet (500 mg total) by mouth 2 (two) times daily., Disp: 30 tablet, Rfl: 0     Allergies  Allergen Reactions  . Vancomycin     Nephrotoxicity    Past Medical History:  Diagnosis Date  . Anemia    on Iron supplements  . Asthma 1999   as a child; no current problems  . Chronic kidney disease    as a child  . Constipation   . Gall stones   . Seizures (HCC)    seizure disorder as a child, treated with medication; none since  . UTI (lower urinary tract infection)      Past Surgical History:  Procedure Laterality Date  . IRRIGATION AND DEBRIDEMENT ABSCESS Left 06/11/2017   Procedure: IRRIGATION, DRAINAGE AND DEBRIDEMENT LEFT FLANK NECROTIZING SOFT TISSUE INFECTION;  Surgeon: Avel Peace, MD;  Location: [facility];  Service: General;  Laterality: Left;  . TYMPANOSTOMY TUBE PLACEMENT    . WOUND DEBRIDEMENT Left 06/12/2017   Procedure: DRESSING CHANGE AND DEBRIDEMENT OF WOUND;  Surgeon: Avel Peace, MD;  Location: [facility];  Service: General;  Laterality: Left;    Objective:   Vitals: BP (!) 102/55 (BP Location: Left Arm)   Pulse (!) 123   Temp (!) 102.9 F (39.4 C) (Oral)   LMP 04/28/2018 (Exact Date)   SpO2 100%   Physical Exam  Constitutional: She is oriented to person, place, and time. She appears well-developed and well-nourished.  HENT:  Mucous membranes are dry.  Eyes: Pupils are equal, round, and reactive to light. EOM are normal. Right eye exhibits no discharge. Left eye exhibits no discharge. No scleral icterus.  Cardiovascular: Normal rate, regular rhythm, normal heart sounds and intact distal pulses. Exam reveals no gallop and no friction rub.  No murmur heard. Pulmonary/Chest: Effort normal and breath sounds normal. No stridor. No respiratory distress. She has no wheezes. She has no rales.  Abdominal: Soft. Bowel sounds are normal. She exhibits no distension and no mass. There is tenderness (diffuse throughout and worse over mid abdomen). There is no rebound and no guarding.  Neurological: She is alert and oriented to person, place, and time. Coordination normal.  Appears lethargic.  Skin: Skin is warm and dry. No rash noted. No erythema. There is pallor.   No results found for this or any previous visit (from the past 24 hour(s)).  Assessment and Plan :   Nausea  Diarrhea, unspecified type  Fever, unspecified  Urinary frequency  History of abdominal abscess  Gallstones  Given patient's recent medical history of needing  surgical debridement for an abdominal abscess and signs of sepsis I directed patient to the emergency room for immediate evaluation.  Patient did receive Tylenol and Zofran in clinic.  Differential discussed with her and her family member, they verbalized understanding and are in agreement with treatment plan.   Wallis BambergMani, Maricus Tanzi, PA-C 05/01/18 1736

## 2018-05-01 NOTE — ED Provider Notes (Signed)
Patient placed in Quick Look pathway, seen and evaluated   Chief Complaint:   HPI:  Frances [name] is a 22 y.o. female with hx of large flank abscess with necrotizing soft tissue infection requiring hospitalization and surgery 06/11/17 presents to the ED with with abdominal pain. Pt reports diarrhea, nausea, fever, and chills for the last 3 days. Pt has RUQ and epigastric abd pain. Pt also endorses lower back pain,   Pt took two Tylenol at urgent care 15 minutes ago and they sent her here.   ROS: GI: nausea, diarrhea, abdominal pain  Physical Exam:  BP 108/65 (BP Location: Right Arm)   Pulse (!) 118   Temp (!) 101.3 F (38.5 C) (Oral)   Resp 16   Ht 5\' 7"  (1.702 m)   Wt 70.3 kg   LMP 04/28/2018 (Exact Date)   SpO2 99%   BMI 24.28 kg/m    Gen: No distress  Neuro: Awake and Alert  Skin: Warm and dry  Heart: tachycardic  Abdomen: soft, generalized tenderness with increased pain in the epigastric area.   Initiation of care has begun. The patient has been counseled on the process, plan, and necessity for staying for the completion/evaluation, and the remainder of the medical screening examination    Janne Napoleoneese, Navina Wohlers M, NP 05/01/18 1806    Charlynne PanderYao, David Hsienta, MD 05/02/18 1346

## 2018-05-01 NOTE — Discharge Instructions (Signed)
Please report to the ER immediately as I am worried that you are showing signs of sepsis secondary to intra-abdominal abscess versus cholecystitis versus other severe abdominal infection.

## 2018-05-01 NOTE — ED Triage Notes (Signed)
Pt reports diarrhea, nausea, and lower back pain for three days.  She also reports chills.

## 2018-05-02 LAB — URINALYSIS, ROUTINE W REFLEX MICROSCOPIC
BACTERIA UA: NONE SEEN
Bilirubin Urine: NEGATIVE
Glucose, UA: NEGATIVE mg/dL
Ketones, ur: 5 mg/dL — AB
Leukocytes, UA: NEGATIVE
Nitrite: NEGATIVE
Protein, ur: 30 mg/dL — AB
Specific Gravity, Urine: >1.046 — ABNORMAL HIGH (ref 1.005–1.030)
pH: 6 (ref 5.0–8.0)

## 2018-05-02 LAB — GROUP A STREP BY PCR: Group A Strep by PCR: NOT DETECTED

## 2018-05-02 LAB — MONONUCLEOSIS SCREEN: MONO SCREEN: NEGATIVE

## 2018-05-02 MED ORDER — CYCLOBENZAPRINE HCL 10 MG PO TABS: 10.0000 mg | ORAL_TABLET | Freq: Two times a day (BID) | ORAL | 0 refills | Status: DC | PRN

## 2018-05-02 NOTE — ED Notes (Signed)
Pt verbalizes understanding of d/c instructions. Prescriptions reviewed with patient. Pt ambulatory at d/c with all belongings and with family.   

## 2018-05-02 NOTE — ED Provider Notes (Signed)
[facility] Provider Note   CSN: [id number] Arrival date & time: 05/01/18  1741     History   Chief Complaint Chief Complaint  Patient presents with  . Diarrhea  . Back Pain    HPI Frances [name] is a 22 y.o. female.  Patient presents to the emergency department with a chief complaint of diarrhea.  She also complains of chronic low back pain.  She reports having had the back pain for years, and had part of her muscle removed years ago.  She now also complains of sore throat, generalized body aches, and diarrhea.  She states that she "thinks I have the flu."  He has had a fever.  She denies any chest pain, shortness of breath, or productive cough.  Denies any bloody vomit or diarrhea.  Denies any dysuria, hematuria, or vaginal discharge.  She states that she is on her period.  The history is provided by the patient. No language interpreter was used.    Past Medical History:  Diagnosis Date  . Anemia    on Iron supplements  . Asthma 1999   as a child; no current problems  . Chronic kidney disease    as a child  . Constipation   . Gall stones   . Seizures (HCC)    seizure disorder as a child, treated with medication; none since  . UTI (lower urinary tract infection)     Patient Active Problem List   Diagnosis Date Noted  . Chronic left-sided low back pain without sciatica 03/16/2018  . Irregular menstrual cycle 06/24/2017  . Status post debridement 06/11/2017  . Gallstones 10/23/2016  . Healthcare maintenance 09/16/2016  . Recurrent UTI 09/16/2016    Past Surgical History:  Procedure Laterality Date  . IRRIGATION AND DEBRIDEMENT ABSCESS Left 06/11/2017   Procedure: IRRIGATION, DRAINAGE AND DEBRIDEMENT LEFT FLANK NECROTIZING SOFT TISSUE INFECTION;  Surgeon: Avel Peaceosenbower, Todd, MD;  Location: [facility];  Service: General;  Laterality: Left;  . TYMPANOSTOMY TUBE PLACEMENT    . WOUND DEBRIDEMENT Left 06/12/2017   Procedure: DRESSING  CHANGE AND DEBRIDEMENT OF WOUND;  Surgeon: Avel Peaceosenbower, Todd, MD;  Location: [facility];  Service: General;  Laterality: Left;     OB History    Gravida  1   Para  1   Term  1   Preterm  0   AB  0   Living  1     SAB  0   TAB  0   Ectopic  0   Multiple  0   Live Births  1            Home Medications    Prior to Admission medications   Medication Sig Start Date End Date Taking? Authorizing Provider  cyclobenzaprine (FLEXERIL) 10 MG tablet Take 1 tablet (10 mg total) by mouth 2 (two) times daily as needed for muscle spasms. 05/02/18   Roxy HorsemanBrowning, Crystale Giannattasio, PA-C  ibuprofen (ADVIL,MOTRIN) 600 MG tablet Take 1 tablet (600 mg total) by mouth every 6 (six) hours. Patient not taking: Reported on 05/02/2018 03/01/17   Shirley, [place], DO  naproxen (NAPROSYN) 500 MG tablet Take 1 tablet (500 mg total) by mouth 2 (two) times daily. Patient not taking: Reported on 05/02/2018 01/28/18   Antony MaduraHumes, Kelly, PA-C    Family History Family History  Problem Relation Age of Onset  . Asthma Mother   . Hypertension Mother   . Asthma Sister   . Birth defects Sister  Social History Social History   Tobacco Use  . Smoking status: Never Smoker  . Smokeless tobacco: Never Used  Substance Use Topics  . Alcohol use: No    Comment: occ  . Drug use: Yes    Frequency: 2.0 times per week    Types: Marijuana    Comment: quit 06/09/2017     Allergies   Vancomycin   Review of Systems Review of Systems  All other systems reviewed and are negative.    Physical Exam Updated Vital Signs BP 110/75   Pulse 88   Temp 97.8 F (36.6 C) (Oral)   Resp 16   Ht 5\' 7"  (1.702 m)   Wt 70.3 kg   LMP 04/28/2018 (Exact Date)   SpO2 100%   BMI 24.28 kg/m   Physical Exam  Constitutional: She is oriented to person, place, and time. She appears well-developed and well-nourished.  HENT:  Head: Normocephalic and atraumatic.  Eyes: Pupils are equal, round, and reactive to light. Conjunctivae and  EOM are normal.  Neck: Normal range of motion. Neck supple.  Cardiovascular: Normal rate and regular rhythm. Exam reveals no gallop and no friction rub.  No murmur heard. Pulmonary/Chest: Effort normal and breath sounds normal. No respiratory distress. She has no wheezes. She has no rales. She exhibits no tenderness.  Abdominal: Soft. Bowel sounds are normal. She exhibits no distension and no mass. There is no tenderness. There is no rebound and no guarding.  Abdomen is soft and nontender, there is no McBurney point tenderness, no Murphy sign  Musculoskeletal: Normal range of motion. She exhibits no edema or tenderness.  Neurological: She is alert and oriented to person, place, and time.  Skin: Skin is warm and dry.  Psychiatric: She has a normal mood and affect. Her behavior is normal. Judgment and thought content normal.  Nursing note and vitals reviewed.    ED Treatments / Results  Labs (all labs ordered are listed, but only abnormal results are displayed) Labs Reviewed  COMPREHENSIVE METABOLIC PANEL - Abnormal; Notable for the following components:      Result Value   Glucose, Bld 103 (*)    All other components within normal limits  URINALYSIS, ROUTINE W REFLEX MICROSCOPIC - Abnormal; Notable for the following components:   Specific Gravity, Urine >1.046 (*)    Hgb urine dipstick LARGE (*)    Ketones, ur 5 (*)    Protein, ur 30 (*)    All other components within normal limits  GROUP A STREP BY PCR  LIPASE, BLOOD  CBC  MONONUCLEOSIS SCREEN  I-STAT BETA HCG BLOOD, ED ([facility], [facility], [facility] ONLY)  I-STAT CG4 LACTIC ACID, ED    EKG None  Radiology Ct Abdomen Pelvis W Contrast  Result Date: 05/01/2018 CLINICAL DATA:  Diarrhea with nausea and low back pain for 3 days. EXAM: CT ABDOMEN AND PELVIS WITH CONTRAST TECHNIQUE: Multidetector CT imaging of the abdomen and pelvis was performed using the standard protocol following bolus administration of intravenous contrast. CONTRAST:   OMNIPAQUE IOHEXOL 300 MG/ML  SOLN COMPARISON:  06/10/2017 FINDINGS: Lower chest: Unremarkable Hepatobiliary: No focal abnormality within the liver parenchyma. There is no evidence for gallstones, gallbladder wall thickening, or pericholecystic fluid. No intrahepatic or extrahepatic biliary dilation. Pancreas: No focal mass lesion. No dilatation of the main duct. No intraparenchymal cyst. No peripancreatic edema. Spleen: No splenomegaly. No focal mass lesion. Adrenals/Urinary Tract: No adrenal nodule or mass. Kidneys unremarkable. No evidence for hydroureter. The urinary bladder appears normal for  the degree of distention. Stomach/Bowel: Stomach is nondistended. No gastric wall thickening. No evidence of outlet obstruction. Duodenum is normally positioned as is the ligament of Treitz. No small bowel wall thickening. No small bowel dilatation. The terminal ileum is normal. The appendix is normal. No gross colonic mass. No colonic wall thickening. No substantial diverticular change. Vascular/Lymphatic: No abdominal aortic aneurysm. No abdominal aortic atherosclerotic calcification. Prominent mesenteric nodes are stable to minimally progressed since prior study. There is no gastrohepatic or hepatoduodenal ligament lymphadenopathy. No intraperitoneal or retroperitoneal lymphadenopathy. No pelvic sidewall lymphadenopathy. Reproductive: Uterus unremarkable.  There is no adnexal mass. Other: No intraperitoneal free fluid. Musculoskeletal: No worrisome lytic or sclerotic osseous abnormality. IMPRESSION: No acute findings in the abdomen or pelvis. No findings to explain the patient's history of diarrhea and nausea. Electronically Signed   By: Kennith Center M.D.   On: 05/01/2018 20:56    Procedures Procedures (including critical care time)  Medications Ordered in ED Medications  iohexol (OMNIPAQUE) 300 MG/ML solution 100 mL (100 mLs Intravenous Contrast Given 05/01/18 2038)     Initial Impression / Assessment and  Plan / ED Course  I have reviewed the triage vital signs and the nursing notes.  Pertinent labs & imaging results that were available during my care of the patient were reviewed by me and considered in my medical decision making (see chart for details).     Sore throat, generalized body aches, and some diarrhea. She has no focal abdominal tenderness.  Laboratory work-up is otherwise reassuring.  CT ordered in triage is negative.  I will order rapid strep and mono given the patient's sore throat, but will allow patient to be discharged and will follow up on these results in the morning.  Patient given instructions for supportive therapies.  Patient understands and agrees with plan.  Rapid strep and mono are negative.  Final Clinical Impressions(s) / ED Diagnoses   Final diagnoses:  Diarrhea, unspecified type  Chronic left-sided low back pain without sciatica  Sore throat    ED Discharge Orders         Ordered    cyclobenzaprine (FLEXERIL) 10 MG tablet  2 times daily PRN     05/02/18 0251           Roxy Horseman, PA-C 05/02/18 4098    Gilda Crease, MD 05/02/18 [id number]

## 2018-05-02 NOTE — Discharge Instructions (Addendum)
Your symptoms are thought to be viral in nature at this time.  We have sent a strep test and mono test to the lab because of your sore throat.  We will call you if the results are positive.  Otherwise, please take Tylenol and Motrin for fever and pain control, stay hydrated, and get plenty of rest.  Return for new or worsening symptoms.

## 2018-05-07 ENCOUNTER — Ambulatory Visit: Payer: Medicaid Other | Admitting: Family Medicine

## 2018-06-22 ENCOUNTER — Ambulatory Visit (HOSPITAL_COMMUNITY)
Admission: EM | Admit: 2018-06-22 | Discharge: 2018-06-22 | Disposition: A | Discharge status: Home / Self Care | Payer: Medicaid Other | Attending: Family Medicine | Admitting: Family Medicine

## 2018-06-22 ENCOUNTER — Encounter (HOSPITAL_COMMUNITY): Payer: Self-pay | Admitting: Emergency Medicine

## 2018-06-22 ENCOUNTER — Other Ambulatory Visit: Payer: Self-pay

## 2018-06-22 DIAGNOSIS — Z3201 Encounter for pregnancy test, result positive: Secondary | ICD-10-CM | POA: Diagnosis not present

## 2018-06-22 DIAGNOSIS — R112 Nausea with vomiting, unspecified: Secondary | ICD-10-CM

## 2018-06-22 DIAGNOSIS — R0789 Other chest pain: Secondary | ICD-10-CM | POA: Diagnosis not present

## 2018-06-22 DIAGNOSIS — R109 Unspecified abdominal pain: Secondary | ICD-10-CM

## 2018-06-22 DIAGNOSIS — Z3491 Encounter for supervision of normal pregnancy, unspecified, first trimester: Secondary | ICD-10-CM

## 2018-06-22 DIAGNOSIS — M545 Low back pain, unspecified: Secondary | ICD-10-CM

## 2018-06-22 LAB — POCT URINALYSIS DIP (DEVICE)
Bilirubin Urine: NEGATIVE
GLUCOSE, UA: NEGATIVE mg/dL
HGB URINE DIPSTICK: NEGATIVE
KETONES UR: 80 mg/dL — AB
Leukocytes, UA: NEGATIVE
Nitrite: NEGATIVE
PH: 5.5 (ref 5.0–8.0)
PROTEIN: NEGATIVE mg/dL
UROBILINOGEN UA: 0.2 mg/dL (ref 0.0–1.0)

## 2018-06-22 LAB — POCT PREGNANCY, URINE: Preg Test, Ur: POSITIVE — AB

## 2018-06-22 MED ORDER — NAPROXEN 500 MG PO TABS: 500.0000 mg | ORAL_TABLET | Freq: Two times a day (BID) | ORAL | 0 refills | Status: DC

## 2018-06-22 MED ORDER — PRENATAL COMPLETE 14-0.4 MG PO TABS: 1.0000 | ORAL_TABLET | Freq: Every day | ORAL | 0 refills | Status: DC

## 2018-06-22 MED ORDER — OMEPRAZOLE 20 MG PO CPDR: 20.0000 mg | DELAYED_RELEASE_CAPSULE | Freq: Every day | ORAL | 0 refills | Status: DC

## 2018-06-22 MED ORDER — ACETAMINOPHEN 500 MG PO TABS: 500.0000 mg | ORAL_TABLET | Freq: Four times a day (QID) | ORAL | 0 refills | Status: DC | PRN

## 2018-06-22 MED ORDER — ONDANSETRON 4 MG PO TBDP: 4.0000 mg | ORAL_TABLET | Freq: Three times a day (TID) | ORAL | 0 refills | Status: DC | PRN

## 2018-06-22 NOTE — ED Provider Notes (Addendum)
[facility]    CSN: [id number] Arrival date & time: 06/22/18  1716     History   Chief Complaint Chief Complaint  Patient presents with  . Emesis    HPI Frances [name] is a 22 y.o. female.   Frances [name] presents with complaints of episodes of chest pain and left low back pain, occasional abdominal cramping, started today while at work. Has somewhat gotten better. Back aches. Had nausea and vomited twice today. No further nausea. No fevers. Pain after standing a lot today at work. Pain 7/10. No urinary symptoms. No recent travel, not on birth control, no leg pain or swelling. No shortness of breath . Hasn't taken any medications for symptoms. Has had similar episodes in the past, without significant findings. No diarrhea. Symptoms did not necessarily worsen with eating. LMP 10/15. Sexually active. Hx of anemia, asthma, constipation, gall stones, I&D to left flank, left low back pain.     ROS per HPI.      Past Medical History:  Diagnosis Date  . Anemia    on Iron supplements  . Asthma 1999   as a child; no current problems  . Chronic kidney disease    as a child  . Constipation   . Gall stones   . Seizures (HCC)    seizure disorder as a child, treated with medication; none since  . UTI (lower urinary tract infection)     Patient Active Problem List   Diagnosis Date Noted  . Chronic left-sided low back pain without sciatica 03/16/2018  . Irregular menstrual cycle 06/24/2017  . Status post debridement 06/11/2017  . Gallstones 10/23/2016  . Healthcare maintenance 09/16/2016  . Recurrent UTI 09/16/2016    Past Surgical History:  Procedure Laterality Date  . IRRIGATION AND DEBRIDEMENT ABSCESS Left 06/11/2017   Procedure: IRRIGATION, DRAINAGE AND DEBRIDEMENT LEFT FLANK NECROTIZING SOFT TISSUE INFECTION;  Surgeon: Avel Peace, MD;  Location: [facility];  Service: General;  Laterality: Left;  . TYMPANOSTOMY TUBE PLACEMENT    . WOUND DEBRIDEMENT Left  06/12/2017   Procedure: DRESSING CHANGE AND DEBRIDEMENT OF WOUND;  Surgeon: Avel Peace, MD;  Location: [facility];  Service: General;  Laterality: Left;    OB History    Gravida  1   Para  1   Term  1   Preterm  0   AB  0   Living  1     SAB  0   TAB  0   Ectopic  0   Multiple  0   Live Births  1            Home Medications    Prior to Admission medications   Medication Sig Start Date End Date Taking? Authorizing Provider  acetaminophen (TYLENOL) 500 MG tablet Take 1 tablet (500 mg total) by mouth every 6 (six) hours as needed. 06/22/18   Georgetta Haber, NP  Prenatal Vit-Fe Fumarate-FA (PRENATAL COMPLETE) 14-0.4 MG TABS Take 1 tablet by mouth daily. 06/22/18   Georgetta Haber, NP    Family History Family History  Problem Relation Age of Onset  . Asthma Mother   . Hypertension Mother   . Asthma Sister   . Birth defects Sister     Social History Social History   Tobacco Use  . Smoking status: Never Smoker  . Smokeless tobacco: Never Used  Substance Use Topics  . Alcohol use: No    Comment: occ  . Drug use: Yes    Frequency:  2.0 times per week    Types: Marijuana    Comment: quit 06/09/2017     Allergies   Vancomycin   Review of Systems Review of Systems   Physical Exam Triage Vital Signs ED Triage Vitals  Enc Vitals Group     BP 06/22/18 1841 120/73     Pulse Rate 06/22/18 1841 (!) 104     Resp 06/22/18 1841 18     Temp 06/22/18 1841 98.5 F (36.9 C)     Temp Source 06/22/18 1841 Oral     SpO2 06/22/18 1841 100 %     Weight --      Height --      Head Circumference --      Peak Flow --      Pain Score 06/22/18 1839 7     Pain Loc --      Pain Edu? --      Excl. in GC? --    No data found.  Updated Vital Signs BP 120/73 (BP Location: Left Arm)   Pulse (!) 104   Temp 98.5 F (36.9 C) (Oral)   Resp 18   LMP 05/26/2018   SpO2 100%   Visual Acuity Right Eye Distance:   Left Eye Distance:   Bilateral Distance:      Right Eye Near:   Left Eye Near:    Bilateral Near:     Physical Exam  Constitutional: She is oriented to person, place, and time. She appears well-developed and well-nourished. No distress.  Cardiovascular: Normal rate, regular rhythm and normal heart sounds.  Pulmonary/Chest: Effort normal and breath sounds normal. She exhibits tenderness.  Mild distal sternal chest pain on palpation   Abdominal: Soft. She exhibits no mass. There is no tenderness. There is no rigidity, no rebound, no guarding and no CVA tenderness.  Musculoskeletal:       Lumbar back: She exhibits tenderness and pain. She exhibits no bony tenderness, no swelling, no edema, no deformity, no laceration, no spasm and normal pulse.       Back:  Point tenderness to left low back musculature; strength equal bilaterally; gross sensation intact to lower extremities  Neurological: She is alert and oriented to person, place, and time.  Skin: Skin is warm and dry.     UC Treatments / Results  Labs (all labs ordered are listed, but only abnormal results are displayed) Labs Reviewed  POCT URINALYSIS DIP (DEVICE) - Abnormal; Notable for the following components:      Result Value   Ketones, ur 80 (*)    All other components within normal limits  POCT PREGNANCY, URINE - Abnormal; Notable for the following components:   Preg Test, Ur POSITIVE (*)    All other components within normal limits    EKG None  Radiology No results found.  Procedures Procedures (including critical care time)  Medications Ordered in UC Medications - No data to display  Initial Impression / Assessment and Plan / UC Course  I have reviewed the triage vital signs and the nursing notes.  Pertinent labs & imaging results that were available during my care of the patient were reviewed by me and considered in my medical decision making (see chart for details).     No cardiac risk factors, no PE risk factors. Has had chest pain and back pain  evaluation in the past, negative chest work up 01/2018. Symptoms have improved. Hasn't taken any medications for symptoms. Positive pregnancy test here today. Suspect this caused  her vomiting and intermittent abdominal cramping. Encouraged to start daily prenatal vitamin. Tylenol as needed for pain. To follow up with OB for first prenatal. Return precautions provided. Patient verbalized understanding and agreeable to plan.  Ambulatory out of clinic without difficulty. Medications safe for pregnancy list provided.   Final Clinical Impressions(s) / UC Diagnoses   Final diagnoses:  Acute left-sided low back pain without sciatica  Chest wall pain  First trimester pregnancy     Discharge Instructions     Exam is reassuring today, your back pain appears to be muscloskeletal in nature.  Tylenol as needed for pain.  Please start daily prenatal vitamin.   Please call Ob to set up first prenatal appointment.  If you develop abdominal pain, vaginal bleeding, or otherwise worsening please go to ER or women's hospital.     ED Prescriptions    Medication Sig Dispense Auth. Provider   naproxen (NAPROSYN) 500 MG tablet  (Status: Discontinued) Take 1 tablet (500 mg total) by mouth 2 (two) times daily. 30 tablet Linus Mako B, NP   omeprazole (PRILOSEC) 20 MG capsule  (Status: Discontinued) Take 1 capsule (20 mg total) by mouth daily. 30 capsule Keilly Fatula B, NP   ondansetron (ZOFRAN-ODT) 4 MG disintegrating tablet  (Status: Discontinued) Take 1 tablet (4 mg total) by mouth every 8 (eight) hours as needed for nausea or vomiting. 12 tablet Linus Mako B, NP   Prenatal Vit-Fe Fumarate-FA (PRENATAL COMPLETE) 14-0.4 MG TABS Take 1 tablet by mouth daily. 60 each Linus Mako B, NP   acetaminophen (TYLENOL) 500 MG tablet Take 1 tablet (500 mg total) by mouth every 6 (six) hours as needed. 30 tablet Georgetta Haber, NP     Controlled Substance Prescriptions [place] Controlled Substance Registry  consulted? Not Applicable   Georgetta Haber, NP 06/22/18 1927    Georgetta Haber, NP 06/22/18 1945

## 2018-06-22 NOTE — ED Triage Notes (Addendum)
Hot flashes, chills, nausea and vomiting, back pain started today.  Chest soreness in center chest

## 2018-06-22 NOTE — Discharge Instructions (Addendum)
Exam is reassuring today, your back pain appears to be muscloskeletal in nature.  Tylenol as needed for pain.  Please start daily prenatal vitamin.   Please call Ob to set up first prenatal appointment.  If you develop abdominal pain, vaginal bleeding, or otherwise worsening please go to ER or women's hospital.

## 2018-06-24 DIAGNOSIS — Z3009 Encounter for other general counseling and advice on contraception: Secondary | ICD-10-CM | POA: Diagnosis not present

## 2018-06-24 DIAGNOSIS — Z0389 Encounter for observation for other suspected diseases and conditions ruled out: Secondary | ICD-10-CM | POA: Diagnosis not present

## 2018-06-24 DIAGNOSIS — Z1388 Encounter for screening for disorder due to exposure to contaminants: Secondary | ICD-10-CM | POA: Diagnosis not present

## 2018-08-10 ENCOUNTER — Encounter: Payer: Medicaid Other | Admitting: Advanced Practice Midwife

## 2018-08-12 NOTE — L&D Delivery Note (Addendum)
Patient: Frances [name] MRN: [id number]  GBS status: Negative  Patient is a 23 y.o. now [id number] s/p NSVD at [redacted]w[redacted]d, who was admitted s/p delivery at home.   Delivery Note A viable female infant was delivered at home approximately around 2315 on 7/13. Placenta delivered around approximately 0010.  Placenta status: Spontaneous, intact with 25% adherent clot burden  Cord: 3 vessel, intact   Anesthesia: None  Episiotomy:  None  Lacerations: Superficial right vaginal  Est. Blood Loss (mL): ~50 here, can not assess blood loss during delivery.   Infant delivered at home without complications reported per mother. Brought in by EMS. Upon arrival to L&D, Placenta delivered spontaneously with gentle cord traction. Fundus firm with massage and Pitocin. Perineum inspected and found to have a small superficial vaginal abrasion, which was found to be hemostatic.  Mom to postpartum.  Baby to Couplet care / Skin to Skin.  Patriciaann Clan 02/23/2019, 12:30 AM   I was gloved and present for entire delivery of placenta and perineal assessment SVD without incident Lacerations as listed above  Clinic messaged 02/23/19 to confirm SVD and coordinate followup  Mallie Snooks, CNM 02/23/19 12:38 AM

## 2018-08-18 ENCOUNTER — Encounter: Payer: Self-pay | Admitting: *Deleted

## 2018-08-18 ENCOUNTER — Ambulatory Visit: Payer: Medicaid Other | Admitting: Clinical

## 2018-08-18 ENCOUNTER — Ambulatory Visit (INDEPENDENT_AMBULATORY_CARE_PROVIDER_SITE_OTHER): Payer: Medicaid Other | Admitting: *Deleted

## 2018-08-18 VITALS — BP 114/73 | HR 89 | Wt 154.9 lb

## 2018-08-18 DIAGNOSIS — Z3481 Encounter for supervision of other normal pregnancy, first trimester: Secondary | ICD-10-CM | POA: Diagnosis not present

## 2018-08-18 DIAGNOSIS — Z8709 Personal history of other diseases of the respiratory system: Secondary | ICD-10-CM

## 2018-08-18 DIAGNOSIS — Z87448 Personal history of other diseases of urinary system: Secondary | ICD-10-CM

## 2018-08-18 DIAGNOSIS — Z87441 Personal history of nephrotic syndrome: Secondary | ICD-10-CM | POA: Insufficient documentation

## 2018-08-18 DIAGNOSIS — Z8669 Personal history of other diseases of the nervous system and sense organs: Secondary | ICD-10-CM

## 2018-08-18 DIAGNOSIS — Z23 Encounter for immunization: Secondary | ICD-10-CM | POA: Diagnosis not present

## 2018-08-18 DIAGNOSIS — Z3490 Encounter for supervision of normal pregnancy, unspecified, unspecified trimester: Secondary | ICD-10-CM | POA: Diagnosis not present

## 2018-08-18 HISTORY — DX: Personal history of other diseases of the respiratory system: Z87.09

## 2018-08-18 HISTORY — DX: Personal history of other diseases of urinary system: Z87.448

## 2018-08-18 HISTORY — DX: Personal history of other diseases of the nervous system and sense organs: Z86.69

## 2018-08-18 NOTE — BH Specialist Note (Signed)
Integrated Behavioral Health Initial Visit  MRN: [id number] Name: Frances [name]  Number of Integrated Behavioral Health Clinician visits:: 1/6 Session Start time: 9:39  Session End time: 9:48 Total time: 15 minutes  Type of Service: Integrated Behavioral Health- Individual/Family Interpretor:No. Interpretor Name and Language: n/a   Warm Hand Off Completed.       SUBJECTIVE: Frances ParadiseCaaliegh N Dobbs is a 23 y.o. female accompanied by n/a Patient was referred by [place] Mutaiane Day, RN for Initial OB introduction to integrated behavioral health services . Patient reports the following symptoms/concerns: Pt states no particular concerns today Duration of problem: n/a; Severity of problem: n/a  OBJECTIVE: Mood: Normal and Affect: Appropriate Risk of harm to self or others: No plan to harm self or others  LIFE CONTEXT: Family and Social: - School/Work: - Self-Care: - Life Changes: Current pregnancy  GOALS ADDRESSED: Patient will: 1. Increase knowledge and/or ability of: healthy habits  INTERVENTIONS: Interventions utilized: Psychoeducation and/or Health Education  Standardized Assessments completed: GAD-7 and PHQ 9  ASSESSMENT: Patient currently experiencing Supervision of low risk pregnancy, antepartum   Patient may benefit from Initial OB introduction to integrated behavioral health services.  PLAN: 1. Follow up with behavioral health clinician on : As needed 2. Behavioral recommendations:  -Continue taking prenatal vitamin, as recommended by medical providers 3. Referral(s): Integrated Behavioral Health Services (In Clinic) 4. "From scale of 1-10, how likely are you to follow plan?": 10  Rae LipsJamie C Feliciana Narayan, LCSW  Depression screen [facility] 2/9 08/18/2018 03/16/2018 04/15/2017 02/25/2017 02/20/2017  Decreased Interest 0 0 0 0 0  Down, Depressed, Hopeless 0 0 0 0 0  PHQ - 2 Score 0 0 0 0 0  Altered sleeping 0 - 0 0 0  Tired, decreased energy 1 - 0 0 1  Change in appetite 0 - 0 0 0   Feeling bad or failure about yourself  0 - 0 0 0  Trouble concentrating 0 - 0 0 0  Moving slowly or fidgety/restless 0 - 0 0 0  Suicidal thoughts 0 - 0 0 0  PHQ-9 Score 1 - 0 0 1  Some recent data might be hidden   GAD 7 : Generalized Anxiety Score 08/18/2018 04/15/2017 02/25/2017 02/20/2017  Nervous, Anxious, on Edge 0 0 0 0  Control/stop worrying 0 0 0 0  Worry too much - different things 0 0 0 0  Trouble relaxing 0 0 0 0  Restless 0 0 0 0  Easily annoyed or irritable 0 0 0 0  Afraid - awful might happen 0 0 0 0  Total GAD 7 Score 0 0 0 0

## 2018-08-18 NOTE — Progress Notes (Signed)
New Ob intake completed and pregnancy information packet given. Flu vaccine given. Pt has no current c/o. New ob provider visit scheduled on 09/01/18.

## 2018-08-20 LAB — CULTURE, OB URINE

## 2018-08-20 LAB — URINE CULTURE, OB REFLEX

## 2018-08-21 NOTE — Progress Notes (Signed)
Chart reviewed for nurse visit. Agree with plan of care.   Marylene Land, CNM 08/21/2018 8:25 PM

## 2018-08-22 ENCOUNTER — Other Ambulatory Visit: Payer: Self-pay | Admitting: Student

## 2018-08-22 DIAGNOSIS — N390 Urinary tract infection, site not specified: Secondary | ICD-10-CM

## 2018-08-22 MED ORDER — AMOXICILLIN 875 MG PO TABS: 875.0000 mg | ORAL_TABLET | Freq: Two times a day (BID) | ORAL | 0 refills | Status: DC

## 2018-08-22 NOTE — Assessment & Plan Note (Signed)
E.coli in urine at NOB; rx for amox sent and clinical pool notified.

## 2018-08-24 ENCOUNTER — Encounter: Payer: Self-pay | Admitting: *Deleted

## 2018-08-26 LAB — SMN1 COPY NUMBER ANALYSIS (SMA CARRIER SCREENING)

## 2018-08-26 LAB — OBSTETRIC PANEL, INCLUDING HIV
Antibody Screen: NEGATIVE
BASOS ABS: 0 10*3/uL (ref 0.0–0.2)
Basos: 0 %
EOS (ABSOLUTE): 0 10*3/uL (ref 0.0–0.4)
EOS: 1 %
HEMOGLOBIN: 11.3 g/dL (ref 11.1–15.9)
HEP B S AG: NEGATIVE
HIV SCREEN 4TH GENERATION: NONREACTIVE
Hematocrit: 32.6 % — ABNORMAL LOW (ref 34.0–46.6)
Immature Grans (Abs): 0 10*3/uL (ref 0.0–0.1)
Immature Granulocytes: 0 %
LYMPHS ABS: 1.8 10*3/uL (ref 0.7–3.1)
Lymphs: 32 %
MCH: 31.5 pg (ref 26.6–33.0)
MCHC: 34.7 g/dL (ref 31.5–35.7)
MCV: 91 fL (ref 79–97)
MONOCYTES: 5 %
Monocytes Absolute: 0.3 10*3/uL (ref 0.1–0.9)
NEUTROS ABS: 3.4 10*3/uL (ref 1.4–7.0)
Neutrophils: 62 %
PLATELETS: 284 10*3/uL (ref 150–450)
RBC: 3.59 x10E6/uL — ABNORMAL LOW (ref 3.77–5.28)
RDW: 12.3 % (ref 11.7–15.4)
RH TYPE: POSITIVE
RPR: NONREACTIVE
RUBELLA: 0.96 {index} — AB (ref >0.99)
WBC: 5.5 10*3/uL (ref 3.4–10.8)

## 2018-08-28 ENCOUNTER — Encounter: Payer: Self-pay | Admitting: Student

## 2018-09-01 ENCOUNTER — Encounter: Payer: Medicaid Other | Admitting: Student

## 2018-09-08 ENCOUNTER — Encounter: Payer: Medicaid Other | Admitting: Student

## 2018-09-14 ENCOUNTER — Encounter: Payer: Self-pay | Admitting: Advanced Practice Midwife

## 2018-09-14 ENCOUNTER — Ambulatory Visit (INDEPENDENT_AMBULATORY_CARE_PROVIDER_SITE_OTHER): Payer: Medicaid Other | Admitting: Advanced Practice Midwife

## 2018-09-14 ENCOUNTER — Other Ambulatory Visit: Payer: Self-pay

## 2018-09-14 VITALS — BP 126/75 | HR 93 | Wt 155.1 lb

## 2018-09-14 DIAGNOSIS — Z3490 Encounter for supervision of normal pregnancy, unspecified, unspecified trimester: Secondary | ICD-10-CM

## 2018-09-14 DIAGNOSIS — Z3491 Encounter for supervision of normal pregnancy, unspecified, first trimester: Secondary | ICD-10-CM

## 2018-09-14 MED ORDER — DOXYLAMINE-PYRIDOXINE 10-10 MG PO TBEC: 1.0000 | DELAYED_RELEASE_TABLET | ORAL | 3 refills | Status: DC

## 2018-09-14 MED ORDER — DOXYLAMINE-PYRIDOXINE 10-10 MG PO TBEC: 2.0000 | DELAYED_RELEASE_TABLET | Freq: Every day | ORAL | 3 refills | Status: DC

## 2018-09-14 NOTE — Patient Instructions (Signed)

## 2018-09-14 NOTE — Progress Notes (Signed)
  Subjective:    Frances [name] is being seen today for her first obstetrical visit.  This is not a planned pregnancy. She is at [redacted]w[redacted]d gestation. Her obstetrical history is not significant. Relationship with FOB: significant other, not living together. Patient does intend to breast feed. Pregnancy history fully reviewed.  Patient reports nausea, denies vaginal bleeding, discharge, or cramping.  Review of Systems:   Review of Systems  Constitutional: Negative.   HENT: Negative.   Eyes: Negative.   Respiratory: Negative.   Cardiovascular: Negative.   Gastrointestinal: Positive for nausea and vomiting. Negative for abdominal distention, abdominal pain, constipation and diarrhea.  Endocrine: Negative.   Genitourinary: Negative.  Negative for dysuria, flank pain, hematuria, urgency, vaginal bleeding and vaginal discharge.  Musculoskeletal: Negative.   Skin: Negative.   Allergic/Immunologic: Negative.   Neurological: Negative.  Negative for dizziness and headaches.  Hematological: Negative.   Psychiatric/Behavioral: Negative.     Objective:     BP 126/75   Pulse 93   Wt 70.4 kg   LMP 05/26/2018 (Exact Date)   BMI 24.29 kg/m  Physical Exam  Nursing note and vitals reviewed. Constitutional: She is oriented to person, place, and time. She appears well-developed and well-nourished. No distress.  HENT:  Head: Normocephalic.  Eyes: Pupils are equal, round, and reactive to light.  Neck: Normal range of motion.  Cardiovascular: Normal rate, regular rhythm and normal heart sounds.  No murmur heard. Respiratory: Effort normal and breath sounds normal. No respiratory distress.  GI: Soft. Bowel sounds are normal. She exhibits no distension. There is no abdominal tenderness.  Genitourinary:    Uterus normal.   Musculoskeletal: Normal range of motion.  Neurological: She is alert and oriented to person, place, and time.  Skin: Skin is warm and dry. She is not diaphoretic.   Psychiatric: She has a normal mood and affect. Her behavior is normal. Judgment and thought content normal.    Maternal Exam:  Abdomen: Patient reports no abdominal tenderness.   Fetal Exam Fetal Monitor Review: Mode: hand-held doppler probe.   Baseline rate: 153.         Assessment:    Pregnancy: G2P1001 Patient Active Problem List   Diagnosis Date Noted  . Supervision of low-risk pregnancy 08/18/2018  . History of asthma 08/18/2018  . History of seizure disorder 08/18/2018  . History of kidney disease as a child 08/18/2018  . Gallstones 10/23/2016  . Recurrent UTI 09/16/2016  . Rubella non-immune status, antepartum 07/20/2016       Plan:     Initial labs reviewed. Prenatal vitamins being taken, Diclegis prescribed for nausea. Problem list reviewed and updated. AFP3 discussed: requested. Role of ultrasound in pregnancy discussed; fetal survey: requested. Amniocentesis discussed: not indicated. Reviewed the nature of [facility] with multiple MDs and other Advanced Practice Providers was explained to patient; also emphasized that residents, students are part of our team.  Follow up in 4 weeks. 50% of 40 min visit spent on counseling and coordination of care.   Bernerd Limbo, SNM 09/14/2018

## 2018-09-28 ENCOUNTER — Encounter (HOSPITAL_COMMUNITY): Payer: Self-pay

## 2018-10-06 ENCOUNTER — Ambulatory Visit (HOSPITAL_COMMUNITY): Payer: Medicaid Other

## 2018-10-12 ENCOUNTER — Ambulatory Visit (INDEPENDENT_AMBULATORY_CARE_PROVIDER_SITE_OTHER): Payer: Medicaid Other | Admitting: Advanced Practice Midwife

## 2018-10-12 ENCOUNTER — Encounter: Payer: Self-pay | Admitting: Advanced Practice Midwife

## 2018-10-12 VITALS — BP 104/72 | HR 95 | Wt 159.3 lb

## 2018-10-12 DIAGNOSIS — Z3492 Encounter for supervision of normal pregnancy, unspecified, second trimester: Secondary | ICD-10-CM

## 2018-10-12 DIAGNOSIS — Z3A19 19 weeks gestation of pregnancy: Secondary | ICD-10-CM

## 2018-10-12 DIAGNOSIS — Z3490 Encounter for supervision of normal pregnancy, unspecified, unspecified trimester: Secondary | ICD-10-CM | POA: Diagnosis not present

## 2018-10-12 NOTE — Progress Notes (Signed)
   PRENATAL VISIT NOTE  Subjective:  Frances [name] is a 23 y.o. G2P1001 at [redacted]w[redacted]d being seen today for ongoing prenatal care.  She is currently monitored for the following issues for this low-risk pregnancy and has Rubella non-immune status, antepartum; Recurrent UTI; Gallstones; Supervision of low-risk pregnancy; History of asthma; History of seizure disorder; and History of kidney disease as a child on their problem list.  Patient reports no complaints.  Contractions: Not present. Vag. Bleeding: None.  Movement: Present. Denies leaking of fluid.   The following portions of the patient's history were reviewed and updated as appropriate: allergies, current medications, past family history, past medical history, past social history, past surgical history and problem list. Problem list updated.  Objective:   Vitals:   10/12/18 1421  BP: 104/72  Pulse: 95  Weight: 159 lb 4.8 oz (72.3 kg)    Fetal Status: Fetal Heart Rate (bpm): 155 Fundal Height: 20 cm Movement: Present     General:  Alert, oriented and cooperative. Patient is in no acute distress.  Skin: Skin is warm and dry. No rash noted.   Cardiovascular: Normal heart rate noted  Respiratory: Normal respiratory effort, no problems with respiration noted  Abdomen: Soft, gravid, appropriate for gestational age.  Pain/Pressure: Absent     Pelvic: Cervical exam deferred        Extremities: Normal range of motion.  Edema: None  Mental Status: Normal mood and affect. Normal behavior. Normal judgment and thought content.   Assessment and Plan:  Pregnancy: G2P1001 at [redacted]w[redacted]d  1. Encounter for supervision of low-risk pregnancy, antepartum - Routine care - AFP, Serum, Open Spina Bifida  Preterm labor symptoms and general obstetric precautions including but not limited to vaginal bleeding, contractions, leaking of fluid and fetal movement were reviewed in detail with the patient. Please refer to After Visit Summary for other counseling  recommendations.  Return in about 4 weeks (around 11/09/2018).  Future Appointments  Date Time Provider Department Center  10/14/2018  1:30 PM [facility] NURSE [facility]  10/14/2018  1:45 PM [facility] [place] 2 [facility] M[facility]    [name] DNP, CNM  10/12/18  2:36 PM

## 2018-10-12 NOTE — Patient Instructions (Signed)

## 2018-10-14 ENCOUNTER — Ambulatory Visit (HOSPITAL_COMMUNITY): Payer: Medicaid Other

## 2018-10-14 LAB — AFP, SERUM, OPEN SPINA BIFIDA
AFP MoM: 0.75
AFP Value: 42.8 ng/mL
Gest. Age on Collection Date: 19.6 weeks
Maternal Age At EDD: 20.7 yr
OSBR Risk 1 IN: 10000
Test Results:: NEGATIVE
Weight: 159 [lb_av]

## 2018-10-22 ENCOUNTER — Ambulatory Visit (HOSPITAL_COMMUNITY)
Admission: RE | Admit: 2018-10-22 | Discharge: 2018-10-22 | Disposition: A | Discharge status: Home / Self Care | Payer: Medicaid Other | Source: Ambulatory Visit | Attending: Advanced Practice Midwife | Admitting: Advanced Practice Midwife

## 2018-10-22 ENCOUNTER — Other Ambulatory Visit: Payer: Self-pay

## 2018-10-22 ENCOUNTER — Other Ambulatory Visit (HOSPITAL_COMMUNITY): Payer: Self-pay | Admitting: *Deleted

## 2018-10-22 DIAGNOSIS — Z3490 Encounter for supervision of normal pregnancy, unspecified, unspecified trimester: Secondary | ICD-10-CM | POA: Insufficient documentation

## 2018-10-22 DIAGNOSIS — Z363 Encounter for antenatal screening for malformations: Secondary | ICD-10-CM | POA: Diagnosis not present

## 2018-10-22 DIAGNOSIS — Z3A21 21 weeks gestation of pregnancy: Secondary | ICD-10-CM

## 2018-10-22 DIAGNOSIS — Z362 Encounter for other antenatal screening follow-up: Secondary | ICD-10-CM

## 2018-11-02 ENCOUNTER — Encounter (HOSPITAL_COMMUNITY): Payer: Self-pay | Admitting: Emergency Medicine

## 2018-11-02 ENCOUNTER — Other Ambulatory Visit: Payer: Self-pay

## 2018-11-02 ENCOUNTER — Emergency Department (HOSPITAL_COMMUNITY)
Admission: EM | Admit: 2018-11-02 | Discharge: 2018-11-02 | Disposition: A | Discharge status: Home / Self Care | Payer: Medicaid Other | Attending: Emergency Medicine | Admitting: Emergency Medicine

## 2018-11-02 DIAGNOSIS — Z3A21 21 weeks gestation of pregnancy: Secondary | ICD-10-CM | POA: Diagnosis not present

## 2018-11-02 DIAGNOSIS — R55 Syncope and collapse: Secondary | ICD-10-CM | POA: Diagnosis not present

## 2018-11-02 DIAGNOSIS — O26812 Pregnancy related exhaustion and fatigue, second trimester: Secondary | ICD-10-CM | POA: Insufficient documentation

## 2018-11-02 DIAGNOSIS — O99512 Diseases of the respiratory system complicating pregnancy, second trimester: Secondary | ICD-10-CM | POA: Diagnosis not present

## 2018-11-02 DIAGNOSIS — J45909 Unspecified asthma, uncomplicated: Secondary | ICD-10-CM | POA: Diagnosis not present

## 2018-11-02 LAB — CBC WITH DIFFERENTIAL/PLATELET
Abs Immature Granulocytes: 0.11 10*3/uL — ABNORMAL HIGH (ref 0.00–0.07)
Basophils Absolute: 0 10*3/uL (ref 0.0–0.1)
Basophils Relative: 0 %
EOS PCT: 1 %
Eosinophils Absolute: 0.1 10*3/uL (ref 0.0–0.5)
HCT: 32.1 % — ABNORMAL LOW (ref 36.0–46.0)
Hemoglobin: 10.4 g/dL — ABNORMAL LOW (ref 12.0–15.0)
Immature Granulocytes: 1 %
Lymphocytes Relative: 19 %
Lymphs Abs: 1.5 10*3/uL (ref 0.7–4.0)
MCH: 30.6 pg (ref 26.0–34.0)
MCHC: 32.4 g/dL (ref 30.0–36.0)
MCV: 94.4 fL (ref 80.0–100.0)
MONO ABS: 0.6 10*3/uL (ref 0.1–1.0)
Monocytes Relative: 8 %
Neutro Abs: 5.5 10*3/uL (ref 1.7–7.7)
Neutrophils Relative %: 71 %
Platelets: 272 10*3/uL (ref 150–400)
RBC: 3.4 MIL/uL — ABNORMAL LOW (ref 3.87–5.11)
RDW: 12.1 % (ref 11.5–15.5)
WBC: 7.8 10*3/uL (ref 4.0–10.5)
nRBC: 0 % (ref 0.0–0.2)

## 2018-11-02 LAB — BASIC METABOLIC PANEL
Anion gap: 12 (ref 5–15)
BUN: 6 mg/dL (ref 6–20)
CO2: 19 mmol/L — ABNORMAL LOW (ref 22–32)
Calcium: 8.9 mg/dL (ref 8.9–10.3)
Chloride: 105 mmol/L (ref 98–111)
Creatinine, Ser: 0.43 mg/dL — ABNORMAL LOW (ref 0.44–1.00)
GFR calc Af Amer: >60 mL/min (ref >60)
GFR calc non Af Amer: >60 mL/min (ref >60)
Glucose, Bld: 71 mg/dL (ref 70–99)
Potassium: 3.7 mmol/L (ref 3.5–5.1)
Sodium: 136 mmol/L (ref 135–145)

## 2018-11-02 LAB — CBG MONITORING, ED: Glucose-Capillary: 71 mg/dL (ref 70–99)

## 2018-11-02 NOTE — ED Triage Notes (Signed)
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  Pt was standing, felt dizzy and fell.  Staff reported to pt that she fell onto someone and did not hit her head. Pt does not remember the fall.   Pt also states she is [redacted] weeks pregnant.

## 2018-11-02 NOTE — ED Notes (Signed)
Patient verbalizes understanding of discharge instructions. Opportunity for questioning and answers were provided. Armband removed by staff, pt discharged from ED.  

## 2018-11-02 NOTE — ED Provider Notes (Signed)
[facility] EMERGENCY DEPARTMENT Provider Note   CSN: [id number] Arrival date & time: 11/02/18  1209    History   Chief Complaint No chief complaint on file.   HPI Frances [name] is a 23 y.o. female.     HPI  23 year old female, she has a known history of anemia, she has a history of asthma and she has a history of being G2, P1 at approximately [redacted] weeks gestation based on her description.  An ultrasound was performed on March 12 by the obstetricians showing normal fetal growth, no evidence of fetal abnormalities.  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  She had had a piece of fruit for breakfast, while she was standing observing her new job duties she began to get a little bit lightheaded, the next thing she knew she was on the ground being helped him to a chair.  She was given some water and then brought to the emergency department.  She denies any symptoms at this time including dizziness, chest pain, cough, shortness of breath, blurred vision, vaginal bleeding, dysuria, diarrhea, swelling.  She has no other symptoms, she has not had a history of frequent syncope.   Past Medical History:  Diagnosis Date  . Anemia    on Iron supplements  . Asthma 1999   as a child; no current problems  . Chronic kidney disease    as a child  . Constipation   . Gall stones   . Seizures (HCC)    seizure disorder as a child, treated with medication; none since  . UTI (lower urinary tract infection)     Patient Active Problem List   Diagnosis Date Noted  . Supervision of low-risk pregnancy 08/18/2018  . History of asthma 08/18/2018  . History of seizure disorder 08/18/2018  . History of kidney disease as a child 08/18/2018  . Gallstones 10/23/2016  . Recurrent UTI 09/16/2016  . Rubella non-immune status, antepartum 07/20/2016    Past Surgical History:   Procedure Laterality Date  . IRRIGATION AND DEBRIDEMENT ABSCESS Left 06/11/2017   Procedure: IRRIGATION, DRAINAGE AND DEBRIDEMENT LEFT FLANK NECROTIZING SOFT TISSUE INFECTION;  Surgeon: Avel Peace, MD;  Location: [facility] ORS;  Service: General;  Laterality: Left;  . TYMPANOSTOMY TUBE PLACEMENT    . WOUND DEBRIDEMENT Left 06/12/2017   Procedure: DRESSING CHANGE AND DEBRIDEMENT OF WOUND;  Surgeon: Avel Peace, MD;  Location: [facility] ORS;  Service: General;  Laterality: Left;     OB History    Gravida  2   Para  1   Term  1   Preterm  0   AB  0   Living  1     SAB  0   TAB  0   Ectopic  0   Multiple  0   Live Births  1            Home Medications    Prior to Admission medications   Medication Sig Start Date End Date Taking? Authorizing Provider  acetaminophen (TYLENOL) 500 MG tablet Take 1 tablet (500 mg total) by mouth every 6 (six) hours as needed. 06/22/18   Georgetta Haber, NP  Doxylamine-Pyridoxine (DICLEGIS) 10-10 MG TBEC Take 2 tablets by mouth at bedtime. If symptoms persist add one tablet every morning starting on day 3. If symptoms persist add 1 tablet every afternoon on  day 4. 09/14/18   [name] D, CNM  Doxylamine-Pyridoxine 10-10 MG TBEC Take 1 tablet by mouth as directed. Take 2 tablets at bedtime. May add 1 tablet each morning starting on Day 3 if symptoms persist. May add additional tablet at lunchtime on Day 4 if symptoms persist. Patient not taking: Reported on 10/12/2018 09/14/18   Marylene Land, CNM  Prenatal Vit-Fe Fumarate-FA (PRENATAL COMPLETE) 14-0.4 MG TABS Take 1 tablet by mouth daily. 06/22/18   Georgetta Haber, NP    Family History Family History  Problem Relation Age of Onset  . Asthma Mother   . Hypertension Mother   . Asthma Sister   . Birth defects Sister        Down's syndrome  . Intellectual disability Father     Social History Social History   Tobacco Use  . Smoking status: Never Smoker  . Smokeless  tobacco: Never Used  Substance Use Topics  . Alcohol use: No    Comment: occ  . Drug use: Yes    Frequency: 2.0 times per week    Types: Marijuana    Comment: last used 05/2018     Allergies   Vancomycin   Review of Systems Review of Systems  All other systems reviewed and are negative.    Physical Exam Updated Vital Signs BP 113/82 (BP Location: Right Arm)   Pulse 89   Temp 98.4 F (36.9 C) (Oral)   Resp 16   LMP 05/26/2018 (Exact Date)   SpO2 100%   Physical Exam Vitals signs and nursing note reviewed.  Constitutional:      General: She is not in acute distress.    Appearance: She is well-developed.  HENT:     Head: Normocephalic and atraumatic.     Mouth/Throat:     Pharynx: No oropharyngeal exudate.  Eyes:     General: No scleral icterus.       Right eye: No discharge.        Left eye: No discharge.     Conjunctiva/sclera: Conjunctivae normal.     Pupils: Pupils are equal, round, and reactive to light.  Neck:     Musculoskeletal: Normal range of motion and neck supple.     Thyroid: No thyromegaly.     Vascular: No JVD.  Cardiovascular:     Rate and Rhythm: Normal rate and regular rhythm.     Heart sounds: Normal heart sounds. No murmur. No friction rub. No gallop.   Pulmonary:     Effort: Pulmonary effort is normal. No respiratory distress.     Breath sounds: Normal breath sounds. No wheezing or rales.  Abdominal:     General: Bowel sounds are normal. There is no distension.     Palpations: Abdomen is soft. There is no mass.     Tenderness: There is no abdominal tenderness.     Comments: Uterine height approximate at the umbilicus  Musculoskeletal: Normal range of motion.        General: No tenderness.  Lymphadenopathy:     Cervical: No cervical adenopathy.  Skin:    General: Skin is warm and dry.     Findings: No erythema or rash.  Neurological:     Mental Status: She is alert.     Coordination: Coordination normal.  Psychiatric:         Behavior: Behavior normal.      ED Treatments / Results  Labs (all labs ordered are listed, but only abnormal results are displayed) Labs  Reviewed  CBC WITH DIFFERENTIAL/PLATELET  BASIC METABOLIC PANEL  CBG MONITORING, ED    EKG EKG Interpretation  Date/Time:  Monday November 02 2018 12:20:33 EDT Ventricular Rate:  92 PR Interval:    QRS Duration: 81 QT Interval:  345 QTC Calculation: 427 R Axis:   30 Text Interpretation:  Sinus rhythm Normal ECG Confirmed by Eber Hong ([id number]) on 11/02/2018 1:18:03 PM   Radiology No results found.  Procedures Procedures (including critical care time)  Medications Ordered in ED Medications - No data to display   Initial Impression / Assessment and Plan / ED Course  I have reviewed the triage vital signs and the nursing notes.  Pertinent labs & imaging results that were available during my care of the patient were reviewed by me and considered in my medical decision making (see chart for details).  Clinical Course as of Nov 02 1315  Mon Nov 02, 2018  1316 Blood sugar 71, the patient has been eating, white blood cell count normal, hemoglobin 10.4, metabolic panel totally normal as are her vital signs and EKG.  The patient is stable for discharge.   [BM]    Clinical Course User Index [BM] Eber Hong, MD      The patient is a well-appearing, she does not have any signs of significant findings.  Her EKG shows no signs of arrhythmia including WPW or arrhythmia or ischemia.  I suspect that she had some orthostatic changes, we will check orthostatics, check her CBC since she has a history of anemia and a CBG.  She has not had any urinary symptoms and is otherwise appearing well.  I suspect this was more situational  Final Clinical Impressions(s) / ED Diagnoses   Final diagnoses:  Vasovagal syncope      Eber Hong, MD 11/02/18 1318

## 2018-11-02 NOTE — Discharge Instructions (Signed)
Please stay home today, drink plenty of fluids, please take normal meals, seek medical exam for severe or worsening symptoms.  Make sure that before you go to work in the morning you eat an adequate breakfast  Emergency department for severe or worsening symptoms

## 2018-11-03 ENCOUNTER — Encounter: Payer: Self-pay | Admitting: *Deleted

## 2018-11-09 ENCOUNTER — Other Ambulatory Visit: Payer: Self-pay

## 2018-11-09 ENCOUNTER — Ambulatory Visit (INDEPENDENT_AMBULATORY_CARE_PROVIDER_SITE_OTHER): Payer: Medicaid Other | Admitting: Student

## 2018-11-09 DIAGNOSIS — Z3A23 23 weeks gestation of pregnancy: Secondary | ICD-10-CM | POA: Diagnosis not present

## 2018-11-09 DIAGNOSIS — Z3490 Encounter for supervision of normal pregnancy, unspecified, unspecified trimester: Secondary | ICD-10-CM

## 2018-11-09 DIAGNOSIS — Z3492 Encounter for supervision of normal pregnancy, unspecified, second trimester: Secondary | ICD-10-CM

## 2018-11-09 NOTE — Progress Notes (Signed)
   TELEHEALTH VIRTUAL OBSTETRICS VISIT ENCOUNTER NOTE  I connected with Frances [name] on 11/09/18 at  3:35 PM EDT by telephone at home and verified that I am speaking with the correct person using two identifiers.   I discussed the limitations, risks, security and privacy concerns of performing an evaluation and management service by telephone and the availability of in person appointments. I also discussed with the patient that there may be a patient responsible charge related to this service. The patient expressed understanding and agreed to proceed.  Subjective:  Frances [name] is a 23 y.o. G2P1001 at [redacted]w[redacted]d being followed for ongoing prenatal care.  She is currently monitored for the following issues for this low-risk pregnancy and has Rubella non-immune status, antepartum; Recurrent UTI; Gallstones; Supervision of low-risk pregnancy; History of asthma; History of seizure disorder; and History of kidney disease as a child on their problem list.  Patient reports no complaints. Reports fetal movement. Denies any contractions, bleeding or leaking of fluid.   The following portions of the patient's history were reviewed and updated as appropriate: allergies, current medications, past family history, past medical history, past social history, past surgical history and problem list.   Objective:   General:  Alert, oriented and cooperative.   Mental Status: Normal mood and affect perceived. Normal judgment and thought content.  Rest of physical exam deferred due to type of encounter  Assessment and Plan:  Pregnancy: G2P1001 at [redacted]w[redacted]d 1. Encounter for supervision of low-risk pregnancy, antepartum -pt doing well with no complaints -reviewed recent labs, AFP negative -Pt needs TOC urine culture. Denies any urinary symptoms. Will get urine culture at next visit -dicussed upcoming visit in 4 wks for 28 wk labs - [facility] AMB BABYSCRIPTS SCHEDULE OPTIMIZATION  Preterm labor symptoms and general  obstetric precautions including but not limited to vaginal bleeding, contractions, leaking of fluid and fetal movement were reviewed in detail with the patient.  I discussed the assessment and treatment plan with the patient. The patient was provided an opportunity to ask questions and all were answered. The patient agreed with the plan and demonstrated an understanding of the instructions. The patient was advised to call back or seek an in-person office evaluation/go to [facility] at [facility] for any urgent or concerning symptoms. Please refer to After Visit Summary for other counseling recommendations.   I provided 5 minutes of non-face-to-face time during this encounter.  No follow-ups on file.  Future Appointments  Date Time Provider Department Center  11/26/2018  1:30 PM [facility] NURSE [facility]  11/26/2018  1:30 PM [facility] [place] 1 [facility]-M[facility] M[facility]-US    Judeth Horn, NP Center for [employer], [facility]

## 2018-11-09 NOTE — Progress Notes (Signed)
I connected with  Frances [name] on 11/09/18 at  3:35 PM EDT by telephone and verified that I am speaking with the correct person using two identifiers.   I discussed the limitations, risks, security and privacy concerns of performing an evaluation and management service by telephone and the availability of in person appointments. I also discussed with the patient that there may be a patient responsible charge related to this service. The patient expressed understanding and agreed to proceed.  Ernestina Patches, CMA 11/09/2018  3:43 PM

## 2018-11-09 NOTE — Patient Instructions (Signed)
Contraception Choices  Contraception, also called birth control, refers to methods or devices that prevent pregnancy.  Hormonal methods  Contraceptive implant    A contraceptive implant is a thin, plastic tube that contains a hormone. It is inserted into the upper part of the arm. It can remain in place for up to 3 years.  Progestin-only injections  Progestin-only injections are injections of progestin, a synthetic form of the hormone progesterone. They are given every 3 months by a health care provider.  Birth control pills    Birth control pills are pills that contain hormones that prevent pregnancy. They must be taken once a day, preferably at the same time each day.  Birth control patch    The birth control patch contains hormones that prevent pregnancy. It is placed on the skin and must be changed once a week for three weeks and removed on the fourth week. A prescription is needed to use this method of contraception.  Vaginal ring    A vaginal ring contains hormones that prevent pregnancy. It is placed in the vagina for three weeks and removed on the fourth week. After that, the process is repeated with a new ring. A prescription is needed to use this method of contraception.  Emergency contraceptive  Emergency contraceptives prevent pregnancy after unprotected sex. They come in pill form and can be taken up to 5 days after sex. They work best the sooner they are taken after having sex. Most emergency contraceptives are available without a prescription. This method should not be used as your only form of birth control.  Barrier methods  Female condom    A female condom is a thin sheath that is worn over the penis during sex. Condoms keep sperm from going inside a woman's body. They can be used with a spermicide to increase their effectiveness. They should be disposed after a single use.  Female condom    A female condom is a soft, loose-fitting sheath that is put into the vagina before sex. The condom keeps sperm  from going inside a woman's body. They should be disposed after a single use.  Diaphragm    A diaphragm is a soft, dome-shaped barrier. It is inserted into the vagina before sex, along with a spermicide. The diaphragm blocks sperm from entering the uterus, and the spermicide kills sperm. A diaphragm should be left in the vagina for 6-8 hours after sex and removed within 24 hours.  A diaphragm is prescribed and fitted by a health care provider. A diaphragm should be replaced every 1-2 years, after giving birth, after gaining more than 15 lb (6.8 kg), and after pelvic surgery.  Cervical cap    A cervical cap is a round, soft latex or plastic cup that fits over the cervix. It is inserted into the vagina before sex, along with spermicide. It blocks sperm from entering the uterus. The cap should be left in place for 6-8 hours after sex and removed within 48 hours. A cervical cap must be prescribed and fitted by a health care provider. It should be replaced every 2 years.  Sponge    A sponge is a soft, circular piece of polyurethane foam with spermicide on it. The sponge helps block sperm from entering the uterus, and the spermicide kills sperm. To use it, you make it wet and then insert it into the vagina. It should be inserted before sex, left in for at least 6 hours after sex, and removed and thrown away within   30 hours.  Spermicides  Spermicides are chemicals that kill or block sperm from entering the cervix and uterus. They can come as a cream, jelly, suppository, foam, or tablet. A spermicide should be inserted into the vagina with an applicator at least 10-15 minutes before sex to allow time for it to work. The process must be repeated every time you have sex. Spermicides do not require a prescription.  Intrauterine contraception  Intrauterine device (IUD)  An IUD is a T-shaped device that is put in a woman's uterus. There are two types:   Hormone IUD.This type contains progestin, a synthetic form of the hormone  progesterone. This type can stay in place for 3-5 years.   Copper IUD.This type is wrapped in copper wire. It can stay in place for 10 years.    Permanent methods of contraception  Female tubal ligation  In this method, a woman's fallopian tubes are sealed, tied, or blocked during surgery to prevent eggs from traveling to the uterus.  Hysteroscopic sterilization  In this method, a small, flexible insert is placed into each fallopian tube. The inserts cause scar tissue to form in the fallopian tubes and block them, so sperm cannot reach an egg. The procedure takes about 3 months to be effective. Another form of birth control must be used during those 3 months.  Female sterilization  This is a procedure to tie off the tubes that carry sperm (vasectomy). After the procedure, the man can still ejaculate fluid (semen).  Natural planning methods  Natural family planning  In this method, a couple does not have sex on days when the woman could become pregnant.  Calendar method  This means keeping track of the length of each menstrual cycle, identifying the days when pregnancy can happen, and not having sex on those days.  Ovulation method  In this method, a couple avoids sex during ovulation.  Symptothermal method  This method involves not having sex during ovulation. The woman typically checks for ovulation by watching changes in her temperature and in the consistency of cervical mucus.  Post-ovulation method  In this method, a couple waits to have sex until after ovulation.  Summary   Contraception, also called birth control, means methods or devices that prevent pregnancy.   Hormonal methods of contraception include implants, injections, pills, patches, vaginal rings, and emergency contraceptives.   Barrier methods of contraception can include female condoms, female condoms, diaphragms, cervical caps, sponges, and spermicides.   There are two types of IUDs (intrauterine devices). An IUD can be put in a woman's uterus to  prevent pregnancy for 3-5 years.   Permanent sterilization can be done through a procedure for males, females, or both.   Natural family planning methods involve not having sex on days when the woman could become pregnant.  This information is not intended to replace advice given to you by your health care provider. Make sure you discuss any questions you have with your health care provider.  Document Released: 07/29/2005 Document Revised: 07/31/2017 Document Reviewed: 08/31/2016  Elsevier Interactive Patient Education  2019 [employer]

## 2018-11-24 ENCOUNTER — Encounter (HOSPITAL_COMMUNITY): Payer: Self-pay

## 2018-11-24 ENCOUNTER — Inpatient Hospital Stay (HOSPITAL_COMMUNITY)
Admission: AD | Admit: 2018-11-24 | Discharge: 2018-11-25 | Disposition: A | Discharge status: Home / Self Care | Payer: Medicaid Other | Source: Ambulatory Visit | Attending: Family Medicine | Admitting: Family Medicine

## 2018-11-24 ENCOUNTER — Other Ambulatory Visit: Payer: Self-pay

## 2018-11-24 DIAGNOSIS — Z881 Allergy status to other antibiotic agents status: Secondary | ICD-10-CM | POA: Insufficient documentation

## 2018-11-24 DIAGNOSIS — Z3A26 26 weeks gestation of pregnancy: Secondary | ICD-10-CM | POA: Insufficient documentation

## 2018-11-24 DIAGNOSIS — K219 Gastro-esophageal reflux disease without esophagitis: Secondary | ICD-10-CM | POA: Diagnosis not present

## 2018-11-24 DIAGNOSIS — M549 Dorsalgia, unspecified: Secondary | ICD-10-CM | POA: Insufficient documentation

## 2018-11-24 DIAGNOSIS — D649 Anemia, unspecified: Secondary | ICD-10-CM | POA: Insufficient documentation

## 2018-11-24 DIAGNOSIS — O99612 Diseases of the digestive system complicating pregnancy, second trimester: Secondary | ICD-10-CM

## 2018-11-24 DIAGNOSIS — O99012 Anemia complicating pregnancy, second trimester: Secondary | ICD-10-CM | POA: Insufficient documentation

## 2018-11-24 DIAGNOSIS — O9989 Other specified diseases and conditions complicating pregnancy, childbirth and the puerperium: Secondary | ICD-10-CM | POA: Insufficient documentation

## 2018-11-24 MED ORDER — ALUM & MAG HYDROXIDE-SIMETH 200-200-20 MG/5ML PO SUSP
30.0000 mL | Freq: Once | ORAL | Status: AC
  Administered 2018-11-25: 30 mL via ORAL
  Filled 2018-11-24: qty 30

## 2018-11-24 MED ORDER — LIDOCAINE VISCOUS HCL 2 % MT SOLN
15.0000 mL | Freq: Once | OROMUCOSAL | Status: AC
  Administered 2018-11-25: 15 mL via ORAL
  Filled 2018-11-24: qty 15

## 2018-11-24 NOTE — MAU Provider Note (Signed)
Chief Complaint:  Back Pain   First Provider Initiated Contact with Patient 11/24/18 2334     HPI: Frances [name] is a 23 y.o. G2P1001 at [redacted]w[redacted]d who presents to maternity admissions reporting chest pain. Symptoms began last night & continued intermittently today. Reports substernal pain that radiates to her back between her shoulder blades. Nothing makes pain better or worse. Denies fever/chills, sore throat, cough, or SOB. Denies n/v. Ate spaghetti & cereal today. Has not treated symptoms. Reports history of gallstones during her previous pregnancy in 2018; states she is supposed to have her gall bladder removed after this pregnancy. Denies contractions, leakage of fluid or vaginal bleeding. Good fetal movement.  Location: chest Quality: dull, shooting Severity: 8/10 in pain scale Duration: 1 day Timing: intermittent Modifying factors: none Associated signs and symptoms: none  Past Medical History:  Diagnosis Date  . Anemia    on Iron supplements  . Asthma 1999   as a child; no current problems  . Chronic kidney disease    as a child  . Constipation   . Gall stones   . Seizures (HCC)    seizure disorder as a child, treated with medication; none since  . UTI (lower urinary tract infection)    OB History  Gravida Para Term Preterm AB Living  2 1 1  0 0 1  SAB TAB Ectopic Multiple Live Births  0 0 0 0 1    # Outcome Date GA Lbr Len/2nd Weight Sex Delivery Anes PTL Lv  2 Current           1 Term 02/28/17 [redacted]w[redacted]d 18:20 / 00:27 3680 g F Vag-Spont EPI  LIV     Birth Comments: WNL   Past Surgical History:  Procedure Laterality Date  . IRRIGATION AND DEBRIDEMENT ABSCESS Left 06/11/2017   Procedure: IRRIGATION, DRAINAGE AND DEBRIDEMENT LEFT FLANK NECROTIZING SOFT TISSUE INFECTION;  Surgeon: Avel Peace, MD;  Location: [facility];  Service: General;  Laterality: Left;  . TYMPANOSTOMY TUBE PLACEMENT    . WOUND DEBRIDEMENT Left 06/12/2017   Procedure: DRESSING CHANGE AND DEBRIDEMENT  OF WOUND;  Surgeon: Avel Peace, MD;  Location: [facility];  Service: General;  Laterality: Left;   Family History  Problem Relation Age of Onset  . Asthma Mother   . Hypertension Mother   . Asthma Sister   . Birth defects Sister        Down's syndrome  . Intellectual disability Father    Social History   Tobacco Use  . Smoking status: Never Smoker  . Smokeless tobacco: Never Used  Substance Use Topics  . Alcohol use: No  . Drug use: Yes    Frequency: 2.0 times per week    Types: Marijuana    Comment: last used 05/2018   Allergies  Allergen Reactions  . Vancomycin     Nephrotoxicity   No medications prior to admission.    I have reviewed patient's Past Medical Hx, Surgical Hx, Family Hx, Social Hx, medications and allergies.   ROS:  Review of Systems  Constitutional: Negative.   Respiratory: Negative for chest tightness, shortness of breath and wheezing.   Cardiovascular: Positive for chest pain. Negative for palpitations and leg swelling.  Gastrointestinal: Negative.   Genitourinary: Negative.   Neurological: Negative.     Physical Exam   Patient Vitals for the past 24 hrs:  BP Temp Pulse Resp Height Weight  11/25/18 0137 109/60 - 91 19 - -  11/24/18 2325 (!) 101/58 98.6 F (37  C) 95 19 - -  11/24/18 2316 - - - -  (1.702 m) 78.3 kg    Constitutional: Well-developed, well-nourished female in no acute distress.  Cardiovascular: normal rate & rhythm, no murmur Respiratory: normal effort, lung sounds clear throughout GI: Abd soft, non-tender, gravid appropriate for gestational age. Pos BS x 4 MS: Extremities nontender, no edema, normal ROM Neurologic: Alert and oriented x 4.   NST:  Baseline: 145 bpm, Variability: Good {> 6 bpm), Accelerations: Non-reactive but appropriate for gestational age and Decelerations: Absent   Labs: Results for orders placed or performed during the hospital encounter of 11/24/18 (from the past 24 hour(s))  CBC     Status:  Abnormal   Collection Time: 11/25/18 12:08 AM  Result Value Ref Range   WBC 7.5 4.0 - 10.5 K/uL   RBC 3.01 (L) 3.87 - 5.11 MIL/uL   Hemoglobin 9.2 (L) 12.0 - 15.0 g/dL   HCT 16.1 (L) 09.6 - 04.5 %   MCV 90.0 80.0 - 100.0 fL   MCH 30.6 26.0 - 34.0 pg   MCHC 33.9 30.0 - 36.0 g/dL   RDW 40.9 81.1 - 91.4 %   Platelets 246 150 - 400 K/uL   nRBC 0.0 0.0 - 0.2 %  Comprehensive metabolic panel     Status: Abnormal   Collection Time: 11/25/18 12:08 AM  Result Value Ref Range   Sodium 135 135 - 145 mmol/L   Potassium 3.7 3.5 - 5.1 mmol/L   Chloride 104 98 - 111 mmol/L   CO2 20 (L) 22 - 32 mmol/L   Glucose, Bld 88 70 - 99 mg/dL   BUN 7 6 - 20 mg/dL   Creatinine, Ser 7.82 (L) 0.44 - 1.00 mg/dL   Calcium 9.1 8.9 - 95.6 mg/dL   Total Protein 5.7 (L) 6.5 - 8.1 g/dL   Albumin 2.8 (L) 3.5 - 5.0 g/dL   AST 16 15 - 41 U/L   ALT 12 0 - 44 U/L   Alkaline Phosphatase 60 38 - 126 U/L   Total Bilirubin 0.4 0.3 - 1.2 mg/dL   GFR calc non Af Amer >60 >60 mL/min   GFR calc Af Amer >60 >60 mL/min   Anion gap 11 5 - 15  Lipase, blood     Status: None   Collection Time: 11/25/18 12:08 AM  Result Value Ref Range   Lipase 33 11 - 51 U/L  Troponin I - ONCE - STAT     Status: None   Collection Time: 11/25/18 12:08 AM  Result Value Ref Range   Troponin I <0.03 <0.03 ng/mL    Imaging:  No results found.  MAU Course: Orders Placed This Encounter  Procedures  . CBC  . Comprehensive metabolic panel  . Lipase, blood  . Troponin I - ONCE - STAT  . ED EKG  . Discharge patient   Meds ordered this encounter  Medications  . AND Linked Order Group   . alum & mag hydroxide-simeth (MAALOX/MYLANTA) 200-200-20 MG/5ML suspension 30 mL   . lidocaine (XYLOCAINE) 2 % viscous mouth solution 15 mL  . famotidine (PEPCID) 20 MG tablet    Sig: Take 1 tablet (20 mg total) by mouth 2 (two) times daily for 30 days.    Dispense:  60 tablet    Refill:  0    Order Specific Question:   Supervising Provider     Answer:   Levie Heritage [[id number]]    MDM: Vital signs stable. Denies SOB  or any respiratory symptoms.  CBC, CMP, lipase, & troponin ordered. Labs normal EKG NSR Pt given GI cocktail with complete resolution of pain.   Assessment: 1. Gastroesophageal reflux disease without esophagitis   2. [redacted] weeks gestation of pregnancy     Plan: Discharge home in stable condition.  Preterm Labor precautions and fetal kick counts Reports to ED for return of chest pain or if any respiratory symptoms Rx pepcid   Allergies as of 11/25/2018      Reactions   Vancomycin    Nephrotoxicity      Medication List    STOP taking these medications   acetaminophen 500 MG tablet Commonly known as:  TYLENOL   Doxylamine-Pyridoxine 10-10 MG Tbec     TAKE these medications   famotidine 20 MG tablet Commonly known as:  PEPCID Take 1 tablet (20 mg total) by mouth 2 (two) times daily for 30 days.   Prenatal Complete 14-0.4 MG Tabs Take 1 tablet by mouth daily.       Judeth HornLawrence, Trinidi Toppins, NP 11/25/2018 2:22 AM

## 2018-11-24 NOTE — MAU Note (Signed)
Upper mid back pain that radiates to her chest. Reports the pain started yesterday.  No SOB/difficulty breathing.  No hx of any cardiac issues.  Not feeling any ctx.  No LOF/VB.  + FM.

## 2018-11-25 DIAGNOSIS — K219 Gastro-esophageal reflux disease without esophagitis: Secondary | ICD-10-CM | POA: Diagnosis not present

## 2018-11-25 DIAGNOSIS — O99612 Diseases of the digestive system complicating pregnancy, second trimester: Secondary | ICD-10-CM | POA: Diagnosis not present

## 2018-11-25 DIAGNOSIS — M549 Dorsalgia, unspecified: Secondary | ICD-10-CM | POA: Diagnosis not present

## 2018-11-25 DIAGNOSIS — O99012 Anemia complicating pregnancy, second trimester: Secondary | ICD-10-CM | POA: Diagnosis not present

## 2018-11-25 DIAGNOSIS — Z881 Allergy status to other antibiotic agents status: Secondary | ICD-10-CM | POA: Diagnosis not present

## 2018-11-25 DIAGNOSIS — Z3A26 26 weeks gestation of pregnancy: Secondary | ICD-10-CM | POA: Diagnosis not present

## 2018-11-25 DIAGNOSIS — O9989 Other specified diseases and conditions complicating pregnancy, childbirth and the puerperium: Secondary | ICD-10-CM | POA: Diagnosis not present

## 2018-11-25 DIAGNOSIS — D649 Anemia, unspecified: Secondary | ICD-10-CM | POA: Diagnosis not present

## 2018-11-25 LAB — CBC
HCT: 27.1 % — ABNORMAL LOW (ref 36.0–46.0)
Hemoglobin: 9.2 g/dL — ABNORMAL LOW (ref 12.0–15.0)
MCH: 30.6 pg (ref 26.0–34.0)
MCHC: 33.9 g/dL (ref 30.0–36.0)
MCV: 90 fL (ref 80.0–100.0)
Platelets: 246 10*3/uL (ref 150–400)
RBC: 3.01 MIL/uL — ABNORMAL LOW (ref 3.87–5.11)
RDW: 11.7 % (ref 11.5–15.5)
WBC: 7.5 10*3/uL (ref 4.0–10.5)
nRBC: 0 % (ref 0.0–0.2)

## 2018-11-25 LAB — COMPREHENSIVE METABOLIC PANEL
ALT: 12 U/L (ref 0–44)
AST: 16 U/L (ref 15–41)
Albumin: 2.8 g/dL — ABNORMAL LOW (ref 3.5–5.0)
Alkaline Phosphatase: 60 U/L (ref 38–126)
Anion gap: 11 (ref 5–15)
BUN: 7 mg/dL (ref 6–20)
CO2: 20 mmol/L — ABNORMAL LOW (ref 22–32)
Calcium: 9.1 mg/dL (ref 8.9–10.3)
Chloride: 104 mmol/L (ref 98–111)
Creatinine, Ser: 0.43 mg/dL — ABNORMAL LOW (ref 0.44–1.00)
GFR calc Af Amer: >60 mL/min (ref >60)
GFR calc non Af Amer: >60 mL/min (ref >60)
Glucose, Bld: 88 mg/dL (ref 70–99)
Potassium: 3.7 mmol/L (ref 3.5–5.1)
Sodium: 135 mmol/L (ref 135–145)
Total Bilirubin: 0.4 mg/dL (ref 0.3–1.2)
Total Protein: 5.7 g/dL — ABNORMAL LOW (ref 6.5–8.1)

## 2018-11-25 LAB — TROPONIN I: Troponin I: <0.03 ng/mL (ref <0.03)

## 2018-11-25 LAB — LIPASE, BLOOD: Lipase: 33 U/L (ref 11–51)

## 2018-11-25 MED ORDER — FAMOTIDINE 20 MG PO TABS: 20.0000 mg | ORAL_TABLET | Freq: Two times a day (BID) | ORAL | 0 refills | Status: DC

## 2018-11-25 NOTE — Discharge Instructions (Signed)

## 2018-11-26 ENCOUNTER — Other Ambulatory Visit: Payer: Self-pay

## 2018-11-26 ENCOUNTER — Ambulatory Visit (HOSPITAL_COMMUNITY)
Admission: RE | Admit: 2018-11-26 | Discharge: 2018-11-26 | Disposition: A | Discharge status: Home / Self Care | Payer: Medicaid Other | Source: Ambulatory Visit | Attending: Obstetrics and Gynecology | Admitting: Obstetrics and Gynecology

## 2018-11-26 ENCOUNTER — Encounter (HOSPITAL_COMMUNITY): Payer: Self-pay

## 2018-11-26 ENCOUNTER — Ambulatory Visit (HOSPITAL_COMMUNITY): Payer: Medicaid Other | Admitting: *Deleted

## 2018-11-26 VITALS — BP 101/51 | HR 116 | Temp 98.6°F

## 2018-11-26 DIAGNOSIS — Z8669 Personal history of other diseases of the nervous system and sense organs: Secondary | ICD-10-CM | POA: Insufficient documentation

## 2018-11-26 DIAGNOSIS — Z87441 Personal history of nephrotic syndrome: Secondary | ICD-10-CM

## 2018-11-26 DIAGNOSIS — Z8709 Personal history of other diseases of the respiratory system: Secondary | ICD-10-CM

## 2018-11-26 DIAGNOSIS — Z362 Encounter for other antenatal screening follow-up: Secondary | ICD-10-CM

## 2018-11-26 DIAGNOSIS — Z3A26 26 weeks gestation of pregnancy: Secondary | ICD-10-CM

## 2018-11-26 DIAGNOSIS — Z87448 Personal history of other diseases of urinary system: Secondary | ICD-10-CM

## 2018-11-27 ENCOUNTER — Telehealth: Payer: Self-pay | Admitting: General Practice

## 2018-11-27 NOTE — Telephone Encounter (Signed)
Per review, patient has not entered any BP into babyscripts. Called patient and she states she hasn't came by the office to pick up a BP cuff yet. Encouraged patient to do so as soon as possible as we are starting to run low on BP cuffs. Patient verbalized understanding & states she will try to come by today or Monday. Patient had no questions.

## 2018-12-08 ENCOUNTER — Other Ambulatory Visit: Payer: Medicaid Other

## 2018-12-08 ENCOUNTER — Other Ambulatory Visit: Payer: Self-pay | Admitting: General Practice

## 2018-12-08 ENCOUNTER — Telehealth: Payer: Self-pay | Admitting: Student

## 2018-12-08 ENCOUNTER — Encounter: Payer: Medicaid Other | Admitting: Obstetrics & Gynecology

## 2018-12-08 DIAGNOSIS — Z3493 Encounter for supervision of normal pregnancy, unspecified, third trimester: Secondary | ICD-10-CM

## 2018-12-08 NOTE — Telephone Encounter (Signed)
Patient called to reschedule the missed appointment. Informed of the upcoming appointment.

## 2018-12-09 ENCOUNTER — Telehealth: Payer: Self-pay | Admitting: *Deleted

## 2018-12-09 NOTE — Telephone Encounter (Signed)
Called pt to discuss babyscripts and ensure that she is able to use the app and enter her blood pressure.  Pt did not pick up.  Left a message informing pt that she was being contacted in regards to babyscripts and requesting that she take and log a blood pressure into the app.  Advised pt to call the clinic if she has difficulty with this. MyChart message sent.  

## 2018-12-16 ENCOUNTER — Telehealth: Payer: Self-pay | Admitting: Obstetrics & Gynecology

## 2018-12-16 NOTE — Telephone Encounter (Signed)
Called the patient to inform of upcoming appointment, left a detailed voicemail °

## 2018-12-17 ENCOUNTER — Other Ambulatory Visit: Payer: Medicaid Other

## 2018-12-17 ENCOUNTER — Encounter: Payer: Medicaid Other | Admitting: Obstetrics and Gynecology

## 2019-02-02 ENCOUNTER — Other Ambulatory Visit: Payer: Self-pay

## 2019-02-02 ENCOUNTER — Ambulatory Visit (INDEPENDENT_AMBULATORY_CARE_PROVIDER_SITE_OTHER): Payer: Medicaid Other | Admitting: Medical

## 2019-02-02 ENCOUNTER — Other Ambulatory Visit (HOSPITAL_COMMUNITY)
Admission: RE | Admit: 2019-02-02 | Discharge: 2019-02-02 | Disposition: A | Discharge status: Home / Self Care | Payer: Medicaid Other | Source: Ambulatory Visit | Attending: Medical | Admitting: Medical

## 2019-02-02 VITALS — BP 115/59 | HR 94 | Wt 184.0 lb

## 2019-02-02 DIAGNOSIS — Z3A36 36 weeks gestation of pregnancy: Secondary | ICD-10-CM

## 2019-02-02 DIAGNOSIS — O09899 Supervision of other high risk pregnancies, unspecified trimester: Secondary | ICD-10-CM

## 2019-02-02 DIAGNOSIS — O0933 Supervision of pregnancy with insufficient antenatal care, third trimester: Secondary | ICD-10-CM

## 2019-02-02 DIAGNOSIS — Z283 Underimmunization status: Secondary | ICD-10-CM

## 2019-02-02 DIAGNOSIS — Z23 Encounter for immunization: Secondary | ICD-10-CM

## 2019-02-02 DIAGNOSIS — Z87448 Personal history of other diseases of urinary system: Secondary | ICD-10-CM

## 2019-02-02 DIAGNOSIS — Z3493 Encounter for supervision of normal pregnancy, unspecified, third trimester: Secondary | ICD-10-CM | POA: Diagnosis not present

## 2019-02-02 DIAGNOSIS — O9989 Other specified diseases and conditions complicating pregnancy, childbirth and the puerperium: Secondary | ICD-10-CM

## 2019-02-02 DIAGNOSIS — Z87441 Personal history of nephrotic syndrome: Secondary | ICD-10-CM

## 2019-02-02 NOTE — Progress Notes (Signed)
   PRENATAL VISIT NOTE  Subjective:  MALEIGH BAGOT is a 23 y.o. G2P1001 at 84w0dbeing seen today for ongoing prenatal care.  She is currently monitored for the following issues for this low-risk pregnancy and has Rubella non-immune status, antepartum; Recurrent UTI; Gallstones; Supervision of low-risk pregnancy; History of asthma; History of seizure disorder; and History of kidney disease as a child on their problem list.  Patient reports no complaints.  Contractions: Not present. Vag. Bleeding: None.  Movement: Present. Denies leaking of fluid.   The following portions of the patient's history were reviewed and updated as appropriate: allergies, current medications, past family history, past medical history, past social history, past surgical history and problem list.   Objective:   Vitals:   02/02/19 1545  BP: (!) 115/59  Pulse: 94  Weight: 184 lb (83.5 kg)    Fetal Status: Fetal Heart Rate (bpm): 150 Fundal Height: 35 cm Movement: Present     General:  Alert, oriented and cooperative. Patient is in no acute distress.  Skin: Skin is warm and dry. No rash noted.   Cardiovascular: Normal heart rate noted  Respiratory: Normal respiratory effort, no problems with respiration noted  Abdomen: Soft, gravid, appropriate for gestational age.  Pain/Pressure: Present     Pelvic: Cervical exam performed Dilation: Closed Effacement (%): Thick Station: Ballotable  Extremities: Normal range of motion.  Edema: None  Mental Status: Normal mood and affect. Normal behavior. Normal judgment and thought content.   Assessment and Plan:  Pregnancy: G2P1001 at 311w0d. Encounter for supervision of low-risk pregnancy in third trimester - RPR - HIV Antibody (routine testing w rflx) - CBC - HgB A1c - Tdap vaccine greater than or equal to 7yo IM - Cervicovaginal ancillary only( [place]) - Culture, beta strep (group b only)  2. History of kidney disease as a child  3. Rubella non-immune  status, antepartum - PP MMR  4. Prenatal care insufficient, third trimester - Patient has not been seen since 26 weeks - 28 week labs today and will schedule 2 hour GTT for later this week when fasting and time allows  Preterm labor symptoms and general obstetric precautions including but not limited to vaginal bleeding, contractions, leaking of fluid and fetal movement were reviewed in detail with the patient. Please refer to After Visit Summary for other counseling recommendations.   Return in about 1 week (around 02/09/2019) for LOB, In-Person. In person visits suggested since [facility]  JuKerry HoughPA-C

## 2019-02-02 NOTE — Patient Instructions (Addendum)

## 2019-02-02 NOTE — Progress Notes (Deleted)
   PRENATAL VISIT NOTE  Subjective:  Frances [name] is a 23 y.o. G2P1001 at [redacted]w[redacted]d being seen today for ongoing prenatal care.  She is currently monitored for the following issues for this high-risk pregnancy and has Rubella non-immune status, antepartum; Recurrent UTI; Gallstones; Supervision of low-risk pregnancy; History of asthma; History of seizure disorder; and History of kidney disease as a child on their problem list.  Patient reports {[id number]}.   .  .   . Denies leaking of fluid.   The following portions of the patient's history were reviewed and updated as appropriate: allergies, current medications, past family history, past medical history, past social history, past surgical history and problem list.   Objective:  There were no vitals filed for this visit.  Fetal Status:           General:  Alert, oriented and cooperative. Patient is in no acute distress.  Skin: Skin is warm and dry. No rash noted.   Cardiovascular: Normal heart rate noted  Respiratory: Normal respiratory effort, no problems with respiration noted  Abdomen: Soft, gravid, appropriate for gestational age.        Pelvic: Cervical exam performed        Extremities: Normal range of motion.     Mental Status: Normal mood and affect. Normal behavior. Normal judgment and thought content.   Assessment and Plan:  Pregnancy: G2P1001 at [redacted]w[redacted]d There are no diagnoses linked to this encounter. Term labor symptoms and general obstetric precautions including but not limited to vaginal bleeding, contractions, leaking of fluid and fetal movement were reviewed in detail with the patient. Please refer to After Visit Summary for other counseling recommendations.   No follow-ups on file.  Future Appointments  Date Time Provider [place]  02/02/2019  3:15 PM [place], [place], [place], [place]

## 2019-02-03 LAB — CBC
Hematocrit: 29.6 % — ABNORMAL LOW (ref 34.0–46.6)
Hemoglobin: 9.6 g/dL — ABNORMAL LOW (ref 11.1–15.9)
MCH: 27.5 pg (ref 26.6–33.0)
MCHC: 32.4 g/dL (ref 31.5–35.7)
MCV: 85 fL (ref 79–97)
Platelets: 270 10*3/uL (ref 150–450)
RBC: 3.49 x10E6/uL — ABNORMAL LOW (ref 3.77–5.28)
RDW: 12 % (ref 11.7–15.4)
WBC: 7 10*3/uL (ref 3.4–10.8)

## 2019-02-03 LAB — HEMOGLOBIN A1C
Est. average glucose Bld gHb Est-mCnc: 97 mg/dL
Hgb A1c MFr Bld: 5 % (ref 4.8–5.6)

## 2019-02-03 LAB — CERVICOVAGINAL ANCILLARY ONLY
Chlamydia: NEGATIVE
Neisseria Gonorrhea: NEGATIVE

## 2019-02-03 LAB — HIV ANTIBODY (ROUTINE TESTING W REFLEX): HIV Screen 4th Generation wRfx: NONREACTIVE

## 2019-02-03 LAB — RPR: RPR Ser Ql: NONREACTIVE

## 2019-02-04 ENCOUNTER — Telehealth: Payer: Self-pay | Admitting: Family Medicine

## 2019-02-04 ENCOUNTER — Other Ambulatory Visit: Payer: Self-pay | Admitting: General Practice

## 2019-02-04 DIAGNOSIS — Z3493 Encounter for supervision of normal pregnancy, unspecified, third trimester: Secondary | ICD-10-CM

## 2019-02-04 NOTE — Telephone Encounter (Signed)
Attempted to call patient to ask her about any symptoms, and to wear her mask the whole visit covering her nose and mouth. Also, to sanitize her hands upon arriving as well as no visitors.VM left

## 2019-02-05 ENCOUNTER — Other Ambulatory Visit: Payer: Medicaid Other

## 2019-02-06 LAB — CULTURE, BETA STREP (GROUP B ONLY): Strep Gp B Culture: NEGATIVE

## 2019-02-09 ENCOUNTER — Encounter: Payer: Self-pay | Admitting: Advanced Practice Midwife

## 2019-02-09 ENCOUNTER — Telehealth (INDEPENDENT_AMBULATORY_CARE_PROVIDER_SITE_OTHER): Payer: Medicaid Other | Admitting: Advanced Practice Midwife

## 2019-02-09 ENCOUNTER — Other Ambulatory Visit: Payer: Self-pay

## 2019-02-09 DIAGNOSIS — Z3493 Encounter for supervision of normal pregnancy, unspecified, third trimester: Secondary | ICD-10-CM

## 2019-02-09 DIAGNOSIS — Z3A37 37 weeks gestation of pregnancy: Secondary | ICD-10-CM

## 2019-02-09 NOTE — Progress Notes (Signed)
   TELEHEALTH VIRTUAL OBSTETRICS VISIT ENCOUNTER NOTE  I connected with Delbert Phenix on 02/09/19 at  3:15 PM EDT by telephone at home and verified that I am speaking with the correct person using two identifiers.   I discussed the limitations, risks, security and privacy concerns of performing an evaluation and management service by telephone and the availability of in person appointments. I also discussed with the patient that there may be a patient responsible charge related to this service. The patient expressed understanding and agreed to proceed.  Subjective:  Frances [name] is a 23 y.o. G2P1001 at [redacted]w[redacted]d being followed for ongoing prenatal care.  She is currently monitored for the following issues for this low-risk pregnancy and has Rubella non-immune status, antepartum; Recurrent UTI; Gallstones; Supervision of low-risk pregnancy; History of asthma; History of seizure disorder; and History of kidney disease as a child on their problem list.  Patient reports no complaints. Reports fetal movement. Denies any contractions, bleeding or leaking of fluid.   The following portions of the patient's history were reviewed and updated as appropriate: allergies, current medications, past family history, past medical history, past social history, past surgical history and problem list.   Objective:   General:  Alert, oriented and cooperative.   Mental Status: Normal mood and affect perceived. Normal judgment and thought content.  Rest of physical exam deferred due to type of encounter  Assessment and Plan:  Pregnancy: G2P1001 at [redacted]w[redacted]d 1. Encounter for supervision of low-risk pregnancy in third trimester    - Routine care  Term labor symptoms and general obstetric precautions including but not limited to vaginal bleeding, contractions, leaking of fluid and fetal movement were reviewed in detail with the patient.  I discussed the assessment and treatment plan with the patient. The patient  was provided an opportunity to ask questions and all were answered. The patient agreed with the plan and demonstrated an understanding of the instructions. The patient was advised to call back or seek an in-person office evaluation/go to MAU at [facility] for any urgent or concerning symptoms. Please refer to After Visit Summary for other counseling recommendations.   I provided 10 minutes of non-face-to-face time during this encounter.  Return for as scheduled .  Future Appointments  Date Time Provider [place]  02/16/2019  2:15 PM Danielle Rankin [facility] [place]  02/23/2019 11:15 AM Constant, Vickii Chafe, MD [facility]  03/03/2019  9:15 AM Emily Filbert, MD [place], CNM  02/09/19  3:57 PM  [facility] [place]

## 2019-02-15 ENCOUNTER — Telehealth: Payer: Self-pay | Admitting: Obstetrics & Gynecology

## 2019-02-15 NOTE — Telephone Encounter (Signed)
Left a voicemail message instructing the patient of how enter the virtual visit via mychart. Informed the patient of logging in 15 minutes prior to the appointment and if she has any questions please call our office at [phone]. °

## 2019-02-16 ENCOUNTER — Encounter: Payer: Self-pay | Admitting: Medical

## 2019-02-16 ENCOUNTER — Other Ambulatory Visit: Payer: Self-pay

## 2019-02-16 ENCOUNTER — Telehealth (INDEPENDENT_AMBULATORY_CARE_PROVIDER_SITE_OTHER): Payer: Medicaid Other | Admitting: Medical

## 2019-02-16 DIAGNOSIS — Z283 Underimmunization status: Secondary | ICD-10-CM | POA: Diagnosis not present

## 2019-02-16 DIAGNOSIS — Z3493 Encounter for supervision of normal pregnancy, unspecified, third trimester: Secondary | ICD-10-CM

## 2019-02-16 DIAGNOSIS — O9989 Other specified diseases and conditions complicating pregnancy, childbirth and the puerperium: Secondary | ICD-10-CM | POA: Diagnosis not present

## 2019-02-16 DIAGNOSIS — Z3A38 38 weeks gestation of pregnancy: Secondary | ICD-10-CM

## 2019-02-16 DIAGNOSIS — O0933 Supervision of pregnancy with insufficient antenatal care, third trimester: Secondary | ICD-10-CM

## 2019-02-16 DIAGNOSIS — Z8669 Personal history of other diseases of the nervous system and sense organs: Secondary | ICD-10-CM

## 2019-02-16 DIAGNOSIS — Z2839 Other underimmunization status: Secondary | ICD-10-CM

## 2019-02-16 NOTE — Patient Instructions (Signed)
Fetal Movement Counts Patient Name: ________________________________________________ Patient Due Date: ____________________ What is a fetal movement count?  A fetal movement count is the number of times that you feel your baby move during a certain amount of time. This may also be called a fetal kick count. A fetal movement count is recommended for every pregnant woman. You may be asked to start counting fetal movements as early as week 28 of your pregnancy. Pay attention to when your baby is most active. You may notice your baby's sleep and wake cycles. You may also notice things that make your baby move more. You should do a fetal movement count:  When your baby is normally most active.  At the same time each day. A good time to count movements is while you are resting, after having something to eat and drink. How do I count fetal movements? 1. Find a quiet, comfortable area. Sit, or lie down on your side. 2. Write down the date, the start time and stop time, and the number of movements that you felt between those two times. Take this information with you to your health care visits. 3. For 2 hours, count kicks, flutters, swishes, rolls, and jabs. You should feel at least 10 movements during 2 hours. 4. You may stop counting after you have felt 10 movements. 5. If you do not feel 10 movements in 2 hours, have something to eat and drink. Then, keep resting and counting for 1 hour. If you feel at least 4 movements during that hour, you may stop counting. Contact a health care provider if:  You feel fewer than 4 movements in 2 hours.  Your baby is not moving like he or she usually does. Date: ____________ Start time: ____________ Stop time: ____________ Movements: ____________ Date: ____________ Start time: ____________ Stop time: ____________ Movements: ____________ Date: ____________ Start time: ____________ Stop time: ____________ Movements: ____________ Date: ____________ Start time:  ____________ Stop time: ____________ Movements: ____________ Date: ____________ Start time: ____________ Stop time: ____________ Movements: ____________ Date: ____________ Start time: ____________ Stop time: ____________ Movements: ____________ Date: ____________ Start time: ____________ Stop time: ____________ Movements: ____________ Date: ____________ Start time: ____________ Stop time: ____________ Movements: ____________ Date: ____________ Start time: ____________ Stop time: ____________ Movements: ____________ This information is not intended to replace advice given to you by your health care provider. Make sure you discuss any questions you have with your health care provider. Document Released: 08/28/2006 Document Revised: 08/18/2018 Document Reviewed: 09/07/2015 Elsevier Patient Education  2020 [employer] Braxton Hicks Contractions Contractions of the uterus can occur throughout pregnancy, but they are not always a sign that you are in labor. You may have practice contractions called Braxton Hicks contractions. These false labor contractions are sometimes confused with true labor. What are Braxton Hicks contractions? Braxton Hicks contractions are tightening movements that occur in the muscles of the uterus before labor. Unlike true labor contractions, these contractions do not result in opening (dilation) and thinning of the cervix. Toward the end of pregnancy (32-34 weeks), Braxton Hicks contractions can happen more often and may become stronger. These contractions are sometimes difficult to tell apart from true labor because they can be very uncomfortable. You should not feel embarrassed if you go to the hospital with false labor. Sometimes, the only way to tell if you are in true labor is for your health care provider to look for changes in the cervix. The health care provider will do a physical exam and may monitor your contractions. If you   are not in true labor, the exam should show  that your cervix is not dilating and your water has not broken. If there are no other health problems associated with your pregnancy, it is completely safe for you to be sent home with false labor. You may continue to have Braxton Hicks contractions until you go into true labor. How to tell the difference between true labor and false labor True labor  Contractions last 30-70 seconds.  Contractions become very regular.  Discomfort is usually felt in the top of the uterus, and it spreads to the lower abdomen and low back.  Contractions do not go away with walking.  Contractions usually become more intense and increase in frequency.  The cervix dilates and gets thinner. False labor  Contractions are usually shorter and not as strong as true labor contractions.  Contractions are usually irregular.  Contractions are often felt in the front of the lower abdomen and in the groin.  Contractions may go away when you walk around or change positions while lying down.  Contractions get weaker and are shorter-lasting as time goes on.  The cervix usually does not dilate or become thin. Follow these instructions at home:   Take over-the-counter and prescription medicines only as told by your health care provider.  Keep up with your usual exercises and follow other instructions from your health care provider.  Eat and drink lightly if you think you are going into labor.  If Braxton Hicks contractions are making you uncomfortable: ? Change your position from lying down or resting to walking, or change from walking to resting. ? Sit and rest in a tub of warm water. ? Drink enough fluid to keep your urine pale yellow. Dehydration may cause these contractions. ? Do slow and deep breathing several times an hour.  Keep all follow-up prenatal visits as told by your health care provider. This is important. Contact a health care provider if:  You have a fever.  You have continuous pain in  your abdomen. Get help right away if:  Your contractions become stronger, more regular, and closer together.  You have fluid leaking or gushing from your vagina.  You pass blood-tinged mucus (bloody show).  You have bleeding from your vagina.  You have low back pain that you never had before.  You feel your baby's head pushing down and causing pelvic pressure.  Your baby is not moving inside you as much as it used to. Summary  Contractions that occur before labor are called Braxton Hicks contractions, false labor, or practice contractions.  Braxton Hicks contractions are usually shorter, weaker, farther apart, and less regular than true labor contractions. True labor contractions usually become progressively stronger and regular, and they become more frequent.  Manage discomfort from Braxton Hicks contractions by changing position, resting in a warm bath, drinking plenty of water, or practicing deep breathing. This information is not intended to replace advice given to you by your health care provider. Make sure you discuss any questions you have with your health care provider. Document Released: 12/12/2016 Document Revised: 07/11/2017 Document Reviewed: 12/12/2016 Elsevier Patient Education  2020 [employer] Inc.  

## 2019-02-16 NOTE — Progress Notes (Signed)
I connected with Frances [name] on 02/16/19 at  2:15 PM EDT by: Doximity Video Call and verified that I am speaking with the correct person using two identifiers.  Patient is located at home and provider is located at [facility].     The purpose of this virtual visit is to provide medical care while limiting exposure to the novel coronavirus. I discussed the limitations, risks, security and privacy concerns of performing an evaluation and management service by Doximity Video Call and the availability of in person appointments. I also discussed with the patient that there may be a patient responsible charge related to this service. By engaging in this virtual visit, you consent to the provision of healthcare.  Additionally, you authorize for your insurance to be billed for the services provided during this visit.  The patient expressed understanding and agreed to proceed.  The following staff members participated in the virtual visit:  Carver Fila, [place] VISIT NOTE  Subjective:  Frances [name] is a 23 y.o. G2P1001 at [redacted]w[redacted]d  for phone visit for ongoing prenatal care.  She is currently monitored for the following issues for this low-risk pregnancy and has Rubella non-immune status, antepartum; Recurrent UTI; Gallstones; Supervision of low-risk pregnancy; History of asthma; History of seizure disorder; and History of kidney disease as a child on their problem list.  Patient reports no complaints.  Contractions: Not present. Vag. Bleeding: None.  Movement: Present. Denies leaking of fluid.   The following portions of the patient's history were reviewed and updated as appropriate: allergies, current medications, past family history, past medical history, past social history, past surgical history and problem list.   Objective:  There were no vitals filed for this visit. Self-Obtained  Fetal Status:     Movement: Present     Assessment and Plan:  Pregnancy: G2P1001 at [redacted]w[redacted]d 1. Encounter  for supervision of low-risk pregnancy in third trimester - No complaints, patient does not have access to BP cuff. - Reviewed warning signs for HTN and pre-eclampsia. Patient is currently asymptomatic   2. History of seizure disorder  3. Rubella non-immune status, antepartum - Immunization PP   4. Prenatal care insufficient, third trimester - Patient missed last lab visit for 2 hour GTT due to transportation - Hgb A1c in third trimester was normal   Term labor symptoms and general obstetric precautions including but not limited to vaginal bleeding, contractions, leaking of fluid and fetal movement were reviewed in detail with the patient.  Return in about 1 week (around 02/23/2019) for LOB, In-Person.  Future Appointments  Date Time Provider [place]  02/23/2019 11:15 AM Constant, Vickii Chafe, MD [facility]OC  03/03/2019  9:15 AM Emily Filbert, MD [facility]OC     Time spent on virtual visit: 6 minutes  Kerry Hough, PA-C

## 2019-02-16 NOTE — Progress Notes (Signed)
I connected with  Frances [name] on 02/16/19 at  2:15 PM EDT by telephone and verified that I am speaking with the correct person using two identifiers.   I discussed the limitations, risks, security and privacy concerns of performing an evaluation and management service by telephone and the availability of in person appointments. I also discussed with the patient that there may be a patient responsible charge related to this service. The patient expressed understanding and agreed to proceed.  [place], [place] 02/16/2019  2:19 PM

## 2019-02-22 DIAGNOSIS — O2690 Pregnancy related conditions, unspecified, unspecified trimester: Secondary | ICD-10-CM | POA: Diagnosis not present

## 2019-02-22 DIAGNOSIS — R5381 Other malaise: Secondary | ICD-10-CM | POA: Diagnosis not present

## 2019-02-23 ENCOUNTER — Inpatient Hospital Stay (HOSPITAL_COMMUNITY)
Admission: AD | Admit: 2019-02-23 | Discharge: 2019-02-24 | DRG: 776 | Disposition: A | Discharge status: Home / Self Care | Payer: Medicaid Other | Attending: Obstetrics and Gynecology | Admitting: Obstetrics and Gynecology

## 2019-02-23 ENCOUNTER — Encounter (HOSPITAL_COMMUNITY): Payer: Self-pay

## 2019-02-23 ENCOUNTER — Encounter: Payer: Medicaid Other | Admitting: Obstetrics and Gynecology

## 2019-02-23 ENCOUNTER — Other Ambulatory Visit: Payer: Self-pay

## 2019-02-23 DIAGNOSIS — Z8669 Personal history of other diseases of the nervous system and sense organs: Secondary | ICD-10-CM

## 2019-02-23 DIAGNOSIS — Z1159 Encounter for screening for other viral diseases: Secondary | ICD-10-CM

## 2019-02-23 DIAGNOSIS — Z3A39 39 weeks gestation of pregnancy: Secondary | ICD-10-CM | POA: Diagnosis not present

## 2019-02-23 DIAGNOSIS — Z87441 Personal history of nephrotic syndrome: Secondary | ICD-10-CM

## 2019-02-23 DIAGNOSIS — Z283 Underimmunization status: Secondary | ICD-10-CM

## 2019-02-23 DIAGNOSIS — Z8709 Personal history of other diseases of the respiratory system: Secondary | ICD-10-CM

## 2019-02-23 DIAGNOSIS — O09899 Supervision of other high risk pregnancies, unspecified trimester: Secondary | ICD-10-CM

## 2019-02-23 DIAGNOSIS — Z3493 Encounter for supervision of normal pregnancy, unspecified, third trimester: Secondary | ICD-10-CM

## 2019-02-23 DIAGNOSIS — Z87440 Personal history of urinary (tract) infections: Secondary | ICD-10-CM | POA: Diagnosis present

## 2019-02-23 DIAGNOSIS — Z87448 Personal history of other diseases of urinary system: Secondary | ICD-10-CM

## 2019-02-23 LAB — CBC
HCT: 27.4 % — ABNORMAL LOW (ref 36.0–46.0)
Hemoglobin: 9.1 g/dL — ABNORMAL LOW (ref 12.0–15.0)
MCH: 27.6 pg (ref 26.0–34.0)
MCHC: 33.2 g/dL (ref 30.0–36.0)
MCV: 83 fL (ref 80.0–100.0)
Platelets: 299 10*3/uL (ref 150–400)
RBC: 3.3 MIL/uL — ABNORMAL LOW (ref 3.87–5.11)
RDW: 12.2 % (ref 11.5–15.5)
WBC: 11 10*3/uL — ABNORMAL HIGH (ref 4.0–10.5)
nRBC: 0 % (ref 0.0–0.2)

## 2019-02-23 LAB — SARS CORONAVIRUS 2 BY RT PCR (HOSPITAL ORDER, PERFORMED IN ~~LOC~~ HOSPITAL LAB): SARS Coronavirus 2: NEGATIVE

## 2019-02-23 LAB — RPR: RPR Ser Ql: NONREACTIVE

## 2019-02-23 MED ORDER — OXYTOCIN 10 UNIT/ML IJ SOLN
10.0000 [IU] | Freq: Once | INTRAMUSCULAR | Status: AC
  Administered 2019-02-23: 10 [IU] via INTRAMUSCULAR
  Filled 2019-02-23: qty 1

## 2019-02-23 MED ORDER — BENZOCAINE-MENTHOL 20-0.5 % EX AERO: 1.0000 "application " | INHALATION_SPRAY | CUTANEOUS | Status: DC | PRN

## 2019-02-23 MED ORDER — SIMETHICONE 80 MG PO CHEW: 80.0000 mg | CHEWABLE_TABLET | ORAL | Status: DC | PRN

## 2019-02-23 MED ORDER — OXYTOCIN 40 UNITS IN NORMAL SALINE INFUSION - SIMPLE MED: 2.5000 [IU]/h | INTRAVENOUS | Status: DC

## 2019-02-23 MED ORDER — ZOLPIDEM TARTRATE 5 MG PO TABS: 5.0000 mg | ORAL_TABLET | Freq: Every evening | ORAL | Status: DC | PRN

## 2019-02-23 MED ORDER — SODIUM CHLORIDE 0.9% FLUSH: 3.0000 mL | Freq: Two times a day (BID) | INTRAVENOUS | Status: DC

## 2019-02-23 MED ORDER — ONDANSETRON HCL 4 MG/2ML IJ SOLN: 4.0000 mg | INTRAMUSCULAR | Status: DC | PRN

## 2019-02-23 MED ORDER — ONDANSETRON HCL 4 MG/2ML IJ SOLN: 4.0000 mg | Freq: Four times a day (QID) | INTRAMUSCULAR | Status: DC | PRN

## 2019-02-23 MED ORDER — ONDANSETRON HCL 4 MG PO TABS: 4.0000 mg | ORAL_TABLET | ORAL | Status: DC | PRN

## 2019-02-23 MED ORDER — ACETAMINOPHEN 325 MG PO TABS: 650.0000 mg | ORAL_TABLET | ORAL | Status: DC | PRN

## 2019-02-23 MED ORDER — SENNOSIDES-DOCUSATE SODIUM 8.6-50 MG PO TABS
2.0000 | ORAL_TABLET | ORAL | Status: DC
  Administered 2019-02-24: 2 via ORAL
  Filled 2019-02-23: qty 2

## 2019-02-23 MED ORDER — OXYTOCIN BOLUS FROM INFUSION: 500.0000 mL | Freq: Once | INTRAVENOUS | Status: DC

## 2019-02-23 MED ORDER — OXYCODONE-ACETAMINOPHEN 5-325 MG PO TABS: 1.0000 | ORAL_TABLET | ORAL | Status: DC | PRN

## 2019-02-23 MED ORDER — TETANUS-DIPHTH-ACELL PERTUSSIS 5-2.5-18.5 LF-MCG/0.5 IM SUSP: 0.5000 mL | Freq: Once | INTRAMUSCULAR | Status: DC

## 2019-02-23 MED ORDER — PRENATAL MULTIVITAMIN CH
1.0000 | ORAL_TABLET | Freq: Every day | ORAL | Status: DC
  Administered 2019-02-23 – 2019-02-24 (×2): 1 via ORAL
  Filled 2019-02-23 (×2): qty 1

## 2019-02-23 MED ORDER — WITCH HAZEL-GLYCERIN EX PADS: 1.0000 "application " | MEDICATED_PAD | CUTANEOUS | Status: DC | PRN

## 2019-02-23 MED ORDER — LACTATED RINGERS IV SOLN: 500.0000 mL | INTRAVENOUS | Status: DC | PRN

## 2019-02-23 MED ORDER — IBUPROFEN 600 MG PO TABS
600.0000 mg | ORAL_TABLET | Freq: Four times a day (QID) | ORAL | Status: DC
  Administered 2019-02-23 – 2019-02-24 (×6): 600 mg via ORAL
  Filled 2019-02-23 (×6): qty 1

## 2019-02-23 MED ORDER — COCONUT OIL OIL: 1.0000 "application " | TOPICAL_OIL | Status: DC | PRN

## 2019-02-23 MED ORDER — OXYCODONE-ACETAMINOPHEN 5-325 MG PO TABS: 2.0000 | ORAL_TABLET | ORAL | Status: DC | PRN

## 2019-02-23 MED ORDER — DIBUCAINE (PERIANAL) 1 % EX OINT: 1.0000 "application " | TOPICAL_OINTMENT | CUTANEOUS | Status: DC | PRN

## 2019-02-23 MED ORDER — DIPHENHYDRAMINE HCL 25 MG PO CAPS: 25.0000 mg | ORAL_CAPSULE | Freq: Four times a day (QID) | ORAL | Status: DC | PRN

## 2019-02-23 MED ORDER — LACTATED RINGERS IV SOLN: INTRAVENOUS | Status: DC

## 2019-02-23 MED ORDER — SODIUM CHLORIDE 0.9% FLUSH: 3.0000 mL | INTRAVENOUS | Status: DC | PRN

## 2019-02-23 MED ORDER — LIDOCAINE HCL (PF) 1 % IJ SOLN: 30.0000 mL | INTRAMUSCULAR | Status: DC | PRN

## 2019-02-23 MED ORDER — MEASLES, MUMPS & RUBELLA VAC IJ SOLR
0.5000 mL | Freq: Once | INTRAMUSCULAR | Status: AC
  Administered 2019-02-24: 12:00:00 0.5 mL via SUBCUTANEOUS
  Filled 2019-02-23: qty 0.5

## 2019-02-23 MED ORDER — SODIUM CHLORIDE 0.9 % IV SOLN: 250.0000 mL | INTRAVENOUS | Status: DC | PRN

## 2019-02-23 MED ORDER — SOD CITRATE-CITRIC ACID 500-334 MG/5ML PO SOLN: 30.0000 mL | ORAL | Status: DC | PRN

## 2019-02-23 NOTE — Progress Notes (Signed)
Post Partum Day 1: S/p  Unattended SVD at home 02/22/19 at 2313 Subjective: no complaints, up ad lib, tolerating PO and + flatus. Patient states she has not voided since prior to delivery.  Objective: Blood pressure 112/72, pulse 88, temperature 98 F (36.7 C), temperature source Oral, resp. rate 18, last menstrual period 05/26/2018, SpO2 99 %, unknown if currently breastfeeding.  Physical Exam:  General: cooperative, appears stated age and fatigued Lochia: appropriate Uterine Fundus: firm Incision: N/A DVT Evaluation: No evidence of DVT seen on physical exam.  Recent Labs    02/23/19 0620  HGB 9.1*  HCT 27.4*    Assessment/Plan: Continue routine care Day shift nurse Serva, RN notified that patient has not voided and no Is and Os are charted. Asked to schedule void before 0800.   LOS: 0 days   Darlina Rumpf, CNM 02/23/2019, 7:16 AM

## 2019-02-23 NOTE — Discharge Summary (Signed)
Postpartum Discharge Summary     Patient Name: Frances [name] DOB: 1996-07-28 MRN: [id number]  Date of admission: 02/23/2019 Delivering Provider:    Date of discharge: 02/24/2019  Admitting diagnosis: LABOR Intrauterine pregnancy: [redacted]w[redacted]d    Secondary diagnosis:  Active Problems:   Rubella non-immune status, antepartum   Recurrent UTI   History of asthma   History of seizure disorder   Labor and delivery, indication for care   SVD (spontaneous vaginal delivery)  Additional problems: Unattended delivery at home, anemia of pregnancy      Discharge diagnosis: Term Pregnancy Delivered                                                                                                Post partum procedures:MMR vaccine   Augmentation: Foley Balloon  Complications: None  Hospital course:   23y.o. yo G2P1001 at 21w0das admitted after delivery of viable infant at home on 02/23/2019. Placenta delivered spontaneously once arrived to L&D. Noted small hemostatic vaginal abrasion. Patient had a delivery of a Viable infant. Information for the patient's newborn:  CoJudeth, Gilles0[[id number]]Delivery Method: Vaginal, Spontaneous(Filed from Delivery Summary)     Pateint had an uncomplicated postpartum course.  She is ambulating, tolerating a regular diet, passing flatus, and urinating well. Patient is discharged home in stable condition on 02/24/19.   Magnesium Sulfate recieved: No BMZ received: No  Physical exam  Vitals:   02/23/19 1106 02/23/19 1529 02/23/19 2154 02/24/19 0513  BP: 118/68 102/61 113/60 101/68  Pulse:   67 71  Resp: _0 Temp: 97.8 F (36.6 C) 98.6 F (37 C) 97.9 F (36.6 C) 98 F (36.7 C)  TempSrc: Oral Oral Oral Oral  SpO2: 99% 100% 100% 98%   General: alert and cooperative Lochia: appropriate Uterine Fundus: firm Incision: N/A DVT Evaluation: No evidence of DVT seen on physical exam. Labs: Lab Results  Component Value Date   WBC 11.0  (H) 02/23/2019   HGB 9.1 (L) 02/23/2019   HCT 27.4 (L) 02/23/2019   MCV 83.0 02/23/2019   PLT 299 02/23/2019   CMP Latest Ref Rng & Units 11/25/2018  Glucose 70 - 99 mg/dL 88  BUN 6 - 20 mg/dL 7  Creatinine 0.44 - 1.00 mg/dL 0.43(L)  Sodium 135 - 145 mmol/L 135  Potassium 3.5 - 5.1 mmol/L 3.7  Chloride 98 - 111 mmol/L 104  CO2 22 - 32 mmol/L 20(L)  Calcium 8.9 - 10.3 mg/dL 9.1  Total Protein 6.5 - 8.1 g/dL 5.7(L)  Total Bilirubin 0.3 - 1.2 mg/dL 0.4  Alkaline Phos 38 - 126 U/L 60  AST 15 - 41 U/L 16  ALT 0 - 44 U/L 12    Discharge instruction: per After Visit Summary and "Baby and Me Booklet".  After visit meds:  Allergies as of 02/24/2019      Reactions   Vancomycin    Nephrotoxicity      Medication List    TAKE these medications   famotidine 20 MG tablet Commonly known as: PEPCID Take 1 tablet (20 mg  total) by mouth 2 (two) times daily for 30 days.   ibuprofen 600 MG tablet Commonly known as: ADVIL Take 1 tablet (600 mg total) by mouth every 6 (six) hours.   Prenatal Complete 14-0.4 MG Tabs Take 1 tablet by mouth daily.   senna-docusate 8.6-50 MG tablet Commonly known as: Senokot-S Take 2 tablets by mouth daily. Start taking on: February 25, 2019       Diet: routine diet  Activity: Advance as tolerated. Pelvic rest for 6 weeks.   Outpatient follow up:4 weeks Follow up Appt: Future Appointments  Date Time Provider [place]  03/23/2019  1:55 PM Manya Silvas, [place]   Follow up Visit: [place] [facility]. Go on 03/23/2019.   Specialty: Obstetrics and Gynecology Why: Postpartum visit with Milbert Coulter at 1:55 pm  Contact information: [address] [place], [place] [id number] [place] [id number] [phone]           Please schedule this patient for Postpartum visit in: 4 weeks with the following provider: Any provider For C/S patients schedule  nurse incision check in weeks 2 weeks: no Low risk pregnancy complicated by: recurrent UTIs, anemia  Delivery mode:  SVD Anticipated Birth Control:  OCPs PP Procedures needed: None   Schedule Integrated BH visit: no      Newborn Data: Live born female  Birth Weight:  2914g  APGAR: N/A (home delivery)   Newborn Delivery   Birth date/time:  Delivery type:       Baby Feeding: Bottle Disposition:home with mother   02/24/2019 Melina Schools, DO

## 2019-02-23 NOTE — H&P (Addendum)
LABOR AND DELIVERY ADMISSION HISTORY AND PHYSICAL NOTE  Frances [name] is a 23 y.o. female G2P1001 with IUP at 26w0dby LMP presenting following unattended delivery of infant at home by herself after feeling increased pressure. Delivered approximately at 2315 on 7/13, brought in by EMS afterwards. Placenta still in place. Started feeling contractions on 7/13 morning. Patient reports spontaneous cry of infant, no difficulty with delivery.   [facility] Pregnancy complications:  - Rubella equivocal  - Recurrent UTIs  - Anemia of pregnancy (Hgb 9.6 on 6/23)  - History of gallstones, no concerns during pregnancy    Past Medical History: Past Medical History:  Diagnosis Date  . Anemia    on Iron supplements  . Asthma 1999   as a child; no current problems  . Chronic kidney disease    as a child  . Constipation   . Gall stones   . [place]    seizure disorder as a child, treated with medication; none since  . UTI (lower urinary tract infection)     Past Surgical History: Past Surgical History:  Procedure Laterality Date  . IRRIGATION AND DEBRIDEMENT ABSCESS Left 06/11/2017   Procedure: IRRIGATION, DRAINAGE AND DEBRIDEMENT LEFT FLANK NECROTIZING SOFT TISSUE INFECTION;  Surgeon: RJackolyn Confer MD;  Location: [facility] ORS;  Service: General;  Laterality: Left;  . TYMPANOSTOMY TUBE PLACEMENT    . WOUND DEBRIDEMENT Left 06/12/2017   Procedure: DRESSING CHANGE AND DEBRIDEMENT OF WOUND;  Surgeon: RJackolyn Confer MD;  Location: [facility];  Service: General;  Laterality: Left;    Obstetrical History: OB History    Gravida  2   Para  1   Term  1   Preterm  0   AB  0   Living  1     SAB  0   TAB  0   Ectopic  0   Multiple  0   Live Births  1           Social History: Social History   Socioeconomic History  . Marital status: Single    Spouse name: Not on file  . Number of children: 1  . Years of education: 153 . Highest  education level: High school graduate  Occupational History  . Not on file  Social Needs  . Financial resource strain: Not on file  . Food insecurity    Worry: Never true    Inability: Never true  . Transportation needs    Medical: No    Non-medical: No  Tobacco Use  . Smoking status: Never Smoker  . Smokeless tobacco: Never Used  Substance and Sexual Activity  . Alcohol use: No  . Drug use: Yes    Frequency: 2.0 times per week    Types: Marijuana    Comment: last used 05/2018  . Sexual activity: Yes    Birth control/protection: None  Lifestyle  . Physical activity    Days per week: Not on file    Minutes per session: Not on file  . Stress: Not on file  Relationships  . Social [place] phone: Not on file    Gets together: Not on file    Attends religious service: Not on file    Active member of club or organization: Not on file    Attends meetings of clubs or organizations: Not on file    Relationship status: Not on file  Other Topics Concern  . Not on file  Social History Narrative  . Not on file    Family History: Family History  Problem Relation Age of Onset  . Asthma Mother   . Hypertension Mother   . Asthma Sister   . Birth defects Sister        Down's syndrome  . Intellectual disability Father     Allergies: Allergies  Allergen Reactions  . Vancomycin     Nephrotoxicity    Medications Prior to Admission  Medication Sig Dispense Refill Last Dose  . famotidine (PEPCID) 20 MG tablet Take 1 tablet (20 mg total) by mouth 2 (two) times daily for 30 days. 60 tablet 0   . Prenatal Vit-Fe Fumarate-FA (PRENATAL COMPLETE) 14-0.4 MG TABS Take 1 tablet by mouth daily. 60 each 0      Review of Systems  All systems reviewed and negative except as stated in HPI  Physical Exam Last menstrual period 05/26/2018, not currently breastfeeding. General appearance: alert, oriented, NAD Lungs: normal respiratory effort Heart: regular  rate Extremities: No calf swelling or tenderness   Prenatal labs: ABO, Rh: A/Positive/-- (01/07 1131) Antibody: Negative (01/07 1131) Rubella: 0.96 (01/07 1131) RPR: Non Reactive (06/23 1619)  HBsAg: Negative (01/07 1131)  HIV: Non Reactive (06/23 1619)  GC/Chlamydia: negative  GBS:   negative  2-hr GTT: Normal  Genetic screening: low risk NIPS  Anatomy US: Normal   Prenatal Transfer Tool  Maternal Diabetes: No Genetic Screening: Normal Maternal Ultrasounds/Referrals: Normal Fetal Ultrasounds or other Referrals:  None Maternal Substance Abuse:  No Significant Maternal Medications:  None Significant Maternal Lab Results: Group B Strep negative  No results found for this or any previous visit (from the past 24 hour(s)).  Patient Active Problem List   Diagnosis Date Noted  . Supervision of low-risk pregnancy 08/18/2018  . History of asthma 08/18/2018  . History of seizure disorder 08/18/2018  . History of kidney disease as a child 08/18/2018  . Gallstones 10/23/2016  . Recurrent UTI 09/16/2016  . Rubella non-immune status, antepartum 07/20/2016    Assessment: Frances [name] is a 23 y.o. G2P1001 at 66w0dhere following unattended delivery at home.   #Labor: delivered, placenta delivery spontaneous while in L&D  #ID: GBS negative  #MOF: Formula  #MOC: Undecided, aware of options   1. Rubella non-immune:  Will need MMR postpartum.   SPatriciaann Clan7/14/2020, 12:16 AM    I personally was present during the history, physical exam and medical decision-making activities of this service and have verified that the service and findings are accurately documented in the resident's note.   SMallie SnooksCertified Nurse Midwife Faculty Practice 02/23/19 12:37 AM

## 2019-02-23 NOTE — Progress Notes (Addendum)
Patient reported to L&D unit via EMS after delivering baby at home.  Upon arrival mom and baby were stable, only placenta still undelivered. Provider was notified and provider attended a swift placental delivery. EMS reported minimal bleeding at site, estimated time of delivery to be 2313 on 02/22/2019, and assigned 10 minute APGAR score of 10. Maternal and Neonatal recovery were uncomplicated and findings were WDL.

## 2019-02-24 DIAGNOSIS — Z3A39 39 weeks gestation of pregnancy: Secondary | ICD-10-CM

## 2019-02-24 MED ORDER — IBUPROFEN 600 MG PO TABS: 600.0000 mg | ORAL_TABLET | Freq: Four times a day (QID) | ORAL | 1 refills | Status: DC

## 2019-02-24 MED ORDER — SENNOSIDES-DOCUSATE SODIUM 8.6-50 MG PO TABS: 2.0000 | ORAL_TABLET | ORAL | 0 refills | Status: DC

## 2019-02-24 NOTE — Discharge Instructions (Signed)

## 2019-03-03 ENCOUNTER — Encounter: Payer: Medicaid Other | Admitting: Obstetrics & Gynecology

## 2019-03-23 ENCOUNTER — Other Ambulatory Visit: Payer: Self-pay

## 2019-03-23 ENCOUNTER — Telehealth (INDEPENDENT_AMBULATORY_CARE_PROVIDER_SITE_OTHER): Payer: Medicaid Other | Admitting: Advanced Practice Midwife

## 2019-03-23 ENCOUNTER — Encounter: Payer: Self-pay | Admitting: Advanced Practice Midwife

## 2019-03-23 DIAGNOSIS — Z3009 Encounter for other general counseling and advice on contraception: Secondary | ICD-10-CM

## 2019-03-23 NOTE — Progress Notes (Signed)
TELEHEALTH POSTPARTUM VIRTUAL VIDEO VISIT ENCOUNTER NOTE    Provider location: [facility] for [employer] at [facility]   I connected with Delbert Phenix on 03/23/19 at  1:55 PM EDT by MyChart Video Encounter at home and verified that I am speaking with the correct person using two identifiers.    I discussed the limitations, risks, security and privacy concerns of performing an evaluation and management service virtually and the availability of in person appointments. I also discussed with the patient that there may be a patient responsible charge related to this service. The patient expressed understanding and agreed to proceed.  Chief Complaint: Postpartum Visit  History of Present Illness: Frances [name] is a 23 y.o. African-American G2P2002 being evaluated for postpartum followup.    She is s/p normal spontaneous vaginal delivery at home (with placenta delivered intact at the hospital) on 02/23/19 at 39.0 weeks; she was discharged to home on 02/24/19 PPD#1. Pregnancy complicated by home birth. Baby is doing well, bottle feeding, and gaining weight.  She has no complaints today.  Vaginal bleeding or discharge: No  Intercourse: No  Contraception: no method Mode of feeding infant: Bottle PP depression s/s: No .  Any bowel or bladder issues: No  Pap smear: Will be due in October   Review of Systems: Her review of systems is negative.  Patient Active Problem List   Diagnosis Date Noted  . SVD (spontaneous vaginal delivery) 02/24/2019  . Labor and delivery, indication for care 02/23/2019  . Supervision of low-risk pregnancy 08/18/2018  . History of asthma 08/18/2018  . History of seizure disorder 08/18/2018  . History of kidney disease as a child 08/18/2018  . Gallstones 10/23/2016  . Recurrent UTI 09/16/2016  . Rubella non-immune status, antepartum 07/20/2016    Medications Delbert Phenix had no medications administered during this visit. Current Outpatient  Medications  Medication Sig Dispense Refill  . ibuprofen (ADVIL) 600 MG tablet Take 1 tablet (600 mg total) by mouth every 6 (six) hours. 60 tablet 1  . Prenatal Vit-Fe Fumarate-FA (PRENATAL COMPLETE) 14-0.4 MG TABS Take 1 tablet by mouth daily. 60 each 0  . senna-docusate (SENOKOT-S) 8.6-50 MG tablet Take 2 tablets by mouth daily. (Patient not taking: Reported on 03/23/2019) 20 tablet 0   No current facility-administered medications for this visit.     Allergies Vancomycin  Physical Exam:  LMP 05/26/2018 (Exact Date)  General:  Alert, oriented and cooperative. Patient is in no acute distress.  Mental Status: Normal mood and affect. Normal behavior. Normal judgment and thought content.   Respiratory: Normal respiratory effort noted, no problems with respiration noted  Rest of physical exam deferred due to type of encounter  PP Depression Screening:   Edinburgh Postnatal Depression Scale Screening Tool 03/23/2019 02/24/2019 02/23/2019 03/01/2017  I have been able to laugh and see the funny side of things. 0 0 (No Data) 0  I have looked forward with enjoyment to things. 0 0 - 0  I have blamed myself unnecessarily when things went wrong. 0 0 - 0  I have been anxious or worried for no good reason. 0 0 - 0  I have felt scared or panicky for no good reason. 0 0 - 0  Things have been getting on top of me. 0 0 - 0  I have been so unhappy that I have had difficulty sleeping. 0 0 - 0  I have felt sad or miserable. 0 0 - 0  I have been  so unhappy that I have been crying. 0 0 - 0  The thought of harming myself has occurred to me. 0 0 - 0  Edinburgh Postnatal Depression Scale Total 0 0 - 0     Assessment:Patient is a 23 y.o. [id number] who is 4 weeks postpartum from a normal spontaneous vaginal delivery.  She is doing well.   Plan: 1. Postpartum care following vaginal delivery Doing well, no concerns. Has not yet had intercourse. Baby is doing well. She will need her Pap smear in October, after she  turns 23.  2. Encounter for counseling regarding contraception Patient has been counseled on birth control options today. We discussed risks and benefits of each available contraception, including but not limited to: IUD, Nexplanon, Depo Shot, Nuvaring, OCPs, and condoms/diaphragms. Counseled on risk for STDs not reduced by birth control use.  She is undecided on preferred method of contraception at this time and wishes to think about it further. She was counseled on the risk of unintended pregnancy without contraception.  RTC in 3 months for Pap smear.  I discussed the assessment and treatment plan with the patient. The patient was provided an opportunity to ask questions and all were answered. The patient agreed with the plan and demonstrated an understanding of the instructions.   The patient was advised to call back or seek an in-person evaluation/go to the ED for any concerning postpartum symptoms.  I provided 10 minutes of face-to-face time during this encounter.

## 2019-03-23 NOTE — Patient Instructions (Addendum)
Pap Test Why am I having this test? A Pap test, also called a Pap smear, is a screening test to check for signs of:  Cancer of the vagina, cervix, and uterus. The cervix is the lower part of the uterus that opens into the vagina.  Infection.  Changes that may be a sign that cancer is developing (precancerous changes). Women need this test on a regular basis. In general, you should have a Pap test every 3 years until you reach menopause or age 21. Women aged 30-60 may choose to have their Pap test done at the same time as an HPV (human papillomavirus) test every 5 years (instead of every 3 years). Your health care provider may recommend having Pap tests more or less often depending on your medical conditions and past Pap test results. What kind of sample is taken?  Your health care provider will collect a sample of cells from the surface of your cervix. This will be done using a small cotton swab, plastic spatula, or brush. This sample is often collected during a pelvic exam, when you are lying on your back on an exam table with feet in footrests (stirrups). In some cases, fluids (secretions) from the cervix or vagina may also be collected. How do I prepare for this test?  Be aware of where you are in your menstrual cycle. If you are menstruating on the day of the test, you may be asked to reschedule.  You may need to reschedule if you have a known vaginal infection on the day of the test.  Follow instructions from your health care provider about: ? Changing or stopping your regular medicines. Some medicines can cause abnormal test results, such as digitalis and tetracycline. ? Avoiding douching or taking a bath the day before or the day of the test. Tell a health care provider about:  Any allergies you have.  All medicines you are taking, including vitamins, herbs, eye drops, creams, and over-the-counter medicines.  Any blood disorders you have.  Any surgeries you have had.  Any  medical conditions you have.  Whether you are pregnant or may be pregnant. How are the results reported? Your test results will be reported as either abnormal or normal. A false-positive result can occur. A false positive is incorrect because it means that a condition is present when it is not. A false-negative result can occur. A false negative is incorrect because it means that a condition is not present when it is. What do the results mean? A normal test result means that you do not have signs of cancer of the vagina, cervix, or uterus. An abnormal result may mean that you have:  Cancer. A Pap test by itself is not enough to diagnose cancer. You will have more tests done in this case.  Precancerous changes in your vagina, cervix, or uterus.  Inflammation of the cervix.  An STD (sexually transmitted disease).  A fungal infection.  A parasite infection. Talk with your health care provider about what your results mean. Questions to ask your health care provider Ask your health care provider, or the department that is doing the test:  When will my results be ready?  How will I get my results?  What are my treatment options?  What other tests do I need?  What are my next steps? Summary  In general, women should have a Pap test every 3 years until they reach menopause or age 21.  Your health care provider will collect a   sample of cells from the surface of your cervix. This will be done using a small cotton swab, plastic spatula, or brush.  In some cases, fluids (secretions) from the cervix or vagina may also be collected. This information is not intended to replace advice given to you by your health care provider. Make sure you discuss any questions you have with your health care provider. Document Released: 10/19/2002 Document Revised: 04/07/2017 Document Reviewed: 04/07/2017 Elsevier Patient Education  2020 Elsevier Inc.  Contraception Choices Contraception, also called  birth control, means things to use or ways to try not to get pregnant. Hormonal birth control This kind of birth control uses hormones. Here are some types of hormonal birth control:  A tube that is put under skin of the arm (implant). The tube can stay in for as long as 3 years.  Shots to get every 3 months (injections).  Pills to take every day (birth control pills).  A patch to change 1 time each week for 3 weeks (birth control patch). After that, the patch is taken off for 1 week.  A ring to put in the vagina. The ring is left in for 3 weeks. Then it is taken out of the vagina for 1 week. Then a new ring is put in.  Pills to take after unprotected sex (emergency birth control pills). Barrier birth control Here are some types of barrier birth control:  A thin covering that is put on the penis before sex (female condom). The covering is thrown away after sex.  A soft, loose covering that is put in the vagina before sex (female condom). The covering is thrown away after sex.  A rubber bowl that sits over the cervix (diaphragm). The bowl must be made for you. The bowl is put into the vagina before sex. The bowl is left in for 6-8 hours after sex. It is taken out within 24 hours.  A small, soft cup that fits over the cervix (cervical cap). The cup must be made for you. The cup can be left in for 6-8 hours after sex. It is taken out within 48 hours.  A sponge that is put into the vagina before sex. It must be left in for at least 6 hours after sex. It must be taken out within 30 hours. Then it is thrown away.  A chemical that kills or stops sperm from getting into the uterus (spermicide). It may be a pill, cream, jelly, or foam to put in the vagina. The chemical should be used at least 10-15 minutes before sex. IUD (intrauterine) birth control An IUD is a small, T-shaped piece of plastic. It is put inside the uterus. There are two kinds:  Hormone IUD. This kind can stay in for 3-5  years.  Copper IUD. This kind can stay in for 10 years. Permanent birth control Here are some types of permanent birth control:  Surgery to block the fallopian tubes.  Having an insert put into each fallopian tube.  Surgery to tie off the tubes that carry sperm (vasectomy). Natural planning birth control Here are some types of natural planning birth control:  Not having sex on the days the woman could get pregnant.  Using a calendar: ? To keep track of the length of each period. ? To find out what days pregnancy can happen. ? To plan to not have sex on days when pregnancy can happen.  Watching for symptoms of ovulation and not having sex during ovulation. One way the   way the woman can check for ovulation is to check her temperature.  Waiting to have sex until after ovulation. Summary  Contraception, also called birth control, means things to use or ways to try not to get pregnant.  Hormonal methods of birth control include implants, injections, pills, patches, vaginal rings, and emergency birth control pills.  Barrier methods of birth control can include female condoms, female condoms, diaphragms, cervical caps, sponges, and spermicides.  There are two types of IUD (intrauterine device) birth control. An IUD can be put in a woman's uterus to prevent pregnancy for 3-5 years.  Permanent sterilization can be done through a procedure for males, females, or both.  Natural planning methods involve not having sex on the days when the woman could get pregnant. This information is not intended to replace advice given to you by your health care provider. Make sure you discuss any questions you have with your health care provider. Document Released: 05/26/2009 Document Revised: 11/18/2018 Document Reviewed: 08/08/2016 Elsevier Patient Education  2020 [employer].

## 2019-05-22 ENCOUNTER — Ambulatory Visit
Admission: EM | Admit: 2019-05-22 | Discharge: 2019-05-22 | Disposition: A | Discharge status: Home / Self Care | Payer: Medicaid Other | Attending: Emergency Medicine | Admitting: Emergency Medicine

## 2019-05-22 ENCOUNTER — Other Ambulatory Visit: Payer: Self-pay

## 2019-05-22 DIAGNOSIS — Z3202 Encounter for pregnancy test, result negative: Secondary | ICD-10-CM | POA: Diagnosis not present

## 2019-05-22 DIAGNOSIS — R1084 Generalized abdominal pain: Secondary | ICD-10-CM

## 2019-05-22 DIAGNOSIS — R110 Nausea: Secondary | ICD-10-CM | POA: Diagnosis not present

## 2019-05-22 LAB — POCT URINE PREGNANCY: Preg Test, Ur: NEGATIVE

## 2019-05-22 MED ORDER — ONDANSETRON 4 MG PO TBDP: 4.0000 mg | ORAL_TABLET | Freq: Three times a day (TID) | ORAL | 0 refills | Status: DC | PRN

## 2019-05-22 NOTE — ED Provider Notes (Signed)
[name]    CSN: [id number] Arrival date & time: 05/22/19  1552      History   Chief Complaint No chief complaint on file.   HPI Frances [name] is a 23 y.o. female with history of gallstones, constipation presenting for generalized abdominal pain since this morning.  Patient states that she has dealt with this in the past, feels similar to previous episodes.  States that she feels is related to what she eats, though is unable to give specific examples.  Patient endorses nausea "sometimes, but not right now".  Denies vomiting, diarrhea.  Last bowel movement yesterday without blood or melena.  Patient has not tried thing for her pain.  Of note, patient vaginally delivered her daughter in July without complication.  States she had her first menstrual cycle postpartum 8/21, has not had it since.  Currently sexually active.   Past Medical History:  Diagnosis Date  . Anemia    on Iron supplements  . Asthma 1999   as a child; no current problems  . Chronic kidney disease    as a child  . Constipation   . Gall stones   . Gallstones 10/23/2016   10/2016: Seen by [facility], pt states, and plans for PP follow up. Advised her on low fat diet  . Seizures ([place])    seizure disorder as a child, treated with medication; none since  . UTI (lower urinary tract infection)     Patient Active Problem List   Diagnosis Date Noted  . History of asthma 08/18/2018  . History of seizure disorder 08/18/2018  . History of kidney disease as a child 08/18/2018  . Gallstones 10/23/2016  . History of recurrent UTIs 09/16/2016    Past Surgical History:  Procedure Laterality Date  . IRRIGATION AND DEBRIDEMENT ABSCESS Left 06/11/2017   Procedure: IRRIGATION, DRAINAGE AND DEBRIDEMENT LEFT FLANK NECROTIZING SOFT TISSUE INFECTION;  Surgeon: Jackolyn Confer, MD;  Location: [facility];  Service: General;  Laterality: Left;  . TYMPANOSTOMY TUBE PLACEMENT    . WOUND DEBRIDEMENT Left 06/12/2017   Procedure: DRESSING CHANGE AND DEBRIDEMENT OF WOUND;  Surgeon: Jackolyn Confer, MD;  Location: [facility];  Service: General;  Laterality: Left;    OB History    Gravida  2   Para  2   Term  2   Preterm  0   AB  0   Living  2     SAB  0   TAB  0   Ectopic  0   Multiple  0   Live Births  2            Home Medications    Prior to Admission medications   Medication Sig Start Date End Date Taking? Authorizing Provider  ibuprofen (ADVIL) 600 MG tablet Take 1 tablet (600 mg total) by mouth every 6 (six) hours. 02/24/19   Nicolette Bang, DO  ondansetron (ZOFRAN ODT) 4 MG disintegrating tablet Take 1 tablet (4 mg total) by mouth every 8 (eight) hours as needed for nausea or vomiting. 05/22/19   Hall-Potvin, [place], PA-C  Prenatal Vit-Fe Fumarate-FA (PRENATAL COMPLETE) 14-0.4 MG TABS Take 1 tablet by mouth daily. 06/22/18   Zigmund Gottron, NP  senna-docusate (SENOKOT-S) 8.6-50 MG tablet Take 2 tablets by mouth daily. Patient not taking: Reported on 03/23/2019 02/25/19   Nicolette Bang, DO    Family History Family History  Problem Relation Age of Onset  . Asthma Mother   . Hypertension Mother   .  Asthma Sister   . Birth defects Sister        Down's syndrome  . Intellectual disability Father     Social History Social History   Tobacco Use  . Smoking status: Never Smoker  . Smokeless tobacco: Never Used  Substance Use Topics  . Alcohol use: No  . Drug use: Not Currently    Frequency: 2.0 times per week    Types: Marijuana    Comment: last used 05/2018     Allergies   Vancomycin   Review of Systems Review of Systems  Constitutional: Negative for fatigue and fever.  HENT: Negative for ear pain, sinus pain, sore throat and voice change.   Eyes: Negative for pain, redness and visual disturbance.  Respiratory: Negative for cough and shortness of breath.   Cardiovascular: Negative for chest pain and palpitations.  Gastrointestinal:  Positive for abdominal pain. Negative for abdominal distention, blood in stool, constipation, diarrhea, nausea and vomiting.  Genitourinary: Negative for dysuria, frequency, pelvic pain, vaginal bleeding, vaginal discharge and vaginal pain.  Musculoskeletal: Negative for arthralgias and myalgias.  Skin: Negative for rash and wound.  Neurological: Negative for syncope and headaches.     Physical Exam Triage Vital Signs ED Triage Vitals  Enc Vitals Group     BP 05/22/19 1603 122/83     Pulse Rate 05/22/19 1603 77     Resp 05/22/19 1603 16     Temp 05/22/19 1603 97.7 F (36.5 C)     Temp Source 05/22/19 1603 Oral     SpO2 05/22/19 1603 98 %     Weight --      Height --      Head Circumference --      Peak Flow --      Pain Score 05/22/19 1606 9     Pain Loc --      Pain Edu? --      Excl. in GC? --    No data found.  Updated Vital Signs BP 122/83 (BP Location: Left Arm)   Pulse 77   Temp 97.7 F (36.5 C) (Oral)   Resp 16   LMP 04/02/2019   SpO2 98%   Visual Acuity Right Eye Distance:   Left Eye Distance:   Bilateral Distance:    Right Eye Near:   Left Eye Near:    Bilateral Near:     Physical Exam Constitutional:      General: She is not in acute distress.    Appearance: She is normal weight. She is not ill-appearing.  HENT:     Head: Normocephalic and atraumatic.     Mouth/Throat:     Mouth: Mucous membranes are moist.     Pharynx: Oropharynx is clear.  Eyes:     General: No scleral icterus.    Pupils: Pupils are equal, round, and reactive to light.  Cardiovascular:     Rate and Rhythm: Normal rate and regular rhythm.     Heart sounds: No murmur. No gallop.   Pulmonary:     Effort: Pulmonary effort is normal. No respiratory distress.     Breath sounds: No wheezing.  Abdominal:     General: Abdomen is flat. Bowel sounds are normal. There is no distension.     Tenderness: There is no abdominal tenderness. There is no right CVA tenderness, left CVA  tenderness, guarding or rebound.     Hernia: No hernia is present.     Comments: Negative Murphy sign, negative McBurney sign.  Musculoskeletal:  Normal range of motion.     Right lower leg: No edema.     Left lower leg: No edema.  Skin:    General: Skin is warm.     Capillary Refill: Capillary refill takes less than 2 seconds.     Coloration: Skin is not jaundiced or pale.  Neurological:     General: No focal deficit present.     Mental Status: She is alert and oriented to person, place, and time.      UC Treatments / Results  Labs (all labs ordered are listed, but only abnormal results are displayed) Labs Reviewed  POCT URINE PREGNANCY - Normal    EKG   Radiology No results found.  Procedures Procedures (including critical care time)  Medications Ordered in UC Medications - No data to display  Initial Impression / Assessment and Plan / UC Course  I have reviewed the triage vital signs and the nursing notes.  Pertinent labs & imaging results that were available during my care of the patient were reviewed by me and considered in my medical decision making (see chart for details).     1.  Generalized abdominal pain Ongoing for 3 days, without focal findings on exam.  Patient hemodynamically stable, afebrile.  Given history of gallstones discussed that cholelithiasis could be contributory.  Discussed strict return precautions for the ER that would prompt sooner evaluation than what is currently scheduled per patient for cholecystectomy.  2.  Nausea without vomiting Urine pregnancy done in office, reviewed by me: Negative.  Will trial Zofran ODT.  Given patient's history, expect this to resolve over the next 1 to 2 days.  Return precautions discussed, patient verbalized understanding and is agreeable to plan. Final Clinical Impressions(s) / UC Diagnoses   Final diagnoses:  Generalized abdominal pain  Nausea without vomiting     Discharge Instructions     Let  Zofran dissolve under your tongue up to 3 times daily for nausea. Is very important to keep up oral hydration with water, may supplement with Gatorade/Powerade/vitamin water. Go to ER for further evaluation if you develop worsening pain, pinpoint abdominal pain, severe vomiting, blood in stool, fever.    ED Prescriptions    Medication Sig Dispense Auth. Provider   ondansetron (ZOFRAN ODT) 4 MG disintegrating tablet Take 1 tablet (4 mg total) by mouth every 8 (eight) hours as needed for nausea or vomiting. 20 tablet Hall-Potvin, [place], PA-C     PDMP not reviewed this encounter.   Hall-Potvin, [place], [place]-C 05/22/19 1713

## 2019-05-22 NOTE — Discharge Instructions (Signed)
Let Zofran dissolve under your tongue up to 3 times daily for nausea. Is very important to keep up oral hydration with water, may supplement with Gatorade/Powerade/vitamin water. Go to ER for further evaluation if you develop worsening pain, pinpoint abdominal pain, severe vomiting, blood in stool, fever.

## 2019-05-22 NOTE — ED Triage Notes (Signed)
Pt presents to UC w/ c/o abdominal pain since this morning. Pt states she gets this pain frequently and it comes and goes. Pt states she believes it comes most after she eats. Denies constipation.

## 2019-06-07 ENCOUNTER — Ambulatory Visit (INDEPENDENT_AMBULATORY_CARE_PROVIDER_SITE_OTHER): Payer: Medicaid Other | Admitting: Family Medicine

## 2019-06-07 ENCOUNTER — Other Ambulatory Visit: Payer: Self-pay

## 2019-06-07 VITALS — BP 110/78 | HR 69 | Wt 155.4 lb

## 2019-06-07 DIAGNOSIS — N926 Irregular menstruation, unspecified: Secondary | ICD-10-CM

## 2019-06-07 DIAGNOSIS — N63 Unspecified lump in unspecified breast: Secondary | ICD-10-CM | POA: Diagnosis present

## 2019-06-07 DIAGNOSIS — N6312 Unspecified lump in the right breast, upper inner quadrant: Secondary | ICD-10-CM

## 2019-06-07 DIAGNOSIS — Z23 Encounter for immunization: Secondary | ICD-10-CM

## 2019-06-07 LAB — POCT URINE PREGNANCY: Preg Test, Ur: NEGATIVE

## 2019-06-07 NOTE — Patient Instructions (Addendum)
Dear Frances [name],   It was good to see you! Thank you for taking your time to come in to be seen. Today, we discussed the following:   Breast nodule  All of the characteristics of the breast nodule are not concerning for cancer.  Given your inconsistent,  We did a urine pregnancy test.  Also, we will send you for a breast ultrasound to further characterize this nodule.  I will send you a message on my chart with normal results.  If there is anything abnormal, I will call you for further discussion of evaluation.  Birth Control   If you would like to discuss birth control options, please make an appointment and would be happy to discuss these things with you.   Thank you for getting your flu shot.  You are due for your Pap smear, please make an appointment to have this done.   Be well,   Zettie Cooley, M.D   [facility] [phone]  *Sign up for MyChart for instant access to your health profile, labs, orders, upcoming appointments or to contact your provider with questions*  ===================================================================================

## 2019-06-07 NOTE — Assessment & Plan Note (Signed)
Patient with smooth nodular mobile 1 cm circular breast mass at 10:00 on right breast.  Patient has noticed for 2 weeks but does not typically do self breast exams and does not know if it was there previously.  Patient has not had a consistent menstrual cycle since August.  Performed UPT today as she does not know if she could be pregnant.  Will obtain ultrasound of right breast.

## 2019-06-07 NOTE — Progress Notes (Signed)
  Subjective  CC: Lump on Breast (rt)  EPP:IRJJOACZ N Cranmer is a 23 y.o. female who presents today with the following problems: Right breast lump  Patient is a 23 year old G2, P2 with her most recent delivery in July 2020.  Patient reports that she noticed this breast nodule on the lateral right side of her breast 2 weeks ago.  She does not do monthly breast exams and does not know if this has been there for longer.  She is not currently breast-feeding although, her breasts do continue to leak.  Patient does not have any family history of breast cancer, ovarian cancer, cervical cancer.  Patient denies any bleeding, purulent discharge, rashes on her breast. Patient has not had consistent periods since giving birth in July. LMP was in August 2020 per patient.   Pertinent P/F/SHx: No significant medical history ROS: Pertinent ROS included in HPI. Objective  Physical Exam:  BP 110/78   Pulse 69   Wt 155 lb 6.4 oz (70.5 kg)   LMP 06/02/2019 (Approximate) Comment: more like spotting  SpO2 100%   BMI 24.34 kg/m  General: Patient is a young female, sitting on exam table comfortably, no acute distress Breast: Patient has large breasts with fibrous tissue.  Patient has a small 1 cm smooth circular nodule at about the 10 o'clock position.  Patient has a 2 mm circular nodule smooth and mobile at 12:00 more proximal to her nipple resting above fibrous breast tissue.  She does not have any pain with palpation.  She does not have any nodules in her axilla.  There are no rashes, erythema, bleeding.  She has some breast milk leakage with stimulation to the nipple.  Assessment & Plan    Problem List Items Addressed This Visit      Other   Breast nodule - Primary    Patient with smooth nodular mobile 1 cm circular breast mass at 10:00 on right breast.  Patient has noticed for 2 weeks but does not typically do self breast exams and does not know if it was there previously.  Patient has not had a consistent  menstrual cycle since August.  Performed UPT today as she does not know if she could be pregnant.  Will obtain ultrasound of right breast.      Relevant Orders   US BREAST COMPLETE UNI RIGHT INC AXILLA    Other Visit Diagnoses    Abnormal menstrual cycle       Relevant Orders   POCT urine pregnancy (Completed)     Health Maintenance Due  Topic Date Due  . INFLUENZA VACCINE  03/13/2019  . PAP-Cervical Cytology Screening  05/21/2019  . PAP SMEAR-Modifier  05/21/2019   Health Maintenance discussed with patient and patient agrees to address when able.   Wilber Oliphant, M.D.  PGY-2  Family Medicine  [phone] 06/07/2019 9:27 AM

## 2019-06-14 ENCOUNTER — Other Ambulatory Visit: Payer: Self-pay

## 2019-06-14 ENCOUNTER — Ambulatory Visit
Admission: RE | Admit: 2019-06-14 | Discharge: 2019-06-14 | Disposition: A | Discharge status: Home / Self Care | Payer: Medicaid Other | Source: Ambulatory Visit | Attending: Family Medicine | Admitting: Family Medicine

## 2019-06-14 DIAGNOSIS — N63 Unspecified lump in unspecified breast: Secondary | ICD-10-CM

## 2019-06-14 DIAGNOSIS — N6311 Unspecified lump in the right breast, upper outer quadrant: Secondary | ICD-10-CM | POA: Diagnosis not present

## 2019-06-23 ENCOUNTER — Ambulatory Visit: Payer: Medicaid Other | Admitting: Medical

## 2019-06-29 ENCOUNTER — Telehealth: Payer: Self-pay | Admitting: Student

## 2019-06-29 NOTE — Telephone Encounter (Signed)
Attempted to call patient about her appointment on 11/18 @ 10:35. No answer left voicemail instructing patient to wear a face mask for the entire appointment and no visitors are allowed during the visit. Patient instructed not to attend the appointment if she was any symptoms. Symptom list and office number left.

## 2019-06-30 ENCOUNTER — Ambulatory Visit: Payer: Medicaid Other | Admitting: Student

## 2019-08-21 ENCOUNTER — Other Ambulatory Visit: Payer: Self-pay

## 2019-08-21 ENCOUNTER — Ambulatory Visit
Admission: EM | Admit: 2019-08-21 | Discharge: 2019-08-21 | Disposition: A | Discharge status: Home / Self Care | Payer: Medicaid Other | Attending: Physician Assistant | Admitting: Physician Assistant

## 2019-08-21 ENCOUNTER — Encounter: Payer: Self-pay | Admitting: Emergency Medicine

## 2019-08-21 DIAGNOSIS — Z3202 Encounter for pregnancy test, result negative: Secondary | ICD-10-CM

## 2019-08-21 DIAGNOSIS — Z113 Encounter for screening for infections with a predominantly sexual mode of transmission: Secondary | ICD-10-CM

## 2019-08-21 DIAGNOSIS — R1084 Generalized abdominal pain: Secondary | ICD-10-CM | POA: Diagnosis not present

## 2019-08-21 LAB — POCT URINALYSIS DIP (MANUAL ENTRY)
Bilirubin, UA: NEGATIVE
Blood, UA: NEGATIVE
Glucose, UA: NEGATIVE mg/dL
Ketones, POC UA: NEGATIVE mg/dL
Leukocytes, UA: NEGATIVE
Nitrite, UA: NEGATIVE
Protein Ur, POC: NEGATIVE mg/dL
Spec Grav, UA: 1.02 (ref 1.010–1.025)
Urobilinogen, UA: 0.2 E.U./dL
pH, UA: 6 (ref 5.0–8.0)

## 2019-08-21 LAB — POCT URINE PREGNANCY: Preg Test, Ur: NEGATIVE

## 2019-08-21 MED ORDER — POLYETHYLENE GLYCOL 3350 17 G PO PACK: 17.0000 g | PACK | Freq: Every day | ORAL | 0 refills | Status: DC

## 2019-08-21 NOTE — ED Provider Notes (Signed)
[name]    CSN: [id number] Arrival date & time: 08/21/19  1326      History   Chief Complaint Chief Complaint  Patient presents with  . Abdominal Pain  . Back Pain    HPI Frances [name] is a 24 y.o. female.   24 year old female comes in for acute onset of generalized abdominal pain after waking up this morning.  States pain is constant, without obvious waxing or waning.  No obvious aggravating or alleviating factor.  Has not eaten today due to pain.  Denies nausea or vomiting.  Last BM this morning, with some straining.  Prior to that, patient has had a few days without bowel movement.  Denies urinary symptoms such as frequency, dysuria, hematuria.  Denies vaginal discharge, itching, pain.  Denies fever, chills, body aches.  LMP 08/05/2019.  Sexually active with one female partner without condom use.  Patient also commented that she has left lower back pain that is chronic in nature, denies any worsening.      Past Medical History:  Diagnosis Date  . Anemia    on Iron supplements  . Asthma 1999   as a child; no current problems  . Chronic kidney disease    as a child  . Constipation   . Gall stones   . Gallstones 10/23/2016   10/2016: Seen by [facility], pt states, and plans for PP follow up. Advised her on low fat diet  . Seizures (HCC)    seizure disorder as a child, treated with medication; none since  . UTI (lower urinary tract infection)     Patient Active Problem List   Diagnosis Date Noted  . Breast nodule 06/07/2019  . History of asthma 08/18/2018  . History of seizure disorder 08/18/2018  . History of kidney disease as a child 08/18/2018  . Gallstones 10/23/2016  . History of recurrent UTIs 09/16/2016    Past Surgical History:  Procedure Laterality Date  . IRRIGATION AND DEBRIDEMENT ABSCESS Left 06/11/2017   Procedure: IRRIGATION, DRAINAGE AND DEBRIDEMENT LEFT FLANK NECROTIZING SOFT TISSUE INFECTION;  Surgeon: Avel Peace, MD;  Location: [facility];  Service: General;  Laterality: Left;  . TYMPANOSTOMY TUBE PLACEMENT    . WOUND DEBRIDEMENT Left 06/12/2017   Procedure: DRESSING CHANGE AND DEBRIDEMENT OF WOUND;  Surgeon: Avel Peace, MD;  Location: [facility]S;  Service: General;  Laterality: Left;    OB History    Gravida  2   Para  2   Term  2   Preterm  0   AB  0   Living  2     SAB  0   TAB  0   Ectopic  0   Multiple  0   Live Births  2            Home Medications    Prior to Admission medications   Medication Sig Start Date End Date Taking? Authorizing Provider  ibuprofen (ADVIL) 600 MG tablet Take 1 tablet (600 mg total) by mouth every 6 (six) hours. 02/24/19   Arvilla Market, DO  ondansetron (ZOFRAN ODT) 4 MG disintegrating tablet Take 1 tablet (4 mg total) by mouth every 8 (eight) hours as needed for nausea or vomiting. 05/22/19   Hall-Potvin, [place], PA-C  polyethylene glycol (MIRALAX) 17 g packet Take 17 g by mouth daily. 08/21/19   Belinda Fisher, PA-C  Prenatal Vit-Fe Fumarate-FA (PRENATAL COMPLETE) 14-0.4 MG TABS Take 1 tablet by mouth daily. 06/22/18   Linus Mako  B, NP    Family History Family History  Problem Relation Age of Onset  . Asthma Mother   . Hypertension Mother   . Asthma Sister   . Birth defects Sister        Down's syndrome  . Intellectual disability Father     Social History Social History   Tobacco Use  . Smoking status: Never Smoker  . Smokeless tobacco: Never Used  Substance Use Topics  . Alcohol use: No  . Drug use: Not Currently    Frequency: 2.0 times per week    Types: Marijuana    Comment: last used 05/2018     Allergies   Vancomycin   Review of Systems Review of Systems  Reason unable to perform ROS: See HPI as above.     Physical Exam Triage Vital Signs ED Triage Vitals  Enc Vitals Group     BP 08/21/19 1336 112/79     Pulse Rate 08/21/19 1336 88     Resp 08/21/19 1336 16     Temp 08/21/19 1336 97.9 F (36.6 C)     Temp  Source 08/21/19 1336 Temporal     SpO2 08/21/19 1336 100 %     Weight --      Height --      Head Circumference --      Peak Flow --      Pain Score 08/21/19 1337 0     Pain Loc --      Pain Edu? --      Excl. in [place]? --    No data found.  Updated Vital Signs BP 112/79 (BP Location: Left Arm)   Pulse 88   Temp 97.9 F (36.6 C) (Temporal)   Resp 16   LMP 08/05/2019   SpO2 100%   Visual Acuity Right Eye Distance:   Left Eye Distance:   Bilateral Distance:    Right Eye Near:   Left Eye Near:    Bilateral Near:     Physical Exam Constitutional:      General: She is not in acute distress.    Appearance: She is well-developed. She is not ill-appearing, toxic-appearing or diaphoretic.  HENT:     Head: Normocephalic and atraumatic.  Eyes:     Conjunctiva/sclera: Conjunctivae normal.     Pupils: Pupils are equal, round, and reactive to light.  Cardiovascular:     Rate and Rhythm: Normal rate and regular rhythm.  Pulmonary:     Effort: Pulmonary effort is normal.     Breath sounds: Normal breath sounds. No wheezing or rales.  Abdominal:     General: Bowel sounds are normal.     Palpations: Abdomen is soft.     Tenderness: There is no abdominal tenderness. There is no right CVA tenderness, left CVA tenderness, guarding or rebound.  Musculoskeletal:     Cervical back: Normal range of motion and neck supple.  Skin:    General: Skin is warm and dry.  Neurological:     Mental Status: She is alert and oriented to person, place, and time.  Psychiatric:        Behavior: Behavior normal.        Judgment: Judgment normal.     UC Treatments / Results  Labs (all labs ordered are listed, but only abnormal results are displayed) Labs Reviewed  POCT URINALYSIS DIP (MANUAL ENTRY)  POCT URINE PREGNANCY  CERVICOVAGINAL ANCILLARY ONLY    EKG   Radiology No results found.  Procedures Procedures (  including critical care time)  Medications Ordered in UC Medications - No  data to display  Initial Impression / Assessment and Plan / UC Course  I have reviewed the triage vital signs and the nursing notes.  Pertinent labs & imaging results that were available during my care of the patient were reviewed by me and considered in my medical decision making (see chart for details).    No alarming signs on exam.  Urine negative for pregnancy, infection.  Abdomen is soft to palpation without tenderness, guarding or rebound.  Will treat for possible constipation causing symptoms with MiraLAX.  Push fluids.  Cytology ordered, patient will be contacted with any positive results.  Return precautions given.  Patient expresses understanding and agrees to plan.  Final Clinical Impressions(s) / UC Diagnoses   Final diagnoses:  Generalized abdominal pain  Screen for STD (sexually transmitted disease)   ED Prescriptions    Medication Sig Dispense Auth. Provider   polyethylene glycol (MIRALAX) 17 g packet Take 17 g by mouth daily. 14 each Belinda Fisher, PA-C     PDMP not reviewed this encounter.   Belinda Fisher, PA-C 08/21/19 1442

## 2019-08-21 NOTE — Discharge Instructions (Signed)
No alarming signs on exam.  Urine negative for infection, pregnancy.  Start MiraLAX as directed for possible constipation causing symptoms.  Stay hydrated, urine should be clear to pale yellow in color.  Cytology sent, you will be contacted with any positive results.  Refrain from sexual activity until results return.  As discussed, please follow-up with PCP/OB/GYN for further evaluation if continues with abdominal pain.  If any worsening symptoms, unable to walk/jump due to abdominal pain, nausea/vomiting, fever, go to the emergency department for further evaluation.

## 2019-08-21 NOTE — ED Triage Notes (Signed)
Pt presents to [facility] for assessment of left lower back pain, which is chronic in nature after patient had a cyst removed from that side surgically in 2018.  Patient states it comes and goes, but has been bothering her "for a while now".  Pt states generalized abdominal pain upon waking up this morning.  Denies n/v/d.  LBM this morning, normal per patient.  Denies fevers.  Pt denies changes in urination.  Denies vaginal discharge.

## 2019-08-24 LAB — CERVICOVAGINAL ANCILLARY ONLY
Chlamydia: NEGATIVE
Neisseria Gonorrhea: NEGATIVE
Trichomonas: NEGATIVE

## 2019-10-27 ENCOUNTER — Other Ambulatory Visit (HOSPITAL_COMMUNITY)
Admission: RE | Admit: 2019-10-27 | Discharge: 2019-10-27 | Disposition: A | Discharge status: Home / Self Care | Payer: Medicaid Other | Source: Ambulatory Visit | Attending: Family Medicine | Admitting: Family Medicine

## 2019-10-27 ENCOUNTER — Encounter: Payer: Self-pay | Admitting: Family Medicine

## 2019-10-27 ENCOUNTER — Other Ambulatory Visit: Payer: Self-pay

## 2019-10-27 ENCOUNTER — Ambulatory Visit: Payer: Medicaid Other | Admitting: Family Medicine

## 2019-10-27 VITALS — BP 98/66 | HR 90 | Ht 67.0 in | Wt 144.0 lb

## 2019-10-27 DIAGNOSIS — D649 Anemia, unspecified: Secondary | ICD-10-CM

## 2019-10-27 DIAGNOSIS — Z124 Encounter for screening for malignant neoplasm of cervix: Secondary | ICD-10-CM

## 2019-10-27 DIAGNOSIS — Z3200 Encounter for pregnancy test, result unknown: Secondary | ICD-10-CM

## 2019-10-27 LAB — POCT URINE PREGNANCY: Preg Test, Ur: NEGATIVE

## 2019-10-27 NOTE — Patient Instructions (Addendum)
It was a pleasure to see you today! Thank you for choosing [facility] for your primary care. Frances [name] was seen for physical and Pap smear. Come back to the clinic if there is anything we can do for you.  Today we did a Pap smear and a blood test to try and check for anemia, he will see the results pop up in your MyChart but I will send you message once they will come back in.  Please remember if you wish to avoid pregnancy that it is important to maintain form of birth control.  After looking through your chart I do not think that there is a likelihood that you have any significant heart problem because your last EKGs have looked very well.  If you get any significant worsening of this chest pain issue please reach out to [place] immediately.   Please bring all your medications to every doctors visit   Sign up for My Chart to have easy access to your labs results, and communication with your Primary care physician.     Please check-out at the front desk before leaving the clinic.     Best,  Dr. Marthenia Rolling FAMILY MEDICINE RESIDENT - PGY3 10/27/2019 2:06 PM

## 2019-10-28 LAB — CBC
Hematocrit: 37.6 % (ref 34.0–46.6)
Hemoglobin: 12.3 g/dL (ref 11.1–15.9)
MCH: 30 pg (ref 26.6–33.0)
MCHC: 32.7 g/dL (ref 31.5–35.7)
MCV: 92 fL (ref 79–97)
Platelets: 311 10*3/uL (ref 150–450)
RBC: 4.1 x10E6/uL (ref 3.77–5.28)
RDW: 12.9 % (ref 11.7–15.4)
WBC: 3.1 10*3/uL — ABNORMAL LOW (ref 3.4–10.8)

## 2019-10-28 LAB — CYTOLOGY - PAP
Chlamydia: NEGATIVE
Comment: NEGATIVE
Comment: NEGATIVE
Comment: NORMAL
Diagnosis: NEGATIVE
Neisseria Gonorrhea: NEGATIVE
Trichomonas: NEGATIVE

## 2019-10-31 DIAGNOSIS — Z3200 Encounter for pregnancy test, result unknown: Secondary | ICD-10-CM | POA: Insufficient documentation

## 2019-10-31 DIAGNOSIS — D649 Anemia, unspecified: Secondary | ICD-10-CM | POA: Insufficient documentation

## 2019-10-31 DIAGNOSIS — Z124 Encounter for screening for malignant neoplasm of cervix: Secondary | ICD-10-CM | POA: Insufficient documentation

## 2019-10-31 NOTE — Assessment & Plan Note (Signed)
Patient has been sexually active with female, does not have hormonal birth control, declines to start at this time

## 2019-10-31 NOTE — Assessment & Plan Note (Signed)
Pap and all associated STD screening is negative

## 2019-10-31 NOTE — Progress Notes (Signed)
    SUBJECTIVE:   CHIEF COMPLAINT / HPI:   Patient does have history of anemia, she says that she was on iron at 1 point but has not been lately.  She denies any symptoms of anemia and any bleeding beyond menses which she says might be slightly heavy.  No weakness at this point but does consent to do CBC  Patient would like to get a Pap smear and consents to STD testing as well, she says that she is not on hormonal birth control and still wishes to wait on that decision at the moment.  PERTINENT  PMH / PSH:   OBJECTIVE:   BP 98/66   Pulse 90   Ht 5\' 7"  (1.702 m)   Wt 144 lb (65.3 kg)   LMP 10/16/2019 (Exact Date)   SpO2 100%   BMI 22.55 kg/m   General: Pleasant alert, appropriate body weight Respiratory: No cough, no work of breathing, no wheeze Cardiac: Regular rate and rhythm, no murmur GU: *Sensitive exam performed entirely with CMA in the room*no pathologic lesions visualized externally, no bleeding or pathologic appearing discharge, cervix appears normal.  ASSESSMENT/PLAN:   Encounter for pregnancy test, result unknown Patient has been sexually active with female, does not have hormonal birth control, declines to start at this time  Anemia History of anemia, no concerning symptoms  CBC within normal limits  Cervical cancer screening Pap and all associated STD screening is negative     12/16/2019, DO [facility]

## 2019-10-31 NOTE — Assessment & Plan Note (Signed)
History of anemia, no concerning symptoms  CBC within normal limits

## 2019-12-11 ENCOUNTER — Emergency Department (HOSPITAL_COMMUNITY): Payer: Medicaid Other

## 2019-12-11 ENCOUNTER — Emergency Department (HOSPITAL_COMMUNITY)
Admission: EM | Admit: 2019-12-11 | Discharge: 2019-12-11 | Disposition: A | Discharge status: Home / Self Care | Payer: Medicaid Other | Attending: Emergency Medicine | Admitting: Emergency Medicine

## 2019-12-11 ENCOUNTER — Encounter (HOSPITAL_COMMUNITY): Payer: Self-pay | Admitting: Emergency Medicine

## 2019-12-11 ENCOUNTER — Other Ambulatory Visit: Payer: Self-pay

## 2019-12-11 ENCOUNTER — Other Ambulatory Visit (HOSPITAL_COMMUNITY): Payer: Medicaid Other

## 2019-12-11 DIAGNOSIS — J45909 Unspecified asthma, uncomplicated: Secondary | ICD-10-CM | POA: Insufficient documentation

## 2019-12-11 DIAGNOSIS — R109 Unspecified abdominal pain: Secondary | ICD-10-CM

## 2019-12-11 DIAGNOSIS — Z79899 Other long term (current) drug therapy: Secondary | ICD-10-CM | POA: Insufficient documentation

## 2019-12-11 DIAGNOSIS — K8020 Calculus of gallbladder without cholecystitis without obstruction: Secondary | ICD-10-CM | POA: Insufficient documentation

## 2019-12-11 DIAGNOSIS — N189 Chronic kidney disease, unspecified: Secondary | ICD-10-CM | POA: Insufficient documentation

## 2019-12-11 DIAGNOSIS — R1010 Upper abdominal pain, unspecified: Secondary | ICD-10-CM

## 2019-12-11 LAB — URINALYSIS, ROUTINE W REFLEX MICROSCOPIC
Bilirubin Urine: NEGATIVE
Glucose, UA: NEGATIVE mg/dL
Hgb urine dipstick: NEGATIVE
Ketones, ur: NEGATIVE mg/dL
Leukocytes,Ua: NEGATIVE
Nitrite: NEGATIVE
Protein, ur: NEGATIVE mg/dL
Specific Gravity, Urine: 1.018 (ref 1.005–1.030)
pH: 8 (ref 5.0–8.0)

## 2019-12-11 LAB — CBC
HCT: 34.7 % — ABNORMAL LOW (ref 36.0–46.0)
Hemoglobin: 11.5 g/dL — ABNORMAL LOW (ref 12.0–15.0)
MCH: 30.3 pg (ref 26.0–34.0)
MCHC: 33.1 g/dL (ref 30.0–36.0)
MCV: 91.3 fL (ref 80.0–100.0)
Platelets: 280 10*3/uL (ref 150–400)
RBC: 3.8 MIL/uL — ABNORMAL LOW (ref 3.87–5.11)
RDW: 12 % (ref 11.5–15.5)
WBC: 4.4 10*3/uL (ref 4.0–10.5)
nRBC: 0 % (ref 0.0–0.2)

## 2019-12-11 LAB — COMPREHENSIVE METABOLIC PANEL
ALT: 13 U/L (ref 0–44)
AST: 18 U/L (ref 15–41)
Albumin: 3.9 g/dL (ref 3.5–5.0)
Alkaline Phosphatase: 48 U/L (ref 38–126)
Anion gap: 8 (ref 5–15)
BUN: 6 mg/dL (ref 6–20)
CO2: 24 mmol/L (ref 22–32)
Calcium: 9.2 mg/dL (ref 8.9–10.3)
Chloride: 106 mmol/L (ref 98–111)
Creatinine, Ser: 0.6 mg/dL (ref 0.44–1.00)
GFR calc Af Amer: >60 mL/min (ref >60)
GFR calc non Af Amer: >60 mL/min (ref >60)
Glucose, Bld: 92 mg/dL (ref 70–99)
Potassium: 3.7 mmol/L (ref 3.5–5.1)
Sodium: 138 mmol/L (ref 135–145)
Total Bilirubin: 0.5 mg/dL (ref 0.3–1.2)
Total Protein: 6.9 g/dL (ref 6.5–8.1)

## 2019-12-11 LAB — I-STAT BETA HCG BLOOD, ED (MC, WL, AP ONLY): I-stat hCG, quantitative: <5 m[IU]/mL (ref <5)

## 2019-12-11 LAB — LIPASE, BLOOD: Lipase: 24 U/L (ref 11–51)

## 2019-12-11 MED ORDER — SODIUM CHLORIDE 0.9% FLUSH: 3.0000 mL | Freq: Once | INTRAVENOUS | Status: DC

## 2019-12-11 MED ORDER — OXYCODONE-ACETAMINOPHEN 5-325 MG PO TABS: 1.0000 | ORAL_TABLET | ORAL | 0 refills | Status: DC | PRN

## 2019-12-11 MED ORDER — MORPHINE SULFATE (PF) 4 MG/ML IV SOLN
4.0000 mg | Freq: Once | INTRAVENOUS | Status: AC
  Administered 2019-12-11: 4 mg via INTRAVENOUS
  Filled 2019-12-11: qty 1

## 2019-12-11 MED ORDER — ONDANSETRON HCL 4 MG PO TABS: 4.0000 mg | ORAL_TABLET | Freq: Three times a day (TID) | ORAL | 0 refills | Status: DC | PRN

## 2019-12-11 MED ORDER — ONDANSETRON HCL 4 MG/2ML IJ SOLN
4.0000 mg | Freq: Once | INTRAMUSCULAR | Status: AC
  Administered 2019-12-11: 4 mg via INTRAVENOUS
  Filled 2019-12-11: qty 2

## 2019-12-11 NOTE — Discharge Instructions (Signed)
Your work-up today is consistent with symptomatic cholelithiasis meaning you are having pain from your known gallstones but no acute cholecystitis.  As you are feeling better and your lab work was reassuring, we feel you are safe for discharge home.  Please use the pain medicine nausea medicine and rest and stay hydrated.  Please follow-up with the general surgery team for outpatient management.  If any symptoms change or worsen or you get or you are getting dehydrated, please return to nearest emergency department immediately.

## 2019-12-11 NOTE — ED Provider Notes (Signed)
[facility] EMERGENCY DEPARTMENT Provider Note   CSN: [id number] Arrival date & time: 12/11/19  1884     History Chief Complaint  Patient presents with  . Abdominal Pain    Frances [name] is a 24 y.o. female.  The history is provided by the patient and medical records. No language interpreter was used.  Abdominal Pain Pain location:  Generalized Pain quality: aching and cramping   Pain radiates to:  Does not radiate Pain severity:  Severe Onset quality:  Gradual Duration:  2 days Timing:  Constant Progression:  Waxing and waning Chronicity:  New Context: not previous surgeries and not trauma   Worsened by:  Palpation and eating Ineffective treatments:  None tried Associated symptoms: nausea and vomiting   Associated symptoms: no chest pain, no chills, no constipation, no cough, no diarrhea, no dysuria, no fatigue, no fever, no flatus, no hematemesis, no hematochezia, no hematuria, no melena, no shortness of breath, no vaginal bleeding and no vaginal discharge        Past Medical History:  Diagnosis Date  . Anemia    on Iron supplements  . Asthma 1999   as a child; no current problems  . Chronic kidney disease    as a child  . Constipation   . Gall stones   . Gallstones 10/23/2016   10/2016: Seen by [facility], pt states, and plans for PP follow up. Advised her on low fat diet  . Seizures ([place])    seizure disorder as a child, treated with medication; none since  . UTI (lower urinary tract infection)     Patient Active Problem List   Diagnosis Date Noted  . Cervical cancer screening 10/31/2019  . Anemia 10/31/2019  . Encounter for pregnancy test, result unknown 10/31/2019  . Breast nodule 06/07/2019  . History of asthma 08/18/2018  . History of seizure disorder 08/18/2018  . History of kidney disease as a child 08/18/2018  . Gallstones 10/23/2016  . History of recurrent UTIs 09/16/2016    Past Surgical History:  Procedure Laterality Date  .  IRRIGATION AND DEBRIDEMENT ABSCESS Left 06/11/2017   Procedure: IRRIGATION, DRAINAGE AND DEBRIDEMENT LEFT FLANK NECROTIZING SOFT TISSUE INFECTION;  Surgeon: Jackolyn Confer, MD;  Location: [facility];  Service: General;  Laterality: Left;  . TYMPANOSTOMY TUBE PLACEMENT    . WOUND DEBRIDEMENT Left 06/12/2017   Procedure: DRESSING CHANGE AND DEBRIDEMENT OF WOUND;  Surgeon: Jackolyn Confer, MD;  Location: [facility];  Service: General;  Laterality: Left;     OB History    Gravida  2   Para  2   Term  2   Preterm  0   AB  0   Living  2     SAB  0   TAB  0   Ectopic  0   Multiple  0   Live Births  2           Family History  Problem Relation Age of Onset  . Asthma Mother   . Hypertension Mother   . Asthma Sister   . Birth defects Sister        Down's syndrome  . Intellectual disability Father     Social History   Tobacco Use  . Smoking status: Never Smoker  . Smokeless tobacco: Never Used  Substance Use Topics  . Alcohol use: No  . Drug use: Not Currently    Frequency: 2.0 times per week    Types: Marijuana    Comment: last  used 05/2018    Home Medications Prior to Admission medications   Medication Sig Start Date End Date Taking? Authorizing Provider  ibuprofen (ADVIL) 600 MG tablet Take 1 tablet (600 mg total) by mouth every 6 (six) hours. 02/24/19   Arvilla Market, DO  ondansetron (ZOFRAN ODT) 4 MG disintegrating tablet Take 1 tablet (4 mg total) by mouth every 8 (eight) hours as needed for nausea or vomiting. 05/22/19   Hall-Potvin, [place], PA-C  polyethylene glycol (MIRALAX) 17 g packet Take 17 g by mouth daily. 08/21/19   Belinda Fisher, PA-C  Prenatal Vit-Fe Fumarate-FA (PRENATAL COMPLETE) 14-0.4 MG TABS Take 1 tablet by mouth daily. 06/22/18   Georgetta Haber, NP    Allergies    Vancomycin  Review of Systems   Review of Systems  Constitutional: Negative for chills, diaphoresis, fatigue and fever.  HENT: Negative for congestion.     Respiratory: Negative for cough, chest tightness, shortness of breath and wheezing.   Cardiovascular: Negative for chest pain, palpitations and leg swelling.  Gastrointestinal: Positive for abdominal pain, nausea and vomiting. Negative for abdominal distention, blood in stool, constipation, diarrhea, flatus, hematemesis, hematochezia, melena and rectal pain.  Genitourinary: Negative for decreased urine volume, difficulty urinating, dysuria, flank pain, frequency, hematuria, vaginal bleeding, vaginal discharge and vaginal pain.  Musculoskeletal: Negative for back pain, neck pain and neck stiffness.  Neurological: Negative for dizziness, light-headedness and headaches.  Psychiatric/Behavioral: Negative for agitation.  All other systems reviewed and are negative.   Physical Exam Updated Vital Signs BP 113/76 (BP Location: Left Arm)   Pulse 93   Temp 98.2 F (36.8 C) (Oral)   Resp 16   Ht 5\' 7"  (1.702 m)   Wt 65.3 kg   LMP 11/22/2019   SpO2 100%   BMI 22.55 kg/m   Physical Exam Vitals and nursing note reviewed.  Constitutional:      General: She is not in acute distress.    Appearance: She is well-developed. She is not ill-appearing, toxic-appearing or diaphoretic.  HENT:     Head: Normocephalic and atraumatic.     Right Ear: External ear normal.     Left Ear: External ear normal.     Nose: Nose normal.     Mouth/Throat:     Pharynx: No oropharyngeal exudate.  Eyes:     Conjunctiva/sclera: Conjunctivae normal.     Pupils: Pupils are equal, round, and reactive to light.  Cardiovascular:     Rate and Rhythm: Normal rate.     Heart sounds: Normal heart sounds. No murmur.  Pulmonary:     Effort: Pulmonary effort is normal. No respiratory distress.     Breath sounds: No stridor.  Abdominal:     General: Abdomen is flat. Bowel sounds are normal. There is no distension.     Palpations: Abdomen is soft.     Tenderness: There is abdominal tenderness (very mild upper). There is no  right CVA tenderness, left CVA tenderness, guarding or rebound.  Musculoskeletal:     Cervical back: Normal range of motion and neck supple.  Skin:    General: Skin is warm.     Findings: No erythema or rash.  Neurological:     Mental Status: She is alert and oriented to person, place, and time.     Motor: No abnormal muscle tone.     Coordination: Coordination normal.     Deep Tendon Reflexes: Reflexes are normal and symmetric.  Psychiatric:  Mood and Affect: Mood normal.     ED Results / Procedures / Treatments   Labs (all labs ordered are listed, but only abnormal results are displayed) Labs Reviewed  CBC - Abnormal; Notable for the following components:      Result Value   RBC 3.80 (*)    Hemoglobin 11.5 (*)    HCT 34.7 (*)    All other components within normal limits  URINALYSIS, ROUTINE W REFLEX MICROSCOPIC - Abnormal; Notable for the following components:   APPearance HAZY (*)    All other components within normal limits  LIPASE, BLOOD  COMPREHENSIVE METABOLIC PANEL  I-STAT BETA HCG BLOOD, ED ([facility], [facility], [facility] ONLY)    EKG None  Radiology US Abdomen Limited RUQ  Result Date: 12/11/2019 CLINICAL DATA:  RIGHT upper quadrant abdominal pain, nausea, and vomiting for 1 day, known cholelithiasis EXAM: ULTRASOUND ABDOMEN LIMITED RIGHT UPPER QUADRANT COMPARISON:  CT abdomen and pelvis 05/01/2018, ultrasound abdomen 10/15/2016 FINDINGS: Gallbladder: 15 mm gallstone at gallbladder fundus. Non mobile 18 mm gallstone at gallbladder neck. Gallbladder wall upper normal in thickness. No pericholecystic fluid. Unable to assess for sonographic Murphy sign due to medication administration. Common bile duct: Diameter: 3 mm, normal Liver: Normal echogenicity without mass or nodularity. No intrahepatic biliary dilatation. Portal vein is patent on color Doppler imaging with normal direction of blood flow towards the liver. Other: No RIGHT upper quadrant free fluid. IMPRESSION: Cholelithiasis  including an 18 mm non mobile gallstone at gallbladder neck. No definite sonographic signs of acute cholecystitis or biliary dilatation. Electronically Signed   By: Ulyses Southward M.D.   On: 12/11/2019 12:36    Procedures Procedures (including critical care time)  Medications Ordered in ED Medications  sodium chloride flush (NS) 0.9 % injection 3 mL (3 mLs Intravenous Not Given 12/11/19 1414)  ondansetron (ZOFRAN) injection 4 mg (4 mg Intravenous Given 12/11/19 1048)  morphine 4 MG/ML injection 4 mg (4 mg Intravenous Given 12/11/19 1049)    ED Course  I have reviewed the triage vital signs and the nursing notes.  Pertinent labs & imaging results that were available during my care of the patient were reviewed by me and considered in my medical decision making (see chart for details).    MDM Rules/Calculators/A&P                      Janila Arrazola is a 24 y.o. female with a past medical history significant for gallstones who presents with nausea, vomiting, and abdominal pain for the last 2 days.  Patient reports that since yesterday, she has had pain across her abdomen but worse in the upper abdomen.  She reports nausea vomiting yesterday but continued nausea today.  She reports she had a small bowel movement yesterday that was relatively normal.  She denies any vaginal symptoms or pelvic symptoms.  No vaginal bleeding or vaginal discharge.  No urinary symptoms including no dysuria, hematuria, frequency, or urgency.  No recent trauma.  She reports her abdominal pain is severe and is constant but waxing and waning in severity.  She has not eaten because she thought it made it feel worse.  She denies any fevers, chills, chest pain, shortness of breath, or cough.  No recent Covid exposures.  She reports no other complaints.  On exam, lungs are clear and chest is nontender.  Abdomen is tender mildly in the upper abdomen.  Bowel sounds were appreciated.  No CVA or flank tenderness.  No back tenderness.  Good pulses in lower extremities with normal sensation and strength in legs.  Patient otherwise resting comfortably.  Patient had labs in triage showing reassuring CBC CMP and lipase.  She is not pregnant.  Urinalysis does not show infection.  Given her known gallstones in the upper abdominal discomfort with nausea and vomiting, will get ultrasound to rule out an acute cholecystitis.  With her lack of diffuse tenderness and otherwise reassuring labs, we agreed to hold on CT imaging at this time.  If she is feeling better after medications, has reassuring ultrasound, and is able to pass a p.o. challenge, she may be appropriate for discharge home today with outpatient follow-up.  Anticipate reassessment after work-up.  Laboratory testing was reassuring.  She is not pregnant.  Ultrasound shows large gallstone but no evidence of acute cholecystitis.  Possible symptomatic cholelithiasis contributing to symptoms but do not feel she needs admission as she was able to tolerate p.o. in the emergency department.  Patient is amenable to pain medicine, nausea medicine, work note, and instructions to follow-up with outpatient general surgery for possible symptomatic cholelithiasis causing her symptoms.  She agrees with discharge and was discharged in good condition with improved symptoms and reassuring work-up.   Final Clinical Impression(s) / ED Diagnoses Final diagnoses:  Abdominal pain  Pain of upper abdomen  Calculus of gallbladder without cholecystitis without obstruction    Rx / DC Orders ED Discharge Orders         Ordered    oxyCODONE-acetaminophen (PERCOCET/ROXICET) 5-325 MG tablet  Every 4 hours PRN     12/11/19 1502    ondansetron (ZOFRAN) 4 MG tablet  Every 8 hours PRN     12/11/19 1502         Clinical Impression: 1. Pain of upper abdomen   2. Abdominal pain   3. Calculus of gallbladder without cholecystitis without obstruction     Disposition: Discharge  Condition: Good  I  have discussed the results, Dx and Tx plan with the pt(& family if present). He/she/they expressed understanding and agree(s) with the plan. Discharge instructions discussed at great length. Strict return precautions discussed and pt &/or family have verbalized understanding of the instructions. No further questions at time of discharge.    New Prescriptions   ONDANSETRON (ZOFRAN) 4 MG TABLET    Take 1 tablet (4 mg total) by mouth every 8 (eight) hours as needed.   OXYCODONE-ACETAMINOPHEN (PERCOCET/ROXICET) 5-325 MG TABLET    Take 1 tablet by mouth every 4 (four) hours as needed for severe pain (synptomatic gallstones).    Follow Up: Surgery, [facility] [address] [place] [address] [phone]     [facility] EMERGENCY DEPARTMENT [address] [id number] mc [place] [address] [phone]       Zorian Gunderman, Canary Brim, MD 12/11/19 [phone]

## 2019-12-11 NOTE — ED Triage Notes (Signed)
C/o generalized dull abd pain since yesterday.  Vomited x 1 yesterday.  Denies nausea, diarrhea, constipation, and urinary complaint.

## 2019-12-15 ENCOUNTER — Other Ambulatory Visit: Payer: Self-pay

## 2019-12-15 ENCOUNTER — Emergency Department (HOSPITAL_COMMUNITY)
Admission: EM | Admit: 2019-12-15 | Discharge: 2019-12-15 | Disposition: A | Discharge status: Home / Self Care | Payer: Medicaid Other | Attending: Emergency Medicine | Admitting: Emergency Medicine

## 2019-12-15 ENCOUNTER — Encounter (HOSPITAL_COMMUNITY): Payer: Self-pay

## 2019-12-15 DIAGNOSIS — Z79899 Other long term (current) drug therapy: Secondary | ICD-10-CM | POA: Insufficient documentation

## 2019-12-15 DIAGNOSIS — M62838 Other muscle spasm: Secondary | ICD-10-CM | POA: Insufficient documentation

## 2019-12-15 DIAGNOSIS — M542 Cervicalgia: Secondary | ICD-10-CM | POA: Insufficient documentation

## 2019-12-15 MED ORDER — METHOCARBAMOL 500 MG PO TABS
500.0000 mg | ORAL_TABLET | Freq: Two times a day (BID) | ORAL | 0 refills | Status: DC
Start: 2019-12-15 — End: 2020-03-14

## 2019-12-15 MED ORDER — NAPROXEN 500 MG PO TABS
500.0000 mg | ORAL_TABLET | Freq: Two times a day (BID) | ORAL | 0 refills | Status: DC
Start: 2019-12-15 — End: 2020-02-02

## 2019-12-15 NOTE — ED Provider Notes (Signed)
[facility]-EMERGENCY DEPT Provider Note   CSN: [id number] Arrival date & time: 12/15/19  2147     History Chief Complaint  Patient presents with  . Neck Pain    Frances [name] is a 24 y.o. female who presents to the ED today with complaint of gradual onset, intermittent, left sided neck pain that began earlier today while at work. Pt reports the pain subsided without intervention but then returned. She has not taken anything for her symptoms. She decided to come to the ED when the pain returned for evaluation. PT reports the pain goes up into the side of her head causing a headache however she has frequent headaches that feel similar. Pt denies vision changes, neck stiffness, rash, unilateral weakness or numbness, speech difficulties, confusion, or any other associated symptoms.   The history is provided by the patient and medical records.       Past Medical History:  Diagnosis Date  . Anemia    on Iron supplements  . Asthma 1999   as a child; no current problems  . Chronic kidney disease    as a child  . Constipation   . Gall stones   . Gallstones 10/23/2016   10/2016: Seen by [facility], pt states, and plans for PP follow up. Advised her on low fat diet  . Seizures (HCC)    seizure disorder as a child, treated with medication; none since  . UTI (lower urinary tract infection)     Patient Active Problem List   Diagnosis Date Noted  . Cervical cancer screening 10/31/2019  . Anemia 10/31/2019  . Encounter for pregnancy test, result unknown 10/31/2019  . Breast nodule 06/07/2019  . History of asthma 08/18/2018  . History of seizure disorder 08/18/2018  . History of kidney disease as a child 08/18/2018  . Gallstones 10/23/2016  . History of recurrent UTIs 09/16/2016    Past Surgical History:  Procedure Laterality Date  . IRRIGATION AND DEBRIDEMENT ABSCESS Left 06/11/2017   Procedure: IRRIGATION, DRAINAGE AND DEBRIDEMENT LEFT FLANK NECROTIZING SOFT TISSUE  INFECTION;  Surgeon: Avel Peace, MD;  Location: [facility];  Service: General;  Laterality: Left;  . TYMPANOSTOMY TUBE PLACEMENT    . WOUND DEBRIDEMENT Left 06/12/2017   Procedure: DRESSING CHANGE AND DEBRIDEMENT OF WOUND;  Surgeon: Avel Peace, MD;  Location: [facility];  Service: General;  Laterality: Left;     OB History    Gravida  2   Para  2   Term  2   Preterm  0   AB  0   Living  2     SAB  0   TAB  0   Ectopic  0   Multiple  0   Live Births  2           Family History  Problem Relation Age of Onset  . Asthma Mother   . Hypertension Mother   . Asthma Sister   . Birth defects Sister        Down's syndrome  . Intellectual disability Father     Social History   Tobacco Use  . Smoking status: Never Smoker  . Smokeless tobacco: Never Used  Substance Use Topics  . Alcohol use: No  . Drug use: Not Currently    Frequency: 2.0 times per week    Types: Marijuana    Comment: last used 05/2018    Home Medications Prior to Admission medications   Medication Sig Start Date End Date Taking? Authorizing  Provider  acetaminophen (TYLENOL) 325 MG tablet Take 650 mg by mouth every 6 (six) hours as needed for mild pain.    [provider]  ibuprofen (ADVIL) 600 MG tablet Take 1 tablet (600 mg total) by mouth every 6 (six) hours. 02/24/19   Arvilla Market, DO  methocarbamol (ROBAXIN) 500 MG tablet Take 1 tablet (500 mg total) by mouth 2 (two) times daily. 12/15/19   Tanda Rockers, PA-C  Multiple Vitamin (MULTIVITAMIN) tablet Take 1 tablet by mouth daily.    [provider]  naproxen (NAPROSYN) 500 MG tablet Take 1 tablet (500 mg total) by mouth 2 (two) times daily. 12/15/19   Bilbo Carcamo, PA-C  ondansetron (ZOFRAN ODT) 4 MG disintegrating tablet Take 1 tablet (4 mg total) by mouth every 8 (eight) hours as needed for nausea or vomiting. 05/22/19   Hall-Potvin, [place], PA-C  ondansetron (ZOFRAN) 4 MG tablet Take 1 tablet (4 mg  total) by mouth every 8 (eight) hours as needed. 12/11/19   Tegeler, Canary Brim, MD  oxyCODONE-acetaminophen (PERCOCET/ROXICET) 5-325 MG tablet Take 1 tablet by mouth every 4 (four) hours as needed for severe pain (synptomatic gallstones). 12/11/19   Tegeler, Canary Brim, MD  polyethylene glycol (MIRALAX) 17 g packet Take 17 g by mouth daily. Patient not taking: Reported on 12/11/2019 08/21/19   Belinda Fisher, PA-C  Prenatal Vit-Fe Fumarate-FA (PRENATAL COMPLETE) 14-0.4 MG TABS Take 1 tablet by mouth daily. Patient not taking: Reported on 12/11/2019 06/22/18   Linus Mako B, NP    Allergies    Vancomycin  Review of Systems   Review of Systems  Constitutional: Negative for chills and fever.  Eyes: Negative for visual disturbance.  Musculoskeletal: Positive for neck pain. Negative for neck stiffness.  Neurological: Negative for dizziness, speech difficulty, weakness and numbness.  All other systems reviewed and are negative.   Physical Exam Updated Vital Signs BP 125/81 (BP Location: Left Arm)   Pulse 92   Temp 98.7 F (37.1 C) (Oral)   Resp 15   Ht 5\' 7"  (1.702 m)   Wt 65.8 kg   LMP 11/22/2019   SpO2 100%   BMI 22.71 kg/m   Physical Exam Vitals and nursing note reviewed.  Constitutional:      Appearance: She is not ill-appearing.  HENT:     Head: Normocephalic and atraumatic.  Eyes:     Conjunctiva/sclera: Conjunctivae normal.  Neck:     Comments: No C midline spinal TTP. + Left paracervical musculature TTP. ROM intact to neck.  Cardiovascular:     Rate and Rhythm: Normal rate and regular rhythm.     Pulses: Normal pulses.  Pulmonary:     Effort: Pulmonary effort is normal.     Breath sounds: Normal breath sounds. No wheezing, rhonchi or rales.  Skin:    General: Skin is warm and dry.     Coloration: Skin is not jaundiced.  Neurological:     Mental Status: She is alert.     Comments: CN 3-12 grossly intact A&O x4 GCS 15 Sensation and strength intact Gait nonataxic  including with tandem walking Coordination with finger-to-nose WNL Neg romberg, neg pronator drift     ED Results / Procedures / Treatments   Labs (all labs ordered are listed, but only abnormal results are displayed) Labs Reviewed - No data to display  EKG None  Radiology No results found.  Procedures Procedures (including critical care time)  Medications Ordered in ED Medications - No data to display  ED Course  I have reviewed the triage vital signs and the nursing notes.  Pertinent labs & imaging results that were available during my care of the patient were reviewed by me and considered in my medical decision making (see chart for details).    MDM Rules/Calculators/A&P                      24 year old female presents to the ED today complaining of atraumatic left-sided neck pain that began while at work.  Has not taken anything for pain.  No focal neuro deficits on exam today.  Meningeal signs.  On arrival to the ED patient is afebrile, nontachycardic nontachypneic.  She does have some left-sided paracervical musculature tenderness to palpation.  Will treat with Robaxin and naproxen and have patient follow-up with PCP.  Strict return precautions have been discussed with patient.  She is in agreement with plan is stable for discharge home.   This note was prepared using Dragon voice recognition software and may include unintentional dictation errors due to the inherent limitations of voice recognition software.  Final Clinical Impression(s) / ED Diagnoses Final diagnoses:  Neck pain  Muscle spasms of neck    Rx / DC Orders ED Discharge Orders         Ordered    methocarbamol (ROBAXIN) 500 MG tablet  2 times daily     12/15/19 2227    naproxen (NAPROSYN) 500 MG tablet  2 times daily     12/15/19 2227           Discharge Instructions     Please pick up medications and take as prescribed. Do not drive while on the muscle relaxer as it can make you drowsy. I  would recommend taking these at nighttime to help you sleep and to take the naproxen during the daytime.   Follow up with your PCP. IF you do not have one you can follow up with [facility] for primary care needs  Return to the ED IMMEDIATELY for any worsening symptoms including worsening pain, radiation into your arm, weakness/numbness on one side of your body, vision changes, severe headache, excessive vomiting, confusion, neck stiffness, rash, fevers >100.4       Eustaquio Maize, PA-C 12/15/19 [place], [place], DO 12/15/19 2234

## 2019-12-15 NOTE — Discharge Instructions (Addendum)
Please pick up medications and take as prescribed. Do not drive while on the muscle relaxer as it can make you drowsy. I would recommend taking these at nighttime to help you sleep and to take the naproxen during the daytime.   Follow up with your PCP. IF you do not have one you can follow up with  and Wellness for primary care needs  Return to the ED IMMEDIATELY for any worsening symptoms including worsening pain, radiation into your arm, weakness/numbness on one side of your body, vision changes, severe headache, excessive vomiting, confusion, neck stiffness, rash, fevers >100.4

## 2019-12-15 NOTE — ED Triage Notes (Signed)
Neck pain since this morning at work. Left side radiating up left side of head.

## 2019-12-29 ENCOUNTER — Ambulatory Visit: Payer: Medicaid Other | Admitting: Family Medicine

## 2019-12-31 ENCOUNTER — Encounter: Payer: Self-pay | Admitting: Family Medicine

## 2019-12-31 ENCOUNTER — Other Ambulatory Visit: Payer: Self-pay

## 2019-12-31 ENCOUNTER — Ambulatory Visit (INDEPENDENT_AMBULATORY_CARE_PROVIDER_SITE_OTHER): Payer: Medicaid Other | Admitting: Family Medicine

## 2019-12-31 VITALS — BP 108/76 | HR 81 | Ht 67.0 in | Wt 149.0 lb

## 2019-12-31 DIAGNOSIS — L989 Disorder of the skin and subcutaneous tissue, unspecified: Secondary | ICD-10-CM

## 2020-01-03 DIAGNOSIS — L989 Disorder of the skin and subcutaneous tissue, unspecified: Secondary | ICD-10-CM | POA: Insufficient documentation

## 2020-01-03 NOTE — Patient Instructions (Signed)
AVS declined

## 2020-01-03 NOTE — Assessment & Plan Note (Addendum)
1 to 2 mm nodule approximately 2 inches behind the ear and 2 inches superior to the mastoid process, mobile, nontender, no skin change, no erythema, patient only noticed this a week ago.  No other postauricular or cervical chain involvement.  No sick symptoms lately  Expect sebaceous cyst, patient can monitor at home and if she develops new symptoms we can reevaluate.  I do not believe there is any intervention required at this time.

## 2020-01-03 NOTE — Progress Notes (Signed)
    SUBJECTIVE:   CHIEF COMPLAINT / HPI: Bump on scalp  Lesion of skin of scalp 1 to 2 mm nodule approximately 2 inches behind the ear and 2 inches superior to the mastoid process, mobile, nontender, no skin change, no erythema, patient only noticed this a week ago.  No other postauricular or cervical chain involvement.  No sick symptoms lately.  No hearing involvement, no injuries known   PERTINENT  PMH / PSH:   OBJECTIVE:   BP 108/76   Pulse 81   Ht 5\' 7"  (1.702 m)   Wt 149 lb (67.6 kg)   LMP 12/25/2019   SpO2 99%   BMI 23.34 kg/m   General: Alert pleasant, no distress Skin:1 to 2 mm nodule approximately 2 inches behind the ear and 2 inches superior to the mastoid process, mobile, nontender, no skin change, no erythema.  No other nodules on scalp or in cervical chain  ASSESSMENT/PLAN:   Lesion of skin of scalp 1 to 2 mm nodule approximately 2 inches behind the ear and 2 inches superior to the mastoid process, mobile, nontender, no skin change, no erythema, patient only noticed this a week ago.  No other postauricular or cervical chain involvement.  No sick symptoms lately  Expect sebaceous cyst, patient can monitor at home and if she develops new symptoms we can reevaluate.  I do not believe there is any intervention required at this time.     12/27/2019, DO [facility]

## 2020-02-02 ENCOUNTER — Other Ambulatory Visit: Payer: Self-pay

## 2020-02-02 ENCOUNTER — Encounter (HOSPITAL_COMMUNITY): Payer: Self-pay | Admitting: Emergency Medicine

## 2020-02-02 ENCOUNTER — Emergency Department (HOSPITAL_COMMUNITY): Payer: Medicaid Other

## 2020-02-02 ENCOUNTER — Emergency Department (HOSPITAL_COMMUNITY)
Admission: EM | Admit: 2020-02-02 | Discharge: 2020-02-02 | Disposition: A | Discharge status: Home / Self Care | Payer: Medicaid Other | Attending: Emergency Medicine | Admitting: Emergency Medicine

## 2020-02-02 DIAGNOSIS — R0789 Other chest pain: Secondary | ICD-10-CM | POA: Diagnosis not present

## 2020-02-02 DIAGNOSIS — R079 Chest pain, unspecified: Secondary | ICD-10-CM | POA: Diagnosis not present

## 2020-02-02 DIAGNOSIS — Z79899 Other long term (current) drug therapy: Secondary | ICD-10-CM | POA: Insufficient documentation

## 2020-02-02 DIAGNOSIS — J45909 Unspecified asthma, uncomplicated: Secondary | ICD-10-CM | POA: Insufficient documentation

## 2020-02-02 LAB — I-STAT BETA HCG BLOOD, ED (NOT ORDERABLE): I-stat hCG, quantitative: <5 m[IU]/mL (ref <5)

## 2020-02-02 LAB — BASIC METABOLIC PANEL
Anion gap: 9 (ref 5–15)
BUN: 10 mg/dL (ref 6–20)
CO2: 24 mmol/L (ref 22–32)
Calcium: 8.3 mg/dL — ABNORMAL LOW (ref 8.9–10.3)
Chloride: 104 mmol/L (ref 98–111)
Creatinine, Ser: 0.52 mg/dL (ref 0.44–1.00)
GFR calc Af Amer: >60 mL/min (ref >60)
GFR calc non Af Amer: >60 mL/min (ref >60)
Glucose, Bld: 91 mg/dL (ref 70–99)
Potassium: 3.6 mmol/L (ref 3.5–5.1)
Sodium: 137 mmol/L (ref 135–145)

## 2020-02-02 LAB — CBC
HCT: 30.7 % — ABNORMAL LOW (ref 36.0–46.0)
Hemoglobin: 10.1 g/dL — ABNORMAL LOW (ref 12.0–15.0)
MCH: 30 pg (ref 26.0–34.0)
MCHC: 32.9 g/dL (ref 30.0–36.0)
MCV: 91.1 fL (ref 80.0–100.0)
Platelets: 242 10*3/uL (ref 150–400)
RBC: 3.37 MIL/uL — ABNORMAL LOW (ref 3.87–5.11)
RDW: 12.1 % (ref 11.5–15.5)
WBC: 2.6 10*3/uL — ABNORMAL LOW (ref 4.0–10.5)
nRBC: 0 % (ref 0.0–0.2)

## 2020-02-02 LAB — TROPONIN I (HIGH SENSITIVITY): Troponin I (High Sensitivity): <2 ng/L (ref <18)

## 2020-02-02 MED ORDER — NAPROXEN 500 MG PO TABS
500.0000 mg | ORAL_TABLET | Freq: Two times a day (BID) | ORAL | 0 refills | Status: DC
Start: 2020-02-02 — End: 2020-03-14

## 2020-02-02 MED ORDER — SODIUM CHLORIDE 0.9% FLUSH: 3.0000 mL | Freq: Once | INTRAVENOUS | Status: DC

## 2020-02-02 MED ORDER — ACETAMINOPHEN 500 MG PO TABS
500.0000 mg | ORAL_TABLET | Freq: Four times a day (QID) | ORAL | 0 refills | Status: DC | PRN
Start: 2020-02-02 — End: 2020-04-16

## 2020-02-02 NOTE — ED Triage Notes (Signed)
Patient here from home reporting central chest pain x2 days.

## 2020-02-02 NOTE — ED Provider Notes (Signed)
[facility] DEPT Provider Note   CSN: [id number] Arrival date & time: 02/02/20  0753     History Chief Complaint  Patient presents with  . Chest Pain    Frances [name] is a 24 y.o. female with history of anemia, asthma, gallstones presents for evaluation of acute onset, constant midline chest pains for 4 days.  Reports that the pain is sharp, constant, does not radiate.  It worsens with coughing, belching, certain movements, and deep inspiration.  She denies fever, cough, shortness of breath, abdominal pain, leg swelling, recent travel or surgeries, hemoptysis, prior history of DVT or PE, or estrogen hormone replacement therapy or OCP use.  She has not tried anything for her symptoms.  She reports that her symptoms began while she was at work.  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  She is a non-smoker, denies recreational drug use or significant family history of heart disease under the age of 65.  The history is provided by the patient.       Past Medical History:  Diagnosis Date  . Anemia    on Iron supplements  . Asthma 1999   as a child; no current problems  . Chronic kidney disease    as a child  . Constipation   . Gall stones   . Gallstones 10/23/2016   10/2016: Seen by [facility], pt states, and plans for PP follow up. Advised her on low fat diet  . Seizures ([place])    seizure disorder as a child, treated with medication; none since  . UTI (lower urinary tract infection)     Patient Active Problem List   Diagnosis Date Noted  . Lesion of skin of scalp 01/03/2020  . Cervical cancer screening 10/31/2019  . Anemia 10/31/2019  . Encounter for pregnancy test, result unknown 10/31/2019  . Breast nodule 06/07/2019  . History of asthma 08/18/2018  . History of seizure disorder 08/18/2018  . History of kidney disease as a child 08/18/2018  . Gallstones 10/23/2016  . History of recurrent UTIs  09/16/2016    Past Surgical History:  Procedure Laterality Date  . IRRIGATION AND DEBRIDEMENT ABSCESS Left 06/11/2017   Procedure: IRRIGATION, DRAINAGE AND DEBRIDEMENT LEFT FLANK NECROTIZING SOFT TISSUE INFECTION;  Surgeon: Jackolyn Confer, MD;  Location: [facility];  Service: General;  Laterality: Left;  . TYMPANOSTOMY TUBE PLACEMENT    . WOUND DEBRIDEMENT Left 06/12/2017   Procedure: DRESSING CHANGE AND DEBRIDEMENT OF WOUND;  Surgeon: Jackolyn Confer, MD;  Location: [facility];  Service: General;  Laterality: Left;     OB History    Gravida  2   Para  2   Term  2   Preterm  0   AB  0   Living  2     SAB  0   TAB  0   Ectopic  0   Multiple  0   Live Births  2           Family History  Problem Relation Age of Onset  . Asthma Mother   . Hypertension Mother   . Asthma Sister   . Birth defects Sister        Down's syndrome  . Intellectual disability Father     Social History   Tobacco Use  . Smoking status: Never Smoker  . Smokeless tobacco: Never Used  Vaping Use  . Vaping Use: Never used  Substance Use Topics  .  Alcohol use: No  . Drug use: Not Currently    Frequency: 2.0 times per week    Types: Marijuana    Comment: last used 05/2018    Home Medications Prior to Admission medications   Medication Sig Start Date End Date Taking? Authorizing Provider  methocarbamol (ROBAXIN) 500 MG tablet Take 1 tablet (500 mg total) by mouth 2 (two) times daily. Patient taking differently: Take 500 mg by mouth 2 (two) times daily as needed for muscle spasms.  12/15/19  Yes Venter, Margaux, PA-C  Multiple Vitamin (MULTIVITAMIN) tablet Take 1 tablet by mouth daily.   Yes [provider]  ondansetron (ZOFRAN ODT) 4 MG disintegrating tablet Take 1 tablet (4 mg total) by mouth every 8 (eight) hours as needed for nausea or vomiting. 05/22/19  Yes Hall-Potvin, [place], PA-C  oxyCODONE-acetaminophen (PERCOCET/ROXICET) 5-325 MG tablet Take 1 tablet by mouth every 4  (four) hours as needed for severe pain (synptomatic gallstones). 12/11/19  Yes Tegeler, Canary Brim, MD  polyethylene glycol (MIRALAX) 17 g packet Take 17 g by mouth daily. Patient taking differently: Take 17 g by mouth daily as needed for mild constipation.  08/21/19  Yes Yu, Amy V, PA-C  acetaminophen (TYLENOL) 500 MG tablet Take 1 tablet (500 mg total) by mouth every 6 (six) hours as needed. 02/02/20   Luevenia Maxin, Eryanna Regal A, PA-C  naproxen (NAPROSYN) 500 MG tablet Take 1 tablet (500 mg total) by mouth 2 (two) times daily with a meal. 02/02/20   Eulis Salazar A, PA-C  ondansetron (ZOFRAN) 4 MG tablet Take 1 tablet (4 mg total) by mouth every 8 (eight) hours as needed. Patient not taking: Reported on 02/02/2020 12/11/19   Tegeler, Canary Brim, MD  Prenatal Vit-Fe Fumarate-FA (PRENATAL COMPLETE) 14-0.4 MG TABS Take 1 tablet by mouth daily. Patient not taking: Reported on 12/11/2019 06/22/18   Linus Mako B, NP    Allergies    Vancomycin  Review of Systems   Review of Systems  Constitutional: Negative for chills, diaphoresis and fever.  Respiratory: Negative for cough and shortness of breath.   Cardiovascular: Positive for chest pain. Negative for leg swelling.  Gastrointestinal: Negative for abdominal pain, nausea and vomiting.  Neurological: Negative for syncope.  All other systems reviewed and are negative.   Physical Exam Updated Vital Signs BP (!) 138/94   Pulse 86   Temp 98.3 F (36.8 C) (Oral)   Resp 16   LMP 01/23/2020 (Approximate)   SpO2 100%   Physical Exam Vitals and nursing note reviewed.  Constitutional:      General: She is not in acute distress.    Appearance: She is well-developed.  HENT:     Head: Normocephalic and atraumatic.  Eyes:     General:        Right eye: No discharge.        Left eye: No discharge.     Conjunctiva/sclera: Conjunctivae normal.  Neck:     Vascular: No JVD.     Trachea: No tracheal deviation.  Cardiovascular:     Rate and Rhythm: Normal  rate and regular rhythm.     Pulses:          Radial pulses are 2+ on the right side and 2+ on the left side.       Dorsalis pedis pulses are 2+ on the right side and 2+ on the left side.       Posterior tibial pulses are 2+ on the right side and 2+ on the left  side.     Heart sounds: Normal heart sounds.     Comments: 2+ radial and DP/PT pulses bilaterally, Homans sign absent bilaterally, no lower extremity edema, no palpable cords, compartments are soft  Pulmonary:     Effort: Pulmonary effort is normal.     Comments: Speaking in full sentences without difficulty. Chest:     Chest wall: Tenderness present.     Comments: Bilateral parasternal chest wall tenderness.  No deformity, crepitus, ecchymosis or flail segment. Abdominal:     General: There is no distension.  Musculoskeletal:     Cervical back: Normal range of motion and neck supple.     Right lower leg: No tenderness. No edema.     Left lower leg: No tenderness. No edema.  Skin:    General: Skin is warm.     Findings: No erythema.  Neurological:     Mental Status: She is alert.  Psychiatric:        Behavior: Behavior normal.     ED Results / Procedures / Treatments   Labs (all labs ordered are listed, but only abnormal results are displayed) Labs Reviewed  BASIC METABOLIC PANEL - Abnormal; Notable for the following components:      Result Value   Calcium 8.3 (*)    All other components within normal limits  CBC - Abnormal; Notable for the following components:   WBC 2.6 (*)    RBC 3.37 (*)    Hemoglobin 10.1 (*)    HCT 30.7 (*)    All other components within normal limits  I-STAT BETA HCG BLOOD, ED ([facility], [facility], [facility] ONLY)  I-STAT BETA HCG BLOOD, ED (NOT ORDERABLE)  TROPONIN I (HIGH SENSITIVITY)    EKG ED ECG REPORT   Date: 02/02/2020  Rate: 88  Rhythm: normal sinus rhythm  QRS Axis: normal  Intervals: normal  ST/T Wave abnormalities: normal  Conduction Disutrbances:none  Narrative Interpretation:   Old  EKG Reviewed: changed; previous EKG showed occasional ectopic beats  I have personally reviewed the EKG tracing and agree with the computerized printout as noted.   Radiology DG Chest 2 View  Result Date: 02/02/2020 CLINICAL DATA:  Chest pain for 2 days EXAM: CHEST - 2 VIEW COMPARISON:  01/27/2018 FINDINGS: The heart size and mediastinal contours are within normal limits. Both lungs are clear. The visualized skeletal structures are unremarkable. IMPRESSION: No active cardiopulmonary disease. Electronically Signed   By: Alcide Clever M.D.   On: 02/02/2020 09:04    Procedures Procedures (including critical care time)  Medications Ordered in ED Medications  sodium chloride flush (NS) 0.9 % injection 3 mL (has no administration in time range)    ED Course  I have reviewed the triage vital signs and the nursing notes.  Pertinent labs & imaging results that were available during my care of the patient were reviewed by me and considered in my medical decision making (see chart for details).    MDM Rules/Calculators/A&P                          Patient presenting for evaluation of constant chest pain for 5 days beginning while at work.  She is afebrile, vital signs are stable.  She is nontoxic in appearance.  The chest pain is very much so reproducible on palpation in the perasternal region.  No history of trauma but she does do a lot of movement, lifting and repetitive motion.  History and physical examination suggests  musculoskeletal etiology of her symptoms.  Her EKG shows normal sinus rhythm with no acute ischemic abnormalities.  A single troponin was obtained which was negative and given her symptoms have been constant for 4 days I would expect her troponin to be elevated if her symptoms were in the setting of ACS/MI.  Chest x-ray shows no acute cardiopulmonary abnormalities, no pneumothorax or pneumonia.  Lab work reviewed and interpreted by myself shows stable anemia, mild leukopenia, no  metabolic derangements or renal insufficiency.  Doubt dissection, cardiac tamponade, esophageal rupture, or abdominal pathology.  She is PERC negative, low suspicion of PE.  Discussed conservative therapy and management with NSAIDs, Tylenol, ice and heat therapy.  Recommend follow-up with PCP for reevaluation of symptoms.  Discussed strict ED return precautions. Patient verbalized understanding of and agreement with plan and is safe for discharge home at this time.   Final Clinical Impression(s) / ED Diagnoses Final diagnoses:  Atypical chest pain  Chest wall pain    Rx / DC Orders ED Discharge Orders         Ordered    naproxen (NAPROSYN) 500 MG tablet  2 times daily with meals     Discontinue  Reprint     02/02/20 1612    acetaminophen (TYLENOL) 500 MG tablet  Every 6 hours PRN     Discontinue  Reprint     02/02/20 1612           Jeanie Sewer, PA-C 02/02/20 1617    Maia Plan, MD 02/03/20 2037

## 2020-02-02 NOTE — Discharge Instructions (Signed)
Your pain seems most consistent with chest wall pain.  No evidence of heart attack on work-up today.  I would recommend taking naproxen twice daily with meals as as needed for pain.  In between these doses you can take extra strength Tylenol.  Do not exceed more than 4000 mg of Tylenol daily.  Take naproxen with meals to avoid upset stomach issues.  You can also apply topical cream such as icy hot or Salonpas to the chest wall.  You can also try ice or heat whichever feels best 20 minutes at a time a few times daily.  Follow-up with primary care provider for reevaluation of symptoms.  Return to the emergency department if any concerning signs or symptoms develop such as fevers, severe pain, shortness of breath, loss of consciousness.`

## 2020-03-14 ENCOUNTER — Emergency Department (HOSPITAL_COMMUNITY)
Admission: EM | Admit: 2020-03-14 | Discharge: 2020-03-14 | Disposition: A | Discharge status: Home / Self Care | Payer: Medicaid Other | Attending: Emergency Medicine | Admitting: Emergency Medicine

## 2020-03-14 ENCOUNTER — Encounter (HOSPITAL_COMMUNITY): Payer: Self-pay

## 2020-03-14 DIAGNOSIS — R1110 Vomiting, unspecified: Secondary | ICD-10-CM | POA: Diagnosis not present

## 2020-03-14 DIAGNOSIS — N189 Chronic kidney disease, unspecified: Secondary | ICD-10-CM | POA: Diagnosis not present

## 2020-03-14 DIAGNOSIS — J45909 Unspecified asthma, uncomplicated: Secondary | ICD-10-CM | POA: Insufficient documentation

## 2020-03-14 DIAGNOSIS — R1084 Generalized abdominal pain: Secondary | ICD-10-CM | POA: Diagnosis present

## 2020-03-14 DIAGNOSIS — K5900 Constipation, unspecified: Secondary | ICD-10-CM

## 2020-03-14 LAB — CBC
HCT: 38.1 % (ref 36.0–46.0)
Hemoglobin: 12.3 g/dL (ref 12.0–15.0)
MCH: 29.7 pg (ref 26.0–34.0)
MCHC: 32.3 g/dL (ref 30.0–36.0)
MCV: 92 fL (ref 80.0–100.0)
Platelets: 321 10*3/uL (ref 150–400)
RBC: 4.14 MIL/uL (ref 3.87–5.11)
RDW: 12.4 % (ref 11.5–15.5)
WBC: 6.3 10*3/uL (ref 4.0–10.5)
nRBC: 0 % (ref 0.0–0.2)

## 2020-03-14 LAB — URINALYSIS, ROUTINE W REFLEX MICROSCOPIC
Bilirubin Urine: NEGATIVE
Glucose, UA: NEGATIVE mg/dL
Hgb urine dipstick: NEGATIVE
Ketones, ur: NEGATIVE mg/dL
Leukocytes,Ua: NEGATIVE
Nitrite: NEGATIVE
Protein, ur: 30 mg/dL — AB
Specific Gravity, Urine: 1.023 (ref 1.005–1.030)
pH: 9 — ABNORMAL HIGH (ref 5.0–8.0)

## 2020-03-14 LAB — COMPREHENSIVE METABOLIC PANEL
ALT: 13 U/L (ref 0–44)
AST: 20 U/L (ref 15–41)
Albumin: 4.5 g/dL (ref 3.5–5.0)
Alkaline Phosphatase: 43 U/L (ref 38–126)
Anion gap: 10 (ref 5–15)
BUN: 11 mg/dL (ref 6–20)
CO2: 23 mmol/L (ref 22–32)
Calcium: 9.3 mg/dL (ref 8.9–10.3)
Chloride: 104 mmol/L (ref 98–111)
Creatinine, Ser: 0.5 mg/dL (ref 0.44–1.00)
GFR calc Af Amer: >60 mL/min (ref >60)
GFR calc non Af Amer: >60 mL/min (ref >60)
Glucose, Bld: 115 mg/dL — ABNORMAL HIGH (ref 70–99)
Potassium: 4 mmol/L (ref 3.5–5.1)
Sodium: 137 mmol/L (ref 135–145)
Total Bilirubin: 0.5 mg/dL (ref 0.3–1.2)
Total Protein: 7.9 g/dL (ref 6.5–8.1)

## 2020-03-14 LAB — LIPASE, BLOOD: Lipase: 25 U/L (ref 11–51)

## 2020-03-14 LAB — PREGNANCY, URINE: Preg Test, Ur: NEGATIVE

## 2020-03-14 MED ORDER — ONDANSETRON 4 MG PO TBDP
4.0000 mg | ORAL_TABLET | Freq: Once | ORAL | Status: AC
  Administered 2020-03-14: 4 mg via ORAL
  Filled 2020-03-14: qty 1

## 2020-03-14 MED ORDER — SODIUM CHLORIDE 0.9% FLUSH: 3.0000 mL | Freq: Once | INTRAVENOUS | Status: DC

## 2020-03-14 MED ORDER — ONDANSETRON 4 MG PO TBDP
4.0000 mg | ORAL_TABLET | Freq: Three times a day (TID) | ORAL | 0 refills | Status: DC | PRN
Start: 2020-03-14 — End: 2020-04-16

## 2020-03-14 NOTE — ED Triage Notes (Addendum)
Arrived POV from home. Patient reports diffuse abdominal pain, dizziness, nausea, and vomiting that started earlier today.

## 2020-03-14 NOTE — ED Notes (Signed)
Unable to obtain CBC or ISTAT vein blew

## 2020-03-14 NOTE — ED Provider Notes (Signed)
[facility] EMERGENCY DEPARTMENT Provider Note  CSN: [id number] Arrival date & time: 03/14/20 0035    History Chief Complaint  Patient presents with  . Abdominal Pain    HPI  Frances [name] is a 24 y.o. female with history of gall stones and constipation who reports onset of diffuse cramping abdominal pain yesterday. She reports she was able to have a BM but it was hard and small. She had occasional episodes of vomiting, most recently about 6am just before being placed in an exam room. She denies any fever, no blood in vomit or stool. No dysuria or vaginal complaints. Unsure if she is pregnant.    Past Medical History:  Diagnosis Date  . Anemia    on Iron supplements  . Asthma 1999   as a child; no current problems  . Chronic kidney disease    as a child  . Constipation   . Gall stones   . Gallstones 10/23/2016   10/2016: Seen by [facility], pt states, and plans for PP follow up. Advised her on low fat diet  . Seizures (HCC)    seizure disorder as a child, treated with medication; none since  . UTI (lower urinary tract infection)     Past Surgical History:  Procedure Laterality Date  . IRRIGATION AND DEBRIDEMENT ABSCESS Left 06/11/2017   Procedure: IRRIGATION, DRAINAGE AND DEBRIDEMENT LEFT FLANK NECROTIZING SOFT TISSUE INFECTION;  Surgeon: Avel Peace, MD;  Location: [facility];  Service: General;  Laterality: Left;  . TYMPANOSTOMY TUBE PLACEMENT    . WOUND DEBRIDEMENT Left 06/12/2017   Procedure: DRESSING CHANGE AND DEBRIDEMENT OF WOUND;  Surgeon: Avel Peace, MD;  Location: [facility];  Service: General;  Laterality: Left;    Family History  Problem Relation Age of Onset  . Asthma Mother   . Hypertension Mother   . Asthma Sister   . Birth defects Sister        Down's syndrome  . Intellectual disability Father     Social History   Tobacco Use  . Smoking status: Never Smoker  . Smokeless tobacco: Never Used  Vaping Use  . Vaping Use: Never used  Substance Use  Topics  . Alcohol use: No  . Drug use: Not Currently    Frequency: 2.0 times per week    Types: Marijuana    Comment: last used 05/2018     Home Medications Prior to Admission medications   Medication Sig Start Date End Date Taking? Authorizing Provider  acetaminophen (TYLENOL) 500 MG tablet Take 1 tablet (500 mg total) by mouth every 6 (six) hours as needed. 02/02/20   Fawze, Mina A, PA-C  Multiple Vitamin (MULTIVITAMIN) tablet Take 1 tablet by mouth daily.    [provider]  ondansetron (ZOFRAN ODT) 4 MG disintegrating tablet Take 1 tablet (4 mg total) by mouth every 8 (eight) hours as needed for nausea or vomiting. 03/14/20   Pollyann Savoy, MD  ondansetron (ZOFRAN) 4 MG tablet Take 1 tablet (4 mg total) by mouth every 8 (eight) hours as needed. Patient not taking: Reported on 02/02/2020 12/11/19   Tegeler, Canary Brim, MD  polyethylene glycol (MIRALAX) 17 g packet Take 17 g by mouth daily. Patient taking differently: Take 17 g by mouth daily as needed for mild constipation.  08/21/19   Belinda Fisher, PA-C     Allergies    Vancomycin   Review of Systems   Review of Systems A comprehensive review of systems was completed and negative except  as noted in HPI.    Physical Exam BP 107/78 (BP Location: Left Arm)   Pulse 77   Temp 97.9 F (36.6 C) (Oral)   Resp 16   Ht 5\' 7"  (1.702 m)   Wt 70.3 kg   LMP 02/20/2020   SpO2 100%   BMI 24.28 kg/m   Physical Exam Vitals and nursing note reviewed.  Constitutional:      Appearance: Normal appearance.  HENT:     Head: Normocephalic and atraumatic.     Nose: Nose normal.     Mouth/Throat:     Mouth: Mucous membranes are moist.  Eyes:     Extraocular Movements: Extraocular movements intact.     Conjunctiva/sclera: Conjunctivae normal.  Cardiovascular:     Rate and Rhythm: Normal rate.  Pulmonary:     Effort: Pulmonary effort is normal.     Breath sounds: Normal breath sounds.  Abdominal:     General: Abdomen is  flat.     Palpations: Abdomen is soft.     Tenderness: There is no abdominal tenderness. There is no guarding. Negative signs include Murphy's sign and McBurney's sign.  Musculoskeletal:        General: No swelling. Normal range of motion.     Cervical back: Neck supple.  Skin:    General: Skin is warm and dry.  Neurological:     General: No focal deficit present.     Mental Status: She is alert.  Psychiatric:        Mood and Affect: Mood normal.      ED Results / Procedures / Treatments   Labs (all labs ordered are listed, but only abnormal results are displayed) Labs Reviewed  COMPREHENSIVE METABOLIC PANEL - Abnormal; Notable for the following components:      Result Value   Glucose, Bld 115 (*)    All other components within normal limits  URINALYSIS, ROUTINE W REFLEX MICROSCOPIC - Abnormal; Notable for the following components:   pH 9.0 (*)    Protein, ur 30 (*)    Bacteria, UA RARE (*)    All other components within normal limits  LIPASE, BLOOD  CBC  PREGNANCY, URINE    EKG None  Radiology No results found.  Procedures Procedures  Medications Ordered in the ED Medications  sodium chloride flush (NS) 0.9 % injection 3 mL (has no administration in time range)  ondansetron (ZOFRAN-ODT) disintegrating tablet 4 mg (4 mg Oral Given 03/14/20 0818)     MDM Rules/Calculators/A&P MDM Labs ordered in triage reviewed and unremarkable including normal CBC, CMP and lipase. Pregnancy was not done, so that was added. ODT Zofran for nausea. Abdomen is benign, no peritoneal signs or signs of biliary disease.  ED Course  I have reviewed the triage vital signs and the nursing notes.  Pertinent labs & imaging results that were available during my care of the patient were reviewed by me and considered in my medical decision making (see chart for details).  Clinical Course as of Mar 15 915  Tue Mar 14, 2020  0908 Pregnancy is negative, patient feeling better after zofran,  tolerating PO fluids and crackers. Exam today is not consistent with cholecystitis, labs not consistent with biliary obstruction. She was advised to use miralax daily for her intermittent constipation. Rx for Zofran as needed, although she was cautioned this may exacerbate her constipation with frequent use. PCP followup.    [CS]    Clinical Course User Index [CS] 0909, MD  Final Clinical Impression(s) / ED Diagnoses Final diagnoses:  Generalized abdominal pain  Constipation, unspecified constipation type    Rx / DC Orders ED Discharge Orders         Ordered    ondansetron (ZOFRAN ODT) 4 MG disintegrating tablet  Every 8 hours PRN     Discontinue  Reprint     03/14/20 0914           Pollyann Savoy, MD 03/14/20 [phone]

## 2020-03-15 ENCOUNTER — Telehealth: Payer: Self-pay | Admitting: *Deleted

## 2020-03-15 NOTE — Telephone Encounter (Signed)
Contacted pt to complete transition of care assessment:  Transition Care Management Follow-up Telephone Call   [facility] Managed Care Transition Call Status:MM [facility] Call Made   Date of discharge and from where:[facility], 03/14/20   How have you been since you were released from the hospital? "ok"   Any questions or concerns? No  Items Reviewed:  Did the pt receive and understand the discharge instructions provided? Yes   Medications obtained and verified? Yes   Any new allergies since your discharge? No   Dietary orders reviewed? Yes  Do you have support at home? Yes, family  Functional Questionnaire: (I = Independent and D = Dependent)  ADLs: Independent Bathing/Dressing:Independent Meal Prep: Independent Eating: Independent Maintaining continence: Independent Transferring/Ambulation: Independent Managing Meds: Independent Follow up appointments reviewed:  PCP Hospital f/u appt confirmed? No  pt to call Dr Tawanna Cooler Simmons-Robinson on 03/15/20 to schedule follow up appt  Specialist Hospital f/u appt confirmed?n/a  Are transportation arrangements needed? No   If their condition worsens, is the pt aware to call PCP or go to the EmergencyDept.? yes  Was the patient provided with contact information for the PCP's office or ED? yes  Was to pt encouraged to call back with questions or concerns? Yes  Burnard Bunting, RN, BSN, CCRN [facility] [phone]

## 2020-04-16 ENCOUNTER — Encounter: Payer: Self-pay | Admitting: *Deleted

## 2020-04-16 ENCOUNTER — Other Ambulatory Visit: Payer: Self-pay

## 2020-04-16 ENCOUNTER — Ambulatory Visit
Admission: EM | Admit: 2020-04-16 | Discharge: 2020-04-16 | Disposition: A | Discharge status: Home / Self Care | Payer: Medicaid Other | Attending: Physician Assistant | Admitting: Physician Assistant

## 2020-04-16 DIAGNOSIS — R1013 Epigastric pain: Secondary | ICD-10-CM | POA: Diagnosis not present

## 2020-04-16 DIAGNOSIS — R110 Nausea: Secondary | ICD-10-CM

## 2020-04-16 LAB — POCT URINALYSIS DIP (MANUAL ENTRY)
Bilirubin, UA: NEGATIVE
Blood, UA: NEGATIVE
Glucose, UA: NEGATIVE mg/dL
Ketones, POC UA: NEGATIVE mg/dL
Leukocytes, UA: NEGATIVE
Nitrite, UA: NEGATIVE
Protein Ur, POC: NEGATIVE mg/dL
Spec Grav, UA: 1.025 (ref 1.010–1.025)
Urobilinogen, UA: 0.2 E.U./dL
pH, UA: 6.5 (ref 5.0–8.0)

## 2020-04-16 LAB — POCT URINE PREGNANCY: Preg Test, Ur: NEGATIVE

## 2020-04-16 MED ORDER — OMEPRAZOLE 20 MG PO CPDR
20.0000 mg | DELAYED_RELEASE_CAPSULE | Freq: Every day | ORAL | 0 refills | Status: DC
Start: 2020-04-16 — End: 2020-09-28

## 2020-04-16 MED ORDER — ONDANSETRON 4 MG PO TBDP
4.0000 mg | ORAL_TABLET | Freq: Three times a day (TID) | ORAL | 0 refills | Status: DC | PRN
Start: 2020-04-16 — End: 2020-09-06

## 2020-04-16 NOTE — ED Triage Notes (Signed)
C/O constant mid-abd pain since waking this AM.  Also c/o nausea.  Denies vomiting or diarrhea.  C/O having "hot flashes" while at work.  Denies fevers.

## 2020-04-16 NOTE — Discharge Instructions (Signed)
Zofran for nausea and vomiting as needed. Start omeprazole as directed. Keep hydrated, you urine should be clear to pale yellow in color. Bland diet, advance as tolerated. Monitor for any worsening of symptoms, nausea or vomiting not controlled by medication, worsening abdominal pain, fever, go to the emergency department for further evaluation needed.

## 2020-04-16 NOTE — ED Provider Notes (Signed)
[facility]    CSN: [id number] Arrival date & time: 04/16/20  1510      History   Chief Complaint Chief Complaint  Patient presents with  . Abdominal Pain    HPI Zea Kostka is a 24 y.o. female.   24 year old female comes in for abdominal pain starting this morning.  Nausea without vomiting.  Able to tolerate oral intake without significant changes to pain.  Denies diarrhea.  Last BM today with some straining.  Denies urinary symptoms.  Denies fever, chills, body aches.  Denies URI symptoms.     Past Medical History:  Diagnosis Date  . Anemia    on Iron supplements  . Asthma 1999   as a child; no current problems  . Chronic kidney disease    as a child  . Constipation   . Gall stones   . Gallstones 10/23/2016   10/2016: Seen by [facility], pt states, and plans for PP follow up. Advised her on low fat diet  . Seizures (HCC)    seizure disorder as a child, treated with medication; none since  . UTI (lower urinary tract infection)     Patient Active Problem List   Diagnosis Date Noted  . Lesion of skin of scalp 01/03/2020  . Cervical cancer screening 10/31/2019  . Anemia 10/31/2019  . Encounter for pregnancy test, result unknown 10/31/2019  . Breast nodule 06/07/2019  . History of asthma 08/18/2018  . History of seizure disorder 08/18/2018  . History of kidney disease as a child 08/18/2018  . Gallstones 10/23/2016  . History of recurrent UTIs 09/16/2016    Past Surgical History:  Procedure Laterality Date  . BACK SURGERY    . IRRIGATION AND DEBRIDEMENT ABSCESS Left 06/11/2017   Procedure: IRRIGATION, DRAINAGE AND DEBRIDEMENT LEFT FLANK NECROTIZING SOFT TISSUE INFECTION;  Surgeon: Avel Peace, MD;  Location: [facility];  Service: General;  Laterality: Left;  . TYMPANOSTOMY TUBE PLACEMENT    . WOUND DEBRIDEMENT Left 06/12/2017   Procedure: DRESSING CHANGE AND DEBRIDEMENT OF WOUND;  Surgeon: Avel Peace, MD;  Location: [facility];  Service: General;   Laterality: Left;    OB History    Gravida  2   Para  2   Term  2   Preterm  0   AB  0   Living  2     SAB  0   TAB  0   Ectopic  0   Multiple  0   Live Births  2            Home Medications    Prior to Admission medications   Medication Sig Start Date End Date Taking? Authorizing Provider  omeprazole (PRILOSEC) 20 MG capsule Take 1 capsule (20 mg total) by mouth daily. 04/16/20   Cathie Hoops, Triva Hueber V, PA-C  ondansetron (ZOFRAN ODT) 4 MG disintegrating tablet Take 1 tablet (4 mg total) by mouth every 8 (eight) hours as needed for nausea or vomiting. 04/16/20   Belinda Fisher, PA-C    Family History Family History  Problem Relation Age of Onset  . Asthma Mother   . Hypertension Mother   . Asthma Sister   . Birth defects Sister        Down's syndrome  . Intellectual disability Father     Social History Social History   Tobacco Use  . Smoking status: Never Smoker  . Smokeless tobacco: Never Used  Vaping Use  . Vaping Use: Never used  Substance Use Topics  .  Alcohol use: No  . Drug use: Not Currently    Frequency: 2.0 times per week    Types: Marijuana    Comment: last used 05/2018     Allergies   Vancomycin   Review of Systems Review of Systems  Reason unable to perform ROS: See HPI as above.     Physical Exam Triage Vital Signs ED Triage Vitals  Enc Vitals Group     BP 04/16/20 1554 (!) 158/96     Pulse Rate 04/16/20 1554 90     Resp 04/16/20 1554 18     Temp 04/16/20 1554 98.3 F (36.8 C)     Temp Source 04/16/20 1554 Oral     SpO2 04/16/20 1554 98 %     Weight --      Height --      Head Circumference --      Peak Flow --      Pain Score 04/16/20 1615 10     Pain Loc --      Pain Edu? --      Excl. in GC? --    No data found.  Updated Vital Signs BP (!) 158/96 (BP Location: Left Arm)   Pulse 90   Temp 98.3 F (36.8 C) (Oral)   Resp 18   LMP 03/22/2020 (Approximate)   SpO2 98%   Breastfeeding No   Physical Exam Constitutional:       General: She is not in acute distress.    Appearance: She is well-developed. She is not ill-appearing, toxic-appearing or diaphoretic.  HENT:     Head: Normocephalic and atraumatic.  Eyes:     Conjunctiva/sclera: Conjunctivae normal.     Pupils: Pupils are equal, round, and reactive to light.  Cardiovascular:     Rate and Rhythm: Normal rate and regular rhythm.  Pulmonary:     Effort: Pulmonary effort is normal. No respiratory distress.     Comments: LCTAB Abdominal:     General: Bowel sounds are normal.     Palpations: Abdomen is soft.     Tenderness: There is abdominal tenderness in the epigastric area. There is no right CVA tenderness, left CVA tenderness, guarding or rebound.  Musculoskeletal:     Cervical back: Normal range of motion and neck supple.  Skin:    General: Skin is warm and dry.  Neurological:     Mental Status: She is alert and oriented to person, place, and time.  Psychiatric:        Behavior: Behavior normal.        Judgment: Judgment normal.      UC Treatments / Results  Labs (all labs ordered are listed, but only abnormal results are displayed) Labs Reviewed  POCT URINE PREGNANCY  POCT URINALYSIS DIP (MANUAL ENTRY)    EKG   Radiology No results found.  Procedures Procedures (including critical care time)  Medications Ordered in UC Medications - No data to display  Initial Impression / Assessment and Plan / UC Course  I have reviewed the triage vital signs and the nursing notes.  Pertinent labs & imaging results that were available during my care of the patient were reviewed by me and considered in my medical decision making (see chart for details).    Patient afebrile, nontoxic.  Abdomen soft, +BS, epigastric tenderness without guarding or rebound.  Urine dipstick negative.  Will treat symptomatically for now with Zofran and omeprazole.  Push fluids.  Return precautions given.  Final Clinical Impressions(s) / UC Diagnoses  Final  diagnoses:  Abdominal pain, epigastric  Nausea without vomiting    ED Prescriptions    Medication Sig Dispense Auth. Provider   ondansetron (ZOFRAN ODT) 4 MG disintegrating tablet Take 1 tablet (4 mg total) by mouth every 8 (eight) hours as needed for nausea or vomiting. 20 tablet Kristie Bracewell V, PA-C   omeprazole (PRILOSEC) 20 MG capsule Take 1 capsule (20 mg total) by mouth daily. 7 capsule Belinda Fisher, PA-C     PDMP not reviewed this encounter.   Belinda Fisher, PA-C 04/17/20 [phone]

## 2020-05-10 ENCOUNTER — Encounter: Payer: Self-pay | Admitting: *Deleted

## 2020-05-10 ENCOUNTER — Ambulatory Visit
Admission: EM | Admit: 2020-05-10 | Discharge: 2020-05-10 | Disposition: A | Discharge status: Home / Self Care | Payer: Medicaid Other | Attending: Emergency Medicine | Admitting: Emergency Medicine

## 2020-05-10 ENCOUNTER — Other Ambulatory Visit: Payer: Self-pay

## 2020-05-10 DIAGNOSIS — R109 Unspecified abdominal pain: Secondary | ICD-10-CM

## 2020-05-10 LAB — POCT URINALYSIS DIP (MANUAL ENTRY)
Bilirubin, UA: NEGATIVE
Blood, UA: NEGATIVE
Glucose, UA: NEGATIVE mg/dL
Ketones, POC UA: NEGATIVE mg/dL
Leukocytes, UA: NEGATIVE
Nitrite, UA: NEGATIVE
Protein Ur, POC: NEGATIVE mg/dL
Spec Grav, UA: >=1.03 — AB (ref 1.010–1.025)
Urobilinogen, UA: 0.2 E.U./dL
pH, UA: 6.5 (ref 5.0–8.0)

## 2020-05-10 LAB — POCT URINE PREGNANCY: Preg Test, Ur: NEGATIVE

## 2020-05-10 NOTE — Discharge Instructions (Addendum)
RICE: rest, ice, compression, elevation as needed for pain.    Heat therapy (hot compress, warm wash rag, hot showers, etc.) can help relax muscles and soothe muscle aches. Cold therapy (ice packs) can be used to help swelling both after injury and after prolonged use of areas of chronic pain/aches.  Pain medication:  350 mg-1000 mg of Tylenol (acetaminophen) and/or 200 mg - 800 mg of Advil (ibuprofen, Motrin) every 8 hours as needed.  May alternate between the two throughout the day as they are generally safe to take together.  DO NOT exceed more than 3000 mg of Tylenol or 3200 mg of ibuprofen in a 24 hour period as this could damage your stomach, kidneys, liver, or increase your bleeding risk. 

## 2020-05-10 NOTE — ED Triage Notes (Addendum)
Patient in requesting to be checked for UTI. Patient states a couple of days ago she started to experience abdominal cramping, back pain and urinary urgency. Patient has been using cranberry juice and water with improvement in symptoms.

## 2020-05-10 NOTE — ED Provider Notes (Signed)
[name]    CSN: [id number] Arrival date & time: 05/10/20  1428      History   Chief Complaint Chief Complaint  Patient presents with  . Back Pain  . Abdominal Pain    HPI Frances [name] is a 24 y.o. female  Presenting for concern of UTI.  Patient provides history: Endorsing lower abdominal cramping, back pain, urinary urgency a few days ago.  Has been increasing water intake, drinking cranberry juice with improvement in symptoms.  Denied pelvic or vaginal pain, discharge.  No fever, hematuria.  Past Medical History:  Diagnosis Date  . Anemia    on Iron supplements  . Asthma 1999   as a child; no current problems  . Chronic kidney disease    as a child  . Constipation   . Gall stones   . Gallstones 10/23/2016   10/2016: Seen by [facility], pt states, and plans for PP follow up. Advised her on low fat diet  . Seizures (HCC)    seizure disorder as a child, treated with medication; none since  . UTI (lower urinary tract infection)     Patient Active Problem List   Diagnosis Date Noted  . Lesion of skin of scalp 01/03/2020  . Cervical cancer screening 10/31/2019  . Anemia 10/31/2019  . Encounter for pregnancy test, result unknown 10/31/2019  . Breast nodule 06/07/2019  . History of asthma 08/18/2018  . History of seizure disorder 08/18/2018  . History of kidney disease as a child 08/18/2018  . Gallstones 10/23/2016  . History of recurrent UTIs 09/16/2016    Past Surgical History:  Procedure Laterality Date  . BACK SURGERY    . IRRIGATION AND DEBRIDEMENT ABSCESS Left 06/11/2017   Procedure: IRRIGATION, DRAINAGE AND DEBRIDEMENT LEFT FLANK NECROTIZING SOFT TISSUE INFECTION;  Surgeon: Avel Peace, MD;  Location: [facility];  Service: General;  Laterality: Left;  . TYMPANOSTOMY TUBE PLACEMENT    . WOUND DEBRIDEMENT Left 06/12/2017   Procedure: DRESSING CHANGE AND DEBRIDEMENT OF WOUND;  Surgeon: Avel Peace, MD;  Location: [facility];  Service: General;   Laterality: Left;    OB History    Gravida  2   Para  2   Term  2   Preterm  0   AB  0   Living  2     SAB  0   TAB  0   Ectopic  0   Multiple  0   Live Births  2            Home Medications    Prior to Admission medications   Medication Sig Start Date End Date Taking? Authorizing Provider  omeprazole (PRILOSEC) 20 MG capsule Take 1 capsule (20 mg total) by mouth daily. 04/16/20  Yes Yu, Amy V, PA-C  ondansetron (ZOFRAN ODT) 4 MG disintegrating tablet Take 1 tablet (4 mg total) by mouth every 8 (eight) hours as needed for nausea or vomiting. 04/16/20  Yes Belinda Fisher, PA-C    Family History Family History  Problem Relation Age of Onset  . Asthma Mother   . Hypertension Mother   . Asthma Sister   . Birth defects Sister        Down's syndrome  . Intellectual disability Father     Social History Social History   Tobacco Use  . Smoking status: Never Smoker  . Smokeless tobacco: Never Used  Vaping Use  . Vaping Use: Never used  Substance Use Topics  . Alcohol use: No  .  Drug use: Not Currently    Frequency: 2.0 times per week    Types: Marijuana    Comment: last used 05/2018     Allergies   Vancomycin   Review of Systems As per HPI   Physical Exam Triage Vital Signs ED Triage Vitals [05/10/20 1509]  Enc Vitals Group     BP 102/71     Pulse Rate 79     Resp 16     Temp 98.5 F (36.9 C)     Temp Source Oral     SpO2 98 %     Weight      Height      Head Circumference      Peak Flow      Pain Score 0     Pain Loc      Pain Edu?      Excl. in GC?    No data found.  Updated Vital Signs BP 102/71 (BP Location: Left Arm)   Pulse 79   Temp 98.5 F (36.9 C) (Oral)   Resp 16   LMP 04/28/2020   SpO2 98%   Visual Acuity Right Eye Distance:   Left Eye Distance:   Bilateral Distance:    Right Eye Near:   Left Eye Near:    Bilateral Near:     Physical Exam Constitutional:      General: She is not in acute distress. HENT:      Head: Normocephalic and atraumatic.  Eyes:     General: No scleral icterus.    Pupils: Pupils are equal, round, and reactive to light.  Cardiovascular:     Rate and Rhythm: Normal rate.  Pulmonary:     Effort: Pulmonary effort is normal.  Abdominal:     General: Bowel sounds are normal.     Palpations: Abdomen is soft.     Tenderness: There is no abdominal tenderness. There is no right CVA tenderness, left CVA tenderness or guarding.  Skin:    Coloration: Skin is not jaundiced or pale.  Neurological:     Mental Status: She is alert and oriented to person, place, and time.      UC Treatments / Results  Labs (all labs ordered are listed, but only abnormal results are displayed) Labs Reviewed  POCT URINALYSIS DIP (MANUAL ENTRY) - Abnormal; Notable for the following components:      Result Value   Spec Grav, UA >=1.030 (*)    All other components within normal limits  POCT URINE PREGNANCY    EKG   Radiology No results found.  Procedures Procedures (including critical care time)  Medications Ordered in UC Medications - No data to display  Initial Impression / Assessment and Plan / UC Course  I have reviewed the triage vital signs and the nursing notes.  Pertinent labs & imaging results that were available during my care of the patient were reviewed by me and considered in my medical decision making (see chart for details).     Urine pregnancy negative, urine dipstick with elevated specific gravity.  Patient symptoms have improved.  We will continue supportive care, monitor closely.  Return precautions discussed, pt verbalized understanding and is agreeable to plan. Final Clinical Impressions(s) / UC Diagnoses   Final diagnoses:  Abdominal cramping     Discharge Instructions     RICE: rest, ice, compression, elevation as needed for pain.    Heat therapy (hot compress, warm wash rag, hot showers, etc.) can help relax muscles and soothe  muscle aches. Cold  therapy (ice packs) can be used to help swelling both after injury and after prolonged use of areas of chronic pain/aches.  Pain medication:  350 mg-1000 mg of Tylenol (acetaminophen) and/or 200 mg - 800 mg of Advil (ibuprofen, Motrin) every 8 hours as needed.  May alternate between the two throughout the day as they are generally safe to take together.  DO NOT exceed more than 3000 mg of Tylenol or 3200 mg of ibuprofen in a 24 hour period as this could damage your stomach, kidneys, liver, or increase your bleeding risk.    ED Prescriptions    None     PDMP not reviewed this encounter.   Hall-Potvin, [place], [place] 05/10/20 1621

## 2020-05-11 ENCOUNTER — Ambulatory Visit: Payer: Medicaid Other

## 2020-07-02 ENCOUNTER — Ambulatory Visit (HOSPITAL_COMMUNITY)
Admission: EM | Admit: 2020-07-02 | Discharge: 2020-07-02 | Disposition: A | Discharge status: Home / Self Care | Payer: Medicaid Other | Attending: Family Medicine | Admitting: Family Medicine

## 2020-07-02 ENCOUNTER — Encounter (HOSPITAL_COMMUNITY): Payer: Self-pay

## 2020-07-02 ENCOUNTER — Other Ambulatory Visit: Payer: Self-pay

## 2020-07-02 DIAGNOSIS — M545 Low back pain, unspecified: Secondary | ICD-10-CM | POA: Diagnosis not present

## 2020-07-02 DIAGNOSIS — R350 Frequency of micturition: Secondary | ICD-10-CM | POA: Diagnosis not present

## 2020-07-02 DIAGNOSIS — Z3201 Encounter for pregnancy test, result positive: Secondary | ICD-10-CM | POA: Diagnosis not present

## 2020-07-02 DIAGNOSIS — R1013 Epigastric pain: Secondary | ICD-10-CM | POA: Diagnosis not present

## 2020-07-02 LAB — POCT URINALYSIS DIPSTICK, ED / UC
Bilirubin Urine: NEGATIVE
Glucose, UA: NEGATIVE mg/dL
Hgb urine dipstick: NEGATIVE
Ketones, ur: NEGATIVE mg/dL
Leukocytes,Ua: NEGATIVE
Nitrite: NEGATIVE
Protein, ur: NEGATIVE mg/dL
Specific Gravity, Urine: 1.015 (ref 1.005–1.030)
Urobilinogen, UA: 0.2 mg/dL (ref 0.0–1.0)
pH: 6 (ref 5.0–8.0)

## 2020-07-02 LAB — POC URINE PREG, ED: Preg Test, Ur: POSITIVE — AB

## 2020-07-02 NOTE — Discharge Instructions (Signed)
Your pregnancy test was positive in the office   I have attached information about pregnancy and what to expect.  May take simethicone for gas during pregnancy  I have also attached a medication list that is safe for pregnancy as well  Follow up with OB  Follow up with [facility] for increased pain, vaginal bleeding, high fever, other concerning symptoms

## 2020-07-02 NOTE — ED Triage Notes (Signed)
Pt in with c/o upper abdominal pain that started today. Also c/o gas and back pain  Also states that she is urinating more frequently  States she took at home preg test today with positive result.  Denies dysuria, hematuria and other urinary sxs

## 2020-07-02 NOTE — ED Provider Notes (Signed)
[facility]    CSN: [id number] Arrival date & time: 07/02/20  1615   History   Chief Complaint Chief Complaint  Patient presents with  . Abdominal Pain    HPI Frances [name] is a 24 y.o. female.   Reports that she has been having low back pain, urinary frequency and feeling gassy. Reports that she took a home pregnancy test and it was positive. Requesting pregnancy test in office today. Denies vaginal bleeding, low abdominal cramping. This is her third pregnancy. Denies headache, nausea, vomiting, diarrhea, rash, fever, other symptoms. LMP: 05/26/20  ROS per HPI  The history is provided by the patient.  Abdominal Pain   Past Medical History:  Diagnosis Date  . Anemia    on Iron supplements  . Asthma 1999   as a child; no current problems  . Chronic kidney disease    as a child  . Constipation   . Gall stones   . Gallstones 10/23/2016   10/2016: Seen by [facility], pt states, and plans for PP follow up. Advised her on low fat diet  . Seizures (HCC)    seizure disorder as a child, treated with medication; none since  . UTI (lower urinary tract infection)     Patient Active Problem List   Diagnosis Date Noted  . Lesion of skin of scalp 01/03/2020  . Cervical cancer screening 10/31/2019  . Anemia 10/31/2019  . Encounter for pregnancy test, result unknown 10/31/2019  . Breast nodule 06/07/2019  . History of asthma 08/18/2018  . History of seizure disorder 08/18/2018  . History of kidney disease as a child 08/18/2018  . Gallstones 10/23/2016  . History of recurrent UTIs 09/16/2016    Past Surgical History:  Procedure Laterality Date  . BACK SURGERY    . IRRIGATION AND DEBRIDEMENT ABSCESS Left 06/11/2017   Procedure: IRRIGATION, DRAINAGE AND DEBRIDEMENT LEFT FLANK NECROTIZING SOFT TISSUE INFECTION;  Surgeon: Avel Peace, MD;  Location: [facility];  Service: General;  Laterality: Left;  . TYMPANOSTOMY TUBE PLACEMENT    . WOUND DEBRIDEMENT Left 06/12/2017    Procedure: DRESSING CHANGE AND DEBRIDEMENT OF WOUND;  Surgeon: Avel Peace, MD;  Location: [facility];  Service: General;  Laterality: Left;    OB History    Gravida  2   Para  2   Term  2   Preterm  0   AB  0   Living  2     SAB  0   TAB  0   Ectopic  0   Multiple  0   Live Births  2            Home Medications    Prior to Admission medications   Medication Sig Start Date End Date Taking? Authorizing Provider  omeprazole (PRILOSEC) 20 MG capsule Take 1 capsule (20 mg total) by mouth daily. 04/16/20   Cathie Hoops, Amy V, PA-C  ondansetron (ZOFRAN ODT) 4 MG disintegrating tablet Take 1 tablet (4 mg total) by mouth every 8 (eight) hours as needed for nausea or vomiting. 04/16/20   Belinda Fisher, PA-C    Family History Family History  Problem Relation Age of Onset  . Asthma Mother   . Hypertension Mother   . Asthma Sister   . Birth defects Sister        Down's syndrome  . Intellectual disability Father     Social History Social History   Tobacco Use  . Smoking status: Never Smoker  . Smokeless tobacco: Never  Used  Vaping Use  . Vaping Use: Never used  Substance Use Topics  . Alcohol use: No  . Drug use: Not Currently    Frequency: 2.0 times per week    Types: Marijuana    Comment: last used 05/2018     Allergies   Vancomycin   Review of Systems Review of Systems  Gastrointestinal: Positive for abdominal pain.     Physical Exam Triage Vital Signs ED Triage Vitals  Enc Vitals Group     BP 07/02/20 1700 127/82     Pulse Rate 07/02/20 1700 92     Resp 07/02/20 1700 18     Temp 07/02/20 1700 98 F (36.7 C)     Temp Source 07/02/20 1700 Oral     SpO2 07/02/20 1700 100 %     Weight --      Height --      Head Circumference --      Peak Flow --      Pain Score 07/02/20 1658 8     Pain Loc --      Pain Edu? --      Excl. in GC? --    No data found.  Updated Vital Signs BP 127/82 (BP Location: Right Arm)   Pulse 92   Temp 98 F (36.7 C)  (Oral)   Resp 18   LMP 05/26/2020 (Approximate)   SpO2 100%   Breastfeeding No       Physical Exam Vitals and nursing note reviewed.  Constitutional:      General: She is not in acute distress.    Appearance: She is well-developed and normal weight. She is not ill-appearing.  HENT:     Head: Normocephalic and atraumatic.     Mouth/Throat:     Mouth: Mucous membranes are moist.  Eyes:     Conjunctiva/sclera: Conjunctivae normal.  Cardiovascular:     Rate and Rhythm: Normal rate and regular rhythm.     Heart sounds: Normal heart sounds. No murmur heard.   Pulmonary:     Effort: Pulmonary effort is normal. No respiratory distress.     Breath sounds: Normal breath sounds.  Abdominal:     General: Abdomen is flat.     Palpations: Abdomen is soft.     Tenderness: There is abdominal tenderness. Negative signs include Rovsing's sign and psoas sign.  Musculoskeletal:     Cervical back: Neck supple.  Skin:    General: Skin is warm and dry.     Capillary Refill: Capillary refill takes less than 2 seconds.  Neurological:     General: No focal deficit present.     Mental Status: She is alert. She is disoriented.  Psychiatric:        Mood and Affect: Mood normal.        Behavior: Behavior normal.      UC Treatments / Results  Labs (all labs ordered are listed, but only abnormal results are displayed) Labs Reviewed  POC URINE PREG, ED - Abnormal; Notable for the following components:      Result Value   Preg Test, Ur POSITIVE (*)    All other components within normal limits  POCT URINALYSIS DIPSTICK, ED / UC    EKG   Radiology No results found.  Procedures Procedures (including critical care time)  Medications Ordered in UC Medications - No data to display  Initial Impression / Assessment and Plan / UC Course  I have reviewed the triage vital signs and the  nursing notes.  Pertinent labs & imaging results that were available during my care of the patient were  reviewed by me and considered in my medical decision making (see chart for details).     Positive Pregnancy Test Epigastric Pain Urinary Frequency Low Back pain  May take simethicone for gas May take tylenol for back pain Pregnancy test positive [facility] Follow up with [facility] for abdominal cramping, vaginal bleeding, high fever, other concerning symptoms.   Final Clinical Impressions(s) / UC Diagnoses   Final diagnoses:  Positive pregnancy test  Epigastric pain  Urinary frequency  Acute bilateral low back pain without sciatica     Discharge Instructions     Your pregnancy test was positive in the office   I have attached information about pregnancy and what to expect.  May take simethicone for gas during pregnancy  I have also attached a medication list that is safe for pregnancy as well  Follow up with OB  Follow up with MAU for increased pain, vaginal bleeding, high fever, other concerning symptoms      ED Prescriptions    None     PDMP not reviewed this encounter.   Moshe Cipro, NP 07/02/20 1757

## 2020-08-08 ENCOUNTER — Encounter: Payer: Self-pay | Admitting: Medical

## 2020-08-08 DIAGNOSIS — O0990 Supervision of high risk pregnancy, unspecified, unspecified trimester: Secondary | ICD-10-CM | POA: Insufficient documentation

## 2020-08-09 ENCOUNTER — Encounter: Payer: Self-pay | Admitting: Medical

## 2020-08-09 ENCOUNTER — Other Ambulatory Visit (HOSPITAL_COMMUNITY)
Admission: RE | Admit: 2020-08-09 | Discharge: 2020-08-09 | Disposition: A | Discharge status: Home / Self Care | Payer: Medicaid Other | Source: Ambulatory Visit | Attending: Medical | Admitting: Medical

## 2020-08-09 ENCOUNTER — Other Ambulatory Visit: Payer: Self-pay

## 2020-08-09 ENCOUNTER — Ambulatory Visit (INDEPENDENT_AMBULATORY_CARE_PROVIDER_SITE_OTHER): Payer: Medicaid Other | Admitting: Medical

## 2020-08-09 VITALS — BP 113/66 | HR 95 | Wt 142.4 lb

## 2020-08-09 DIAGNOSIS — Z23 Encounter for immunization: Secondary | ICD-10-CM

## 2020-08-09 DIAGNOSIS — Z3481 Encounter for supervision of other normal pregnancy, first trimester: Secondary | ICD-10-CM | POA: Diagnosis not present

## 2020-08-09 DIAGNOSIS — R8271 Bacteriuria: Secondary | ICD-10-CM

## 2020-08-09 DIAGNOSIS — O99891 Other specified diseases and conditions complicating pregnancy: Secondary | ICD-10-CM

## 2020-08-09 DIAGNOSIS — Z31430 Encounter of female for testing for genetic disease carrier status for procreative management: Secondary | ICD-10-CM | POA: Diagnosis not present

## 2020-08-09 MED ORDER — BLOOD PRESSURE MONITORING DEVI: 1.0000 | 0 refills | Status: DC

## 2020-08-09 NOTE — Progress Notes (Signed)
   PRENATAL VISIT NOTE  Subjective:  Frances [name] is a 24 y.o. G3P2002 at [redacted]w[redacted]d being seen today for her first prenatal visit for this pregnancy.  She is currently monitored for the following issues for this low-risk pregnancy and has History of recurrent UTIs; Gallstones; History of asthma; History of seizure disorder; History of kidney disease as a child; Breast nodule; Anemia; Lesion of skin of scalp; and Supervision of normal intrauterine pregnancy in multigravida in first trimester on their problem list.  Patient reports no complaints.  Contractions: Not present. Vag. Bleeding: None.  Movement: Absent. Denies leaking of fluid.   She is unsure about MOF. Desires contraception, unsure type.   The following portions of the patient's history were reviewed and updated as appropriate: allergies, current medications, past family history, past medical history, past social history, past surgical history and problem list.   Objective:   Vitals:   08/09/20 1037  BP: 113/66  Pulse: 95  Weight: 142 lb 6.4 oz (64.6 kg)    Fetal Status:     Movement: Absent    unable to obtain FHR with doppler  Pt informed that the ultrasound is considered a limited OB ultrasound and is not intended to be a complete ultrasound exam.  Patient also informed that the ultrasound is not being completed with the intent of assessing for fetal or placental anomalies or any pelvic abnormalities.  Explained that the purpose of today's ultrasound is to assess for  viability.  Patient acknowledges the purpose of the exam and the limitations of the study.  + cardiac activity noted, unable to measure rate, appears appropriate. +FM noted.    General:  Alert, oriented and cooperative. Patient is in no acute distress.  Skin: Skin is warm and dry. No rash noted.   Cardiovascular: Normal heart rate and rhythm noted  Respiratory: Normal respiratory effort, no problems with respiration noted. Clear to auscultation.   Abdomen:  Soft, gravid, appropriate for gestational age. Normal bowel sounds. Non-tender. Pain/Pressure: Absent     Pelvic: Deferred  Extremities: Normal range of motion.  Edema: None  Mental Status: Normal mood and affect. Normal behavior. Normal judgment and thought content.   Assessment and Plan:  Pregnancy: G3P2002 at [redacted]w[redacted]d 1. Supervision of normal intrauterine pregnancy in multigravida in first trimester - CBC/D/Plt+RPR+Rh+ABO+Rub Ab... - CH[facility] AMB BABYSCRIPTS SCHEDULE OPTIMIZATION - Culture, OB Urine - Genetic Screening - NIPS and Horizon today  - [place] MFM OB COMP + 14 WK; Scheduled  - Hemoglobin A1c - Blood Pressure Monitoring DEVI; 1 each by Does not apply route once a week.  Dispense: 1 each; Refill: 0 - Flu Vaccine QUAD 36+ mos IM (Fluarix, Quad PF) - Normal pap smear 10/2019 - 12# weight loss noted, patient states mild nausea without vomiting and denies loss of appetite or decreased PO intake   Preterm labor/first trimester warning symptoms and general obstetric precautions including but not limited to vaginal bleeding, contractions, leaking of fluid and fetal movement were reviewed in detail with the patient. Please refer to After Visit Summary for other counseling recommendations.   Discussed the normal visit cadence for prenatal care Discussed the nature of our practice with multiple providers including residents and students   Return in about 4 weeks (around 09/06/2020) for LOB, In-Person.  No future appointments.  Vonzella Nipple, PA-C

## 2020-08-09 NOTE — Progress Notes (Signed)
Medicaid Home Form Completed-08/09/20

## 2020-08-09 NOTE — Patient Instructions (Signed)
Safe Medications in Pregnancy   Acne:  Benzoyl Peroxide  Salicylic Acid   Backache/Headache:  Tylenol: 2 regular strength every 4 hours OR        2 Extra strength every 6 hours   Colds/Coughs/Allergies:  Benadryl (alcohol free) 25 mg every 6 hours as needed  Breath right strips  Claritin  Cepacol throat lozenges  Chloraseptic throat spray  Cold-Eeze- up to three times per day  Cough drops, alcohol free  Flonase (by prescription only)  Guaifenesin  Mucinex  Robitussin DM (plain only, alcohol free)  Saline nasal spray/drops  Sudafed (pseudoephedrine) & Actifed * use only after [redacted] weeks gestation and if you do not have high blood pressure  Tylenol  Vicks Vaporub  Zinc lozenges  Zyrtec   Constipation:  Colace  Ducolax suppositories  Fleet enema  Glycerin suppositories  Metamucil  Milk of magnesia  Miralax  Senokot  Smooth move tea   Diarrhea:  Kaopectate  Imodium A-D   *NO pepto Bismol   Hemorrhoids:  Anusol  Anusol HC  Preparation H  Tucks   Indigestion:  Tums  Maalox  Mylanta  Zantac  Pepcid   Insomnia:  Benadryl (alcohol free) 25mg  every 6 hours as needed  Tylenol PM  Unisom, no Gelcaps   Leg Cramps:  Tums  MagGel   Nausea/Vomiting:  Bonine  Dramamine  Emetrol  Ginger extract  Sea bands  Meclizine  Nausea medication to take during pregnancy:  Unisom (doxylamine succinate 25 mg tablets) Take one tablet daily at bedtime. If symptoms are not adequately controlled, the dose can be increased to a maximum recommended dose of two tablets daily (1/2 tablet in the morning, 1/2 tablet mid-afternoon and one at bedtime).  Vitamin B6 100mg  tablets. Take one tablet twice a day (up to 200 mg per day).   Skin Rashes:  Aveeno products  Benadryl cream or 25mg  every 6 hours as needed  Calamine Lotion  1% cortisone cream   Yeast infection:  Gyne-lotrimin 7  Monistat 7    **If taking multiple medications, please check labels to avoid  duplicating the same active ingredients  **take medication as directed on the label  ** Do not exceed 4000 mg of tylenol in 24 hours  **Do not take medications that contain aspirin or ibuprofen         [email or web address]

## 2020-08-10 LAB — CBC/D/PLT+RPR+RH+ABO+RUB AB...
Antibody Screen: NEGATIVE
Basophils Absolute: 0 10*3/uL (ref 0.0–0.2)
Basos: 0 %
EOS (ABSOLUTE): 0 10*3/uL (ref 0.0–0.4)
Eos: 1 %
HCV Ab: 0.1 s/co ratio (ref 0.0–0.9)
HIV Screen 4th Generation wRfx: NONREACTIVE
Hematocrit: 35.8 % (ref 34.0–46.6)
Hemoglobin: 12.4 g/dL (ref 11.1–15.9)
Hepatitis B Surface Ag: NEGATIVE
Immature Grans (Abs): 0 10*3/uL (ref 0.0–0.1)
Immature Granulocytes: 0 %
Lymphocytes Absolute: 1.1 10*3/uL (ref 0.7–3.1)
Lymphs: 22 %
MCH: 31.2 pg (ref 26.6–33.0)
MCHC: 34.6 g/dL (ref 31.5–35.7)
MCV: 90 fL (ref 79–97)
Monocytes Absolute: 0.4 10*3/uL (ref 0.1–0.9)
Monocytes: 7 %
Neutrophils Absolute: 3.4 10*3/uL (ref 1.4–7.0)
Neutrophils: 70 %
Platelets: 317 10*3/uL (ref 150–450)
RBC: 3.98 x10E6/uL (ref 3.77–5.28)
RDW: 12.7 % (ref 11.7–15.4)
RPR Ser Ql: NONREACTIVE
Rh Factor: POSITIVE
Rubella Antibodies, IGG: 3.28 index (ref >0.99)
WBC: 4.9 10*3/uL (ref 3.4–10.8)

## 2020-08-10 LAB — CERVICOVAGINAL ANCILLARY ONLY
Chlamydia: NEGATIVE
Comment: NEGATIVE
Comment: NORMAL
Neisseria Gonorrhea: NEGATIVE

## 2020-08-10 LAB — HCV INTERPRETATION

## 2020-08-10 LAB — HEMOGLOBIN A1C
Est. average glucose Bld gHb Est-mCnc: 91 mg/dL
Hgb A1c MFr Bld: 4.8 % (ref 4.8–5.6)

## 2020-08-12 NOTE — L&D Delivery Note (Signed)
Delivery Note Progressed quickly to complete dilation and FHR decelerated during contractions, so we had her push and she delivered in 3 pushes  At 10:23 PM a viable and healthy female was delivered via Vaginal, Spontaneous (Presentation: Middle Occiput Anterior).  APGAR: , ; weight  .   Placenta status: Spontaneous, Intact.  Cord: 3 vessels with the following complications: None.   Anesthesia: Epidural Episiotomy: None Lacerations: shallow Periurethral, persistent bleeding, so sutured Suture Repair: 3.0 vicryl rapide Est. Blood Loss (mL): 150  Mom to postpartum.  Baby to Couplet care / Skin to Skin.  Frances [name] 02/23/2021, 10:52 PM

## 2020-08-13 LAB — URINE CULTURE, OB REFLEX

## 2020-08-13 LAB — CULTURE, OB URINE

## 2020-08-14 MED ORDER — CEPHALEXIN 500 MG PO CAPS: 500.0000 mg | ORAL_CAPSULE | Freq: Four times a day (QID) | ORAL | 0 refills | Status: DC

## 2020-08-14 NOTE — Addendum Note (Signed)
Addended by: Marny Lowenstein on: 08/14/2020 12:48 PM   Modules accepted: Orders

## 2020-08-26 ENCOUNTER — Encounter (HOSPITAL_COMMUNITY): Payer: Self-pay | Admitting: Obstetrics & Gynecology

## 2020-08-26 ENCOUNTER — Inpatient Hospital Stay (HOSPITAL_COMMUNITY)
Admission: AD | Admit: 2020-08-26 | Discharge: 2020-08-26 | Disposition: A | Discharge status: Home / Self Care | Payer: Medicaid Other | Attending: Obstetrics & Gynecology | Admitting: Obstetrics & Gynecology

## 2020-08-26 ENCOUNTER — Other Ambulatory Visit: Payer: Self-pay

## 2020-08-26 DIAGNOSIS — U071 COVID-19: Secondary | ICD-10-CM | POA: Insufficient documentation

## 2020-08-26 DIAGNOSIS — Z3491 Encounter for supervision of normal pregnancy, unspecified, first trimester: Secondary | ICD-10-CM

## 2020-08-26 DIAGNOSIS — R102 Pelvic and perineal pain: Secondary | ICD-10-CM | POA: Insufficient documentation

## 2020-08-26 DIAGNOSIS — O98511 Other viral diseases complicating pregnancy, first trimester: Secondary | ICD-10-CM | POA: Diagnosis not present

## 2020-08-26 DIAGNOSIS — M25559 Pain in unspecified hip: Secondary | ICD-10-CM | POA: Insufficient documentation

## 2020-08-26 DIAGNOSIS — O26891 Other specified pregnancy related conditions, first trimester: Secondary | ICD-10-CM | POA: Diagnosis not present

## 2020-08-26 DIAGNOSIS — Z3A13 13 weeks gestation of pregnancy: Secondary | ICD-10-CM | POA: Insufficient documentation

## 2020-08-26 DIAGNOSIS — M549 Dorsalgia, unspecified: Secondary | ICD-10-CM | POA: Insufficient documentation

## 2020-08-26 LAB — COMPREHENSIVE METABOLIC PANEL
ALT: 18 U/L (ref 0–44)
AST: 25 U/L (ref 15–41)
Albumin: 3.4 g/dL — ABNORMAL LOW (ref 3.5–5.0)
Alkaline Phosphatase: 36 U/L — ABNORMAL LOW (ref 38–126)
Anion gap: 10 (ref 5–15)
BUN: 5 mg/dL — ABNORMAL LOW (ref 6–20)
CO2: 20 mmol/L — ABNORMAL LOW (ref 22–32)
Calcium: 8.9 mg/dL (ref 8.9–10.3)
Chloride: 101 mmol/L (ref 98–111)
Creatinine, Ser: 0.49 mg/dL (ref 0.44–1.00)
GFR, Estimated: >60 mL/min (ref >60)
Glucose, Bld: 96 mg/dL (ref 70–99)
Potassium: 3.9 mmol/L (ref 3.5–5.1)
Sodium: 131 mmol/L — ABNORMAL LOW (ref 135–145)
Total Bilirubin: 0.5 mg/dL (ref 0.3–1.2)
Total Protein: 6.4 g/dL — ABNORMAL LOW (ref 6.5–8.1)

## 2020-08-26 LAB — CBC WITH DIFFERENTIAL/PLATELET
Abs Immature Granulocytes: 0.03 10*3/uL (ref 0.00–0.07)
Basophils Absolute: 0 10*3/uL (ref 0.0–0.1)
Basophils Relative: 0 %
Eosinophils Absolute: 0 10*3/uL (ref 0.0–0.5)
Eosinophils Relative: 1 %
HCT: 31.2 % — ABNORMAL LOW (ref 36.0–46.0)
Hemoglobin: 10.5 g/dL — ABNORMAL LOW (ref 12.0–15.0)
Immature Granulocytes: 1 %
Lymphocytes Relative: 5 %
Lymphs Abs: 0.2 10*3/uL — ABNORMAL LOW (ref 0.7–4.0)
MCH: 30.8 pg (ref 26.0–34.0)
MCHC: 33.7 g/dL (ref 30.0–36.0)
MCV: 91.5 fL (ref 80.0–100.0)
Monocytes Absolute: 0.5 10*3/uL (ref 0.1–1.0)
Monocytes Relative: 12 %
Neutro Abs: 3.3 10*3/uL (ref 1.7–7.7)
Neutrophils Relative %: 81 %
Platelets: 218 10*3/uL (ref 150–400)
RBC: 3.41 MIL/uL — ABNORMAL LOW (ref 3.87–5.11)
RDW: 12.1 % (ref 11.5–15.5)
WBC: 4 10*3/uL (ref 4.0–10.5)
nRBC: 0 % (ref 0.0–0.2)

## 2020-08-26 LAB — URINALYSIS, ROUTINE W REFLEX MICROSCOPIC
Bilirubin Urine: NEGATIVE
Glucose, UA: NEGATIVE mg/dL
Hgb urine dipstick: NEGATIVE
Ketones, ur: NEGATIVE mg/dL
Nitrite: NEGATIVE
Protein, ur: NEGATIVE mg/dL
Specific Gravity, Urine: 1.018 (ref 1.005–1.030)
pH: 7 (ref 5.0–8.0)

## 2020-08-26 LAB — SARS CORONAVIRUS 2 (TAT 6-24 HRS): SARS Coronavirus 2: POSITIVE — AB

## 2020-08-26 MED ORDER — CYCLOBENZAPRINE HCL 10 MG PO TABS: 10.0000 mg | ORAL_TABLET | Freq: Two times a day (BID) | ORAL | 0 refills | Status: DC | PRN

## 2020-08-26 MED ORDER — ACETAMINOPHEN 500 MG PO TABS
1000.0000 mg | ORAL_TABLET | Freq: Once | ORAL | Status: AC
  Administered 2020-08-26: 1000 mg via ORAL
  Filled 2020-08-26: qty 2

## 2020-08-26 MED ORDER — CYCLOBENZAPRINE HCL 5 MG PO TABS
10.0000 mg | ORAL_TABLET | Freq: Once | ORAL | Status: AC
  Administered 2020-08-26: 10 mg via ORAL
  Filled 2020-08-26: qty 2

## 2020-08-26 NOTE — MAU Provider Note (Signed)
History     CSN: [id number]  Arrival date and time: 08/26/20 0762   Event Date/Time   First Provider Initiated Contact with Patient 08/26/20 [phone]      Chief Complaint  Patient presents with  . Back Pain  . Hip Pain   HPI Frances [name] is a 25 y.o. G3P2002 at [redacted]w[redacted]d who presents to MAU with chief complaint of pelvic and hip pain. These are new problems, onset last night 08/25/2020. Patient's pain score is 10/10. Her pain is located at several points along her lower back, pelvis and legs. She denies radiation from one site to another. She states she can barely walk. She has not taken medication or tried other treatments for this complaint. She denies abdominal pain, vaginal bleeding, dysuria, fever or recent illness.  She receives prenatal care with [facility].  OB History    Gravida  3   Para  2   Term  2   Preterm  0   AB  0   Living  2     SAB  0   IAB  0   Ectopic  0   Multiple  0   Live Births  2           Past Medical History:  Diagnosis Date  . Anemia    on Iron supplements  . Asthma 1999   as a child; no current problems  . Chronic kidney disease    as a child  . Constipation   . Gall stones   . Gallstones 10/23/2016   10/2016: Seen by [facility], pt states, and plans for PP follow up. Advised her on low fat diet  . Seizures (HCC)    seizure disorder as a child, treated with medication; none since  . UTI (lower urinary tract infection)     Past Surgical History:  Procedure Laterality Date  . BACK SURGERY    . IRRIGATION AND DEBRIDEMENT ABSCESS Left 06/11/2017   Procedure: IRRIGATION, DRAINAGE AND DEBRIDEMENT LEFT FLANK NECROTIZING SOFT TISSUE INFECTION;  Surgeon: Avel Peace, MD;  Location: [facility];  Service: General;  Laterality: Left;  . TYMPANOSTOMY TUBE PLACEMENT    . WOUND DEBRIDEMENT Left 06/12/2017   Procedure: DRESSING CHANGE AND DEBRIDEMENT OF WOUND;  Surgeon: Avel Peace, MD;  Location: [facility];  Service: General;  Laterality:  Left;    Family History  Problem Relation Age of Onset  . Asthma Mother   . Hypertension Mother   . Asthma Sister   . Birth defects Sister        Down's syndrome  . Intellectual disability Father     Social History   Tobacco Use  . Smoking status: Never Smoker  . Smokeless tobacco: Never Used  Vaping Use  . Vaping Use: Never used  Substance Use Topics  . Alcohol use: No  . Drug use: Not Currently    Frequency: 2.0 times per week    Types: Marijuana    Comment: last used 05/2018    Allergies:  Allergies  Allergen Reactions  . Vancomycin Other (See Comments)    Nephrotoxicity    Medications Prior to Admission  Medication Sig Dispense Refill Last Dose  . cephALEXin (KEFLEX) 500 MG capsule Take 1 capsule (500 mg total) by mouth 4 (four) times daily. 28 capsule 0 Past Week at Unknown time  . Prenatal Vit-Fe Fumarate-FA (PRENATAL MULTIVITAMIN) TABS tablet Take 1 tablet by mouth daily at 12 noon.   08/25/2020 at Unknown time  . Blood Pressure  Monitoring DEVI 1 each by Does not apply route once a week. 1 each 0   . omeprazole (PRILOSEC) 20 MG capsule Take 1 capsule (20 mg total) by mouth daily. (Patient not taking: Reported on 08/09/2020) 7 capsule 0   . ondansetron (ZOFRAN ODT) 4 MG disintegrating tablet Take 1 tablet (4 mg total) by mouth every 8 (eight) hours as needed for nausea or vomiting. (Patient not taking: Reported on 08/09/2020) 20 tablet 0     Review of Systems  Genitourinary: Positive for pelvic pain.  Musculoskeletal: Positive for back pain and gait problem.  All other systems reviewed and are negative.  Physical Exam   Blood pressure 110/67, pulse (!) 105, temperature 100.2 F (37.9 C), temperature source Oral, resp. rate 20, height 5\' 7"  (1.702 m), weight 67.6 kg, last menstrual period 05/26/2020, SpO2 100 %, not currently breastfeeding.  Physical Exam Vitals and nursing note reviewed. Exam conducted with a chaperone present.  Constitutional:       Appearance: Normal appearance. She is ill-appearing.  Cardiovascular:     Rate and Rhythm: Normal rate.     Pulses: Normal pulses.          Dorsalis pedis pulses are 2+ on the right side and 2+ on the left side.  Pulmonary:     Effort: Pulmonary effort is normal.     Breath sounds: Normal breath sounds.  Abdominal:     General: Abdomen is flat.  Musculoskeletal:     Right lower leg: No edema.     Left lower leg: No edema.  Feet:     Right foot:     Skin integrity: Skin integrity normal.     Left foot:     Skin integrity: Skin integrity normal.  Skin:    General: Skin is warm and dry.     Capillary Refill: Capillary refill takes less than 2 seconds.  Neurological:     Mental Status: She is alert and oriented to person, place, and time.  Psychiatric:        Mood and Affect: Mood normal.        Behavior: Behavior normal.        Thought Content: Thought content normal.        Judgment: Judgment normal.     MAU Course  Procedures  --S/p treatment for enterococcus faecalis UTI. Pertinent negatives: dysuria, flank pain, CVAT, fever --No concerning findings related to tissue perfusion, functionality, balance. Normal sensation.  Orders Placed This Encounter  Procedures  . SARS CORONAVIRUS 2 (TAT 6-24 HRS) Nasopharyngeal Nasopharyngeal Swab  . Culture, OB Urine  . Urinalysis, Routine w reflex microscopic Urine, Clean Catch  . CBC with Differential/Platelet  . Comprehensive metabolic panel  . Airborne and Contact precautions   Results for orders placed or performed during the hospital encounter of 08/26/20 (from the past 24 hour(s))  Urinalysis, Routine w reflex microscopic Urine, Clean Catch     Status: Abnormal   Collection Time: 08/26/20  6:50 AM  Result Value Ref Range   Color, Urine YELLOW YELLOW   APPearance HAZY (A) CLEAR   Specific Gravity, Urine 1.018 1.005 - 1.030   pH 7.0 5.0 - 8.0   Glucose, UA NEGATIVE NEGATIVE mg/dL   Hgb urine dipstick NEGATIVE NEGATIVE    Bilirubin Urine NEGATIVE NEGATIVE   Ketones, ur NEGATIVE NEGATIVE mg/dL   Protein, ur NEGATIVE NEGATIVE mg/dL   Nitrite NEGATIVE NEGATIVE   Leukocytes,Ua TRACE (A) NEGATIVE   RBC / HPF 0-5 0 - 5  RBC/hpf   WBC, UA 0-5 0 - 5 WBC/hpf   Bacteria, UA RARE (A) NONE SEEN   Squamous Epithelial / LPF 6-10 0 - 5   Mucus PRESENT   CBC with Differential/Platelet     Status: Abnormal   Collection Time: 08/26/20  7:02 AM  Result Value Ref Range   WBC 4.0 4.0 - 10.5 K/uL   RBC 3.41 (L) 3.87 - 5.11 MIL/uL   Hemoglobin 10.5 (L) 12.0 - 15.0 g/dL   HCT 23.5 (L) 36.1 - 44.3 %   MCV 91.5 80.0 - 100.0 fL   MCH 30.8 26.0 - 34.0 pg   MCHC 33.7 30.0 - 36.0 g/dL   RDW 15.4 00.8 - 67.6 %   Platelets 218 150 - 400 K/uL   nRBC 0.0 0.0 - 0.2 %   Neutrophils Relative % 81 %   Neutro Abs 3.3 1.7 - 7.7 K/uL   Lymphocytes Relative 5 %   Lymphs Abs 0.2 (L) 0.7 - 4.0 K/uL   Monocytes Relative 12 %   Monocytes Absolute 0.5 0.1 - 1.0 K/uL   Eosinophils Relative 1 %   Eosinophils Absolute 0.0 0.0 - 0.5 K/uL   Basophils Relative 0 %   Basophils Absolute 0.0 0.0 - 0.1 K/uL   Immature Granulocytes 1 %   Abs Immature Granulocytes 0.03 0.00 - 0.07 K/uL  Comprehensive metabolic panel     Status: Abnormal   Collection Time: 08/26/20  7:02 AM  Result Value Ref Range   Sodium 131 (L) 135 - 145 mmol/L   Potassium 3.9 3.5 - 5.1 mmol/L   Chloride 101 98 - 111 mmol/L   CO2 20 (L) 22 - 32 mmol/L   Glucose, Bld 96 70 - 99 mg/dL   BUN 5 (L) 6 - 20 mg/dL   Creatinine, Ser 1.95 0.44 - 1.00 mg/dL   Calcium 8.9 8.9 - 09.3 mg/dL   Total Protein 6.4 (L) 6.5 - 8.1 g/dL   Albumin 3.4 (L) 3.5 - 5.0 g/dL   AST 25 15 - 41 U/L   ALT 18 0 - 44 U/L   Alkaline Phosphatase 36 (L) 38 - 126 U/L   Total Bilirubin 0.5 0.3 - 1.2 mg/dL   GFR, Estimated >26 >71 mL/min   Anion gap 10 5 - 15   Meds ordered this encounter  Medications  . cyclobenzaprine (FLEXERIL) tablet 10 mg  . acetaminophen (TYLENOL) tablet 1,000 mg  . cyclobenzaprine  (FLEXERIL) 10 MG tablet    Sig: Take 1 tablet (10 mg total) by mouth 2 (two) times daily as needed for muscle spasms.    Dispense:  20 tablet    Refill:  0    Order Specific Question:   Supervising Provider    Answer:   Lazaro Arms [[id number]]   Assessment and Plan  --25 y.o. G3P2002 at [redacted]w[redacted]d  --FHT 167 by Doppler --No abnormal findings on physical exam --Pain resolved with medication administered in MAU --Discharge home in stable condition  Calvert Cantor, CNM 08/26/2020, 8:03 AM

## 2020-08-26 NOTE — MAU Note (Signed)
Pt reports bilateral hip and upper leg pain, lower back pain since last night. Denies vaginal bleeding or dysuria.

## 2020-08-26 NOTE — Discharge Instructions (Signed)
Round Ligament Pain during Pregnancy Many women will experience a type of pain referred to as round ligament pain during their pregnancy. This is associated with abdominal pain or discomfort. Since any type of abdominal pain during pregnancy can be disconcerting, it is important to talk about round ligament pain to relieve any anxiety or fears you may have regarding the symptoms you are feeling. Round ligament pain is due to normal changes that take place in the body during pregnancy. It is caused by stretching of the round ligaments attached to the uterus. More commonly it occurs on the right side of the pelvis. Round Ligament: An Overview Typically in the non-pregnant state the uterus is about the size of an apple or pear. There are thick ligaments which hold the uterus in place in the abdomen, referred to as round ligaments. During pregnancy, your uterus will expand in size and weight, and the ligaments supporting it will have to stretch, becoming longer and thinner. As these ligaments pull and tug they may irritate nearby nerve fibers, which causes pain. The severity of the pain in some cases can seem extreme. Some common symptoms of round ligament pain include:  Ligament spasms or contractions/cramps that trigger a sharp pain typically on the right side of the abdomen.  Pain upon waking or suddenly rolling over in your sleep.  Pain in the abdomen that is sharp brought on by exercise or other vigorous activity. Similar Problems Round ligament pain is often mistaken for other medical conditions because the symptoms are similar. Acute abdominal pain during pregnancy may also be a sign of other conditions including:  Abdominal cramps - Some abdominal pain is simply caused by change in bowel habits associated with pregnancy. Gas is a common problem that can cause sharp, shooting pain. You should always seek out medical care if your pain is accompanied by fever, chills, pain  upon urination or if you have difficulty walking. Further exams and tests will be conducted to ensure that you do not have a more serious condition. It is not uncommon for women with lower abdominal pain to have a urinary tract infection, thus you may also be asked for a urine sample. Treatment If all other conditions are ruled out you can treat your round ligament pain relatively easily. You may be advised to take some acetaminophen (Tylenol) to reduce the severity of any persistent pain and asked to reduce your activity level. You can apply a heating pad to the area of pain or take a warm bath. Lying on the opposite side of the pain may help as well. Most women will find relief from round ligament pain simply by altering their daily routines slightly. The good news is round ligament pain will disappear completely once you have given birth to your child!    Sciatica  Sciatica is pain, weakness, tingling, or loss of feeling (numbness) along the sciatic nerve. The sciatic nerve starts in the lower back and goes down the back of each leg. Sciatica usually goes away on its own or with treatment. Sometimes, sciatica may come back (recur). What are the causes? This condition happens when the sciatic nerve is pinched or has pressure put on it. This may be the result of:  A disk in between the bones of the spine bulging out too far (herniated disk).  Changes in the spinal disks that occur with aging.  A condition that affects a muscle in the butt.  Extra bone growth near the sciatic nerve.  A break (  fracture) of the area between your hip bones (pelvis).  Pregnancy.  Tumor. This is rare. What increases the risk? You are more likely to develop this condition if you:  Play sports that put pressure or stress on the spine.  Have poor strength and ease of movement (flexibility).  Have had a back injury in the past.  Have had back surgery.  Sit for long periods of time.  Do  activities that involve bending or lifting over and over again.  Are very overweight (obese). What are the signs or symptoms? Symptoms can vary from mild to very bad. They may include:  Any of these problems in the lower back, leg, hip, or butt: ? Mild tingling, loss of feeling, or dull aches. ? Burning sensations. ? Sharp pains.  Loss of feeling in the back of the calf or the sole of the foot.  Leg weakness.  Very bad back pain that makes it hard to move. These symptoms may get worse when you cough, sneeze, or laugh. They may also get worse when you sit or stand for long periods of time. How is this treated? This condition often gets better without any treatment. However, treatment may include:  Changing or cutting back on physical activity when you have pain.  Doing exercises and stretching.  Putting ice or heat on the affected area.  Medicines that help: ? To relieve pain and swelling. ? To relax your muscles.  Shots (injections) of medicines that help to relieve pain, irritation, and swelling.  Surgery. Follow these instructions at home: Medicines  Take over-the-counter and prescription medicines only as told by your doctor.  Ask your doctor if the medicine prescribed to you: ? Requires you to avoid driving or using heavy machinery. ? Can cause trouble pooping (constipation). You may need to take these steps to prevent or treat trouble pooping:  Drink enough fluids to keep your pee (urine) pale yellow.  Take over-the-counter or prescription medicines.  Eat foods that are high in fiber. These include beans, whole grains, and fresh fruits and vegetables.  Limit foods that are high in fat and sugar. These include fried or sweet foods. Managing pain  If told, put ice on the affected area. ? Put ice in a plastic bag. ? Place a towel between your skin and the bag. ? Leave the ice on for 20 minutes, 2-3 times a day.  If told, put heat on the affected area. Use the  heat source that your doctor tells you to use, such as a moist heat pack or a heating pad. ? Place a towel between your skin and the heat source. ? Leave the heat on for 20-30 minutes. ? Remove the heat if your skin turns bright red. This is very important if you are unable to feel pain, heat, or cold. You may have a greater risk of getting burned.      Activity  Return to your normal activities as told by your doctor. Ask your doctor what activities are safe for you.  Avoid activities that make your symptoms worse.  Take short rests during the day. ? When you rest for a long time, do some physical activity or stretching between periods of rest. ? Avoid sitting for a long time without moving. Get up and move around at least one time each hour.  Exercise and stretch regularly, as told by your doctor.  Do not lift anything that is heavier than 10 lb (4.5 kg) while you have symptoms of  sciatica. ? Avoid lifting heavy things even when you do not have symptoms. ? Avoid lifting heavy things over and over.  When you lift objects, always lift in a way that is safe for your body. To do this, you should: ? Bend your knees. ? Keep the object close to your body. ? Avoid twisting.   General instructions  Stay at a healthy weight.  Wear comfortable shoes that support your feet. Avoid wearing high heels.  Avoid sleeping on a mattress that is too soft or too hard. You might have less pain if you sleep on a mattress that is firm enough to support your back.  Keep all follow-up visits as told by your doctor. This is important. Contact a doctor if:  You have pain that: ? Wakes you up when you are sleeping. ? Gets worse when you lie down. ? Is worse than the pain you have had in the past. ? Lasts longer than 4 weeks.  You lose weight without trying. Get help right away if:  You cannot control when you pee (urinate) or poop (have a bowel movement).  You have weakness in any of these areas  and it gets worse: ? Lower back. ? The area between your hip bones. ? Butt. ? Legs.  You have redness or swelling of your back.  You have a burning feeling when you pee. Summary  Sciatica is pain, weakness, tingling, or loss of feeling (numbness) along the sciatic nerve.  This condition happens when the sciatic nerve is pinched or has pressure put on it.  Sciatica can cause pain, tingling, or loss of feeling (numbness) in the lower back, legs, hips, and butt.  Treatment often includes rest, exercise, medicines, and putting ice or heat on the affected area. This information is not intended to replace advice given to you by your health care provider. Make sure you discuss any questions you have with your health care provider. Document Revised: 08/17/2018 Document Reviewed: 08/17/2018 Elsevier Patient Education  2021 [employer].

## 2020-08-27 LAB — CULTURE, OB URINE: Culture: NO GROWTH

## 2020-09-06 ENCOUNTER — Telehealth (INDEPENDENT_AMBULATORY_CARE_PROVIDER_SITE_OTHER): Payer: Medicaid Other | Admitting: Student

## 2020-09-06 DIAGNOSIS — Z3481 Encounter for supervision of other normal pregnancy, first trimester: Secondary | ICD-10-CM

## 2020-09-06 DIAGNOSIS — U071 COVID-19: Secondary | ICD-10-CM

## 2020-09-06 DIAGNOSIS — O98512 Other viral diseases complicating pregnancy, second trimester: Secondary | ICD-10-CM

## 2020-09-06 DIAGNOSIS — Z3A14 14 weeks gestation of pregnancy: Secondary | ICD-10-CM

## 2020-09-06 MED ORDER — ONDANSETRON 4 MG PO TBDP: 4.0000 mg | ORAL_TABLET | Freq: Three times a day (TID) | ORAL | 0 refills | Status: DC | PRN

## 2020-09-06 NOTE — Progress Notes (Signed)
Pt hstates has not rcvd call from [facility] about BP Cuff, will f/u.

## 2020-09-06 NOTE — Progress Notes (Signed)
Patient ID: Frances [name], female   DOB: 1996/05/11, 25 y.o.   MRN: [id number] I connected with Frances [name] 09/06/20 at  9:35 AM EST by: MyChart video and verified that I am speaking with the correct person using two identifiers.  Patient is located at home and provider is located at [facility].     The purpose of this virtual visit is to provide medical care while limiting exposure to the novel coronavirus. I discussed the limitations, risks, security and privacy concerns of performing an evaluation and management service by MyChart video and the availability of in person appointments. I also discussed with the patient that there may be a patient responsible charge related to this service. By engaging in this virtual visit, you consent to the provision of healthcare.  Additionally, you authorize for your insurance to be billed for the services provided during this visit.  The patient expressed understanding and agreed to proceed.  The following staff members participated in the virtual visit:  Frances [name]     PRENATAL VISIT NOTE  Subjective:  Frances [name] is a 25 y.o. G3P2002 at [redacted]w[redacted]d  for phone visit for ongoing prenatal care.  She is currently monitored for the following issues for this low-risk pregnancy and has History of recurrent UTIs; Gallstones; History of asthma; History of seizure disorder; History of kidney disease as a child; Breast nodule; Anemia; Lesion of skin of scalp; Supervision of normal intrauterine pregnancy in multigravida in first trimester; and COVID-19 on their problem list.  Patient reports no complaints. She would like some nausea medicine as she is getting over COVID and doesn't have her appetite. She struggled with no appetite before, and then COVID made it worse. Denies any signs of UTI right now.   Contractions: Not present. Vag. Bleeding: None.  Movement: Present. Denies leaking of fluid.   The following portions of the patient's history were reviewed and updated as  appropriate: allergies, current medications, past family history, past medical history, past social history, past surgical history and problem list.   Objective:  There were no vitals filed for this visit. Self-Obtained  Fetal Status:     Movement: Present     Assessment and Plan:  Pregnancy: G3P2002 at [redacted]w[redacted]d 1. COVID-19 -will pick up BP cuff -Rx for Zofran sent to pharmacy on file per patient request as she is still suffering from Nausea due to COVID and morning sickness -Patient would like AFP; unable to place order now because do not have patient's current weight but can be scheduled later on.   Preterm labor symptoms and general obstetric precautions including but not limited to vaginal bleeding, contractions, leaking of fluid and fetal movement were reviewed in detail with the patient.  Return in about 4 weeks (around 10/04/2020), or LROB on My Chart.  Future Appointments  Date Time Provider Department Center  10/11/2020  9:45 AM [facility] US4 [facility]     Time spent on virtual visit: 15 minutes  Marylene Land, CNM

## 2020-09-11 ENCOUNTER — Encounter: Payer: Self-pay | Admitting: General Practice

## 2020-09-28 ENCOUNTER — Other Ambulatory Visit: Payer: Self-pay

## 2020-09-28 ENCOUNTER — Ambulatory Visit
Admission: EM | Admit: 2020-09-28 | Discharge: 2020-09-28 | Disposition: A | Discharge status: Home / Self Care | Payer: Medicaid Other | Attending: Family Medicine | Admitting: Family Medicine

## 2020-09-28 DIAGNOSIS — M79662 Pain in left lower leg: Secondary | ICD-10-CM | POA: Diagnosis not present

## 2020-09-28 NOTE — ED Triage Notes (Signed)
Patient states she has had left lower leg pain x 1 week that improved but worsened again., Pt is aox4 and ambulatory. Pt states her mother is concerned about clots.

## 2020-10-03 NOTE — ED Provider Notes (Signed)
[facility]   [id number] 09/28/20 Arrival Time: 1635  ASSESSMENT & PLAN:  1. Pain in left lower leg     No concern for DVT. Reassured. Likely muscle soreness. Prefers OTC ibup. WBAT   Recommend:  Follow-up Information    Schedule an appointment as soon as possible for a visit  with [facility].   Contact information: [address] [place] [address] [phone]              Reviewed expectations re: course of current medical issues. Questions answered. Outlined signs and symptoms indicating need for more acute intervention. Patient verbalized understanding. After Visit Summary given.  SUBJECTIVE: History from: patient. Frances [name] is a 25 y.o. female who reports R lower leg soreness; noted over past week. No injury. No swelling or bruising. Ambulatory without difficulty. No recent immobility or prolonged travel.  Past Surgical History:  Procedure Laterality Date  . BACK SURGERY    . IRRIGATION AND DEBRIDEMENT ABSCESS Left 06/11/2017   Procedure: IRRIGATION, DRAINAGE AND DEBRIDEMENT LEFT FLANK NECROTIZING SOFT TISSUE INFECTION;  Surgeon: Avel Peace, MD;  Location: [facility];  Service: General;  Laterality: Left;  . TYMPANOSTOMY TUBE PLACEMENT    . WOUND DEBRIDEMENT Left 06/12/2017   Procedure: DRESSING CHANGE AND DEBRIDEMENT OF WOUND;  Surgeon: Avel Peace, MD;  Location: [facility];  Service: General;  Laterality: Left;      OBJECTIVE:  Vitals:   09/28/20 1702 09/28/20 1719  BP: 124/74   Pulse: 95   Resp: 20   Temp: 98.6 F (37 C)   TempSrc: Oral   SpO2: 99% 99%    General appearance: alert; no distress HEENT: [place]; AT Neck: supple with FROM Resp: unlabored respirations Extremities: . RLE: warm with well perfused appearance; without gross deformities; calf swelling: none; bruising: none; knee and ankle ROM: normal CV: brisk extremity capillary refill of RLE; 2+ DP pulse of  RLE. Skin: warm and dry; no visible rashes Neurologic: gait normal; normal sensation and strength of bilateral LE Psychological: alert and cooperative; normal mood and affect   Allergies  Allergen Reactions  . Vancomycin Other (See Comments)    Nephrotoxicity    Past Medical History:  Diagnosis Date  . Anemia    on Iron supplements  . Asthma 1999   as a child; no current problems  . Chronic kidney disease    as a child  . Constipation   . Gall stones   . Gallstones 10/23/2016   10/2016: Seen by [facility], pt states, and plans for PP follow up. Advised her on low fat diet  . Seizures (HCC)    seizure disorder as a child, treated with medication; none since  . UTI (lower urinary tract infection)    Social History   Socioeconomic History  . Marital status: Single    Spouse name: Not on file  . Number of children: 1  . Years of education: 52  . Highest education level: High school graduate  Occupational History  . Not on file  Tobacco Use  . Smoking status: Never Smoker  . Smokeless tobacco: Never Used  Vaping Use  . Vaping Use: Never used  Substance and Sexual Activity  . Alcohol use: No  . Drug use: Not Currently    Frequency: 2.0 times per week    Types: Marijuana    Comment: last used 05/2018  . Sexual activity: Yes    Birth control/protection: None  Other Topics Concern  .  Not on file  Social History Narrative  . Not on file   Social Determinants of Health   Financial Resource Strain: Not on file  Food Insecurity: No Food Insecurity  . Worried About Programme researcher, broadcasting/film/video in the Last Year: Never true  . Ran Out of Food in the Last Year: Never true  Transportation Needs: Unmet Transportation Needs  . Lack of Transportation (Medical): Yes  . Lack of Transportation (Non-Medical): No  Physical Activity: Not on file  Stress: Not on file  Social Connections: Not on file   Family History  Problem Relation Age of Onset  . Asthma Mother   . Hypertension Mother    . Asthma Sister   . Birth defects Sister        Down's syndrome  . Intellectual disability Father    Past Surgical History:  Procedure Laterality Date  . BACK SURGERY    . IRRIGATION AND DEBRIDEMENT ABSCESS Left 06/11/2017   Procedure: IRRIGATION, DRAINAGE AND DEBRIDEMENT LEFT FLANK NECROTIZING SOFT TISSUE INFECTION;  Surgeon: Avel Peace, MD;  Location: [facility];  Service: General;  Laterality: Left;  . TYMPANOSTOMY TUBE PLACEMENT    . WOUND DEBRIDEMENT Left 06/12/2017   Procedure: DRESSING CHANGE AND DEBRIDEMENT OF WOUND;  Surgeon: Avel Peace, MD;  Location: [facility];  Service: General;  Laterality: Left;      Mardella Layman, MD 10/03/20 [phone]

## 2020-10-06 ENCOUNTER — Encounter: Payer: Self-pay | Admitting: *Deleted

## 2020-10-11 ENCOUNTER — Other Ambulatory Visit: Payer: Self-pay

## 2020-10-11 ENCOUNTER — Other Ambulatory Visit: Payer: Self-pay | Admitting: Maternal & Fetal Medicine

## 2020-10-11 ENCOUNTER — Ambulatory Visit: Payer: Medicaid Other | Attending: Obstetrics and Gynecology

## 2020-10-11 DIAGNOSIS — Z8669 Personal history of other diseases of the nervous system and sense organs: Secondary | ICD-10-CM

## 2020-10-11 DIAGNOSIS — Z362 Encounter for other antenatal screening follow-up: Secondary | ICD-10-CM

## 2020-10-11 DIAGNOSIS — Z363 Encounter for antenatal screening for malformations: Secondary | ICD-10-CM

## 2020-10-11 DIAGNOSIS — Z3481 Encounter for supervision of other normal pregnancy, first trimester: Secondary | ICD-10-CM | POA: Diagnosis present

## 2020-10-11 DIAGNOSIS — Z3A19 19 weeks gestation of pregnancy: Secondary | ICD-10-CM

## 2020-11-06 ENCOUNTER — Encounter: Payer: Self-pay | Admitting: *Deleted

## 2020-11-08 ENCOUNTER — Other Ambulatory Visit: Payer: Self-pay

## 2020-11-08 ENCOUNTER — Ambulatory Visit (INDEPENDENT_AMBULATORY_CARE_PROVIDER_SITE_OTHER): Payer: Medicaid Other | Admitting: Obstetrics and Gynecology

## 2020-11-08 ENCOUNTER — Encounter: Payer: Self-pay | Admitting: Obstetrics and Gynecology

## 2020-11-08 VITALS — BP 115/76 | HR 102

## 2020-11-08 DIAGNOSIS — O26899 Other specified pregnancy related conditions, unspecified trimester: Secondary | ICD-10-CM | POA: Diagnosis not present

## 2020-11-08 DIAGNOSIS — R1011 Right upper quadrant pain: Secondary | ICD-10-CM | POA: Diagnosis not present

## 2020-11-08 DIAGNOSIS — Z3A23 23 weeks gestation of pregnancy: Secondary | ICD-10-CM

## 2020-11-08 DIAGNOSIS — R519 Headache, unspecified: Secondary | ICD-10-CM

## 2020-11-08 DIAGNOSIS — Z3481 Encounter for supervision of other normal pregnancy, first trimester: Secondary | ICD-10-CM

## 2020-11-08 DIAGNOSIS — O26892 Other specified pregnancy related conditions, second trimester: Secondary | ICD-10-CM

## 2020-11-08 MED ORDER — METOCLOPRAMIDE HCL 10 MG PO TABS: 10.0000 mg | ORAL_TABLET | Freq: Four times a day (QID) | ORAL | 2 refills | Status: DC | PRN

## 2020-11-08 NOTE — Progress Notes (Signed)
Pressure on pelvic and occasional side pain, patient describes it as a "sharp" pain. Patient also complains of headaches, states that she gets them about everyday and rates them about a 9

## 2020-11-08 NOTE — Progress Notes (Signed)
   PRENATAL VISIT NOTE  Subjective:  Frances [name] is a 25 y.o. G3P2002 at [redacted]w[redacted]d being seen today for ongoing prenatal care.  She is currently monitored for the following issues for this low-risk pregnancy and has History of recurrent UTIs; Gallstones; History of asthma; History of seizure disorder; History of kidney disease as a child; Breast nodule; Anemia; Lesion of skin of scalp; Supervision of normal intrauterine pregnancy in multigravida in first trimester; and COVID-19 on their problem list.  Patient reports headaches and right upper quadrant pain. Reports she has never had headaches before. She did recently have Covid back in January and does endorse that headaches seemed to start around then. No associated visual changes. Patient has a history of gallbladder disease and says pain is similar, but it is not ilicited by eating. Reports pain come and goes and not alleviated or triggered by anything in particular..  Contractions: Not present. Vag. Bleeding: None.  Movement: Present. Denies leaking of fluid.   The following portions of the patient's history were reviewed and updated as appropriate: allergies, current medications, past family history, past medical history, past social history, past surgical history and problem list.   Objective:   Vitals:   11/08/20 1615  BP: 115/76  Pulse: (!) 102    Fetal Status: Fetal Heart Rate (bpm): 152   Movement: Present     General:  Alert, oriented and cooperative. Patient is in no acute distress.  Skin: Skin is warm and dry. No rash noted.   Cardiovascular: Normal heart rate noted  Respiratory: Normal respiratory effort, no problems with respiration noted  Abdomen: Soft, gravid, appropriate for gestational age.  Pain/Pressure: Present     Pelvic: Cervical exam deferred        Extremities: Normal range of motion.  Edema: None  Mental Status: Normal mood and affect. Normal behavior. Normal judgment and thought content.   Assessment and Plan:   Pregnancy: G3P2002 at [redacted]w[redacted]d 1. Supervision of normal intrauterine pregnancy in multigravida in first trimester  2. [redacted] weeks gestation of pregnancy  3. Pregnancy headache in second trimester -possibly due to covid, will trial reglan prn for headache. Also will obtain PEC labs given symptoms.   4. Right upper quadrant abdominal pain affecting pregnancy -possibly related to known gallbladder disease, however may be MSK, less likely PEC give gestational age but will obtain labs today   Preterm labor symptoms and general obstetric precautions including but not limited to vaginal bleeding, contractions, leaking of fluid and fetal movement were reviewed in detail with the patient. Please refer to After Visit Summary for other counseling recommendations.   Return in about 4 weeks (around 12/06/2020) for OB.  Future Appointments  Date Time Provider Department Center  11/15/2020  9:45 AM [facility] US5 [facility]  11/15/2020 11:00 AM [facility]  12/06/2020  8:20 AM [facility]  12/06/2020  9:15 AM Crissie Reese, Mary Sella, MD [facility]    Gita Kudo, MD

## 2020-11-09 ENCOUNTER — Other Ambulatory Visit: Payer: Self-pay | Admitting: Obstetrics and Gynecology

## 2020-11-09 LAB — URINALYSIS, ROUTINE W REFLEX MICROSCOPIC
Bilirubin, UA: NEGATIVE
Glucose, UA: NEGATIVE
Ketones, UA: NEGATIVE
Leukocytes,UA: NEGATIVE
Nitrite, UA: NEGATIVE
Protein,UA: NEGATIVE
RBC, UA: NEGATIVE
Specific Gravity, UA: 1.023 (ref 1.005–1.030)
Urobilinogen, Ur: 0.2 mg/dL (ref 0.2–1.0)
pH, UA: 7 (ref 5.0–7.5)

## 2020-11-09 LAB — COMPREHENSIVE METABOLIC PANEL
ALT: 14 IU/L (ref 0–32)
AST: 18 IU/L (ref 0–40)
Albumin/Globulin Ratio: 1.5 (ref 1.2–2.2)
Albumin: 3.8 g/dL — ABNORMAL LOW (ref 3.9–5.0)
Alkaline Phosphatase: 65 IU/L (ref 44–121)
BUN/Creatinine Ratio: 16 (ref 9–23)
BUN: 9 mg/dL (ref 6–20)
Bilirubin Total: <0.2 mg/dL (ref 0.0–1.2)
CO2: 20 mmol/L (ref 20–29)
Calcium: 9.4 mg/dL (ref 8.7–10.2)
Chloride: 102 mmol/L (ref 96–106)
Creatinine, Ser: 0.55 mg/dL — ABNORMAL LOW (ref 0.57–1.00)
Globulin, Total: 2.5 g/dL (ref 1.5–4.5)
Glucose: 77 mg/dL (ref 65–99)
Potassium: 4.1 mmol/L (ref 3.5–5.2)
Sodium: 137 mmol/L (ref 134–144)
Total Protein: 6.3 g/dL (ref 6.0–8.5)
eGFR: 133 mL/min/{1.73_m2} (ref >59)

## 2020-11-09 LAB — CBC
Hematocrit: 27.1 % — ABNORMAL LOW (ref 34.0–46.6)
Hemoglobin: 9.6 g/dL — ABNORMAL LOW (ref 11.1–15.9)
MCH: 31.2 pg (ref 26.6–33.0)
MCHC: 35.4 g/dL (ref 31.5–35.7)
MCV: 88 fL (ref 79–97)
Platelets: 294 10*3/uL (ref 150–450)
RBC: 3.08 x10E6/uL — ABNORMAL LOW (ref 3.77–5.28)
RDW: 11.4 % — ABNORMAL LOW (ref 11.7–15.4)
WBC: 7.2 10*3/uL (ref 3.4–10.8)

## 2020-11-09 MED ORDER — FERROUS SULFATE 325 (65 FE) MG PO TABS: 325.0000 mg | ORAL_TABLET | ORAL | 3 refills | Status: DC

## 2020-11-13 ENCOUNTER — Other Ambulatory Visit: Payer: Self-pay | Admitting: Lactation Services

## 2020-11-13 DIAGNOSIS — Z3481 Encounter for supervision of other normal pregnancy, first trimester: Secondary | ICD-10-CM

## 2020-11-15 ENCOUNTER — Other Ambulatory Visit: Payer: Medicaid Other

## 2020-11-15 ENCOUNTER — Ambulatory Visit: Payer: Medicaid Other | Attending: Maternal & Fetal Medicine

## 2020-11-15 ENCOUNTER — Ambulatory Visit
Admission: EM | Admit: 2020-11-15 | Discharge: 2020-11-15 | Disposition: A | Discharge status: Home / Self Care | Payer: Medicaid Other | Attending: Emergency Medicine | Admitting: Emergency Medicine

## 2020-11-15 ENCOUNTER — Other Ambulatory Visit: Payer: Self-pay

## 2020-11-15 ENCOUNTER — Other Ambulatory Visit: Payer: Self-pay | Admitting: *Deleted

## 2020-11-15 ENCOUNTER — Other Ambulatory Visit: Payer: Self-pay | Admitting: Maternal & Fetal Medicine

## 2020-11-15 DIAGNOSIS — Z3481 Encounter for supervision of other normal pregnancy, first trimester: Secondary | ICD-10-CM

## 2020-11-15 DIAGNOSIS — O365920 Maternal care for other known or suspected poor fetal growth, second trimester, not applicable or unspecified: Secondary | ICD-10-CM | POA: Diagnosis not present

## 2020-11-15 DIAGNOSIS — Z3A24 24 weeks gestation of pregnancy: Secondary | ICD-10-CM | POA: Diagnosis not present

## 2020-11-15 DIAGNOSIS — Z8669 Personal history of other diseases of the nervous system and sense organs: Secondary | ICD-10-CM

## 2020-11-15 DIAGNOSIS — Z362 Encounter for other antenatal screening follow-up: Secondary | ICD-10-CM | POA: Insufficient documentation

## 2020-11-15 DIAGNOSIS — Z113 Encounter for screening for infections with a predominantly sexual mode of transmission: Secondary | ICD-10-CM | POA: Insufficient documentation

## 2020-11-15 NOTE — ED Triage Notes (Addendum)
Pt presents to Urgent Care for STI testing. She states she has not had unprotected sex recently, but "wants to be safe." Denies c/o. Pt is currently 6 months pregnant.

## 2020-11-15 NOTE — Discharge Instructions (Signed)
We are testing you for Gonorrhea, Chlamydia, Trichomonas, Yeast and Bacterial Vaginosis. We will call you if anything is positive and let you know if you require any further treatment. Please inform partners of any positive results.   Please return if symptoms not improving with treatment, development of fever, nausea, vomiting, abdominal pain.

## 2020-11-15 NOTE — ED Provider Notes (Signed)
[name]    CSN: [id number] Arrival date & time: 11/15/20  1219      History   Chief Complaint No chief complaint on file. STD screen  HPI Frances [name] is a 25 y.o. female approximately 24 weeks 5 days pregnant presenting today for STD screening.  She denies any symptoms or recent unprotected intercourse.  HPI  Past Medical History:  Diagnosis Date  . Anemia    on Iron supplements  . Asthma 1999   as a child; no current problems  . Chronic kidney disease    as a child  . Constipation   . Gall stones   . Gallstones 10/23/2016   10/2016: Seen by [facility], pt states, and plans for PP follow up. Advised her on low fat diet  . Seizures (HCC)    seizure disorder as a child, treated with medication; none since  . UTI (lower urinary tract infection)     Patient Active Problem List   Diagnosis Date Noted  . COVID-19 09/06/2020  . Supervision of normal intrauterine pregnancy in multigravida in first trimester 08/08/2020  . Lesion of skin of scalp 01/03/2020  . Anemia 10/31/2019  . Breast nodule 06/07/2019  . History of asthma 08/18/2018  . History of seizure disorder 08/18/2018  . History of kidney disease as a child 08/18/2018  . Gallstones 10/23/2016  . History of recurrent UTIs 09/16/2016    Past Surgical History:  Procedure Laterality Date  . BACK SURGERY    . IRRIGATION AND DEBRIDEMENT ABSCESS Left 06/11/2017   Procedure: IRRIGATION, DRAINAGE AND DEBRIDEMENT LEFT FLANK NECROTIZING SOFT TISSUE INFECTION;  Surgeon: Avel Peace, MD;  Location: [facility];  Service: General;  Laterality: Left;  . TYMPANOSTOMY TUBE PLACEMENT    . WOUND DEBRIDEMENT Left 06/12/2017   Procedure: DRESSING CHANGE AND DEBRIDEMENT OF WOUND;  Surgeon: Avel Peace, MD;  Location: [facility];  Service: General;  Laterality: Left;    OB History    Gravida  3   Para  2   Term  2   Preterm  0   AB  0   Living  2     SAB  0   IAB  0   Ectopic  0   Multiple  0    Live Births  2            Home Medications    Prior to Admission medications   Medication Sig Start Date End Date Taking? Authorizing Provider  Blood Pressure Monitoring DEVI 1 each by Does not apply route once a week. Patient not taking: Reported on 11/08/2020 08/09/20   Marny Lowenstein, PA-C  ferrous sulfate 325 (65 FE) MG tablet Take 1 tablet (325 mg total) by mouth every other day. 11/09/20 11/09/21  Gita Kudo, MD  metoCLOPramide (REGLAN) 10 MG tablet Take 1 tablet (10 mg total) by mouth 4 (four) times daily as needed for nausea or vomiting. 11/08/20   Gita Kudo, MD  ondansetron (ZOFRAN ODT) 4 MG disintegrating tablet Take 1 tablet (4 mg total) by mouth every 8 (eight) hours as needed for nausea or vomiting. 09/06/20   Marylene Land, CNM  Prenatal Vit-Fe Fumarate-FA (PRENATAL MULTIVITAMIN) TABS tablet Take 1 tablet by mouth daily at 12 noon.    [provider]  omeprazole (PRILOSEC) 20 MG capsule Take 1 capsule (20 mg total) by mouth daily. Patient not taking: No sig reported 04/16/20 09/28/20  Lurline Idol    Family History Family  History  Problem Relation Age of Onset  . Asthma Mother   . Hypertension Mother   . Asthma Sister   . Birth defects Sister        Down's syndrome  . Intellectual disability Father     Social History Social History   Tobacco Use  . Smoking status: Never Smoker  . Smokeless tobacco: Never Used  Vaping Use  . Vaping Use: Never used  Substance Use Topics  . Alcohol use: No  . Drug use: Not Currently    Frequency: 2.0 times per week    Types: Marijuana    Comment: last used 05/2018     Allergies   Vancomycin   Review of Systems Review of Systems  Constitutional: Negative for fever.  Respiratory: Negative for shortness of breath.   Cardiovascular: Negative for chest pain.  Gastrointestinal: Negative for abdominal pain, diarrhea, nausea and vomiting.  Genitourinary: Negative for dysuria, flank pain,  genital sores, hematuria, menstrual problem, vaginal bleeding, vaginal discharge and vaginal pain.  Musculoskeletal: Negative for back pain.  Skin: Negative for rash.  Neurological: Negative for dizziness, light-headedness and headaches.     Physical Exam Triage Vital Signs ED Triage Vitals [11/15/20 1233]  Enc Vitals Group     BP      Pulse      Resp      Temp      Temp src      SpO2      Weight 168 lb (76.2 kg)     Height  (1.702 m)     Head Circumference      Peak Flow      Pain Score 0     Pain Loc      Pain Edu?      Excl. in GC?    No data found.  Updated Vital Signs BP 116/71   Pulse 98   Temp 98.6 F (37 C)   Resp 20   Ht  (1.702 m)   Wt 168 lb (76.2 kg)   LMP 05/26/2020 (Exact Date)   SpO2 97%   BMI 26.31 kg/m   Visual Acuity Right Eye Distance:   Left Eye Distance:   Bilateral Distance:    Right Eye Near:   Left Eye Near:    Bilateral Near:     Physical Exam Vitals and nursing note reviewed.  Constitutional:      Appearance: She is well-developed.     Comments: No acute distress  HENT:     Head: Normocephalic and atraumatic.     Nose: Nose normal.  Eyes:     Conjunctiva/sclera: Conjunctivae normal.  Cardiovascular:     Rate and Rhythm: Normal rate.  Pulmonary:     Effort: Pulmonary effort is normal. No respiratory distress.  Abdominal:     General: There is no distension.  Musculoskeletal:        General: Normal range of motion.     Cervical back: Neck supple.  Skin:    General: Skin is warm and dry.  Neurological:     Mental Status: She is alert and oriented to person, place, and time.      UC Treatments / Results  Labs (all labs ordered are listed, but only abnormal results are displayed) Labs Reviewed  CERVICOVAGINAL ANCILLARY ONLY    EKG   Radiology [place] MFM OB FOLLOW UP  Result Date: 11/15/2020 ----------------------------------------------------------------------  OBSTETRICS REPORT                        (  Signed Final 11/15/2020 10:57 am) ---------------------------------------------------------------------- Patient Info  ID #:       [id number]                          D.O.B.:  07-22-1996 (24 yrs)  Name:       Frances [name]               Visit Date: 11/15/2020 09:52 am ---------------------------------------------------------------------- Performed By  Attending:        Ma RingsVictor Fang MD         Ref. Address:     [address]                                                             [place], [place]                                                             [id number]  Performed By:     Tommie RaymondMesha Tester BS,       Location[facility], [facility]                                [facility]  Referred By:      [facility] ---------------------------------------------------------------------- Orders  #  Description                           Code        Ordered By  1  US MFM OB FOLLOW UP                   [id number]    Lin LandsmanORENTHIAN                                                       BOOKER  2  [place] MFM UA CORD DOPPLER                [id number]    Lin Landsman ----------------------------------------------------------------------  #  Order #                     Accession #                Episode #  1  [id number]                   [id number]                 [id number]  2  [id number]                   [id number]                 [id number] ---------------------------------------------------------------------- Indications  Maternal care for known or suspected poor      O36.5920  fetal growth, second trimester, not applicable  or unspecified IUGR  [redacted] weeks gestation of pregnancy                Z3A.24  Low risk NIPS, neg Horizon 4  Antenatal follow-up for nonvisualized fetal    Z36.2   anatomy ---------------------------------------------------------------------- Fetal Evaluation  Num Of Fetuses:         1  Fetal Heart Rate(bpm):  158  Cardiac Activity:       Observed  Presentation:           Cephalic  Placenta:               Anterior  P. Cord Insertion:      Previously Visualized  Amniotic Fluid  AFI FV:      Within normal limits                              Largest Pocket(cm)                              7.6 ---------------------------------------------------------------------- Biometry  BPD:      62.6  mm     G. Age:  25w 3d         67  %    CI:        76.47   %    70 - 86                                                          FL/HC:      19.3   %    18.7 - 20.3  HC:      226.8  mm     G. Age:  24w 5d         29  %    HC/AC:      1.24        1.04 - 1.22  AC:  183   mm     G. Age:  23w 1d          6  %    FL/BPD:     70.0   %    71 - 87  FL:       43.8  mm     G. Age:  24w 3d         27  %    FL/AC:      23.9   %    20 - 24  CER:      27.3  mm     G. Age:  24w 3d         48  %  LV:        4.1  mm  CM:        7.1  mm  Est. FW:     636  gm      1 lb 6 oz     11  % ---------------------------------------------------------------------- OB History  Gravidity:    3         Term:   2        Prem:   0        SAB:   0  TOP:          0       Ectopic:  0        Living: 2 ---------------------------------------------------------------------- Gestational Age  LMP:           24w 5d        Date:  05/26/20                 EDD:   03/02/21  U/S Today:     24w 3d                                        EDD:   03/04/21  Best:          24w 5d     Det. By:  LMP  (05/26/20)          EDD:   03/02/21 ---------------------------------------------------------------------- Anatomy  Cranium:               Appears normal         LVOT:                   Appears normal  Cavum:                 Appears normal         Aortic Arch:            Appears normal  Ventricles:            Appears normal         Ductal Arch:             Appears normal  Choroid Plexus:        Appears normal         Diaphragm:              Appears normal  Cerebellum:            Appears normal         Stomach:                Appears normal, left  sided  Posterior Fossa:       Appears normal         Abdomen:                Previously seen  Nuchal Fold:           Previously seen        Abdominal Wall:         Previously seen  Face:                  Orbits and profile     Cord Vessels:           Previously seen                         previously seen  Lips:                  Appears normal         Kidneys:                Appear normal  Palate:                Previously seen        Bladder:                Appears normal  Thoracic:              Appears normal         Spine:                  Appears normal  Heart:                 Appears normal         Upper Extremities:      Previously seen                         (4CH, axis, and                         situs)  RVOT:                  Appears normal         Lower Extremities:      Previously seen  Other:  Female gender previously seen. Heels/feet and open hands/5th digits          visualized previously. VC, 3VV and 3VTV visualized previously.          Technically difficult due to fetal position. ---------------------------------------------------------------------- Doppler - Fetal Vessels  Umbilical Artery   S/D     %tile      RI    %tile                             ADFV    RDFV   3.54       57    0.72       63                                No      No ---------------------------------------------------------------------- Cervix Uterus Adnexa  Cervix  Length:           3.45  cm.  Normal appearance by transabdominal scan.  Uterus  No abnormality visualized.  Right Ovary  Not visualized.  Left Ovary  Not visualized.  Cul De Sac  No free fluid seen.  Adnexa  No abnormality visualized. ----------------------------------------------------------------------  Comments  This patient was seen for a follow up exam as the views of  the fetal spine were suboptimal during her last exam..  She  denies any problems since her last exam and reports feeling  vigorous fetal movements throughout the day.  On today's exam, the EFW measures at the 11th percentile  for her gestational age (similar to her prior exam).  However,  the fetal abdominal circumference measures at the 6  percentile indicating possible fetal growth restriction.  There  was normal amniotic fluid noted.  Doppler studies of the umbilical arteries showed a normal  S/D ratio of 3.54.  There were no signs of absent or reversed  end-diastolic flow.  Due to possible fetal growth restriction, a repeat umbilical  artery Doppler study was scheduled in 2 weeks.  We will  reassess the fetal growth again in 3 weeks. ----------------------------------------------------------------------                   Ma Rings, MD Electronically Signed Final Report   11/15/2020 10:57 am ----------------------------------------------------------------------  [place] MFM UA CORD DOPPLER  Result Date: 11/15/2020 ----------------------------------------------------------------------  OBSTETRICS REPORT                       (Signed Final 11/15/2020 10:57 am) ---------------------------------------------------------------------- Patient Info  ID #:       [id number]                          D.O.B.:  10/26/1995 (24 yrs)  Name:       Frances [name]               Visit Date: 11/15/2020 09:52 am ---------------------------------------------------------------------- Performed By  Attending:        Ma Rings MD         Ref. Address:     [address]                                                             [place], [place]                                                             [address]  Performed By:     Tommie Raymond BS,       Location[facility]                    RDMS, RVT                                [facility]  Referred By:      [facility] ---------------------------------------------------------------------- Orders  #  Description                           Code        Ordered By  1  [place] MFM OB FOLLOW UP                   [phone]    Lin Landsman  2  [place] MFM UA CORD DOPPLER                [id number]    Lin Landsman ----------------------------------------------------------------------  #  Order #                     Accession #                Episode #  1  [id number]                   [id number]                 [id number]  2  [id number]                   [id number]                 [id number] ---------------------------------------------------------------------- Indications  Maternal care for known or suspected poor      O36.5920  fetal growth, second trimester, not applicable  or unspecified IUGR  [redacted] weeks gestation of pregnancy                Z3A.24  Low risk NIPS, neg Horizon 4  Antenatal follow-up for nonvisualized fetal    Z36.2  anatomy ---------------------------------------------------------------------- Fetal Evaluation  Num Of Fetuses:         1  Fetal Heart Rate(bpm):  158  Cardiac Activity:       Observed  Presentation:           Cephalic  Placenta:               Anterior  P. Cord Insertion:      Previously Visualized  Amniotic Fluid  AFI FV:      Within normal limits  Largest Pocket(cm)                              7.6 ---------------------------------------------------------------------- Biometry  BPD:      62.6  mm     G. Age:  25w 3d         67  %    CI:        76.47   %    70 - 86                                                          FL/HC:      19.3   %    18.7 - 20.3  HC:      226.8  mm     G. Age:  24w 5d          29  %    HC/AC:      1.24        1.04 - 1.22  AC:       183   mm     G. Age:  23w 1d          6  %    FL/BPD:     70.0   %    71 - 87  FL:       43.8  mm     G. Age:  24w 3d         27  %    FL/AC:      23.9   %    20 - 24  CER:      27.3  mm     G. Age:  24w 3d         48  %  LV:        4.1  mm  CM:        7.1  mm  Est. FW:     636  gm      1 lb 6 oz     11  % ---------------------------------------------------------------------- OB History  Gravidity:    3         Term:   2        Prem:   0        SAB:   0  TOP:          0       Ectopic:  0        Living: 2 ---------------------------------------------------------------------- Gestational Age  LMP:           24w 5d        Date:  05/26/20                 EDD:   03/02/21  U/S Today:     24w 3d                                        EDD:   03/04/21  Best:          24w 5d     Det. By:  LMP  (05/26/20)          EDD:   03/02/21 ---------------------------------------------------------------------- Anatomy  Cranium:               Appears normal         LVOT:                   Appears normal  Cavum:                 Appears normal         Aortic Arch:            Appears normal  Ventricles:            Appears normal         Ductal Arch:            Appears normal  Choroid Plexus:        Appears normal         Diaphragm:              Appears normal  Cerebellum:            Appears normal         Stomach:                Appears normal, left                                                                        sided  Posterior Fossa:       Appears normal         Abdomen:                Previously seen  Nuchal Fold:           Previously seen        Abdominal Wall:         Previously seen  Face:                  Orbits and profile     Cord Vessels:           Previously seen                         previously seen  Lips:                  Appears normal         Kidneys:                Appear normal  Palate:                Previously seen        Bladder:                 Appears normal  Thoracic:              Appears normal         Spine:                  Appears normal  Heart:                 Appears normal         Upper Extremities:      Previously seen                         (  4CH, axis, and                         situs)  RVOT:                  Appears normal         Lower Extremities:      Previously seen  Other:  Female gender previously seen. Heels/feet and open hands/5th digits          visualized previously. VC, 3VV and 3VTV visualized previously.          Technically difficult due to fetal position. ---------------------------------------------------------------------- Doppler - Fetal Vessels  Umbilical Artery   S/D     %tile      RI    %tile                             ADFV    RDFV   3.54       57    0.72       63                                No      No ---------------------------------------------------------------------- Cervix Uterus Adnexa  Cervix  Length:           3.45  cm.  Normal appearance by transabdominal scan.  Uterus  No abnormality visualized.  Right Ovary  Not visualized.  Left Ovary  Not visualized.  Cul De Sac  No free fluid seen.  Adnexa  No abnormality visualized. ---------------------------------------------------------------------- Comments  This patient was seen for a follow up exam as the views of  the fetal spine were suboptimal during her last exam..  She  denies any problems since her last exam and reports feeling  vigorous fetal movements throughout the day.  On today's exam, the EFW measures at the 11th percentile  for her gestational age (similar to her prior exam).  However,  the fetal abdominal circumference measures at the 6  percentile indicating possible fetal growth restriction.  There  was normal amniotic fluid noted.  Doppler studies of the umbilical arteries showed a normal  S/D ratio of 3.54.  There were no signs of absent or reversed  end-diastolic flow.  Due to possible fetal growth restriction, a repeat umbilical  artery Doppler  study was scheduled in 2 weeks.  We will  reassess the fetal growth again in 3 weeks. ----------------------------------------------------------------------                   Ma Rings, MD Electronically Signed Final Report   11/15/2020 10:57 am ----------------------------------------------------------------------   Procedures Procedures (including critical care time)  Medications Ordered in UC Medications - No data to display  Initial Impression / Assessment and Plan / UC Course  I have reviewed the triage vital signs and the nursing notes.  Pertinent labs & imaging results that were available during my care of the patient were reviewed by me and considered in my medical decision making (see chart for details).     Vaginal swab pending to screen for GC/chlamydia/trichomonas/yeast and BV Currently asymptomatic.  Will call with results and provide treatment as needed  Discussed strict return precautions. Patient verbalized understanding and is agreeable with plan.  Final Clinical Impressions(s) / UC Diagnoses   Final diagnoses:  Screen for STD (sexually transmitted disease)  Discharge Instructions     We are testing you for Gonorrhea, Chlamydia, Trichomonas, Yeast and Bacterial Vaginosis. We will call you if anything is positive and let you know if you require any further treatment. Please inform partners of any positive results.   Please return if symptoms not improving with treatment, development of fever, nausea, vomiting, abdominal pain.     ED Prescriptions    None     PDMP not reviewed this encounter.   Lew Dawes, PA-C 11/15/20 1325

## 2020-11-23 LAB — AFP, SERUM, OPEN SPINA BIFIDA

## 2020-11-24 LAB — CERVICOVAGINAL ANCILLARY ONLY
Bacterial Vaginitis (gardnerella): NEGATIVE
Candida Glabrata: NEGATIVE
Candida Vaginitis: NEGATIVE
Chlamydia: NEGATIVE
Comment: NEGATIVE
Comment: NEGATIVE
Comment: NEGATIVE
Comment: NEGATIVE
Comment: NEGATIVE
Comment: NORMAL
Neisseria Gonorrhea: NEGATIVE
Trichomonas: NEGATIVE

## 2020-12-01 ENCOUNTER — Ambulatory Visit: Payer: Medicaid Other | Attending: Obstetrics

## 2020-12-01 ENCOUNTER — Ambulatory Visit: Payer: Medicaid Other | Admitting: *Deleted

## 2020-12-01 ENCOUNTER — Other Ambulatory Visit: Payer: Self-pay

## 2020-12-01 ENCOUNTER — Encounter: Payer: Self-pay | Admitting: *Deleted

## 2020-12-01 ENCOUNTER — Ambulatory Visit (HOSPITAL_BASED_OUTPATIENT_CLINIC_OR_DEPARTMENT_OTHER): Payer: Medicaid Other | Admitting: *Deleted

## 2020-12-01 VITALS — BP 119/65 | HR 97

## 2020-12-01 DIAGNOSIS — O352XX0 Maternal care for (suspected) hereditary disease in fetus, not applicable or unspecified: Secondary | ICD-10-CM | POA: Diagnosis not present

## 2020-12-01 DIAGNOSIS — O365920 Maternal care for other known or suspected poor fetal growth, second trimester, not applicable or unspecified: Secondary | ICD-10-CM

## 2020-12-01 DIAGNOSIS — Z3A27 27 weeks gestation of pregnancy: Secondary | ICD-10-CM

## 2020-12-01 NOTE — Procedures (Signed)
Frances [name] 05-27-1996 [redacted]w[redacted]d  Fetus A Non-Stress Test Interpretation for 12/01/20  Indication: IUGR  Fetal Heart Rate A Mode: External Baseline Rate (A): 150 bpm Variability: Moderate Accelerations: 10 x 10 Decelerations: Variable Multiple birth?: No  Uterine Activity Mode: Palpation,Toco Contraction Frequency (min): none Resting Tone Palpated: Relaxed Resting Time: Adequate  Interpretation (Fetal Testing) Nonstress Test Interpretation: Reactive Overall Impression: Reassuring for gestational age Comments: Dr. Grace Bushy reviewed tracing.

## 2020-12-04 ENCOUNTER — Other Ambulatory Visit: Payer: Self-pay | Admitting: *Deleted

## 2020-12-04 DIAGNOSIS — Z3480 Encounter for supervision of other normal pregnancy, unspecified trimester: Secondary | ICD-10-CM

## 2020-12-06 ENCOUNTER — Ambulatory Visit (INDEPENDENT_AMBULATORY_CARE_PROVIDER_SITE_OTHER): Payer: Medicaid Other | Admitting: Family Medicine

## 2020-12-06 ENCOUNTER — Other Ambulatory Visit: Payer: Self-pay

## 2020-12-06 ENCOUNTER — Other Ambulatory Visit: Payer: Medicaid Other

## 2020-12-06 ENCOUNTER — Encounter: Payer: Self-pay | Admitting: Family Medicine

## 2020-12-06 VITALS — BP 117/72 | HR 94 | Wt 176.9 lb

## 2020-12-06 DIAGNOSIS — Z3A27 27 weeks gestation of pregnancy: Secondary | ICD-10-CM | POA: Diagnosis not present

## 2020-12-06 DIAGNOSIS — Z3481 Encounter for supervision of other normal pregnancy, first trimester: Secondary | ICD-10-CM | POA: Diagnosis not present

## 2020-12-06 DIAGNOSIS — Z3480 Encounter for supervision of other normal pregnancy, unspecified trimester: Secondary | ICD-10-CM | POA: Diagnosis not present

## 2020-12-06 DIAGNOSIS — Z23 Encounter for immunization: Secondary | ICD-10-CM | POA: Diagnosis not present

## 2020-12-06 DIAGNOSIS — O365920 Maternal care for other known or suspected poor fetal growth, second trimester, not applicable or unspecified: Secondary | ICD-10-CM

## 2020-12-06 DIAGNOSIS — D6489 Other specified anemias: Secondary | ICD-10-CM

## 2020-12-06 NOTE — Progress Notes (Signed)
   Subjective:  Frances [name] is a 25 y.o. G3P2002 at [redacted]w[redacted]d being seen today for ongoing prenatal care.  She is currently monitored for the following issues for this low-risk pregnancy and has History of recurrent UTIs; Gallstones; History of asthma; History of seizure disorder; History of kidney disease as a child; Breast nodule; Anemia; Lesion of skin of scalp; Supervision of normal intrauterine pregnancy in multigravida in first trimester; and COVID-19 on their problem list.  Patient reports no complaints.  Contractions: Not present. Vag. Bleeding: None.  Movement: Present. Denies leaking of fluid.   The following portions of the patient's history were reviewed and updated as appropriate: allergies, current medications, past family history, past medical history, past social history, past surgical history and problem list. Problem list updated.  Objective:   Vitals:   12/06/20 0857  BP: 117/72  Pulse: 94  Weight: 176 lb 14.4 oz (80.2 kg)    Fetal Status: Fetal Heart Rate (bpm): 155   Movement: Present     General:  Alert, oriented and cooperative. Patient is in no acute distress.  Skin: Skin is warm and dry. No rash noted.   Cardiovascular: Normal heart rate noted  Respiratory: Normal respiratory effort, no problems with respiration noted  Abdomen: Soft, gravid, appropriate for gestational age. Pain/Pressure: Absent     Pelvic: Vag. Bleeding: None     Cervical exam deferred        Extremities: Normal range of motion.  Edema: None  Mental Status: Normal mood and affect. Normal behavior. Normal judgment and thought content.   Urinalysis:      Assessment and Plan:  Pregnancy: G3P2002 at [redacted]w[redacted]d  1. Supervision of normal intrauterine pregnancy in multigravida in first trimester FHR and BP normal TDaP and 28wk labs today Following w MFM for AC 6%, EFW 11%, cord dopplers have been normal. Has follow up growth [place] tomorrow Still undecided regarding birth control, referred to  bedsider - Tdap vaccine greater than or equal to 7yo IM  2. Anemia due to other cause, not classified hgb 9.6 on last check, follow up CBC from today  Preterm labor symptoms and general obstetric precautions including but not limited to vaginal bleeding, contractions, leaking of fluid and fetal movement were reviewed in detail with the patient. Please refer to After Visit Summary for other counseling recommendations.  Return in 2 weeks (on 12/20/2020) for [facility], ob visit.   Venora Maples, MD

## 2020-12-06 NOTE — Patient Instructions (Addendum)
 Contraception Choices Contraception, also called birth control, refers to methods or devices that prevent pregnancy. Hormonal methods Contraceptive implant A contraceptive implant is a thin, plastic tube that contains a hormone that prevents pregnancy. It is different from an intrauterine device (IUD). It is inserted into the upper part of the arm by a health care provider. Implants can be effective for up to 3 years. Progestin-only injections Progestin-only injections are injections of progestin, a synthetic form of the hormone progesterone. They are given every 3 months by a health care provider. Birth control pills Birth control pills are pills that contain hormones that prevent pregnancy. They must be taken once a day, preferably at the same time each day. A prescription is needed to use this method of contraception. Birth control patch The birth control patch contains hormones that prevent pregnancy. It is placed on the skin and must be changed once a week for three weeks and removed on the fourth week. A prescription is needed to use this method of contraception. Vaginal ring A vaginal ring contains hormones that prevent pregnancy. It is placed in the vagina for three weeks and removed on the fourth week. After that, the process is repeated with a new ring. A prescription is needed to use this method of contraception. Emergency contraceptive Emergency contraceptives prevent pregnancy after unprotected sex. They come in pill form and can be taken up to 5 days after sex. They work best the sooner they are taken after having sex. Most emergency contraceptives are available without a prescription. This method should not be used as your only form of birth control.   Barrier methods Female condom A female condom is a thin sheath that is worn over the penis during sex. Condoms keep sperm from going inside a woman's body. They can be used with a sperm-killing substance (spermicide) to increase their  effectiveness. They should be thrown away after one use. Female condom A female condom is a soft, loose-fitting sheath that is put into the vagina before sex. The condom keeps sperm from going inside a woman's body. They should be thrown away after one use. Diaphragm A diaphragm is a soft, dome-shaped barrier. It is inserted into the vagina before sex, along with a spermicide. The diaphragm blocks sperm from entering the uterus, and the spermicide kills sperm. A diaphragm should be left in the vagina for 6-8 hours after sex and removed within 24 hours. A diaphragm is prescribed and fitted by a health care provider. A diaphragm should be replaced every 1-2 years, after giving birth, after gaining more than 15 lb (6.8 kg), and after pelvic surgery. Cervical cap A cervical cap is a round, soft latex or plastic cup that fits over the cervix. It is inserted into the vagina before sex, along with spermicide. It blocks sperm from entering the uterus. The cap should be left in place for 6-8 hours after sex and removed within 48 hours. A cervical cap must be prescribed and fitted by a health care provider. It should be replaced every 2 years. Sponge A sponge is a soft, circular piece of polyurethane foam with spermicide in it. The sponge helps block sperm from entering the uterus, and the spermicide kills sperm. To use it, you make it wet and then insert it into the vagina. It should be inserted before sex, left in for at least 6 hours after sex, and removed and thrown away within 30 hours. Spermicides Spermicides are chemicals that kill or block sperm from entering the   cervix and uterus. They can come as a cream, jelly, suppository, foam, or tablet. A spermicide should be inserted into the vagina with an applicator at least 10-15 minutes before sex to allow time for it to work. The process must be repeated every time you have sex. Spermicides do not require a prescription.   Intrauterine  contraception Intrauterine device (IUD) An IUD is a T-shaped device that is put in a woman's uterus. There are two types:  Hormone IUD.This type contains progestin, a synthetic form of the hormone progesterone. This type can stay in place for 3-5 years.  Copper IUD.This type is wrapped in copper wire. It can stay in place for 10 years. Permanent methods of contraception Female tubal ligation In this method, a woman's fallopian tubes are sealed, tied, or blocked during surgery to prevent eggs from traveling to the uterus. Hysteroscopic sterilization In this method, a small, flexible insert is placed into each fallopian tube. The inserts cause scar tissue to form in the fallopian tubes and block them, so sperm cannot reach an egg. The procedure takes about 3 months to be effective. Another form of birth control must be used during those 3 months. Female sterilization This is a procedure to tie off the tubes that carry sperm (vasectomy). After the procedure, the man can still ejaculate fluid (semen). Another form of birth control must be used for 3 months after the procedure. Natural planning methods Natural family planning In this method, a couple does not have sex on days when the woman could become pregnant. Calendar method In this method, the woman keeps track of the length of each menstrual cycle, identifies the days when pregnancy can happen, and does not have sex on those days. Ovulation method In this method, a couple avoids sex during ovulation. Symptothermal method This method involves not having sex during ovulation. The woman typically checks for ovulation by watching changes in her temperature and in the consistency of cervical mucus. Post-ovulation method In this method, a couple waits to have sex until after ovulation. Where to find more information  Centers for Disease Control and Prevention: www.cdc.gov Summary  Contraception, also called birth control, refers to methods or  devices that prevent pregnancy.  Hormonal methods of contraception include implants, injections, pills, patches, vaginal rings, and emergency contraceptives.  Barrier methods of contraception can include female condoms, female condoms, diaphragms, cervical caps, sponges, and spermicides.  There are two types of IUDs (intrauterine devices). An IUD can be put in a woman's uterus to prevent pregnancy for 3-5 years.  Permanent sterilization can be done through a procedure for males and females. Natural family planning methods involve nothaving sex on days when the woman could become pregnant. This information is not intended to replace advice given to you by your health care provider. Make sure you discuss any questions you have with your health care provider. Document Revised: 01/03/2020 Document Reviewed: 01/03/2020 Elsevier Patient Education  2021 [employer]   Breastfeeding  Choosing to breastfeed is one of the best decisions you can make for yourself and your baby. A change in hormones during pregnancy causes your breasts to make breast milk in your milk-producing glands. Hormones prevent breast milk from being released before your baby is born. They also prompt milk flow after birth. Once breastfeeding has begun, thoughts of your baby, as well as his or her sucking or crying, can stimulate the release of milk from your milk-producing glands. Benefits of breastfeeding Research shows that breastfeeding offers many health benefits   for infants and mothers. It also offers a cost-free and convenient way to feed your baby. For your baby  Your first milk (colostrum) helps your baby's digestive system to function better.  Special cells in your milk (antibodies) help your baby to fight off infections.  Breastfed babies are less likely to develop asthma, allergies, obesity, or type 2 diabetes. They are also at lower risk for sudden infant death syndrome (SIDS).  Nutrients in breast milk are better  able to meet your baby's needs compared to infant formula.  Breast milk improves your baby's brain development. For you  Breastfeeding helps to create a very special bond between you and your baby.  Breastfeeding is convenient. Breast milk costs nothing and is always available at the correct temperature.  Breastfeeding helps to burn calories. It helps you to lose the weight that you gained during pregnancy.  Breastfeeding makes your uterus return faster to its size before pregnancy. It also slows bleeding (lochia) after you give birth.  Breastfeeding helps to lower your risk of developing type 2 diabetes, osteoporosis, rheumatoid arthritis, cardiovascular disease, and breast, ovarian, uterine, and endometrial cancer later in life. Breastfeeding basics Starting breastfeeding  Find a comfortable place to sit or lie down, with your neck and back well-supported.  Place a pillow or a rolled-up blanket under your baby to bring him or her to the level of your breast (if you are seated). Nursing pillows are specially designed to help support your arms and your baby while you breastfeed.  Make sure that your baby's tummy (abdomen) is facing your abdomen.  Gently massage your breast. With your fingertips, massage from the outer edges of your breast inward toward the nipple. This encourages milk flow. If your milk flows slowly, you may need to continue this action during the feeding.  Support your breast with 4 fingers underneath and your thumb above your nipple (make the letter "C" with your hand). Make sure your fingers are well away from your nipple and your baby's mouth.  Stroke your baby's lips gently with your finger or nipple.  When your baby's mouth is open wide enough, quickly bring your baby to your breast, placing your entire nipple and as much of the areola as possible into your baby's mouth. The areola is the colored area around your nipple. ? More areola should be visible above your  baby's upper lip than below the lower lip. ? Your baby's lips should be opened and extended outward (flanged) to ensure an adequate, comfortable latch. ? Your baby's tongue should be between his or her lower gum and your breast.  Make sure that your baby's mouth is correctly positioned around your nipple (latched). Your baby's lips should create a seal on your breast and be turned out (everted).  It is common for your baby to suck about 2-3 minutes in order to start the flow of breast milk. Latching Teaching your baby how to latch onto your breast properly is very important. An improper latch can cause nipple pain, decreased milk supply, and poor weight gain in your baby. Also, if your baby is not latched onto your nipple properly, he or she may swallow some air during feeding. This can make your baby fussy. Burping your baby when you switch breasts during the feeding can help to get rid of the air. However, teaching your baby to latch on properly is still the best way to prevent fussiness from swallowing air while breastfeeding. Signs that your baby has successfully latched onto   your nipple  Silent tugging or silent sucking, without causing you pain. Infant's lips should be extended outward (flanged).  Swallowing heard between every 3-4 sucks once your milk has started to flow (after your let-down milk reflex occurs).  Muscle movement above and in front of his or her ears while sucking. Signs that your baby has not successfully latched onto your nipple  Sucking sounds or smacking sounds from your baby while breastfeeding.  Nipple pain. If you think your baby has not latched on correctly, slip your finger into the corner of your baby's mouth to break the suction and place it between your baby's gums. Attempt to start breastfeeding again. Signs of successful breastfeeding Signs from your baby  Your baby will gradually decrease the number of sucks or will completely stop sucking.  Your baby  will fall asleep.  Your baby's body will relax.  Your baby will retain a small amount of milk in his or her mouth.  Your baby will let go of your breast by himself or herself. Signs from you  Breasts that have increased in firmness, weight, and size 1-3 hours after feeding.  Breasts that are softer immediately after breastfeeding.  Increased milk volume, as well as a change in milk consistency and color by the fifth day of breastfeeding.  Nipples that are not sore, cracked, or bleeding. Signs that your baby is getting enough milk  Wetting at least 1-2 diapers during the first 24 hours after birth.  Wetting at least 5-6 diapers every 24 hours for the first week after birth. The urine should be clear or pale yellow by the age of 5 days.  Wetting 6-8 diapers every 24 hours as your baby continues to grow and develop.  At least 3 stools in a 24-hour period by the age of 5 days. The stool should be soft and yellow.  At least 3 stools in a 24-hour period by the age of 7 days. The stool should be seedy and yellow.  No loss of weight greater than 10% of birth weight during the first 3 days of life.  Average weight gain of 4-7 oz (113-198 g) per week after the age of 4 days.  Consistent daily weight gain by the age of 5 days, without weight loss after the age of 2 weeks. After a feeding, your baby may spit up a small amount of milk. This is normal. Breastfeeding frequency and duration Frequent feeding will help you make more milk and can prevent sore nipples and extremely full breasts (breast engorgement). Breastfeed when you feel the need to reduce the fullness of your breasts or when your baby shows signs of hunger. This is called "breastfeeding on demand." Signs that your baby is hungry include:  Increased alertness, activity, or restlessness.  Movement of the head from side to side.  Opening of the mouth when the corner of the mouth or cheek is stroked (rooting).  Increased  sucking sounds, smacking lips, cooing, sighing, or squeaking.  Hand-to-mouth movements and sucking on fingers or hands.  Fussing or crying. Avoid introducing a pacifier to your baby in the first 4-6 weeks after your baby is born. After this time, you may choose to use a pacifier. Research has shown that pacifier use during the first year of a baby's life decreases the risk of sudden infant death syndrome (SIDS). Allow your baby to feed on each breast as long as he or she wants. When your baby unlatches or falls asleep while feeding from the   first breast, offer the second breast. Because newborns are often sleepy in the first few weeks of life, you may need to awaken your baby to get him or her to feed. Breastfeeding times will vary from baby to baby. However, the following rules can serve as a guide to help you make sure that your baby is properly fed:  Newborns (babies 4 weeks of age or younger) may breastfeed every 1-3 hours.  Newborns should not go without breastfeeding for longer than 3 hours during the day or 5 hours during the night.  You should breastfeed your baby a minimum of 8 times in a 24-hour period. Breast milk pumping Pumping and storing breast milk allows you to make sure that your baby is exclusively fed your breast milk, even at times when you are unable to breastfeed. This is especially important if you go back to work while you are still breastfeeding, or if you are not able to be present during feedings. Your lactation consultant can help you find a method of pumping that works best for you and give you guidelines about how long it is safe to store breast milk.      Caring for your breasts while you breastfeed Nipples can become dry, cracked, and sore while breastfeeding. The following recommendations can help keep your breasts moisturized and healthy:  Avoid using soap on your nipples.  Wear a supportive bra designed especially for nursing. Avoid wearing underwire-style  bras or extremely tight bras (sports bras).  Air-dry your nipples for 3-4 minutes after each feeding.  Use only cotton bra pads to absorb leaked breast milk. Leaking of breast milk between feedings is normal.  Use lanolin on your nipples after breastfeeding. Lanolin helps to maintain your skin's normal moisture barrier. Pure lanolin is not harmful (not toxic) to your baby. You may also hand express a few drops of breast milk and gently massage that milk into your nipples and allow the milk to air-dry. In the first few weeks after giving birth, some women experience breast engorgement. Engorgement can make your breasts feel heavy, warm, and tender to the touch. Engorgement peaks within 3-5 days after you give birth. The following recommendations can help to ease engorgement:  Completely empty your breasts while breastfeeding or pumping. You may want to start by applying warm, moist heat (in the shower or with warm, water-soaked hand towels) just before feeding or pumping. This increases circulation and helps the milk flow. If your baby does not completely empty your breasts while breastfeeding, pump any extra milk after he or she is finished.  Apply ice packs to your breasts immediately after breastfeeding or pumping, unless this is too uncomfortable for you. To do this: ? Put ice in a plastic bag. ? Place a towel between your skin and the bag. ? Leave the ice on for 20 minutes, 2-3 times a day.  Make sure that your baby is latched on and positioned properly while breastfeeding. If engorgement persists after 48 hours of following these recommendations, contact your health care provider or a lactation consultant. Overall health care recommendations while breastfeeding  Eat 3 healthy meals and 3 snacks every day. Well-nourished mothers who are breastfeeding need an additional 450-500 calories a day. You can meet this requirement by increasing the amount of a balanced diet that you eat.  Drink  enough water to keep your urine pale yellow or clear.  Rest often, relax, and continue to take your prenatal vitamins to prevent fatigue, stress, and low   vitamin and mineral levels in your body (nutrient deficiencies).  Do not use any products that contain nicotine or tobacco, such as cigarettes and e-cigarettes. Your baby may be harmed by chemicals from cigarettes that pass into breast milk and exposure to secondhand smoke. If you need help quitting, ask your health care provider.  Avoid alcohol.  Do not use illegal drugs or marijuana.  Talk with your health care provider before taking any medicines. These include over-the-counter and prescription medicines as well as vitamins and herbal supplements. Some medicines that may be harmful to your baby can pass through breast milk.  It is possible to become pregnant while breastfeeding. If birth control is desired, ask your health care provider about options that will be safe while breastfeeding your baby. Where to find more information: [employer]: www.llli.org Contact a health care provider if:  You feel like you want to stop breastfeeding or have become frustrated with breastfeeding.  Your nipples are cracked or bleeding.  Your breasts are red, tender, or warm.  You have: ? Painful breasts or nipples. ? A swollen area on either breast. ? A fever or chills. ? Nausea or vomiting. ? Drainage other than breast milk from your nipples.  Your breasts do not become full before feedings by the fifth day after you give birth.  You feel sad and depressed.  Your baby is: ? Too sleepy to eat well. ? Having trouble sleeping. ? More than 1 week old and wetting fewer than 6 diapers in a 24-hour period. ? Not gaining weight by 5 days of age.  Your baby has fewer than 3 stools in a 24-hour period.  Your baby's skin or the white parts of his or her eyes become yellow. Get help right away if:  Your baby is overly tired  (lethargic) and does not want to wake up and feed.  Your baby develops an unexplained fever. Summary  Breastfeeding offers many health benefits for infant and mothers.  Try to breastfeed your infant when he or she shows early signs of hunger.  Gently tickle or stroke your baby's lips with your finger or nipple to allow the baby to open his or her mouth. Bring the baby to your breast. Make sure that much of the areola is in your baby's mouth. Offer one side and burp the baby before you offer the other side.  Talk with your health care provider or lactation consultant if you have questions or you face problems as you breastfeed. This information is not intended to replace advice given to you by your health care provider. Make sure you discuss any questions you have with your health care provider. Document Revised: 10/23/2017 Document Reviewed: 08/30/2016 Elsevier Patient Education  2021 [employer] Inc.  

## 2020-12-07 ENCOUNTER — Ambulatory Visit: Payer: Medicaid Other | Attending: Obstetrics

## 2020-12-07 ENCOUNTER — Encounter: Payer: Self-pay | Admitting: *Deleted

## 2020-12-07 ENCOUNTER — Ambulatory Visit: Payer: Medicaid Other | Admitting: *Deleted

## 2020-12-07 ENCOUNTER — Ambulatory Visit (HOSPITAL_BASED_OUTPATIENT_CLINIC_OR_DEPARTMENT_OTHER): Payer: Medicaid Other | Admitting: *Deleted

## 2020-12-07 VITALS — BP 110/65 | HR 95

## 2020-12-07 DIAGNOSIS — O365920 Maternal care for other known or suspected poor fetal growth, second trimester, not applicable or unspecified: Secondary | ICD-10-CM

## 2020-12-07 DIAGNOSIS — Z3A28 28 weeks gestation of pregnancy: Secondary | ICD-10-CM | POA: Diagnosis present

## 2020-12-07 DIAGNOSIS — O365930 Maternal care for other known or suspected poor fetal growth, third trimester, not applicable or unspecified: Secondary | ICD-10-CM | POA: Insufficient documentation

## 2020-12-07 DIAGNOSIS — Z3A27 27 weeks gestation of pregnancy: Secondary | ICD-10-CM | POA: Diagnosis not present

## 2020-12-07 LAB — RPR: RPR Ser Ql: NONREACTIVE

## 2020-12-07 LAB — CBC
Hematocrit: 29.1 % — ABNORMAL LOW (ref 34.0–46.6)
Hemoglobin: 9.7 g/dL — ABNORMAL LOW (ref 11.1–15.9)
MCH: 30.5 pg (ref 26.6–33.0)
MCHC: 33.3 g/dL (ref 31.5–35.7)
MCV: 92 fL (ref 79–97)
Platelets: 217 10*3/uL (ref 150–450)
RBC: 3.18 x10E6/uL — ABNORMAL LOW (ref 3.77–5.28)
RDW: 12.8 % (ref 11.7–15.4)
WBC: 8.7 10*3/uL (ref 3.4–10.8)

## 2020-12-07 LAB — GLUCOSE TOLERANCE, 2 HOURS W/ 1HR
Glucose, 1 hour: 56 mg/dL — ABNORMAL LOW (ref 65–179)
Glucose, 2 hour: 69 mg/dL (ref 65–152)
Glucose, Fasting: 73 mg/dL (ref 65–91)

## 2020-12-07 LAB — HIV ANTIBODY (ROUTINE TESTING W REFLEX): HIV Screen 4th Generation wRfx: NONREACTIVE

## 2020-12-07 NOTE — Procedures (Signed)
Frances [name] 01-11-1996 [redacted]w[redacted]d  Fetus A Non-Stress Test Interpretation for 12/07/20  Indication: IUGR  Fetal Heart Rate A Mode: External Baseline Rate (A): 145 bpm Variability: Moderate Accelerations: 15 x 15 Decelerations: Variable Multiple birth?: No  Uterine Activity Mode: Palpation,Toco Contraction Frequency (min): none Resting Tone Palpated: Relaxed Resting Time: Adequate  Interpretation (Fetal Testing) Nonstress Test Interpretation: Reactive Overall Impression: Reassuring for gestational age Comments: Dr. Parke Poisson reviewed tracing.

## 2020-12-08 ENCOUNTER — Other Ambulatory Visit: Payer: Self-pay | Admitting: *Deleted

## 2020-12-08 DIAGNOSIS — O365990 Maternal care for other known or suspected poor fetal growth, unspecified trimester, not applicable or unspecified: Secondary | ICD-10-CM

## 2020-12-10 DIAGNOSIS — O365920 Maternal care for other known or suspected poor fetal growth, second trimester, not applicable or unspecified: Secondary | ICD-10-CM

## 2020-12-10 DIAGNOSIS — Z3A27 27 weeks gestation of pregnancy: Secondary | ICD-10-CM | POA: Diagnosis not present

## 2020-12-13 ENCOUNTER — Ambulatory Visit (HOSPITAL_BASED_OUTPATIENT_CLINIC_OR_DEPARTMENT_OTHER): Payer: Medicaid Other | Admitting: *Deleted

## 2020-12-13 ENCOUNTER — Ambulatory Visit: Payer: Medicaid Other | Attending: Obstetrics

## 2020-12-13 ENCOUNTER — Encounter: Payer: Self-pay | Admitting: *Deleted

## 2020-12-13 ENCOUNTER — Other Ambulatory Visit: Payer: Self-pay

## 2020-12-13 ENCOUNTER — Ambulatory Visit: Payer: Medicaid Other | Admitting: *Deleted

## 2020-12-13 VITALS — BP 115/59 | HR 91

## 2020-12-13 DIAGNOSIS — O365930 Maternal care for other known or suspected poor fetal growth, third trimester, not applicable or unspecified: Secondary | ICD-10-CM | POA: Diagnosis not present

## 2020-12-13 DIAGNOSIS — O365920 Maternal care for other known or suspected poor fetal growth, second trimester, not applicable or unspecified: Secondary | ICD-10-CM | POA: Insufficient documentation

## 2020-12-13 DIAGNOSIS — Z3A28 28 weeks gestation of pregnancy: Secondary | ICD-10-CM | POA: Diagnosis not present

## 2020-12-13 DIAGNOSIS — O365931 Maternal care for other known or suspected poor fetal growth, third trimester, fetus 1: Secondary | ICD-10-CM | POA: Insufficient documentation

## 2020-12-13 NOTE — Procedures (Signed)
Frances [name] 12/31/1995 [redacted]w[redacted]d  Fetus A Non-Stress Test Interpretation for 12/13/20  Indication: IUGR  Fetal Heart Rate A Mode: External Baseline Rate (A): 145 bpm Variability: Moderate Accelerations: 15 x 15 Decelerations: Variable Multiple birth?: No  Uterine Activity Mode: Palpation,Toco Contraction Frequency (min): none Resting Tone Palpated: Relaxed Resting Time: Adequate  Interpretation (Fetal Testing) Nonstress Test Interpretation: Reactive Overall Impression: Reassuring for gestational age Comments: Dr. Judeth Cornfield reviewed tracing.

## 2020-12-20 ENCOUNTER — Ambulatory Visit (INDEPENDENT_AMBULATORY_CARE_PROVIDER_SITE_OTHER): Payer: Medicaid Other | Admitting: Obstetrics & Gynecology

## 2020-12-20 ENCOUNTER — Other Ambulatory Visit: Payer: Self-pay

## 2020-12-20 VITALS — BP 120/62 | HR 90 | Wt 179.0 lb

## 2020-12-20 DIAGNOSIS — Z3481 Encounter for supervision of other normal pregnancy, first trimester: Secondary | ICD-10-CM

## 2020-12-20 DIAGNOSIS — O365930 Maternal care for other known or suspected poor fetal growth, third trimester, not applicable or unspecified: Secondary | ICD-10-CM

## 2020-12-20 NOTE — Progress Notes (Signed)
   PRENATAL VISIT NOTE  Subjective:  Frances [name] is a 25 y.o. G3P2002 at [redacted]w[redacted]d being seen today for ongoing prenatal care.  She is currently monitored for the following issues for this high-risk pregnancy and has History of recurrent UTIs; Gallstones; History of asthma; History of seizure disorder; History of kidney disease as a child; Breast nodule; Anemia; Lesion of skin of scalp; Supervision of normal intrauterine pregnancy in multigravida in first trimester; and COVID-19 on their problem list.  Patient reports no complaints.  Contractions: Not present. Vag. Bleeding: None.  Movement: Present. Denies leaking of fluid.   The following portions of the patient's history were reviewed and updated as appropriate: allergies, current medications, past family history, past medical history, past social history, past surgical history and problem list.   Objective:   Vitals:   12/20/20 1118  BP: 120/62  Pulse: 90  Weight: 179 lb (81.2 kg)    Fetal Status: Fetal Heart Rate (bpm): 155   Movement: Present     General:  Alert, oriented and cooperative. Patient is in no acute distress.  Skin: Skin is warm and dry. No rash noted.   Cardiovascular: Normal heart rate noted  Respiratory: Normal respiratory effort, no problems with respiration noted  Abdomen: Soft, gravid, appropriate for gestational age.  Pain/Pressure: Absent     Pelvic: Cervical exam deferred        Extremities: Normal range of motion.  Edema: None  Mental Status: Normal mood and affect. Normal behavior. Normal judgment and thought content.   Assessment and Plan:  Pregnancy: G3P2002 at [redacted]w[redacted]d 1. Supervision of normal intrauterine pregnancy in multigravida in first trimester Passed GTT  2. Poor fetal growth affecting management of mother in third trimester, single or unspecified fetus Followed by MFM for IUGR, dopplers are nl  Preterm labor symptoms and general obstetric precautions including but not limited to vaginal  bleeding, contractions, leaking of fluid and fetal movement were reviewed in detail with the patient. Please refer to After Visit Summary for other counseling recommendations.   Return in about 2 weeks (around 01/03/2021).  Future Appointments  Date Time Provider Department Center  12/22/2020  9:15 AM [facility] NURSE [facility]  12/22/2020  9:30 AM [facility] US3 [facility]  12/22/2020 10:45 AM [facility] NST [facility]  12/28/2020 12:45 PM [facility] NURSE [facility]  12/28/2020  1:00 PM [facility] US1 [facility]  12/28/2020  2:15 PM [facility] NST [facility]  01/04/2021  1:15 PM [facility] NURSE [facility]  01/04/2021  1:30 PM [facility] US3 [facility]  01/04/2021  2:15 PM [facility] NST [facility]    Scheryl Darter, MD

## 2020-12-20 NOTE — Patient Instructions (Signed)

## 2020-12-22 ENCOUNTER — Ambulatory Visit (HOSPITAL_BASED_OUTPATIENT_CLINIC_OR_DEPARTMENT_OTHER): Payer: Medicaid Other | Admitting: *Deleted

## 2020-12-22 ENCOUNTER — Encounter: Payer: Self-pay | Admitting: *Deleted

## 2020-12-22 ENCOUNTER — Other Ambulatory Visit: Payer: Self-pay

## 2020-12-22 ENCOUNTER — Ambulatory Visit: Payer: Medicaid Other | Admitting: *Deleted

## 2020-12-22 ENCOUNTER — Ambulatory Visit: Payer: Medicaid Other | Attending: Obstetrics

## 2020-12-22 VITALS — BP 113/60 | HR 92

## 2020-12-22 DIAGNOSIS — Z3A30 30 weeks gestation of pregnancy: Secondary | ICD-10-CM

## 2020-12-22 DIAGNOSIS — O365990 Maternal care for other known or suspected poor fetal growth, unspecified trimester, not applicable or unspecified: Secondary | ICD-10-CM | POA: Insufficient documentation

## 2020-12-22 DIAGNOSIS — O365930 Maternal care for other known or suspected poor fetal growth, third trimester, not applicable or unspecified: Secondary | ICD-10-CM | POA: Diagnosis not present

## 2020-12-22 NOTE — Procedures (Signed)
Frances [name] 1996/04/12 [redacted]w[redacted]d  Fetus A Non-Stress Test Interpretation for 12/22/20  Indication: IUGR  Fetal Heart Rate A Mode: External Baseline Rate (A): 155 bpm Variability: Moderate Accelerations: 15 x 15 Decelerations: None Multiple birth?: No  Uterine Activity Mode: Palpation,Toco Contraction Frequency (min): None Resting Tone Palpated: Relaxed Resting Time: Adequate  Interpretation (Fetal Testing) Nonstress Test Interpretation: Reactive Comments: Dr. Parke Poisson reviewed tracing.

## 2020-12-28 ENCOUNTER — Encounter: Payer: Self-pay | Admitting: *Deleted

## 2020-12-28 ENCOUNTER — Ambulatory Visit: Payer: Medicaid Other | Admitting: *Deleted

## 2020-12-28 ENCOUNTER — Other Ambulatory Visit: Payer: Self-pay

## 2020-12-28 ENCOUNTER — Ambulatory Visit (HOSPITAL_BASED_OUTPATIENT_CLINIC_OR_DEPARTMENT_OTHER): Payer: Medicaid Other | Admitting: *Deleted

## 2020-12-28 ENCOUNTER — Ambulatory Visit: Payer: Medicaid Other | Attending: Obstetrics

## 2020-12-28 VITALS — BP 117/61 | HR 89

## 2020-12-28 DIAGNOSIS — O365930 Maternal care for other known or suspected poor fetal growth, third trimester, not applicable or unspecified: Secondary | ICD-10-CM | POA: Diagnosis not present

## 2020-12-28 DIAGNOSIS — Z3A30 30 weeks gestation of pregnancy: Secondary | ICD-10-CM

## 2020-12-28 DIAGNOSIS — O365931 Maternal care for other known or suspected poor fetal growth, third trimester, fetus 1: Secondary | ICD-10-CM | POA: Diagnosis present

## 2020-12-28 DIAGNOSIS — O365990 Maternal care for other known or suspected poor fetal growth, unspecified trimester, not applicable or unspecified: Secondary | ICD-10-CM | POA: Diagnosis present

## 2020-12-28 NOTE — Procedures (Signed)
Frances [name] 12/09/1995 [redacted]w[redacted]d  Fetus A Non-Stress Test Interpretation for 12/28/20  Indication: IUGR  Fetal Heart Rate A Mode: External Baseline Rate (A): 145 bpm Variability: Moderate Accelerations: 15 x 15 Decelerations: None Multiple birth?: No  Uterine Activity Mode: Palpation,Toco Resting Tone Palpated: Relaxed Resting Time: Adequate  Interpretation (Fetal Testing) Nonstress Test Interpretation: Reactive Comments: Dr. Parke Poisson reviewed tracing

## 2021-01-03 ENCOUNTER — Other Ambulatory Visit: Payer: Self-pay

## 2021-01-03 ENCOUNTER — Ambulatory Visit (INDEPENDENT_AMBULATORY_CARE_PROVIDER_SITE_OTHER): Payer: Medicaid Other | Admitting: Certified Nurse Midwife

## 2021-01-03 VITALS — BP 103/68 | HR 98 | Wt 185.1 lb

## 2021-01-03 DIAGNOSIS — Z3A31 31 weeks gestation of pregnancy: Secondary | ICD-10-CM

## 2021-01-03 DIAGNOSIS — Z3493 Encounter for supervision of normal pregnancy, unspecified, third trimester: Secondary | ICD-10-CM

## 2021-01-03 NOTE — Patient Instructions (Signed)

## 2021-01-03 NOTE — Progress Notes (Signed)
Patient stated that she woke up this morning to pelvic pain. Stated that it did not feel like "cramps" but unusual pain that lasted about 20 minutes and made it hard for her to get out of bed

## 2021-01-03 NOTE — Progress Notes (Signed)
   PRENATAL VISIT NOTE  Subjective:  Frances [name] is a 25 y.o. G3P2002 at [redacted]w[redacted]d being seen today for ongoing prenatal care.  She is currently monitored for the following issues for this low-risk pregnancy and has History of recurrent UTIs; Gallstones; History of asthma; History of seizure disorder; History of kidney disease as a child; Breast nodule; Anemia; Lesion of skin of scalp; Supervision of normal intrauterine pregnancy in multigravida in first trimester; and COVID-19 on their problem list.  Patient reports one episode of mid-lower abdominal pain this morning upon standing. Did not feel like cramping, just a sharp pain between her pubic bone and belly button that made it difficult to stand and walk around for about then completely abated .  Contractions: Not present. Vag. Bleeding: None.  Movement: Present. Denies leaking of fluid.   The following portions of the patient's history were reviewed and updated as appropriate: allergies, current medications, past family history, past medical history, past social history, past surgical history and problem list.   Objective:   Vitals:   01/03/21 0922  BP: 103/68  Pulse: 98  Weight: 185 lb 1.6 oz (84 kg)    Fetal Status: Fetal Heart Rate (bpm): 145 Fundal Height: 31 cm Movement: Present     General:  Alert, oriented and cooperative. Patient is in no acute distress.  Skin: Skin is warm and dry. No rash noted.   Cardiovascular: Normal heart rate noted  Respiratory: Normal respiratory effort, no problems with respiration noted  Abdomen: Soft, gravid, appropriate for gestational age.  Pain/Pressure: Present     Pelvic: Cervical exam deferred        Extremities: Normal range of motion.  Edema: None  Mental Status: Normal mood and affect. Normal behavior. Normal judgment and thought content.   Assessment and Plan:  Pregnancy: G3P2002 at [redacted]w[redacted]d 1. Supervision of low-risk pregnancy, third trimester - Doing well overall, feeling  vigorous fetal movement  2. [redacted] weeks gestation of pregnancy - Routine OB care - Reviewed common causes of lower abdominal pain and warning signs that warrant immediate follow up in MAU. Likely musculoskeletal in nature or gas pains. Pt aware if it happens again and does not ease quickly to present to MAU for evaluation.  Preterm labor symptoms and general obstetric precautions including but not limited to vaginal bleeding, contractions, leaking of fluid and fetal movement were reviewed in detail with the patient. Please refer to After Visit Summary for other counseling recommendations.   Return in about 2 weeks (around 01/17/2021) for IN-PERSON, LOB.  Future Appointments  Date Time Provider Department Center  01/04/2021  1:15 PM [facility] NURSE [facility]  01/04/2021  1:30 PM [facility] US3 [facility]  01/04/2021  2:15 PM [facility] NST [facility]  01/17/2021  2:15 PM Reva Bores, MD [facility]    Bernerd Limbo, CNM

## 2021-01-04 ENCOUNTER — Ambulatory Visit: Payer: Medicaid Other | Admitting: *Deleted

## 2021-01-04 ENCOUNTER — Encounter: Payer: Self-pay | Admitting: *Deleted

## 2021-01-04 ENCOUNTER — Ambulatory Visit: Payer: Medicaid Other | Attending: Obstetrics

## 2021-01-04 ENCOUNTER — Telehealth: Payer: Self-pay

## 2021-01-04 ENCOUNTER — Other Ambulatory Visit: Payer: Self-pay | Admitting: Obstetrics

## 2021-01-04 ENCOUNTER — Ambulatory Visit: Payer: Medicaid Other

## 2021-01-04 VITALS — BP 111/67 | HR 99

## 2021-01-04 DIAGNOSIS — O365990 Maternal care for other known or suspected poor fetal growth, unspecified trimester, not applicable or unspecified: Secondary | ICD-10-CM | POA: Diagnosis present

## 2021-01-04 DIAGNOSIS — Z3A31 31 weeks gestation of pregnancy: Secondary | ICD-10-CM

## 2021-01-04 DIAGNOSIS — O365930 Maternal care for other known or suspected poor fetal growth, third trimester, not applicable or unspecified: Secondary | ICD-10-CM

## 2021-01-05 ENCOUNTER — Other Ambulatory Visit: Payer: Self-pay | Admitting: *Deleted

## 2021-01-05 DIAGNOSIS — O365990 Maternal care for other known or suspected poor fetal growth, unspecified trimester, not applicable or unspecified: Secondary | ICD-10-CM

## 2021-01-11 ENCOUNTER — Ambulatory Visit: Payer: Medicaid Other | Attending: Obstetrics

## 2021-01-11 ENCOUNTER — Ambulatory Visit: Payer: Medicaid Other | Admitting: *Deleted

## 2021-01-11 ENCOUNTER — Encounter: Payer: Self-pay | Admitting: *Deleted

## 2021-01-11 ENCOUNTER — Other Ambulatory Visit: Payer: Self-pay

## 2021-01-11 VITALS — BP 123/61 | HR 92

## 2021-01-11 DIAGNOSIS — Z3A32 32 weeks gestation of pregnancy: Secondary | ICD-10-CM

## 2021-01-11 DIAGNOSIS — O365930 Maternal care for other known or suspected poor fetal growth, third trimester, not applicable or unspecified: Secondary | ICD-10-CM | POA: Insufficient documentation

## 2021-01-11 DIAGNOSIS — O365990 Maternal care for other known or suspected poor fetal growth, unspecified trimester, not applicable or unspecified: Secondary | ICD-10-CM | POA: Insufficient documentation

## 2021-01-16 ENCOUNTER — Inpatient Hospital Stay (HOSPITAL_COMMUNITY)
Admission: AD | Admit: 2021-01-16 | Discharge: 2021-01-16 | Disposition: A | Discharge status: Home / Self Care | Payer: Medicaid Other | Attending: Obstetrics and Gynecology | Admitting: Obstetrics and Gynecology

## 2021-01-16 ENCOUNTER — Encounter (HOSPITAL_COMMUNITY): Payer: Self-pay | Admitting: Obstetrics and Gynecology

## 2021-01-16 ENCOUNTER — Other Ambulatory Visit: Payer: Self-pay

## 2021-01-16 DIAGNOSIS — O479 False labor, unspecified: Secondary | ICD-10-CM | POA: Diagnosis not present

## 2021-01-16 DIAGNOSIS — Z881 Allergy status to other antibiotic agents status: Secondary | ICD-10-CM | POA: Diagnosis not present

## 2021-01-16 DIAGNOSIS — M549 Dorsalgia, unspecified: Secondary | ICD-10-CM | POA: Insufficient documentation

## 2021-01-16 DIAGNOSIS — O99891 Other specified diseases and conditions complicating pregnancy: Secondary | ICD-10-CM | POA: Insufficient documentation

## 2021-01-16 DIAGNOSIS — O365930 Maternal care for other known or suspected poor fetal growth, third trimester, not applicable or unspecified: Secondary | ICD-10-CM | POA: Diagnosis not present

## 2021-01-16 DIAGNOSIS — Z3A33 33 weeks gestation of pregnancy: Secondary | ICD-10-CM | POA: Insufficient documentation

## 2021-01-16 DIAGNOSIS — Z8759 Personal history of other complications of pregnancy, childbirth and the puerperium: Secondary | ICD-10-CM | POA: Insufficient documentation

## 2021-01-16 LAB — URINALYSIS, ROUTINE W REFLEX MICROSCOPIC
Bilirubin Urine: NEGATIVE
Glucose, UA: NEGATIVE mg/dL
Hgb urine dipstick: NEGATIVE
Ketones, ur: NEGATIVE mg/dL
Nitrite: NEGATIVE
Protein, ur: NEGATIVE mg/dL
Specific Gravity, Urine: 1.014 (ref 1.005–1.030)
pH: 6 (ref 5.0–8.0)

## 2021-01-16 MED ORDER — CYCLOBENZAPRINE HCL 5 MG PO TABS
10.0000 mg | ORAL_TABLET | Freq: Once | ORAL | Status: AC
  Administered 2021-01-16: 10 mg via ORAL
  Filled 2021-01-16: qty 2

## 2021-01-16 MED ORDER — CYCLOBENZAPRINE HCL 10 MG PO TABS: 10.0000 mg | ORAL_TABLET | Freq: Two times a day (BID) | ORAL | 0 refills | Status: DC | PRN

## 2021-01-16 NOTE — MAU Provider Note (Addendum)
History     CSN: [id number]  Arrival date and time: 01/16/21 1642   Event Date/Time   First Provider Initiated Contact with Patient 01/16/21 1737      Chief Complaint  Patient presents with  . Back Pain   [name] is a 25 y.o. year old G51P2002 female at [redacted]w[redacted]d weeks gestation who arrives  to MAU via EMS reporting lower back pain that started this morning, but now advanced to mid-back. She rates the pain a 7/10. She is unsure if she is having UC's. She reports having lower abdominal cramping earlier, but none now. Her pregnancy is high risk and complicated by h/o precipitous delivery at home with last baby and IUGR in current pregnancy. She reports (+) FM. She denies VB or LOF. She receives [facility].   OB History    Gravida  3   Para  2   Term  2   Preterm  0   AB  0   Living  2     SAB  0   IAB  0   Ectopic  0   Multiple  0   Live Births  2           Past Medical History:  Diagnosis Date  . Anemia    on Iron supplements  . Asthma 1999   as a child; no current problems  . Chronic kidney disease    as a child  . Constipation   . Gall stones   . Gallstones 10/23/2016   10/2016: Seen by [facility], pt states, and plans for PP follow up. Advised her on low fat diet  . Seizures (HCC)    seizure disorder as a child, treated with medication; none since  . UTI (lower urinary tract infection)     Past Surgical History:  Procedure Laterality Date  . BACK SURGERY    . IRRIGATION AND DEBRIDEMENT ABSCESS Left 06/11/2017   Procedure: IRRIGATION, DRAINAGE AND DEBRIDEMENT LEFT FLANK NECROTIZING SOFT TISSUE INFECTION;  Surgeon: Avel Peace, MD;  Location: [facility];  Service: General;  Laterality: Left;  . TYMPANOSTOMY TUBE PLACEMENT    . WOUND DEBRIDEMENT Left 06/12/2017   Procedure: DRESSING CHANGE AND DEBRIDEMENT OF WOUND;  Surgeon: Avel Peace, MD;  Location: [facility];  Service: General;  Laterality: Left;    Family History  Problem Relation Age of  Onset  . Asthma Mother   . Hypertension Mother   . Asthma Sister   . Birth defects Sister        Down's syndrome  . Intellectual disability Father     Social History   Tobacco Use  . Smoking status: Never Smoker  . Smokeless tobacco: Never Used  Vaping Use  . Vaping Use: Never used  Substance Use Topics  . Alcohol use: No  . Drug use: Not Currently    Frequency: 2.0 times per week    Types: Marijuana    Comment: last used 05/2018    Allergies:  Allergies  Allergen Reactions  . Vancomycin Other (See Comments)    Nephrotoxicity    Medications Prior to Admission  Medication Sig Dispense Refill Last Dose  . ferrous sulfate 325 (65 FE) MG tablet Take 1 tablet (325 mg total) by mouth every other day. 30 tablet 3 01/15/2021 at Unknown time  . metoCLOPramide (REGLAN) 10 MG tablet Take 1 tablet (10 mg total) by mouth 4 (four) times daily as needed for nausea or vomiting. 30 tablet 2 Past Month at  Unknown time  . ondansetron (ZOFRAN ODT) 4 MG disintegrating tablet Take 1 tablet (4 mg total) by mouth every 8 (eight) hours as needed for nausea or vomiting. 20 tablet 0 Past Month at Unknown time  . Prenatal Vit-Fe Fumarate-FA (PRENATAL MULTIVITAMIN) TABS tablet Take 1 tablet by mouth daily at 12 noon.   01/15/2021 at Unknown time  . Blood Pressure Monitoring DEVI 1 each by Does not apply route once a week. (Patient not taking: No sig reported) 1 each 0     Review of Systems  Constitutional: Negative.   HENT: Negative.   Eyes: Negative.   Respiratory: Negative.   Cardiovascular: Negative.   Gastrointestinal: Negative.   Endocrine: Negative.   Genitourinary: Negative.  Negative for pelvic pain (lower abdominal pain, but none now).  Musculoskeletal: Positive for back pain.  Skin: Negative.   Allergic/Immunologic: Negative.   Neurological: Negative.   Hematological: Negative.   Psychiatric/Behavioral: Negative.    Physical Exam   Blood pressure 127/67, pulse (!) 52, temperature  98.8 F (37.1 C), temperature source Oral, resp. rate 16, last menstrual period 05/26/2020, SpO2 100 %, not currently breastfeeding.  Physical Exam Vitals and nursing note reviewed. Exam conducted with a chaperone present.  Constitutional:      Appearance: Normal appearance. She is normal weight.  HENT:     Head: Normocephalic and atraumatic.  Pulmonary:     Effort: Pulmonary effort is normal.  Genitourinary:    General: Normal vulva.     Comments: Dilation: Closed Exam by:: Carloyn Jaeger CNM  Skin:    General: Skin is warm and dry.  Neurological:     Mental Status: She is alert and oriented to person, place, and time.  Psychiatric:        Mood and Affect: Mood normal.        Behavior: Behavior normal.        Thought Content: Thought content normal.        Judgment: Judgment normal.    REACTIVE NST - FHR: 145 bpm / moderate variability / accels present / decels absent / TOCO: irregular every 15 mins   MAU Course  Procedures  MDM CCUA Flexeril 10 mg -- improved pain; down from 7/10 to 4/10 -- patient asleep at reassessment @ 1935  Results for orders placed or performed during the hospital encounter of 01/16/21 (from the past 24 hour(s))  Urinalysis, Routine w reflex microscopic Urine, Clean Catch     Status: Abnormal   Collection Time: 01/16/21  5:07 PM  Result Value Ref Range   Color, Urine YELLOW YELLOW   APPearance HAZY (A) CLEAR   Specific Gravity, Urine 1.014 1.005 - 1.030   pH 6.0 5.0 - 8.0   Glucose, UA NEGATIVE NEGATIVE mg/dL   Hgb urine dipstick NEGATIVE NEGATIVE   Bilirubin Urine NEGATIVE NEGATIVE   Ketones, ur NEGATIVE NEGATIVE mg/dL   Protein, ur NEGATIVE NEGATIVE mg/dL   Nitrite NEGATIVE NEGATIVE   Leukocytes,Ua TRACE (A) NEGATIVE   RBC / HPF 0-5 0 - 5 RBC/hpf   WBC, UA 6-10 0 - 5 WBC/hpf   Bacteria, UA FEW (A) NONE SEEN   Squamous Epithelial / LPF 6-10 0 - 5   Mucus PRESENT     Assessment and Plan  Back pain affecting pregnancy in third trimester  -  Information provided on back pain in pregnancy - Rx for Flexeril 10 mg BID prn back pain   [redacted] weeks gestation of pregnancy   - Discharge patient - Keep scheduled  appt with [facility] on 01/17/2021 - Patient verbalized an understanding of the plan of care and agrees.   Raelyn Mora, CNM 01/16/2021, 5:37 PM

## 2021-01-16 NOTE — MAU Note (Signed)
Pt arrived via EMS c/o back pain that started this morning. Rating pain 7/10. Does not know if cntx. Denies VB, LOF. +fm. Is being monitored for IUGR. Had last baby at home due to precipitous labor.

## 2021-01-16 NOTE — Discharge Instructions (Signed)
You have been prescribed Flexeril (cyclobenzaprine) 10 mg as needed for pain.

## 2021-01-17 ENCOUNTER — Ambulatory Visit (INDEPENDENT_AMBULATORY_CARE_PROVIDER_SITE_OTHER): Payer: Medicaid Other | Admitting: Family Medicine

## 2021-01-17 VITALS — BP 103/63 | HR 99 | Wt 186.0 lb

## 2021-01-17 DIAGNOSIS — O0990 Supervision of high risk pregnancy, unspecified, unspecified trimester: Secondary | ICD-10-CM

## 2021-01-17 DIAGNOSIS — O365990 Maternal care for other known or suspected poor fetal growth, unspecified trimester, not applicable or unspecified: Secondary | ICD-10-CM | POA: Insufficient documentation

## 2021-01-17 DIAGNOSIS — O365930 Maternal care for other known or suspected poor fetal growth, third trimester, not applicable or unspecified: Secondary | ICD-10-CM

## 2021-01-17 NOTE — Progress Notes (Signed)
   PRENATAL VISIT NOTE  Subjective:  Frances [name] is a 25 y.o. G3P2002 at [redacted]w[redacted]d being seen today for ongoing prenatal care.  She is currently monitored for the following issues for this high-risk pregnancy and has History of recurrent UTIs; History of asthma; History of seizure disorder; History of kidney disease as a child; Breast nodule; Anemia; Lesion of skin of scalp; Supervision of high risk pregnancy, antepartum; COVID-19; and Maternal care for poor fetal growth on their problem list.  Patient reports that she was seen in the MAU last night for lower back pain that she rates was a 7/10. She was prescribed Flexiril that provided good pain control. She denies any new lower back pain today.  Contractions: Not present. Vag. Bleeding: None.  Movement: Present. Denies leaking of fluid.   The following portions of the patient's history were reviewed and updated as appropriate: allergies, current medications, past family history, past medical history, past social history, past surgical history and problem list.   Objective:   Vitals:   01/17/21 1427  BP: 103/63  Pulse: 99  Weight: 186 lb (84.4 kg)    Fetal Status: Fetal Heart Rate (bpm): 142 Fundal Height: 33 cm Movement: Present     General:  Alert, oriented and cooperative. Patient is in no acute distress.  Skin: Skin is warm and dry. No rash noted.   Cardiovascular: Normal heart rate noted  Respiratory: Normal respiratory effort, no problems with respiration noted  Abdomen: Soft, gravid, appropriate for gestational age.  Pain/Pressure: Present     Pelvic: Cervical exam deferred        Extremities: Normal range of motion.  Edema: None  Mental Status: Normal mood and affect. Normal behavior. Normal judgment and thought content.   Assessment and Plan:  Pregnancy: G3P2002 at [redacted]w[redacted]d 1. Supervision of high risk pregnancy, antepartum Patient is doing well overall, states that she feels fetal movement.   2. Maternal care for poor fetal  growth in third trimester, single or unspecified fetus Follow-up US on 5/19 EFW measures at 11th percentile, with abdominal circumference at the 3rd percentile. which indicates borderline fetal growth restriction. Current recommendations include weekly fetal BPP testing and umbilical artery doppler studies. Discussed with patient in the event that IUGR persists would recommend IOL at 38-39 weeks.    Preterm labor symptoms and general obstetric precautions including but not limited to vaginal bleeding, contractions, leaking of fluid and fetal movement were reviewed in detail with the patient. Please refer to After Visit Summary for other counseling recommendations.   Return in 2 weeks (on 01/31/2021) for needs MD, [facility], in person.  Future Appointments  Date Time Provider Department Center  01/18/2021  2:45 PM [facility] NURSE [facility]  01/18/2021  3:00 PM [facility] US1 [facility]  01/25/2021  1:15 PM [facility] NURSE [facility]  01/25/2021  1:30 PM [facility] US3 [facility]  02/01/2021 10:30 AM [facility] NURSE [facility]  02/01/2021 10:45 AM [facility] US5 [facility]  02/01/2021  3:15 PM Federico Flake, MD [facility]    Edmon Crape, Student-PA 01/17/2021  3:34 PM

## 2021-01-17 NOTE — Patient Instructions (Signed)

## 2021-01-18 ENCOUNTER — Ambulatory Visit: Payer: Medicaid Other | Attending: Obstetrics

## 2021-01-18 ENCOUNTER — Ambulatory Visit: Payer: Medicaid Other | Admitting: *Deleted

## 2021-01-18 ENCOUNTER — Other Ambulatory Visit: Payer: Self-pay

## 2021-01-18 ENCOUNTER — Encounter: Payer: Self-pay | Admitting: *Deleted

## 2021-01-18 VITALS — BP 109/68 | HR 93

## 2021-01-18 DIAGNOSIS — Z3A33 33 weeks gestation of pregnancy: Secondary | ICD-10-CM | POA: Diagnosis not present

## 2021-01-18 DIAGNOSIS — O0990 Supervision of high risk pregnancy, unspecified, unspecified trimester: Secondary | ICD-10-CM | POA: Diagnosis present

## 2021-01-18 DIAGNOSIS — O365930 Maternal care for other known or suspected poor fetal growth, third trimester, not applicable or unspecified: Secondary | ICD-10-CM | POA: Diagnosis not present

## 2021-01-18 DIAGNOSIS — O365990 Maternal care for other known or suspected poor fetal growth, unspecified trimester, not applicable or unspecified: Secondary | ICD-10-CM | POA: Diagnosis present

## 2021-01-19 ENCOUNTER — Other Ambulatory Visit: Payer: Self-pay | Admitting: *Deleted

## 2021-01-19 DIAGNOSIS — O365990 Maternal care for other known or suspected poor fetal growth, unspecified trimester, not applicable or unspecified: Secondary | ICD-10-CM

## 2021-01-25 ENCOUNTER — Encounter: Payer: Self-pay | Admitting: *Deleted

## 2021-01-25 ENCOUNTER — Other Ambulatory Visit: Payer: Self-pay | Admitting: *Deleted

## 2021-01-25 ENCOUNTER — Ambulatory Visit: Payer: Medicaid Other | Attending: Obstetrics

## 2021-01-25 ENCOUNTER — Ambulatory Visit: Payer: Medicaid Other | Admitting: *Deleted

## 2021-01-25 ENCOUNTER — Other Ambulatory Visit: Payer: Self-pay

## 2021-01-25 ENCOUNTER — Other Ambulatory Visit: Payer: Self-pay | Admitting: Obstetrics

## 2021-01-25 VITALS — BP 119/61 | HR 96

## 2021-01-25 DIAGNOSIS — O283 Abnormal ultrasonic finding on antenatal screening of mother: Secondary | ICD-10-CM

## 2021-01-25 DIAGNOSIS — O365930 Maternal care for other known or suspected poor fetal growth, third trimester, not applicable or unspecified: Secondary | ICD-10-CM

## 2021-01-25 DIAGNOSIS — O0990 Supervision of high risk pregnancy, unspecified, unspecified trimester: Secondary | ICD-10-CM

## 2021-01-25 DIAGNOSIS — Z3A34 34 weeks gestation of pregnancy: Secondary | ICD-10-CM

## 2021-01-25 DIAGNOSIS — O365990 Maternal care for other known or suspected poor fetal growth, unspecified trimester, not applicable or unspecified: Secondary | ICD-10-CM

## 2021-01-25 NOTE — Procedures (Signed)
Frances [name] 1996/01/06 [redacted]w[redacted]d  Fetus A Non-Stress Test Interpretation for 01/25/21  Indication:  Elevated Dopplers  Fetal Heart Rate A Mode: External Baseline Rate (A): 155 bpm Variability: Moderate Accelerations: 15 x 15 Decelerations: None Multiple birth?: No  Uterine Activity Mode: Palpation, Toco Contraction Frequency (min): none Resting Tone Palpated: Relaxed Resting Time: Adequate  Interpretation (Fetal Testing) Nonstress Test Interpretation: Reactive Overall Impression: Reassuring for gestational age Comments: Dr. Judeth Cornfield reviewed tracing.

## 2021-02-01 ENCOUNTER — Ambulatory Visit (HOSPITAL_BASED_OUTPATIENT_CLINIC_OR_DEPARTMENT_OTHER): Payer: Medicaid Other | Admitting: *Deleted

## 2021-02-01 ENCOUNTER — Ambulatory Visit (INDEPENDENT_AMBULATORY_CARE_PROVIDER_SITE_OTHER): Payer: Medicaid Other | Admitting: Obstetrics & Gynecology

## 2021-02-01 ENCOUNTER — Other Ambulatory Visit: Payer: Self-pay

## 2021-02-01 ENCOUNTER — Other Ambulatory Visit: Payer: Self-pay | Admitting: Obstetrics

## 2021-02-01 ENCOUNTER — Encounter: Payer: Self-pay | Admitting: *Deleted

## 2021-02-01 ENCOUNTER — Ambulatory Visit: Payer: Medicaid Other | Attending: Maternal & Fetal Medicine | Admitting: Maternal & Fetal Medicine

## 2021-02-01 ENCOUNTER — Ambulatory Visit: Payer: Medicaid Other | Attending: Obstetrics

## 2021-02-01 ENCOUNTER — Ambulatory Visit: Payer: Medicaid Other | Admitting: *Deleted

## 2021-02-01 VITALS — BP 121/64 | HR 98

## 2021-02-01 VITALS — BP 102/64 | HR 82 | Wt 188.0 lb

## 2021-02-01 DIAGNOSIS — O365930 Maternal care for other known or suspected poor fetal growth, third trimester, not applicable or unspecified: Secondary | ICD-10-CM | POA: Insufficient documentation

## 2021-02-01 DIAGNOSIS — O365990 Maternal care for other known or suspected poor fetal growth, unspecified trimester, not applicable or unspecified: Secondary | ICD-10-CM

## 2021-02-01 DIAGNOSIS — O0990 Supervision of high risk pregnancy, unspecified, unspecified trimester: Secondary | ICD-10-CM

## 2021-02-01 DIAGNOSIS — Z3A35 35 weeks gestation of pregnancy: Secondary | ICD-10-CM

## 2021-02-01 DIAGNOSIS — Z8669 Personal history of other diseases of the nervous system and sense organs: Secondary | ICD-10-CM

## 2021-02-01 NOTE — Procedures (Signed)
Frances [name] 1996/05/24 [redacted]w[redacted]d  Fetus A Non-Stress Test Interpretation for 02/01/21  Indication: IUGR  Fetal Heart Rate A Mode: External Baseline Rate (A): 155 bpm Variability: Moderate Accelerations: 15 x 15 Decelerations: None Multiple birth?: No  Uterine Activity Mode: Palpation, Toco Contraction Frequency (min): Occas UI Contraction Quality: Mild Resting Time: Adequate  Interpretation (Fetal Testing) Nonstress Test Interpretation: Reactive Overall Impression: Reassuring for gestational age Comments: Dr. Grace Bushy reviewed tracing.

## 2021-02-01 NOTE — Progress Notes (Signed)
MFM Brief Note  Frances [name] is a 25 yo G3P2 who is here for antenatal testing performed due to prior FGR that resolved over the last two exams.  Prior exam demonstrated elevated UA Doppler however, today the UA Dopplers are normal without evidence of AEDF or REDF  Biophysical profile is 10/10.   I discussed today's findings with [name].  I explained that the UA Dopplers are normal today however, given that they were elevated in the prior exam consideration for delivery should still be between 37-38 weeks. However, if the last UA Dopplers are elevated > 95th% delivery is recommended at 37 weeks.  I discussed daily kick counts and counseled [name] regarding when to call her providers or arrive to MAU. She reports good fetal movement.   All questions were answered.  I spent 15 minutes with > 50% in face to face consultation.  [name], MD

## 2021-02-01 NOTE — Progress Notes (Signed)
   PRENATAL VISIT NOTE  Subjective:  Frances [name] is a 25 y.o. G3P2002 at [redacted]w[redacted]d being seen today for ongoing prenatal care.  She is currently monitored for the following issues for this high-risk pregnancy and has History of recurrent UTIs; History of asthma; History of seizure disorder; History of kidney disease as a child; Breast nodule; Anemia; Lesion of skin of scalp; Supervision of high risk pregnancy, antepartum; COVID-19; and Maternal care for poor fetal growth on their problem list.  Patient reports no complaints.  Contractions: Not present. Vag. Bleeding: None.  Movement: Present. Denies leaking of fluid.   The following portions of the patient's history were reviewed and updated as appropriate: allergies, current medications, past family history, past medical history, past social history, past surgical history and problem list.   Objective:   Vitals:   02/01/21 1351  BP: 102/64  Pulse: 82  Weight: 188 lb (85.3 kg)    Fetal Status: Fetal Heart Rate (bpm): 161   Movement: Present     General:  Alert, oriented and cooperative. Patient is in no acute distress.  Skin: Skin is warm and dry. No rash noted.   Cardiovascular: Normal heart rate noted  Respiratory: Normal respiratory effort, no problems with respiration noted  Abdomen: Soft, gravid, appropriate for gestational age.  Pain/Pressure: Absent     Pelvic: Cervical exam deferred        Extremities: Normal range of motion.  Edema: None  Mental Status: Normal mood and affect. Normal behavior. Normal judgment and thought content.   Assessment and Plan:  Pregnancy: G3P2002 at [redacted]w[redacted]d There are no diagnoses linked to this encounter. Preterm labor symptoms and general obstetric precautions including but not limited to vaginal bleeding, contractions, leaking of fluid and fetal movement were reviewed in detail with the patient. Please refer to After Visit Summary for other counseling recommendations.  Supervision of high risk  pregnancy, antepartum  History of seizure disorder  Maternal care for poor fetal growth in third trimester, single or unspecified fetus Adequate growth last [place] and normal cord dopplers Return in about 1 week (around 02/08/2021).  Future Appointments  Date Time Provider Department Center  02/01/2021  3:15 PM Adam Phenix, MD [facility]  02/08/2021 11:00 AM [facility] NURSE [facility]  02/08/2021 11:15 AM [facility] US2 [facility]  02/08/2021 11:30 AM [facility] NST [facility]  02/15/2021 11:00 AM [facility] NURSE [facility]  02/15/2021 11:15 AM [facility] US2 [facility]  02/15/2021 11:30 AM [facility]ST [facility]    Scheryl Darter, MD

## 2021-02-08 ENCOUNTER — Ambulatory Visit: Payer: Medicaid Other | Admitting: *Deleted

## 2021-02-08 ENCOUNTER — Other Ambulatory Visit (HOSPITAL_COMMUNITY)
Admission: RE | Admit: 2021-02-08 | Discharge: 2021-02-08 | Disposition: A | Discharge status: Home / Self Care | Payer: Medicaid Other | Source: Ambulatory Visit | Attending: Obstetrics and Gynecology | Admitting: Obstetrics and Gynecology

## 2021-02-08 ENCOUNTER — Encounter: Payer: Self-pay | Admitting: *Deleted

## 2021-02-08 ENCOUNTER — Ambulatory Visit (INDEPENDENT_AMBULATORY_CARE_PROVIDER_SITE_OTHER): Payer: Medicaid Other | Admitting: Obstetrics and Gynecology

## 2021-02-08 ENCOUNTER — Ambulatory Visit: Payer: Medicaid Other | Attending: Maternal & Fetal Medicine

## 2021-02-08 ENCOUNTER — Other Ambulatory Visit: Payer: Self-pay

## 2021-02-08 VITALS — BP 120/60 | HR 96 | Wt 189.0 lb

## 2021-02-08 VITALS — BP 123/67 | HR 90

## 2021-02-08 DIAGNOSIS — O0990 Supervision of high risk pregnancy, unspecified, unspecified trimester: Secondary | ICD-10-CM | POA: Insufficient documentation

## 2021-02-08 DIAGNOSIS — O365930 Maternal care for other known or suspected poor fetal growth, third trimester, not applicable or unspecified: Secondary | ICD-10-CM | POA: Diagnosis not present

## 2021-02-08 DIAGNOSIS — Z3A36 36 weeks gestation of pregnancy: Secondary | ICD-10-CM

## 2021-02-08 DIAGNOSIS — Z362 Encounter for other antenatal screening follow-up: Secondary | ICD-10-CM

## 2021-02-08 DIAGNOSIS — O0993 Supervision of high risk pregnancy, unspecified, third trimester: Secondary | ICD-10-CM

## 2021-02-08 NOTE — Progress Notes (Signed)
   PRENATAL VISIT NOTE  Subjective:  Frances [name] is a 25 y.o. G3P2002 at [redacted]w[redacted]d being seen today for ongoing prenatal care.  She is currently monitored for the following issues for this low-risk pregnancy and has History of recurrent UTIs; History of asthma; History of seizure disorder; History of kidney disease as a child; Breast nodule; Anemia; Lesion of skin of scalp; Supervision of high risk pregnancy, antepartum; COVID-19; Maternal care for poor fetal growth; and [redacted] weeks gestation of pregnancy on their problem list.  Patient doing well with no acute concerns today. She reports no complaints.  Contractions: Not present. Vag. Bleeding: None.  Movement: Present. Denies leaking of fluid.   Discussed long term protection.  Pt does not want more children, but does not want BTL.  IUD and nexplanon discussed as best long term options  The following portions of the patient's history were reviewed and updated as appropriate: allergies, current medications, past family history, past medical history, past social history, past surgical history and problem list. Problem list updated.  Objective:   Vitals:   02/08/21 0934  BP: 120/60  Pulse: 96  Weight: 189 lb (85.7 kg)    Fetal Status: Fetal Heart Rate (bpm): 142 Fundal Height: 36 cm Movement: Present  Presentation: Vertex  General:  Alert, oriented and cooperative. Patient is in no acute distress.  Skin: Skin is warm and dry. No rash noted.   Cardiovascular: Normal heart rate noted  Respiratory: Normal respiratory effort, no problems with respiration noted  Abdomen: Soft, gravid, appropriate for gestational age.  Pain/Pressure: Present     Pelvic: Cervical exam performed Dilation: Fingertip Effacement (%): 50 Station: -3  Extremities: Normal range of motion.  Edema: None  Mental Status:  Normal mood and affect. Normal behavior. Normal judgment and thought content.   Assessment and Plan:  Pregnancy: G3P2002 at [redacted]w[redacted]d  1. [redacted] weeks  gestation of pregnancy   2. Supervision of high risk pregnancy, antepartum Continue routine care.  Pt to decide on birth control method.  - Culture, beta strep (group b only) - GC/Chlamydia probe amp ([place])not at [facility]  Term labor symptoms and general obstetric precautions including but not limited to vaginal bleeding, contractions, leaking of fluid and fetal movement were reviewed in detail with the patient.  Please refer to After Visit Summary for other counseling recommendations.   Return in about 1 week (around 02/15/2021) for LOB, in person.   Mariel Aloe, MD Faculty Attending Center for [facility]

## 2021-02-08 NOTE — Procedures (Signed)
Azaylia Fong 12-04-1995 [redacted]w[redacted]d  Fetus A Non-Stress Test Interpretation for 02/08/21  Indication: IUGR  Fetal Heart Rate A Mode: External Baseline Rate (A): 140 bpm Variability: Moderate Accelerations: 15 x 15 Decelerations: None Multiple birth?: No  Uterine Activity Mode: Palpation, Toco Contraction Frequency (min): 1 uc Contraction Duration (sec): 60 Contraction Quality: Mild Resting Tone Palpated: Relaxed Resting Time: Adequate  Interpretation (Fetal Testing) Nonstress Test Interpretation: Reactive Overall Impression: Reassuring for gestational age Comments: Dr. Judeth Cornfield reviewed tracing.

## 2021-02-09 LAB — GC/CHLAMYDIA PROBE AMP (~~LOC~~) NOT AT ARMC
Chlamydia: NEGATIVE
Comment: NEGATIVE
Comment: NORMAL
Neisseria Gonorrhea: NEGATIVE

## 2021-02-12 LAB — CULTURE, BETA STREP (GROUP B ONLY): Strep Gp B Culture: NEGATIVE

## 2021-02-15 ENCOUNTER — Other Ambulatory Visit: Payer: Self-pay

## 2021-02-15 ENCOUNTER — Ambulatory Visit: Payer: Medicaid Other | Attending: Maternal & Fetal Medicine

## 2021-02-15 ENCOUNTER — Encounter: Payer: Self-pay | Admitting: *Deleted

## 2021-02-15 ENCOUNTER — Other Ambulatory Visit: Payer: Self-pay | Admitting: Obstetrics and Gynecology

## 2021-02-15 ENCOUNTER — Ambulatory Visit: Payer: Medicaid Other | Admitting: *Deleted

## 2021-02-15 ENCOUNTER — Ambulatory Visit: Payer: Medicaid Other

## 2021-02-15 VITALS — BP 104/58 | HR 93

## 2021-02-15 DIAGNOSIS — O0990 Supervision of high risk pregnancy, unspecified, unspecified trimester: Secondary | ICD-10-CM

## 2021-02-15 DIAGNOSIS — Z3A37 37 weeks gestation of pregnancy: Secondary | ICD-10-CM

## 2021-02-15 DIAGNOSIS — Z362 Encounter for other antenatal screening follow-up: Secondary | ICD-10-CM

## 2021-02-15 DIAGNOSIS — O365930 Maternal care for other known or suspected poor fetal growth, third trimester, not applicable or unspecified: Secondary | ICD-10-CM

## 2021-02-19 ENCOUNTER — Telehealth (HOSPITAL_COMMUNITY): Payer: Self-pay | Admitting: *Deleted

## 2021-02-19 ENCOUNTER — Ambulatory Visit (INDEPENDENT_AMBULATORY_CARE_PROVIDER_SITE_OTHER): Payer: Medicaid Other | Admitting: Obstetrics and Gynecology

## 2021-02-19 ENCOUNTER — Encounter (HOSPITAL_COMMUNITY): Payer: Self-pay | Admitting: *Deleted

## 2021-02-19 ENCOUNTER — Encounter: Payer: Self-pay | Admitting: Obstetrics and Gynecology

## 2021-02-19 ENCOUNTER — Other Ambulatory Visit: Payer: Self-pay

## 2021-02-19 VITALS — BP 115/68 | HR 87 | Wt 191.9 lb

## 2021-02-19 DIAGNOSIS — O0990 Supervision of high risk pregnancy, unspecified, unspecified trimester: Secondary | ICD-10-CM

## 2021-02-19 DIAGNOSIS — O365930 Maternal care for other known or suspected poor fetal growth, third trimester, not applicable or unspecified: Secondary | ICD-10-CM

## 2021-02-19 NOTE — Telephone Encounter (Signed)
Preadmission screen  

## 2021-02-19 NOTE — Progress Notes (Signed)
Subjective:  Frances [name] is a 25 y.o. G3P2002 at [redacted]w[redacted]d being seen today for ongoing prenatal care.  She is currently monitored for the following issues for this high-risk pregnancy and has History of recurrent UTIs; History of asthma; History of seizure disorder; History of kidney disease as a child; Breast nodule; Anemia; Lesion of skin of scalp; Supervision of high risk pregnancy, antepartum; COVID-19; and Maternal care for poor fetal growth on their problem list.  Patient reports general discomforts of pregnancy.  Contractions: Not present. Vag. Bleeding: None.  Movement: Present. Denies leaking of fluid.   The following portions of the patient's history were reviewed and updated as appropriate: allergies, current medications, past family history, past medical history, past social history, past surgical history and problem list. Problem list updated.  Objective:   Vitals:   02/19/21 1419  BP: 115/68  Pulse: 87  Weight: 87 kg    Fetal Status: Fetal Heart Rate (bpm): 145   Movement: Present     General:  Alert, oriented and cooperative. Patient is in no acute distress.  Skin: Skin is warm and dry. No rash noted.   Cardiovascular: Normal heart rate noted  Respiratory: Normal respiratory effort, no problems with respiration noted  Abdomen: Soft, gravid, appropriate for gestational age. Pain/Pressure: Present     Pelvic:  Cervical exam performed        Extremities: Normal range of motion.  Edema: None  Mental Status: Normal mood and affect. Normal behavior. Normal judgment and thought content.   Urinalysis:      Assessment and Plan:  Pregnancy: G3P2002 at [redacted]w[redacted]d  1. Supervision of high risk pregnancy, antepartum Labor precautions   2. Maternal care for poor fetal growth in third trimester, single or unspecified fetus IOL scheduled for 02/23/21  Term labor symptoms and general obstetric precautions including but not limited to vaginal bleeding, contractions, leaking of fluid and  fetal movement were reviewed in detail with the patient. Please refer to After Visit Summary for other counseling recommendations.  No follow-ups on file.   Hermina Staggers, MD

## 2021-02-19 NOTE — Patient Instructions (Signed)
Vaginal Delivery ?Vaginal delivery means that you give birth by pushing your baby out of your birth canal (vagina). Your health care team will help you before, during, and after vaginal delivery. ?Birth experiences are unique for every woman and every pregnancy, and birth experiences vary depending on where you choose to give birth. ?What are the risks and benefits? ?Generally, this is safe. However, problems may occur, including: ?Bleeding. ?Infection. ?Damage to other structures such as vaginal tearing. ?Allergic reactions to medicines. ?Despite the risks, benefits of vaginal delivery include less risk of bleeding and infection and a shorter recovery time compared to a Cesarean delivery. Cesarean delivery, or C-section, is the surgical delivery of a baby. ?What happens when I arrive at the birth center or hospital? ?Once you are in labor and have been admitted into the hospital or birth center, your health care team may: ?Review your pregnancy history and any concerns that you have. ?Talk with you about your birth plan and discuss pain control options. ?Check your blood pressure, breathing, and heartbeat. ?Assess your baby's heartbeat. ?Monitor your uterus for contractions. ?Check whether your bag of water (amniotic sac) has broken (ruptured). ?Insert an IV into one of your veins. This may be used to give you fluids and medicines. ?Monitoring ?Your health care team may assess your contractions (uterine monitoring) and your baby's heart rate (fetal monitoring). You may need to be monitored: ?Often, but not continuously (intermittently). ?All the time or for long periods at a time (continuously). Continuous monitoring may be needed if: ?You are taking certain medicines, such as medicine to relieve pain or make your contractions stronger. ?You have pregnancy or labor complications. ?Monitoring may be done by: ?Placing a special stethoscope or a handheld monitoring device on your abdomen to check your baby's heartbeat  and to check for contractions. ?Placing monitors on your abdomen (external monitors) to record your baby's heartbeat and the frequency and length of contractions. ?Placing monitors inside your uterus through your vagina (internal monitors) to record your baby's heartbeat and the frequency, length, and strength of your contractions. Depending on the type of monitor, it may remain in your uterus or on your baby's head until birth. ?Telemetry. This is a type of continuous monitoring that can be done with external or internal monitors. Instead of having to stay in bed, you are able to move around. ?Physical exam ?Your health care team may perform frequent physical exams. This may include: ?Checking how and where your baby is positioned in your uterus. ?Checking your cervix to determine: ?Whether it is thinning out (effacing). ?Whether it is opening up (dilating). ?What happens during labor and delivery? ?Normal labor and delivery is divided into the following three stages: ?Stage 1 ?This is the longest stage of labor. ?Throughout this stage, you will feel contractions. Contractions generally feel mild, infrequent, and irregular at first. They get stronger, more frequent, and more regular as you move through this stage. You may have contractions about every 2-3 minutes. ?This stage ends when your cervix is completely dilated to 4 inches (10 cm) and completely effaced. ?Stage 2 ?This stage starts once your cervix is completely effaced and dilated and lasts until the delivery of your baby. ?This is the stage where you will feel an urge to push your baby out of your vagina. ?You may feel stretching and burning pain, especially when the widest part of your baby's head passes through the vaginal opening (crowning). ?Once your baby is delivered, the umbilical cord will be clamped and   cut. Timing of cutting the cord will depend on your wishes, your baby's health, and your health care provider's practices. ?Your baby will be  placed on your bare chest (skin-to-skin contact) in an upright position and covered with a warm blanket. If you are choosing to breastfeed, watch your baby for feeding cues, like rooting or sucking, and help the baby to your breast for his or her first feeding. ?Stage 3 ?This stage starts immediately after the birth of your baby and ends after you deliver the placenta. ?This stage may take anywhere from 5 to 30 minutes. ?After your baby has been delivered, you will feel contractions as your body expels the placenta. These contractions also help your uterus get smaller and reduce bleeding. ?What can I expect after labor and delivery? ?After labor is over, you and your baby will be assessed closely until you are ready to go home. Your health care team will teach you how to care for yourself and your baby. ?You and your baby may be encouraged to stay in the same room (rooming in) during your hospital stay. This will help promote early bonding and successful breastfeeding. ?Your uterus will be checked and massaged regularly (fundal massage). ?You may continue to receive fluids and medicines through an IV. ?You will have some soreness and pain in your abdomen, vagina, and the area of skin between your vaginal opening and your anus (perineum). ?If an incision was made near your vagina (episiotomy) or if you had some vaginal tearing during delivery, cold compresses may be placed on your episiotomy or your tear. This helps to reduce pain and swelling. ?It is normal to have vaginal bleeding after delivery. Wear a sanitary pad for vaginal bleeding and discharge. ?Summary ?Vaginal delivery means that you will give birth by pushing your baby out of your birth canal (vagina). ?Your health care team will monitor you and your baby throughout the stages of labor. ?After you deliver your baby, your health care team will continue to assess you and your baby to ensure you are both recovering as expected after delivery. ?This  information is not intended to replace advice given to you by your health care provider. Make sure you discuss any questions you have with your health care provider. ?Document Revised: 06/26/2020 Document Reviewed: 06/26/2020 ?Elsevier Patient Education ? 2022 [employer] ? ?

## 2021-02-21 ENCOUNTER — Other Ambulatory Visit (HOSPITAL_COMMUNITY)
Admission: RE | Admit: 2021-02-21 | Discharge: 2021-02-21 | Disposition: A | Discharge status: Home / Self Care | Payer: Medicaid Other | Source: Ambulatory Visit | Attending: Obstetrics and Gynecology | Admitting: Obstetrics and Gynecology

## 2021-02-21 ENCOUNTER — Other Ambulatory Visit: Payer: Self-pay | Admitting: Advanced Practice Midwife

## 2021-02-21 DIAGNOSIS — Z20822 Contact with and (suspected) exposure to covid-19: Secondary | ICD-10-CM | POA: Diagnosis not present

## 2021-02-21 DIAGNOSIS — Z01812 Encounter for preprocedural laboratory examination: Secondary | ICD-10-CM | POA: Diagnosis present

## 2021-02-21 LAB — SARS CORONAVIRUS 2 (TAT 6-24 HRS): SARS Coronavirus 2: NEGATIVE

## 2021-02-23 ENCOUNTER — Inpatient Hospital Stay (HOSPITAL_COMMUNITY)
Admission: AD | Admit: 2021-02-23 | Discharge: 2021-02-25 | DRG: 807 | Disposition: A | Discharge status: Home / Self Care | Payer: Medicaid Other | Attending: Obstetrics and Gynecology | Admitting: Obstetrics and Gynecology

## 2021-02-23 ENCOUNTER — Other Ambulatory Visit: Payer: Self-pay

## 2021-02-23 ENCOUNTER — Encounter (HOSPITAL_COMMUNITY): Payer: Self-pay | Admitting: Obstetrics and Gynecology

## 2021-02-23 ENCOUNTER — Inpatient Hospital Stay (HOSPITAL_COMMUNITY): Payer: Medicaid Other | Admitting: Anesthesiology

## 2021-02-23 ENCOUNTER — Encounter (HOSPITAL_COMMUNITY): Payer: Medicaid Other

## 2021-02-23 ENCOUNTER — Inpatient Hospital Stay (HOSPITAL_COMMUNITY): Payer: Medicaid Other

## 2021-02-23 DIAGNOSIS — O365930 Maternal care for other known or suspected poor fetal growth, third trimester, not applicable or unspecified: Principal | ICD-10-CM | POA: Diagnosis present

## 2021-02-23 DIAGNOSIS — Z3A39 39 weeks gestation of pregnancy: Secondary | ICD-10-CM

## 2021-02-23 DIAGNOSIS — O9902 Anemia complicating childbirth: Secondary | ICD-10-CM | POA: Diagnosis present

## 2021-02-23 DIAGNOSIS — Z8616 Personal history of COVID-19: Secondary | ICD-10-CM | POA: Diagnosis not present

## 2021-02-23 DIAGNOSIS — Z8669 Personal history of other diseases of the nervous system and sense organs: Secondary | ICD-10-CM

## 2021-02-23 DIAGNOSIS — Z8709 Personal history of other diseases of the respiratory system: Secondary | ICD-10-CM

## 2021-02-23 DIAGNOSIS — IMO0002 Reserved for concepts with insufficient information to code with codable children: Secondary | ICD-10-CM | POA: Diagnosis present

## 2021-02-23 DIAGNOSIS — U071 COVID-19: Secondary | ICD-10-CM | POA: Diagnosis present

## 2021-02-23 DIAGNOSIS — O0990 Supervision of high risk pregnancy, unspecified, unspecified trimester: Secondary | ICD-10-CM

## 2021-02-23 LAB — CBC
HCT: 33.8 % — ABNORMAL LOW (ref 36.0–46.0)
Hemoglobin: 11.3 g/dL — ABNORMAL LOW (ref 12.0–15.0)
MCH: 30.8 pg (ref 26.0–34.0)
MCHC: 33.4 g/dL (ref 30.0–36.0)
MCV: 92.1 fL (ref 80.0–100.0)
Platelets: 259 10*3/uL (ref 150–400)
RBC: 3.67 MIL/uL — ABNORMAL LOW (ref 3.87–5.11)
RDW: 13.1 % (ref 11.5–15.5)
WBC: 5.4 10*3/uL (ref 4.0–10.5)
nRBC: 0 % (ref 0.0–0.2)

## 2021-02-23 LAB — TYPE AND SCREEN
ABO/RH(D): A POS
Antibody Screen: NEGATIVE

## 2021-02-23 MED ORDER — TERBUTALINE SULFATE 1 MG/ML IJ SOLN: 0.2500 mg | Freq: Once | INTRAMUSCULAR | Status: DC | PRN

## 2021-02-23 MED ORDER — EPHEDRINE 5 MG/ML INJ: 10.0000 mg | INTRAVENOUS | Status: DC | PRN

## 2021-02-23 MED ORDER — ACETAMINOPHEN 325 MG PO TABS: 650.0000 mg | ORAL_TABLET | ORAL | Status: DC | PRN

## 2021-02-23 MED ORDER — FENTANYL CITRATE (PF) 100 MCG/2ML IJ SOLN
100.0000 ug | INTRAMUSCULAR | Status: DC | PRN
  Administered 2021-02-23: 100 ug via INTRAVENOUS
  Filled 2021-02-23: qty 2

## 2021-02-23 MED ORDER — DIPHENHYDRAMINE HCL 50 MG/ML IJ SOLN: 12.5000 mg | INTRAMUSCULAR | Status: DC | PRN

## 2021-02-23 MED ORDER — PHENYLEPHRINE 40 MCG/ML (10ML) SYRINGE FOR IV PUSH (FOR BLOOD PRESSURE SUPPORT): 80.0000 ug | PREFILLED_SYRINGE | INTRAVENOUS | Status: DC | PRN

## 2021-02-23 MED ORDER — ONDANSETRON HCL 4 MG/2ML IJ SOLN: 4.0000 mg | Freq: Four times a day (QID) | INTRAMUSCULAR | Status: DC | PRN

## 2021-02-23 MED ORDER — OXYTOCIN BOLUS FROM INFUSION
333.0000 mL | Freq: Once | INTRAVENOUS | Status: AC
  Administered 2021-02-23: 333 mL via INTRAVENOUS

## 2021-02-23 MED ORDER — MISOPROSTOL 25 MCG QUARTER TABLET: 25.0000 ug | ORAL_TABLET | ORAL | Status: DC | PRN

## 2021-02-23 MED ORDER — OXYCODONE-ACETAMINOPHEN 5-325 MG PO TABS: 2.0000 | ORAL_TABLET | ORAL | Status: DC | PRN

## 2021-02-23 MED ORDER — LIDOCAINE HCL (PF) 1 % IJ SOLN
INTRAMUSCULAR | Status: DC | PRN
  Administered 2021-02-23: 10 mL via EPIDURAL

## 2021-02-23 MED ORDER — SOD CITRATE-CITRIC ACID 500-334 MG/5ML PO SOLN: 30.0000 mL | ORAL | Status: DC | PRN

## 2021-02-23 MED ORDER — OXYCODONE-ACETAMINOPHEN 5-325 MG PO TABS: 1.0000 | ORAL_TABLET | ORAL | Status: DC | PRN

## 2021-02-23 MED ORDER — OXYTOCIN-SODIUM CHLORIDE 30-0.9 UT/500ML-% IV SOLN
1.0000 m[IU]/min | INTRAVENOUS | Status: DC
  Administered 2021-02-23: 2 m[IU]/min via INTRAVENOUS

## 2021-02-23 MED ORDER — OXYTOCIN-SODIUM CHLORIDE 30-0.9 UT/500ML-% IV SOLN
2.5000 [IU]/h | INTRAVENOUS | Status: DC
  Filled 2021-02-23: qty 500

## 2021-02-23 MED ORDER — FENTANYL-BUPIVACAINE-NACL 0.5-0.125-0.9 MG/250ML-% EP SOLN
12.0000 mL/h | EPIDURAL | Status: DC | PRN
  Administered 2021-02-23: 12 mL/h via EPIDURAL
  Filled 2021-02-23: qty 250

## 2021-02-23 MED ORDER — OXYTOCIN-SODIUM CHLORIDE 30-0.9 UT/500ML-% IV SOLN: 1.0000 m[IU]/min | INTRAVENOUS | Status: DC

## 2021-02-23 MED ORDER — LIDOCAINE HCL (PF) 1 % IJ SOLN: 30.0000 mL | INTRAMUSCULAR | Status: DC | PRN

## 2021-02-23 MED ORDER — LACTATED RINGERS IV SOLN: 500.0000 mL | INTRAVENOUS | Status: DC | PRN

## 2021-02-23 MED ORDER — LACTATED RINGERS IV SOLN
500.0000 mL | Freq: Once | INTRAVENOUS | Status: AC
  Administered 2021-02-23: 500 mL via INTRAVENOUS

## 2021-02-23 MED ORDER — LACTATED RINGERS IV SOLN: INTRAVENOUS | Status: DC

## 2021-02-23 NOTE — Anesthesia Preprocedure Evaluation (Signed)
Anesthesia Evaluation  Patient identified by MRN, date of birth, ID band Patient awake    Reviewed: Allergy & Precautions, H&P , NPO status , Patient's Chart, lab work & pertinent test results  History of Anesthesia Complications Negative for: history of anesthetic complications  Airway Mallampati: II  TM Distance: >3 FB     Dental   Pulmonary neg pulmonary ROS, Patient abstained from smoking.,    Pulmonary exam normal        Cardiovascular negative cardio ROS   Rhythm:regular Rate:Normal     Neuro/Psych negative neurological ROS  negative psych ROS   GI/Hepatic negative GI ROS, Neg liver ROS,   Endo/Other  negative endocrine ROS  Renal/GU negative Renal ROS  negative genitourinary   Musculoskeletal   Abdominal   Peds Childhood history of asthma, CKD and seizure d/o- all resolved   Hematology negative hematology ROS (+)   Anesthesia Other Findings   Reproductive/Obstetrics (+) Pregnancy                             Anesthesia Physical Anesthesia Plan  ASA: 2  Anesthesia Plan: Epidural   Post-op Pain Management:    Induction:   PONV Risk Score and Plan:   Airway Management Planned:   Additional Equipment:   Intra-op Plan:   Post-operative Plan:   Informed Consent: I have reviewed the patients History and Physical, chart, labs and discussed the procedure including the risks, benefits and alternatives for the proposed anesthesia with the patient or authorized representative who has indicated his/her understanding and acceptance.       Plan Discussed with:   Anesthesia Plan Comments:         Anesthesia Quick Evaluation

## 2021-02-23 NOTE — Plan of Care (Signed)
  Problem: Education: Goal: Knowledge of Childbirth will improve Outcome: Adequate for Discharge Goal: Ability to make informed decisions regarding treatment and plan of care will improve Outcome: Adequate for Discharge Goal: Ability to state and carry out methods to decrease the pain will improve Outcome: Adequate for Discharge Goal: Individualized Educational Video(s) Outcome: Not Applicable   Problem: Coping: Goal: Ability to verbalize concerns and feelings about labor and delivery will improve Outcome: Adequate for Discharge   Problem: Life Cycle: Goal: Ability to make normal progression through stages of labor will improve Outcome: Adequate for Discharge Goal: Ability to effectively push during vaginal delivery will improve Outcome: Completed/Met   Problem: Role Relationship: Goal: Will demonstrate positive interactions with the child Outcome: Adequate for Discharge   Problem: Safety: Goal: Risk of complications during labor and delivery will decrease Outcome: Adequate for Discharge   Problem: Pain Management: Goal: Relief or control of pain from uterine contractions will improve Outcome: Adequate for Discharge

## 2021-02-23 NOTE — H&P (Addendum)
OBSTETRIC ADMISSION HISTORY AND PHYSICAL  Frances [name] is a 25 y.o. female G3P2002 with IUP at [redacted]w[redacted]d by LMP presenting for IOL secondary to IUGR (now resolved). She reports +FMs, No LOF, no VB, no blurry vision, headaches or peripheral edema, and RUQ pain.  She plans on formula feeding. She is undecided for birth control.  She received her prenatal care at  [facility]    Dating: By LMP --->  Estimated Date of Delivery: 03/02/21  Sono:  @[redacted]w[redacted]d , CWD, normal anatomy, cephalic presentation,  2693g, 21% EFW  Prenatal History/Complications:  --recurrent UTIs (childhood, been years since last infection) --asthma (childhood, no meds) --seizure disorder (childhood, no medications) --anemia: Fe during pregnancy --IUGR (diagnosed at [redacted]w[redacted]d by EFW 6%; now resolved)  Past Medical History: Past Medical History:  Diagnosis Date   Anemia    on Iron supplements   Asthma 1999   as a child; no current problems   Chronic kidney disease    as a child   Constipation    Gall stones    Gallstones 10/23/2016   10/2016: Seen by [facility], pt states, and plans for PP follow up. Advised her on low fat diet   Seizures (HCC)    seizure disorder as a child, treated with medication; none since   UTI (lower urinary tract infection)     Past Surgical History: Past Surgical History:  Procedure Laterality Date   BACK SURGERY     IRRIGATION AND DEBRIDEMENT ABSCESS Left 06/11/2017   Procedure: IRRIGATION, DRAINAGE AND DEBRIDEMENT LEFT FLANK NECROTIZING SOFT TISSUE INFECTION;  Surgeon: 06/13/2017, MD;  Location: [facility];  Service: General;  Laterality: Left;   TYMPANOSTOMY TUBE PLACEMENT     WOUND DEBRIDEMENT Left 06/12/2017   Procedure: DRESSING CHANGE AND DEBRIDEMENT OF WOUND;  Surgeon: 13/08/2016, MD;  Location: [facility];  Service: General;  Laterality: Left;    Obstetrical History: OB History     Gravida  3   Para  2   Term  2   Preterm  0   AB  0   Living  2      SAB  0   IAB  0   Ectopic   0   Multiple  0   Live Births  2          G1: IOL for PD, VD G2: VD at home, accidental     Social History Social History   Socioeconomic History   Marital status: Single    Spouse name: Not on file   Number of children: 1   Years of education: 12   Highest education level: High school graduate  Occupational History   Not on file  Tobacco Use   Smoking status: Never   Smokeless tobacco: Never  Vaping Use   Vaping Use: Never used  Substance and Sexual Activity   Alcohol use: No   Drug use: Not Currently    Frequency: 2.0 times per week    Types: Marijuana    Comment: last used 05/2018   Sexual activity: Yes    Birth control/protection: None  Other Topics Concern   Not on file  Social History Narrative   Not on file   Social Determinants of Health   Financial Resource Strain: Not on file  Food Insecurity: No Food Insecurity   Worried About 06/2018 of Food in the Last Year: Never true   Ran Out of Food in the Last Year: Never true  Transportation Needs: No Transportation Needs  Lack of Transportation (Medical): No   Lack of Transportation (Non-Medical): No  Physical Activity: Not on file  Stress: Not on file  Social Connections: Not on file    Family History: Family History  Problem Relation Age of Onset   Asthma Mother    Hypertension Mother    Asthma Sister    Birth defects Sister        Down's syndrome   Intellectual disability Father     Allergies: Allergies  Allergen Reactions   Vancomycin Other (See Comments)    Nephrotoxicity    Medications Prior to Admission  Medication Sig Dispense Refill Last Dose   Blood Pressure Monitoring DEVI 1 each by Does not apply route once a week. (Patient not taking: No sig reported) 1 each 0    cyclobenzaprine (FLEXERIL) 10 MG tablet Take 1 tablet (10 mg total) by mouth 2 (two) times daily as needed for muscle spasms. 20 tablet 0    ferrous sulfate 325 (65 FE) MG tablet Take 1 tablet (325 mg  total) by mouth every other day. 30 tablet 3    metoCLOPramide (REGLAN) 10 MG tablet Take 1 tablet (10 mg total) by mouth 4 (four) times daily as needed for nausea or vomiting. 30 tablet 2    ondansetron (ZOFRAN ODT) 4 MG disintegrating tablet Take 1 tablet (4 mg total) by mouth every 8 (eight) hours as needed for nausea or vomiting. 20 tablet 0    Prenatal Vit-Fe Fumarate-FA (PRENATAL MULTIVITAMIN) TABS tablet Take 1 tablet by mouth daily at 12 noon.         PE Blood pressure (!) 94/52, pulse 83, temperature 98.2 F (36.8 C), temperature source Oral, resp. rate 16, height 5\' 7"  (1.702 m), weight 86.6 kg, last menstrual period 05/26/2020, not currently breastfeeding. General appearance: alert, cooperative, and appears stated age HEENT: [place]/AT, MMM Lungs: CTAB Heart: RRR Abdomen: soft, non-tender, gravid  Extremities: no sign of DVT  Pelvic: Dilation: 2.5 Effacement (%): 60 Station: -2 Exam by:: [email or web address] Presentation: cephalic  Fetal monitoringBaseline: 125 bpm, Variability: Good {> 6 bpm), Accelerations: Reactive, and Decelerations: Absent Uterine activityFrequency: Every 2-5 minutes  Prenatal labs: ABO, Rh: --/--/A POS (07/15 1441) Antibody: NEG (07/15 1441) Rubella: 3.28 (12/29 1112) RPR: Non Reactive (04/27 0814)  HBsAg: Negative (12/29 1112)  HIV: Non Reactive (04/27 0814)  GBS: Negative/-- (06/30 1022)  2 hr Glucola wnl Genetic screening  wnl Anatomy 05-16-1982 wnl except for IUGR (now resolved)  Prenatal Transfer Tool  Maternal Diabetes: No Genetic Screening: Normal Maternal Ultrasounds/Referrals: IUGR (now resolved) Fetal Ultrasounds or other Referrals:  Referred to Materal Fetal Medicine  Maternal Substance Abuse:  No Significant Maternal Medications:  None Significant Maternal Lab Results: Group B Strep negative  Results for orders placed or performed during the hospital encounter of 02/23/21 (from the past 24 hour(s))  CBC   Collection Time: 02/23/21  2:41 PM   Result Value Ref Range   WBC 5.4 4.0 - 10.5 K/uL   RBC 3.67 (L) 3.87 - 5.11 MIL/uL   Hemoglobin 11.3 (L) 12.0 - 15.0 g/dL   HCT 02/25/21 (L) 68.3 - 41.9 %   MCV 92.1 80.0 - 100.0 fL   MCH 30.8 26.0 - 34.0 pg   MCHC 33.4 30.0 - 36.0 g/dL   RDW 62.2 29.7 - 98.9 %   Platelets 259 150 - 400 K/uL   nRBC 0.0 0.0 - 0.2 %  Type and screen   Collection Time: 02/23/21  2:41 PM  Result Value Ref  Range   ABO/RH(D) A POS    Antibody Screen NEG    Sample Expiration      02/26/2021,2359 Performed at [facility], [address] [place], [place] [address]     Patient Active Problem List   Diagnosis Date Noted   Poor fetal growth 02/23/2021   Maternal care for poor fetal growth 01/17/2021   COVID-19 09/06/2020   Supervision of high risk pregnancy, antepartum 08/08/2020   Lesion of skin of scalp 01/03/2020   Anemia 10/31/2019   Breast nodule 06/07/2019   History of asthma 08/18/2018   History of seizure disorder 08/18/2018   History of kidney disease as a child 08/18/2018   History of recurrent UTIs 09/16/2016    Assessment/Plan:  Helma Argyle is a 25 y.o. G3P2002 at [redacted]w[redacted]d here for IOL secondary to IUGR, now resolved.  #Labor: Given initial cervical exam with start pitocin and up-titrate as clinically indicated. #Pain: Open to NO, IV, epidural  #FWB: Category 1 strip #GBS:  negative #MOF: bottle #MOC: undecided, discussed all options #Circ: desired prior to discharge  Sheila Oats, MD OB Fellow, Faculty Practice 02/23/2021 4:42 PM

## 2021-02-23 NOTE — Discharge Summary (Addendum)
Postpartum Discharge Summary     Patient Name: Frances [name] DOB: 07/25/1996 MRN: [id number]  Date of admission: 02/23/2021 Delivery date:02/23/2021  Delivering provider: Seabron Spates  Date of discharge: 02/25/2021  Admitting diagnosis: Poor fetal growth [P05.9] Intrauterine pregnancy: [redacted]w[redacted]d    Secondary diagnosis:  Active Problems:   History of asthma   History of seizure disorder   Supervision of high risk pregnancy, antepartum   COVID-19   Poor fetal growth   Vaginal delivery  Additional problems: none    Discharge diagnosis: Term Pregnancy Delivered                                              Post partum procedures: none Augmentation: Pitocin Complications: None  Hospital course: Induction of Labor With Vaginal Delivery   25y.o. yo G3P2002 at 358w0das admitted to the hospital 02/23/2021 for induction of labor.  Indication for induction:  resolved IUGR .  Patient had an uncomplicated labor course as follows: Membrane Rupture Time/Date: 10:23 PM ,02/23/2021   Delivery Method:Vaginal, Spontaneous  Episiotomy: None  Lacerations:  Periurethral  Details of delivery can be found in separate delivery note.  Patient had a routine postpartum course. She expressed a desire to have her son circumcised and was consented- not placed in infant's chart. Patient is discharged home 02/25/21.  Newborn Data: Birth date:02/23/2021  Birth time:10:23 PM  Gender:Female  Living status:Living  Apgars:9 ,9  Weight:2696 g (5lb 15.1oz)  Magnesium Sulfate received: No BMZ received: No Rhophylac:N/A MMR:N/A T-DaP:Given prenatally Flu: Yes Transfusion:No  Physical exam  Vitals:   02/24/21 0850 02/24/21 1329 02/24/21 2346 02/25/21 0556  BP: 107/69 126/74 105/62 116/70  Pulse: 74 72 73 76  Resp:  _0 Temp: 97.8 F (36.6 C) 97.8 F (36.6 C) 97.8 F (36.6 C) 98 F (36.7 C)  TempSrc:  Axillary Oral Oral  SpO2:  100% 100% 100%  Weight:      Height:       General: alert  and cooperative Lochia: appropriate Uterine Fundus: firm Incision: N/A DVT Evaluation: No evidence of DVT seen on physical exam. Labs: Lab Results  Component Value Date   WBC 5.4 02/23/2021   HGB 11.3 (L) 02/23/2021   HCT 33.8 (L) 02/23/2021   MCV 92.1 02/23/2021   PLT 259 02/23/2021   CMP Latest Ref Rng & Units 11/08/2020  Glucose 65 - 99 mg/dL 77  BUN 6 - 20 mg/dL 9  Creatinine 0.57 - 1.00 mg/dL 0.55(L)  Sodium 134 - 144 mmol/L 137  Potassium 3.5 - 5.2 mmol/L 4.1  Chloride 96 - 106 mmol/L 102  CO2 20 - 29 mmol/L 20  Calcium 8.7 - 10.2 mg/dL 9.4  Total Protein 6.0 - 8.5 g/dL 6.3  Total Bilirubin 0.0 - 1.2 mg/dL <0.2  Alkaline Phos 44 - 121 IU/L 65  AST 0 - 40 IU/L 18  ALT 0 - 32 IU/L 14   Edinburgh Score: Edinburgh Postnatal Depression Scale Screening Tool 02/24/2021  I have been able to laugh and see the funny side of things. 0  I have looked forward with enjoyment to things. 0  I have blamed myself unnecessarily when things went wrong. 0  I have been anxious or worried for no good reason. 0  I have felt scared or panicky for no good reason. 0  Things have  been getting on top of me. 0  I have been so unhappy that I have had difficulty sleeping. 0  I have felt sad or miserable. 0  I have been so unhappy that I have been crying. 0  The thought of harming myself has occurred to me. 0  Edinburgh Postnatal Depression Scale Total 0     After visit meds:  Allergies as of 02/25/2021       Reactions   Vancomycin Other (See Comments)   Nephrotoxicity        Medication List     STOP taking these medications    Blood Pressure Monitoring Devi   cyclobenzaprine 10 MG tablet Commonly known as: FLEXERIL   ferrous sulfate 325 (65 FE) MG tablet   metoCLOPramide 10 MG tablet Commonly known as: REGLAN   ondansetron 4 MG disintegrating tablet Commonly known as: Zofran ODT       TAKE these medications    ibuprofen 600 MG tablet Commonly known as: ADVIL Take 1  tablet (600 mg total) by mouth every 6 (six) hours.   prenatal multivitamin Tabs tablet Take 1 tablet by mouth daily at 12 noon.         Discharge home in stable condition Infant Feeding: Bottle Infant Disposition:home with mother Discharge instruction: per After Visit Summary and Postpartum booklet. Activity: Advance as tolerated. Pelvic rest for 6 weeks.  Diet: routine diet Future Appointments:No future appointments. Follow up Visit: Message sent to [facility] 02/23/21 by Sylvester Harder.   Please schedule this patient for a In person postpartum visit in 6 weeks with the following provider: Any provider. Additional Postpartum F/U: none   Low risk pregnancy complicated by:  n/a Delivery mode:  Vaginal, Spontaneous  Anticipated Birth Control:   declines   02/25/2021 Myrtis Ser, CNM 8:46 AM

## 2021-02-23 NOTE — Progress Notes (Addendum)
Labor Progress Note Frances [name] is a 25 y.o. G3P2002 at [redacted]w[redacted]d presented for IOL 2/2 IUGR  S: Feeling ctx, more painful now   O:  BP 115/76   Pulse 82   Temp 98.2 F (36.8 C) (Oral)   Resp 16   Ht 5\' 7"  (1.702 m)   Wt 191 lb (86.6 kg)   LMP 05/26/2020 (Exact Date)   BMI 29.91 kg/m  EFM: baseline 140, mod variability, accels present, no decels TOCO: contractions q2-4 min  CVE: Dilation: 3 Effacement (%): 70 Cervical Position: Posterior Station: -1 Presentation: Vertex Exam by:: Dr. [email or web address]   A&P: 25 y.o. 23 [redacted]w[redacted]d IOL for IUGR (resolved). #IOL: Pitocin started at 1545 on admission, currently at 12 milli-units/min. Progressing well; may consider AROM at next check.  #IUGR: diagnosed at 27w EFW 6%, resolved at 36w, 21%  #Pain: open to all options #FWB: Cat I strip #GBS negative #MOF: bottle #MOC: undecided, discussed all options, maybe decide at pp appt #Circ: desired prior to discharge  #Anemia: Fe during pregnancy  [redacted]w[redacted]d MD PGY3 7:55 PM  Attestation of Supervision of Resident:  I confirm that I have verified the information documented in the  resident's  note and that I have also personally reperformed the history, physical exam and all medical decision making activities.  I have verified that all services and findings are accurately documented in this student's note; and I agree with management and plan as outlined in the documentation. I have also made any necessary editorial changes.  Araceli Bouche, MD [facility] 02/23/2021 7:58 PM

## 2021-02-24 LAB — RPR: RPR Ser Ql: NONREACTIVE

## 2021-02-24 MED ORDER — MEASLES, MUMPS & RUBELLA VAC IJ SOLR: 0.5000 mL | Freq: Once | INTRAMUSCULAR | Status: DC

## 2021-02-24 MED ORDER — PRENATAL MULTIVITAMIN CH
1.0000 | ORAL_TABLET | Freq: Every day | ORAL | Status: DC
  Administered 2021-02-24: 1 via ORAL
  Filled 2021-02-24 (×2): qty 1

## 2021-02-24 MED ORDER — DIBUCAINE (PERIANAL) 1 % EX OINT: 1.0000 "application " | TOPICAL_OINTMENT | CUTANEOUS | Status: DC | PRN

## 2021-02-24 MED ORDER — ONDANSETRON HCL 4 MG/2ML IJ SOLN
4.0000 mg | INTRAMUSCULAR | Status: DC | PRN
Start: 2021-02-24 — End: 2021-02-25

## 2021-02-24 MED ORDER — COCONUT OIL OIL: 1.0000 "application " | TOPICAL_OIL | Status: DC | PRN

## 2021-02-24 MED ORDER — IBUPROFEN 600 MG PO TABS
600.0000 mg | ORAL_TABLET | Freq: Four times a day (QID) | ORAL | Status: DC
  Administered 2021-02-24 – 2021-02-25 (×5): 600 mg via ORAL
  Filled 2021-02-24 (×6): qty 1

## 2021-02-24 MED ORDER — WITCH HAZEL-GLYCERIN EX PADS: 1.0000 "application " | MEDICATED_PAD | CUTANEOUS | Status: DC | PRN

## 2021-02-24 MED ORDER — TETANUS-DIPHTH-ACELL PERTUSSIS 5-2.5-18.5 LF-MCG/0.5 IM SUSY: 0.5000 mL | PREFILLED_SYRINGE | Freq: Once | INTRAMUSCULAR | Status: DC

## 2021-02-24 MED ORDER — ONDANSETRON HCL 4 MG PO TABS: 4.0000 mg | ORAL_TABLET | ORAL | Status: DC | PRN

## 2021-02-24 MED ORDER — DIPHENHYDRAMINE HCL 25 MG PO CAPS: 25.0000 mg | ORAL_CAPSULE | Freq: Four times a day (QID) | ORAL | Status: DC | PRN

## 2021-02-24 MED ORDER — SENNOSIDES-DOCUSATE SODIUM 8.6-50 MG PO TABS: 2.0000 | ORAL_TABLET | ORAL | Status: DC

## 2021-02-24 MED ORDER — ACETAMINOPHEN 325 MG PO TABS: 650.0000 mg | ORAL_TABLET | ORAL | Status: DC | PRN

## 2021-02-24 MED ORDER — SIMETHICONE 80 MG PO CHEW: 80.0000 mg | CHEWABLE_TABLET | ORAL | Status: DC | PRN

## 2021-02-24 MED ORDER — BENZOCAINE-MENTHOL 20-0.5 % EX AERO
1.0000 "application " | INHALATION_SPRAY | CUTANEOUS | Status: DC | PRN
  Administered 2021-02-24: 1 via TOPICAL
  Filled 2021-02-24: qty 56

## 2021-02-24 NOTE — Progress Notes (Signed)
Epidural catheter removed with tip intact.

## 2021-02-24 NOTE — Progress Notes (Signed)
CSW met with MOB to complete consult for history of anxiety. CSW observed MOB resting in bed with infant on chest. CSW explained role and reason for consult. MOB was pleasant, polite, and engaged with CSW. MOB denied history of anxiety, unless it was from her younger years. MOB denied any other mental health concerns or psychotropic medications. When CSW asked MOB of her emotions since delivery. MOB reported, she feels, "relieve". MOB reported, FOB and her mother are her supports. MOB denied SI, HI, and DV when CSW assessed for safety.   CSW provided education regarding the baby blues period vs. perinatal mood disorders, discussed treatment and gave resources for mental health follow up if concerns arise. CSW recommends self- evaluation during the postpartum time period using the New Mom Checklist from Postpartum Progress and encouraged MOB to contact a medical professional if symptoms are noted at any time.   MOB reported, there are no barriers to follow up infant's care. MOB reported, she has all essentials needed to care for infant. MOB reported, infant has a car seat and bassinet. MOB denied any additional barriers.     CSW provided education on sudden infant death syndrome (SIDS).  CSW identifies no further need for intervention or barriers to discharge at this time.    Mitesh Rosendahl, MSW, LCSW-A Clinical Social Worker- Weekends [phone]  

## 2021-02-24 NOTE — Anesthesia Postprocedure Evaluation (Signed)
Anesthesia Post Note  Patient: Frances [name]  Procedure(s) Performed: AN AD HOC LABOR EPIDURAL     Patient location during evaluation: Mother Baby Anesthesia Type: Epidural Level of consciousness: awake and alert Pain management: pain level controlled Vital Signs Assessment: post-procedure vital signs reviewed and stable Respiratory status: spontaneous breathing, nonlabored ventilation and respiratory function stable Cardiovascular status: stable Postop Assessment: no headache, no backache and epidural receding Anesthetic complications: no   No notable events documented.  Last Vitals:  Vitals:   02/24/21 0127 02/24/21 0600  BP: 119/70 116/75  Pulse: 81 73  Resp: 18 19  Temp: 36.7 C (!) 36.4 C  SpO2: 100% 100%    Last Pain:  Vitals:   02/24/21 0600  TempSrc: Oral  PainSc:    Pain Goal:                   Rica Records

## 2021-02-24 NOTE — Progress Notes (Signed)
Post Partum Day 1 Subjective: no complaints, up ad lib, voiding, and tolerating PO  Objective: Blood pressure 116/75, pulse 73, temperature (!) 97.5 F (36.4 C), temperature source Oral, resp. rate 19, height 5\' 7"  (1.702 m), weight 86.6 kg, last menstrual period 05/26/2020, SpO2 100 %, unknown if currently breastfeeding.  Physical Exam:  General: alert, cooperative, and no distress Lochia: appropriate Uterine Fundus: firm Incision: n/a DVT Evaluation: No evidence of DVT seen on physical exam.  Recent Labs    02/23/21 1441  HGB 11.3*  HCT 33.8*    Assessment/Plan: Plan for discharge tomorrow and Breastfeeding   LOS: 1 day   02/25/21 02/24/2021, 7:09 AM

## 2021-02-25 MED ORDER — IBUPROFEN 600 MG PO TABS: 600.0000 mg | ORAL_TABLET | Freq: Four times a day (QID) | ORAL | 0 refills | Status: DC

## 2021-02-26 ENCOUNTER — Telehealth: Payer: Self-pay

## 2021-02-26 NOTE — Telephone Encounter (Signed)
Transition Care Management Follow-up Telephone Call Date of discharge and from where: 02/25/2021-[facility]  How have you been since you were released from the hospital? Dong good  Any questions or concerns? No  Items Reviewed: Did the pt receive and understand the discharge instructions provided? Yes  Medications obtained and verified? Yes  Other? No  Any new allergies since your discharge? No  Dietary orders reviewed? N/A Do you have support at home? Yes   Home Care and Equipment/Supplies: Were home health services ordered? not applicable If so, what is the name of the agency? N/A  Has the agency set up a time to come to the patient's home? not applicable Were any new equipment or medical supplies ordered?  No What is the name of the medical supply agency? N/A Were you able to get the supplies/equipment? not applicable Do you have any questions related to the use of the equipment or supplies? No  Functional Questionnaire: (I = Independent and D = Dependent) ADLs: I  Bathing/Dressing- I  Meal Prep- I  Eating- I  Maintaining continence- I  Transferring/Ambulation- I  Managing Meds- I  Follow up appointments reviewed:  PCP Hospital f/u appt confirmed? No   Specialist Hospital f/u appt confirmed? No  Appointment has not been scheduled yet. Are transportation arrangements needed? No  If their condition worsens, is the pt aware to call PCP or go to the Emergency Dept.? Yes Was the patient provided with contact information for the PCP's office or ED? Yes Was to pt encouraged to call back with questions or concerns? Yes

## 2021-02-26 NOTE — Telephone Encounter (Signed)
Transition Care Management Unsuccessful Follow-up Telephone Call  Date of discharge and from where:  02/25/2021- [facility]   Attempts:  1st Attempt  Reason for unsuccessful TCM follow-up call:  Left voice message

## 2021-03-07 ENCOUNTER — Telehealth (HOSPITAL_COMMUNITY): Payer: Self-pay

## 2021-03-07 NOTE — Telephone Encounter (Signed)
No answer. Left message to return nurse call.  Marcelino Duster [facility] 03/07/2021,1740

## 2021-04-12 ENCOUNTER — Ambulatory Visit: Payer: Medicaid Other | Admitting: Obstetrics and Gynecology

## 2021-04-17 ENCOUNTER — Ambulatory Visit: Payer: Medicaid Other | Admitting: Family Medicine

## 2021-05-23 ENCOUNTER — Ambulatory Visit
Admission: EM | Admit: 2021-05-23 | Discharge: 2021-05-23 | Disposition: A | Discharge status: Home / Self Care | Payer: Medicaid Other | Attending: Internal Medicine | Admitting: Internal Medicine

## 2021-05-23 ENCOUNTER — Other Ambulatory Visit: Payer: Self-pay

## 2021-05-23 DIAGNOSIS — Z20822 Contact with and (suspected) exposure to covid-19: Secondary | ICD-10-CM | POA: Diagnosis not present

## 2021-05-23 DIAGNOSIS — A084 Viral intestinal infection, unspecified: Secondary | ICD-10-CM | POA: Diagnosis not present

## 2021-05-23 DIAGNOSIS — R197 Diarrhea, unspecified: Secondary | ICD-10-CM

## 2021-05-23 DIAGNOSIS — Z3202 Encounter for pregnancy test, result negative: Secondary | ICD-10-CM

## 2021-05-23 LAB — POCT URINE PREGNANCY: Preg Test, Ur: NEGATIVE

## 2021-05-23 MED ORDER — ONDANSETRON 4 MG PO TBDP: 4.0000 mg | ORAL_TABLET | Freq: Three times a day (TID) | ORAL | 0 refills | Status: DC | PRN

## 2021-05-23 NOTE — ED Provider Notes (Signed)
[name]    CSN: [id number] Arrival date & time: 05/23/21  1032      History   Chief Complaint Chief Complaint  Patient presents with   Abdominal Pain    HPI Frances [name] is a 25 y.o. female.   Patient presents with abdominal pain, nausea, vomiting, diarrhea that started yesterday.  Patient reports 1 episode of vomiting as well as 3 episodes of diarrhea.  Denies noticing any blood in stool or emesis.  Abdominal pain is described as "cramping" and is intermittent throughout abdomen.  Denies any known fevers or sick contacts.  Denies any upper respiratory symptoms.  Denies chest pain or shortness of breath.  Denies any urinary symptoms but states that her last menstrual cycle was at the beginning of September and only blood for 2 days.  Gave birth to her child in July of this year.   Abdominal Pain  Past Medical History:  Diagnosis Date   Anemia    on Iron supplements   Asthma 1999   as a child; no current problems   Chronic kidney disease    as a child   Constipation    Gall stones    Gallstones 10/23/2016   10/2016: Seen by [facility], pt states, and plans for PP follow up. Advised her on low fat diet   Seizures (HCC)    seizure disorder as a child, treated with medication; none since    Patient Active Problem List   Diagnosis Date Noted   Poor fetal growth 02/23/2021   Vaginal delivery 02/23/2021   Maternal care for poor fetal growth 01/17/2021   COVID-19 09/06/2020   Supervision of high risk pregnancy, antepartum 08/08/2020   Lesion of skin of scalp 01/03/2020   Breast nodule 06/07/2019   History of asthma 08/18/2018   History of seizure disorder 08/18/2018   History of kidney disease as a child 08/18/2018   History of recurrent UTIs 09/16/2016    Past Surgical History:  Procedure Laterality Date   BACK SURGERY     IRRIGATION AND DEBRIDEMENT ABSCESS Left 06/11/2017   Procedure: IRRIGATION, DRAINAGE AND DEBRIDEMENT LEFT FLANK NECROTIZING SOFT  TISSUE INFECTION;  Surgeon: Avel Peace, MD;  Location: [facility];  Service: General;  Laterality: Left;   TYMPANOSTOMY TUBE PLACEMENT     WOUND DEBRIDEMENT Left 06/12/2017   Procedure: DRESSING CHANGE AND DEBRIDEMENT OF WOUND;  Surgeon: Avel Peace, MD;  Location: [facility];  Service: General;  Laterality: Left;    OB History     Gravida  3   Para  3   Term  3   Preterm  0   AB  0   Living  3      SAB  0   IAB  0   Ectopic  0   Multiple  0   Live Births  3            Home Medications    Prior to Admission medications   Medication Sig Start Date End Date Taking? Authorizing Provider  ondansetron (ZOFRAN-ODT) 4 MG disintegrating tablet Take 1 tablet (4 mg total) by mouth every 8 (eight) hours as needed for nausea or vomiting. 05/23/21  Yes Lance Muss, FNP  ibuprofen (ADVIL) 600 MG tablet Take 1 tablet (600 mg total) by mouth every 6 (six) hours. 02/25/21   Arabella Merles, CNM  Prenatal Vit-Fe Fumarate-FA (PRENATAL MULTIVITAMIN) TABS tablet Take 1 tablet by mouth daily at 12 noon.    [provider]  omeprazole (PRILOSEC) 20 MG capsule Take 1 capsule (20 mg total) by mouth daily. Patient not taking: No sig reported 04/16/20 09/28/20  Belinda Fisher, PA-C    Family History Family History  Problem Relation Age of Onset   Asthma Mother    Hypertension Mother    Asthma Sister    Birth defects Sister        Down's syndrome   Intellectual disability Father     Social History Social History   Tobacco Use   Smoking status: Never   Smokeless tobacco: Never  Vaping Use   Vaping Use: Never used  Substance Use Topics   Alcohol use: No   Drug use: Not Currently    Frequency: 2.0 times per week    Types: Marijuana    Comment: last used 05/2018     Allergies   Vancomycin   Review of Systems Review of Systems Per HPI  Physical Exam Triage Vital Signs ED Triage Vitals [05/23/21 1103]  Enc Vitals Group     BP (!) 93/45     Pulse Rate 99      Resp 18     Temp 98.4 F (36.9 C)     Temp Source Oral     SpO2 98 %     Weight      Height      Head Circumference      Peak Flow      Pain Score 6     Pain Loc      Pain Edu?      Excl. in GC?    No data found.  Updated Vital Signs BP 112/80 (BP Location: Left Arm)   Pulse 99   Temp 98.4 F (36.9 C) (Oral)   Resp 18   SpO2 98%   Breastfeeding No   Visual Acuity Right Eye Distance:   Left Eye Distance:   Bilateral Distance:    Right Eye Near:   Left Eye Near:    Bilateral Near:     Physical Exam Constitutional:      General: She is not in acute distress.    Appearance: Normal appearance. She is not toxic-appearing or diaphoretic.  HENT:     Head: Normocephalic and atraumatic.     Right Ear: Tympanic membrane and ear canal normal.     Left Ear: Tympanic membrane and ear canal normal.     Nose: Nose normal.     Mouth/Throat:     Mouth: Mucous membranes are moist.     Pharynx: No posterior oropharyngeal erythema.  Eyes:     Extraocular Movements: Extraocular movements intact.     Conjunctiva/sclera: Conjunctivae normal.     Pupils: Pupils are equal, round, and reactive to light.  Cardiovascular:     Rate and Rhythm: Normal rate and regular rhythm.     Pulses: Normal pulses.     Heart sounds: Normal heart sounds.  Pulmonary:     Effort: Pulmonary effort is normal. No respiratory distress.     Breath sounds: Normal breath sounds. No stridor. No wheezing, rhonchi or rales.  Abdominal:     General: Bowel sounds are normal. There is no distension.     Palpations: Abdomen is soft.     Tenderness: There is abdominal tenderness in the right lower quadrant and left lower quadrant. Negative signs include Murphy's sign, Rovsing's sign, McBurney's sign, psoas sign and obturator sign.  Musculoskeletal:        General: Normal range of motion.  Skin:  General: Skin is warm and dry.  Neurological:     General: No focal deficit present.     Mental Status: She is  alert and oriented to person, place, and time. Mental status is at baseline.  Psychiatric:        Mood and Affect: Mood normal.        Behavior: Behavior normal.        Thought Content: Thought content normal.        Judgment: Judgment normal.     UC Treatments / Results  Labs (all labs ordered are listed, but only abnormal results are displayed) Labs Reviewed  NOVEL CORONAVIRUS, NAA  POCT URINE PREGNANCY    EKG   Radiology No results found.  Procedures Procedures (including critical care time)  Medications Ordered in UC Medications - No data to display  Initial Impression / Assessment and Plan / UC Course  I have reviewed the triage vital signs and the nursing notes.  Pertinent labs & imaging results that were available during my care of the patient were reviewed by me and considered in my medical decision making (see chart for details).     Patient is exhibiting symptoms likely from a viral etiology.  Differential diagnoses include viral gastroenteritis or COVID-19.  COVID-19 PCR is pending.  Ondansetron prescribed as needed for nausea.  No signs of dehydration on exam.  Advised patient to increase clear oral fluid intake and to eat a bland diet.  Abdominal pain is not severe and no red flags seen on exam.  Pregnancy test was negative.  Patient to follow-up with OB/GYN for further evaluation and management of irregular menstrual cycles since giving birth.  Do not think irregular vaginal bleeding and abdominal pain are related.Discussed strict return precautions. Patient verbalized understanding and is agreeable with plan.  Final Clinical Impressions(s) / UC Diagnoses   Final diagnoses:  Viral gastroenteritis  Nausea vomiting and diarrhea  Pregnancy test negative  Encounter for laboratory testing for COVID-19 virus     Discharge Instructions      You likely have a stomach virus.  You have been prescribed Zofran to take as needed for nausea.  Please increase clear  oral fluid intake to prevent dehydration.  Go to the hospital if symptoms worsen.     ED Prescriptions     Medication Sig Dispense Auth. Provider   ondansetron (ZOFRAN-ODT) 4 MG disintegrating tablet Take 1 tablet (4 mg total) by mouth every 8 (eight) hours as needed for nausea or vomiting. 20 tablet Lance Muss, FNP      PDMP not reviewed this encounter.   Lance Muss, FNP 05/23/21 1148

## 2021-05-23 NOTE — ED Triage Notes (Signed)
Pt c/o abdominal cramping with back pains since yesterday. States had nausea and vomiting yesterday, diarrhea x3 today.

## 2021-05-23 NOTE — Discharge Instructions (Addendum)
You likely have a stomach virus.  You have been prescribed Zofran to take as needed for nausea.  Please increase clear oral fluid intake to prevent dehydration.  Go to the hospital if symptoms worsen.

## 2021-05-24 LAB — SARS-COV-2, NAA 2 DAY TAT

## 2021-05-24 LAB — NOVEL CORONAVIRUS, NAA: SARS-CoV-2, NAA: NOT DETECTED

## 2021-06-15 ENCOUNTER — Telehealth: Payer: Medicaid Other | Admitting: Nurse Practitioner

## 2021-06-15 DIAGNOSIS — N3000 Acute cystitis without hematuria: Secondary | ICD-10-CM | POA: Diagnosis not present

## 2021-06-15 MED ORDER — NITROFURANTOIN MONOHYD MACRO 100 MG PO CAPS: 100.0000 mg | ORAL_CAPSULE | Freq: Two times a day (BID) | ORAL | 0 refills | Status: DC

## 2021-06-15 NOTE — Patient Instructions (Signed)

## 2021-06-15 NOTE — Progress Notes (Signed)
Virtual Visit Consent   Frances [name], you are scheduled for a virtual visit with Frances Daphine Deutscher, FNP, a [facility] provider, today.     Just as with appointments in the office, your consent must be obtained to participate.  Your consent will be active for this visit and any virtual visit you may have with one of our providers in the next 365 days.     If you have a MyChart account, a copy of this consent can be sent to you electronically.  All virtual visits are billed to your insurance company just like a traditional visit in the office.    As this is a virtual visit, video technology does not allow for your provider to perform a traditional examination.  This may limit your provider's ability to fully assess your condition.  If your provider identifies any concerns that need to be evaluated in person or the need to arrange testing (such as labs, EKG, etc.), we will make arrangements to do so.     Although advances in technology are sophisticated, we cannot ensure that it will always work on either your end or our end.  If the connection with a video visit is poor, the visit may have to be switched to a telephone visit.  With either a video or telephone visit, we are not always able to ensure that we have a secure connection.     I need to obtain your verbal consent now.   Are you willing to proceed with your visit today? YES   Frances [name] has provided verbal consent on 06/15/2021 for a virtual visit (video or telephone).   Frances Daphine Deutscher, FNP   Date: 06/15/2021 5:15 PM   Virtual Visit via Video Note   I, Frances [name], connected with Frances [name] ([id number], April 22, 1996) on 06/15/21 at  5:15 PM EDT by a video-enabled telemedicine application and verified that I am speaking with the correct person using two identifiers.  Location: Patient: Virtual Visit Location Patient: Mobile Provider: Virtual Visit Location Provider: Mobile   I discussed the limitations  of evaluation and management by telemedicine and the availability of in person appointments. The patient expressed understanding and agreed to proceed.    History of Present Illness: Frances [name] is a 25 y.o. who identifies as a female who was assigned female at birth, and is being seen today for UTI.  HPI: Patient states she has had urgency and frequency for th elast 2 days. Has worsened some. Slight suprapubic pain. Denies back pain or fever.   Problems:  Patient Active Problem List   Diagnosis Date Noted   Poor fetal growth 02/23/2021   Vaginal delivery 02/23/2021   Maternal care for poor fetal growth 01/17/2021   COVID-19 09/06/2020   Supervision of high risk pregnancy, antepartum 08/08/2020   Lesion of skin of scalp 01/03/2020   Breast nodule 06/07/2019   History of asthma 08/18/2018   History of seizure disorder 08/18/2018   History of kidney disease as a child 08/18/2018   History of recurrent UTIs 09/16/2016    Allergies:  Allergies  Allergen Reactions   Vancomycin Other (See Comments)    Nephrotoxicity   Medications:  Current Outpatient Medications:    ibuprofen (ADVIL) 600 MG tablet, Take 1 tablet (600 mg total) by mouth every 6 (six) hours., Disp: 30 tablet, Rfl: 0   ondansetron (ZOFRAN-ODT) 4 MG disintegrating tablet, Take 1 tablet (4 mg total) by mouth every 8 (eight) hours as needed for nausea or vomiting.,  Disp: 20 tablet, Rfl: 0   Prenatal Vit-Fe Fumarate-FA (PRENATAL MULTIVITAMIN) TABS tablet, Take 1 tablet by mouth daily at 12 noon., Disp: , Rfl:   Observations/Objective: Patient is well-developed, well-nourished in no acute distress.  Resting comfortably  at home.  Head is normocephalic, atraumatic.  No labored breathing.  Speech is clear and coherent with logical content.  Patient is alert and oriented at baseline.    Assessment and Plan:  Frances [name] in today with chief complaint of Urinary Tract Infection (/)   1. Acute cystitis without  hematuria Take medication as prescribe Cotton underwear Take shower not bath Cranberry juice, yogurt Force fluids AZO over the counter X2 days Meds ordered this encounter  Medications   nitrofurantoin, macrocrystal-monohydrate, (MACROBID) 100 MG capsule    Sig: Take 1 capsule (100 mg total) by mouth 2 (two) times daily. 1 po BId    Dispense:  10 capsule    Refill:  0    Order Specific Question:   Supervising Provider    Answer:   Frances [name] [[id number]]      Follow Up Instructions: I discussed the assessment and treatment plan with the patient. The patient was provided an opportunity to ask questions and all were answered. The patient agreed with the plan and demonstrated an understanding of the instructions.  A copy of instructions were sent to the patient via MyChart.  The patient was advised to call back or seek an in-person evaluation if the symptoms worsen or if the condition fails to improve as anticipated.  Time:  I spent 10 minutes with the patient via telehealth technology discussing the above problems/concerns.    Frances Hassell Done, FNP

## 2021-07-19 ENCOUNTER — Ambulatory Visit
Admission: EM | Admit: 2021-07-19 | Discharge: 2021-07-19 | Disposition: A | Discharge status: Home / Self Care | Payer: Medicaid Other | Attending: Internal Medicine | Admitting: Internal Medicine

## 2021-07-19 DIAGNOSIS — R220 Localized swelling, mass and lump, head: Secondary | ICD-10-CM | POA: Diagnosis not present

## 2021-07-19 DIAGNOSIS — L2389 Allergic contact dermatitis due to other agents: Secondary | ICD-10-CM | POA: Diagnosis not present

## 2021-07-19 MED ORDER — PREDNISONE 20 MG PO TABS: 40.0000 mg | ORAL_TABLET | Freq: Every day | ORAL | 0 refills | Status: AC

## 2021-07-19 NOTE — Discharge Instructions (Signed)
It appears that you may have be having an allergic reaction.  This is being treated with prednisone steroid.  Please go to the hospital if symptoms worsen.  Follow-up with urgent care or primary care if symptoms persist.

## 2021-07-19 NOTE — ED Provider Notes (Signed)
[name]    CSN: [id number] Arrival date & time: 07/19/21  1546      History   Chief Complaint Chief Complaint  Patient presents with   Facial Swelling    HPI Frances [name] is a 25 y.o. female.   Patient presents with facial swelling and redness that started today.  Patient reports that she does not have any pain, itchiness, difficulty breathing.  She only noticed the swelling when she looked in the mirror.  No irritation to affected area.  Patient noticed that she had some redness underneath her left eye and to the side of her right eye and associated swelling.  Denies any changes to the environment including facial washes.  Denies any pain or drainage from the eye.  Denies any pain with extraocular movements. Denies blurry vision.     Past Medical History:  Diagnosis Date   Anemia    on Iron supplements   Asthma 1999   as a child; no current problems   Chronic kidney disease    as a child   Constipation    Gall stones    Gallstones 10/23/2016   10/2016: Seen by [facility], pt states, and plans for PP follow up. Advised her on low fat diet   Seizures (HCC)    seizure disorder as a child, treated with medication; none since    Patient Active Problem List   Diagnosis Date Noted   Poor fetal growth 02/23/2021   Vaginal delivery 02/23/2021   Maternal care for poor fetal growth 01/17/2021   COVID-19 09/06/2020   Supervision of high risk pregnancy, antepartum 08/08/2020   Lesion of skin of scalp 01/03/2020   Breast nodule 06/07/2019   History of asthma 08/18/2018   History of seizure disorder 08/18/2018   History of kidney disease as a child 08/18/2018   History of recurrent UTIs 09/16/2016    Past Surgical History:  Procedure Laterality Date   BACK SURGERY     IRRIGATION AND DEBRIDEMENT ABSCESS Left 06/11/2017   Procedure: IRRIGATION, DRAINAGE AND DEBRIDEMENT LEFT FLANK NECROTIZING SOFT TISSUE INFECTION;  Surgeon: Avel Peace, MD;  Location: [facility]  ORS;  Service: General;  Laterality: Left;   TYMPANOSTOMY TUBE PLACEMENT     WOUND DEBRIDEMENT Left 06/12/2017   Procedure: DRESSING CHANGE AND DEBRIDEMENT OF WOUND;  Surgeon: Avel Peace, MD;  Location: [facility] ORS;  Service: General;  Laterality: Left;    OB History     Gravida  3   Para  3   Term  3   Preterm  0   AB  0   Living  3      SAB  0   IAB  0   Ectopic  0   Multiple  0   Live Births  3            Home Medications    Prior to Admission medications   Medication Sig Start Date End Date Taking? Authorizing Provider  predniSONE (DELTASONE) 20 MG tablet Take 2 tablets (40 mg total) by mouth daily for 5 days. 07/19/21 07/24/21 Yes Ali Mohl, Acie Fredrickson, FNP  ibuprofen (ADVIL) 600 MG tablet Take 1 tablet (600 mg total) by mouth every 6 (six) hours. 02/25/21   Arabella Merles, CNM  nitrofurantoin, macrocrystal-monohydrate, (MACROBID) 100 MG capsule Take 1 capsule (100 mg total) by mouth 2 (two) times daily. 1 po BId 06/15/21   Daphine Deutscher, Mary-Margaret, FNP  ondansetron (ZOFRAN-ODT) 4 MG disintegrating tablet Take 1 tablet (4 mg total) by  mouth every 8 (eight) hours as needed for nausea or vomiting. 05/23/21   Gustavus Bryant, FNP  Prenatal Vit-Fe Fumarate-FA (PRENATAL MULTIVITAMIN) TABS tablet Take 1 tablet by mouth daily at 12 noon.    [provider]  omeprazole (PRILOSEC) 20 MG capsule Take 1 capsule (20 mg total) by mouth daily. Patient not taking: No sig reported 04/16/20 09/28/20  Belinda Fisher, PA-C    Family History Family History  Problem Relation Age of Onset   Asthma Mother    Hypertension Mother    Asthma Sister    Birth defects Sister        Down's syndrome   Intellectual disability Father     Social History Social History   Tobacco Use   Smoking status: Never   Smokeless tobacco: Never  Vaping Use   Vaping Use: Never used  Substance Use Topics   Alcohol use: No   Drug use: Not Currently    Frequency: 2.0 times per week    Types:  Marijuana    Comment: last used 05/2018     Allergies   Vancomycin   Review of Systems Review of Systems Per HPI  Physical Exam Triage Vital Signs ED Triage Vitals [07/19/21 1739]  Enc Vitals Group     BP 129/82     Pulse Rate 70     Resp 18     Temp 98 F (36.7 C)     Temp Source Oral     SpO2 99 %     Weight      Height      Head Circumference      Peak Flow      Pain Score 0     Pain Loc      Pain Edu?      Excl. in GC?    No data found.  Updated Vital Signs BP 129/82 (BP Location: Left Arm)   Pulse 70   Temp 98 F (36.7 C) (Oral)   Resp 18   SpO2 99%   Breastfeeding No   Visual Acuity Right Eye Distance:   Left Eye Distance:   Bilateral Distance:    Right Eye Near:   Left Eye Near:    Bilateral Near:     Physical Exam Constitutional:      General: She is not in acute distress.    Appearance: Normal appearance. She is not toxic-appearing or diaphoretic.  HENT:     Head: Normocephalic and atraumatic.      Comments: Patient has mild erythema with maculopapular rash present to circled areas on diagram directly beneath left eye and to right side of forehead beside right eye.  No signs of bacterial infection.  Eyes appear normal and no swelling noted. Eyes:     General: Lids are normal. Lids are everted, no foreign bodies appreciated. Vision grossly intact. Gaze aligned appropriately.        Right eye: No foreign body, discharge or hordeolum.        Left eye: No foreign body, discharge or hordeolum.     Extraocular Movements: Extraocular movements intact.     Conjunctiva/sclera: Conjunctivae normal.     Pupils: Pupils are equal, round, and reactive to light.  Pulmonary:     Effort: Pulmonary effort is normal.  Neurological:     General: No focal deficit present.     Mental Status: She is alert and oriented to person, place, and time. Mental status is at baseline.  Psychiatric:  Mood and Affect: Mood normal.        Behavior: Behavior  normal.        Thought Content: Thought content normal.        Judgment: Judgment normal.     UC Treatments / Results  Labs (all labs ordered are listed, but only abnormal results are displayed) Labs Reviewed - No data to display  EKG   Radiology No results found.  Procedures Procedures (including critical care time)  Medications Ordered in UC Medications - No data to display  Initial Impression / Assessment and Plan / UC Course  I have reviewed the triage vital signs and the nursing notes.  Pertinent labs & imaging results that were available during my care of the patient were reviewed by me and considered in my medical decision making (see chart for details).     It appears that patient is having an allergic reaction to something but no known allergen.  Will prescribe prednisone x5 days due to facial involvement.  No signs of bacterial infection, preseptal cellulitis, orbital cellulitis.  Patient advised to follow-up if symptoms persist and to go to the hospital if they worsen.  No signs of respiratory distress on exam.  Discussed strict return precautions.  Patient verbalized understanding and was agreeable plan. Final Clinical Impressions(s) / UC Diagnoses   Final diagnoses:  Facial swelling  Allergic contact dermatitis due to other agents     Discharge Instructions      It appears that you may have be having an allergic reaction.  This is being treated with prednisone steroid.  Please go to the hospital if symptoms worsen.  Follow-up with urgent care or primary care if symptoms persist.    ED Prescriptions     Medication Sig Dispense Auth. Provider   predniSONE (DELTASONE) 20 MG tablet Take 2 tablets (40 mg total) by mouth daily for 5 days. 10 tablet Gustavus Bryant, [place]      PDMP not reviewed this encounter.   Gustavus Bryant, [place] 07/19/21 [phone]

## 2021-07-19 NOTE — ED Triage Notes (Signed)
Pt c/o redness and edema around the left eye, now spreading on the outer right eye, and general edema across face that she first noticed today. Hx of cellulitis to left abd. Denies itching, burning. Denies new facial product, detergents, moisturizer or cleanser.

## 2021-07-26 ENCOUNTER — Other Ambulatory Visit: Payer: Self-pay

## 2021-07-26 ENCOUNTER — Ambulatory Visit
Admission: EM | Admit: 2021-07-26 | Discharge: 2021-07-26 | Disposition: A | Discharge status: Home / Self Care | Payer: Medicaid Other | Attending: Physician Assistant | Admitting: Physician Assistant

## 2021-07-26 DIAGNOSIS — J069 Acute upper respiratory infection, unspecified: Secondary | ICD-10-CM | POA: Diagnosis not present

## 2021-07-26 MED ORDER — OSELTAMIVIR PHOSPHATE 75 MG PO CAPS: 75.0000 mg | ORAL_CAPSULE | Freq: Two times a day (BID) | ORAL | 0 refills | Status: DC

## 2021-07-26 NOTE — ED Triage Notes (Signed)
Pt c/o sore throat, nasal congestion with drainage, body aches, cough, chills, headache,   Denies nausea, vomiting, diarrhea, constipation.   Onset about 2 days ago.

## 2021-07-26 NOTE — ED Provider Notes (Signed)
[name]    CSN: [id number] Arrival date & time: 07/26/21  0910      History   Chief Complaint Chief Complaint  Patient presents with   Cough    HPI Frances [name] is a 25 y.o. female.   Patient here today for evaluation of sore throat, congestion, cough and body aches that started 2 days ago.  She reports that she has had some fever as well.  She denies any nausea, vomiting or diarrhea.  She has tried over-the-counter medication without significant relief.  The history is provided by the patient.   Past Medical History:  Diagnosis Date   Anemia    on Iron supplements   Asthma 1999   as a child; no current problems   Chronic kidney disease    as a child   Constipation    Gall stones    Gallstones 10/23/2016   10/2016: Seen by [facility], pt states, and plans for PP follow up. Advised her on low fat diet   Seizures (HCC)    seizure disorder as a child, treated with medication; none since    Patient Active Problem List   Diagnosis Date Noted   Poor fetal growth 02/23/2021   Vaginal delivery 02/23/2021   Maternal care for poor fetal growth 01/17/2021   COVID-19 09/06/2020   Supervision of high risk pregnancy, antepartum 08/08/2020   Lesion of skin of scalp 01/03/2020   Breast nodule 06/07/2019   History of asthma 08/18/2018   History of seizure disorder 08/18/2018   History of kidney disease as a child 08/18/2018   History of recurrent UTIs 09/16/2016    Past Surgical History:  Procedure Laterality Date   BACK SURGERY     IRRIGATION AND DEBRIDEMENT ABSCESS Left 06/11/2017   Procedure: IRRIGATION, DRAINAGE AND DEBRIDEMENT LEFT FLANK NECROTIZING SOFT TISSUE INFECTION;  Surgeon: Avel Peace, MD;  Location: [facility]S;  Service: General;  Laterality: Left;   TYMPANOSTOMY TUBE PLACEMENT     WOUND DEBRIDEMENT Left 06/12/2017   Procedure: DRESSING CHANGE AND DEBRIDEMENT OF WOUND;  Surgeon: Avel Peace, MD;  Location: [facility];  Service: General;   Laterality: Left;    OB History     Gravida  3   Para  3   Term  3   Preterm  0   AB  0   Living  3      SAB  0   IAB  0   Ectopic  0   Multiple  0   Live Births  3            Home Medications    Prior to Admission medications   Medication Sig Start Date End Date Taking? Authorizing Provider  oseltamivir (TAMIFLU) 75 MG capsule Take 1 capsule (75 mg total) by mouth every 12 (twelve) hours. 07/26/21  Yes Tomi Bamberger, PA-C  ibuprofen (ADVIL) 600 MG tablet Take 1 tablet (600 mg total) by mouth every 6 (six) hours. 02/25/21   Arabella Merles, CNM  nitrofurantoin, macrocrystal-monohydrate, (MACROBID) 100 MG capsule Take 1 capsule (100 mg total) by mouth 2 (two) times daily. 1 po BId 06/15/21   Daphine Deutscher, Mary-Margaret, FNP  ondansetron (ZOFRAN-ODT) 4 MG disintegrating tablet Take 1 tablet (4 mg total) by mouth every 8 (eight) hours as needed for nausea or vomiting. 05/23/21   Gustavus Bryant, FNP  Prenatal Vit-Fe Fumarate-FA (PRENATAL MULTIVITAMIN) TABS tablet Take 1 tablet by mouth daily at 12 noon.    [provider]  omeprazole (PRILOSEC) 20 MG capsule Take 1 capsule (20 mg total) by mouth daily. Patient not taking: No sig reported 04/16/20 09/28/20  Belinda Fisher, PA-C    Family History Family History  Problem Relation Age of Onset   Asthma Mother    Hypertension Mother    Asthma Sister    Birth defects Sister        Down's syndrome   Intellectual disability Father     Social History Social History   Tobacco Use   Smoking status: Never   Smokeless tobacco: Never  Vaping Use   Vaping Use: Never used  Substance Use Topics   Alcohol use: No   Drug use: Not Currently    Frequency: 2.0 times per week    Types: Marijuana    Comment: last used 05/2018     Allergies   Vancomycin   Review of Systems Review of Systems  Constitutional:  Positive for fever.  HENT:  Positive for congestion and sore throat. Negative for ear pain.   Eyes:   Negative for discharge and redness.  Respiratory:  Positive for cough. Negative for shortness of breath and wheezing.   Gastrointestinal:  Negative for abdominal pain, diarrhea, nausea and vomiting.    Physical Exam Triage Vital Signs ED Triage Vitals  Enc Vitals Group     BP      Pulse      Resp      Temp      Temp src      SpO2      Weight      Height      Head Circumference      Peak Flow      Pain Score      Pain Loc      Pain Edu?      Excl. in GC?    No data found.  Updated Vital Signs BP 126/83 (BP Location: Left Arm)    Pulse (!) 119    Temp 100 F (37.8 C) (Oral)    Resp 18    SpO2 96%    Breastfeeding No      Physical Exam Vitals and nursing note reviewed.  Constitutional:      General: She is not in acute distress.    Appearance: Normal appearance. She is not ill-appearing.  HENT:     Head: Normocephalic and atraumatic.     Nose: Congestion present.     Mouth/Throat:     Mouth: Mucous membranes are moist.     Pharynx: No oropharyngeal exudate or posterior oropharyngeal erythema.  Eyes:     Conjunctiva/sclera: Conjunctivae normal.  Cardiovascular:     Rate and Rhythm: Normal rate and regular rhythm.     Heart sounds: Normal heart sounds. No murmur heard. Pulmonary:     Effort: Pulmonary effort is normal. No respiratory distress.     Breath sounds: Normal breath sounds. No wheezing, rhonchi or rales.  Skin:    General: Skin is warm and dry.  Neurological:     Mental Status: She is alert.  Psychiatric:        Mood and Affect: Mood normal.        Thought Content: Thought content normal.     UC Treatments / Results  Labs (all labs ordered are listed, but only abnormal results are displayed) Labs Reviewed  COVID-19, FLU A+B NAA    EKG   Radiology No results found.  Procedures Procedures (including critical care time)  Medications Ordered in  UC Medications - No data to display  Initial Impression / Assessment and Plan / UC Course  I  have reviewed the triage vital signs and the nursing notes.  Pertinent labs & imaging results that were available during my care of the patient were reviewed by me and considered in my medical decision making (see chart for details).    Suspect likely viral etiology of symptoms.  COVID and flu screening ordered.  Discussed that symptoms are very consistent with flu and well order Tamiflu for same.  Encouraged follow-up with any further concerns.  Final Clinical Impressions(s) / UC Diagnoses   Final diagnoses:  Acute upper respiratory infection   Discharge Instructions   None    ED Prescriptions     Medication Sig Dispense Auth. Provider   oseltamivir (TAMIFLU) 75 MG capsule Take 1 capsule (75 mg total) by mouth every 12 (twelve) hours. 10 capsule Tomi Bamberger, PA-C      PDMP not reviewed this encounter.   Tomi Bamberger, PA-C 07/26/21 1028

## 2021-07-27 LAB — COVID-19, FLU A+B NAA
Influenza A, NAA: DETECTED — AB
Influenza B, NAA: NOT DETECTED
SARS-CoV-2, NAA: NOT DETECTED

## 2021-11-25 ENCOUNTER — Encounter (HOSPITAL_COMMUNITY): Payer: Self-pay | Admitting: Emergency Medicine

## 2021-11-25 ENCOUNTER — Emergency Department (HOSPITAL_COMMUNITY)
Admission: EM | Admit: 2021-11-25 | Discharge: 2021-11-25 | Disposition: A | Discharge status: Home / Self Care | Payer: Medicaid Other | Attending: Emergency Medicine | Admitting: Emergency Medicine

## 2021-11-25 DIAGNOSIS — R220 Localized swelling, mass and lump, head: Secondary | ICD-10-CM

## 2021-11-25 DIAGNOSIS — T7840XA Allergy, unspecified, initial encounter: Secondary | ICD-10-CM | POA: Diagnosis not present

## 2021-11-25 MED ORDER — PREDNISONE 20 MG PO TABS
60.0000 mg | ORAL_TABLET | Freq: Once | ORAL | Status: AC
  Administered 2021-11-25: 60 mg via ORAL
  Filled 2021-11-25: qty 3

## 2021-11-25 MED ORDER — DIPHENHYDRAMINE HCL 25 MG PO CAPS
25.0000 mg | ORAL_CAPSULE | Freq: Once | ORAL | Status: AC
  Administered 2021-11-25: 25 mg via ORAL
  Filled 2021-11-25: qty 1

## 2021-11-25 NOTE — ED Triage Notes (Signed)
Patient c/o swelling and redness to face today. Denies itching. Denies throat swelling. Speaking in full sentences without difficulty. ?

## 2021-11-25 NOTE — Discharge Instructions (Signed)
You were seen in the emergency department today for facial swelling. ? ?As we discussed I think your symptoms are like related to an allergic reaction.  We have observed you during her time in the emergency department, and I am reassured that your symptoms have not gotten any worse.  Benadryl is usually the best medicine for this. ? ?I also recommend you starting a daily allergy medicine such as Claritin or Zyrtec.  You would likely benefit from following with your primary doctor, to discuss allergy testing. ?

## 2021-11-25 NOTE — ED Provider Notes (Signed)
?[facility]-EMERGENCY DEPT ?Provider Note ? ? ?CSN: [id number] ?Arrival date & time: 11/25/21  1738 ? ?  ? ?History ? ?Chief Complaint  ?Patient presents with  ?? Facial Swelling  ? ? ?Frances [name] is a 26 y.o. female who presents the emergency department with facial swelling and redness starting today.  Patient states that she noticed the symptoms starting this afternoon.  Denies itching, but states that her face feels hot and "tight".  Denies throat swelling or difficulty swallowing.  Denies difficulty breathing.  Denies any change in lotions, soaps, detergents, or medicines. ? ?HPI ? ?  ? ?Home Medications ?Prior to Admission medications   ?Medication Sig Start Date End Date Taking? Authorizing Provider  ?ibuprofen (ADVIL) 600 MG tablet Take 1 tablet (600 mg total) by mouth every 6 (six) hours. 02/25/21   Arabella Merles, CNM  ?nitrofurantoin, macrocrystal-monohydrate, (MACROBID) 100 MG capsule Take 1 capsule (100 mg total) by mouth 2 (two) times daily. 1 po BId 06/15/21   [name], FNP  ?ondansetron (ZOFRAN-ODT) 4 MG disintegrating tablet Take 1 tablet (4 mg total) by mouth every 8 (eight) hours as needed for nausea or vomiting. 05/23/21   Gustavus Bryant, FNP  ?oseltamivir (TAMIFLU) 75 MG capsule Take 1 capsule (75 mg total) by mouth every 12 (twelve) hours. 07/26/21   Tomi Bamberger, PA-C  ?Prenatal Vit-Fe Fumarate-FA (PRENATAL MULTIVITAMIN) TABS tablet Take 1 tablet by mouth daily at 12 noon.    [provider]  ?omeprazole (PRILOSEC) 20 MG capsule Take 1 capsule (20 mg total) by mouth daily. ?Patient not taking: No sig reported 04/16/20 09/28/20  Belinda Fisher, PA-C  ?   ? ?Allergies    ?Vancomycin   ? ?Review of Systems   ?Review of Systems  ?Constitutional:  Negative for fever.  ?HENT:  Positive for facial swelling. Negative for sore throat and trouble swallowing.   ?Respiratory:  Negative for chest tightness and shortness of breath.   ?All other systems reviewed and  are negative. ? ?Physical Exam ?Updated Vital Signs ?BP 122/86 (BP Location: Right Arm)   Pulse 85   Temp 98.4 ?F (36.9 ?C) (Oral)   Resp 18   SpO2 100%  ?Physical Exam ?Vitals and nursing note reviewed.  ?Constitutional:   ?   Appearance: Normal appearance.  ?HENT:  ?   Head: Normocephalic and atraumatic.  ?   Comments: Mild swelling and blotchy erythema of the bilateral cheeks and lower eyelids ?   Mouth/Throat:  ?   Lips: Pink.  ?   Mouth: Mucous membranes are moist. No angioedema.  ?Eyes:  ?   Conjunctiva/sclera: Conjunctivae normal.  ?Cardiovascular:  ?   Rate and Rhythm: Normal rate and regular rhythm.  ?Pulmonary:  ?   Effort: Pulmonary effort is normal. No respiratory distress.  ?   Breath sounds: Normal breath sounds.  ?Abdominal:  ?   General: There is no distension.  ?   Palpations: Abdomen is soft.  ?   Tenderness: There is no abdominal tenderness.  ?Skin: ?   General: Skin is warm and dry.  ?Neurological:  ?   General: No focal deficit present.  ?   Mental Status: She is alert.  ? ? ?ED Results / Procedures / Treatments   ?Labs ?(all labs ordered are listed, but only abnormal results are displayed) ?Labs Reviewed - No data to display ? ?EKG ?None ? ?Radiology ?No results found. ? ?Procedures ?Procedures  ? ? ?Medications Ordered in ED ?Medications  ?  diphenhydrAMINE (BENADRYL) capsule 25 mg (25 mg Oral Given 11/25/21 1921)  ? ? ?ED Course/ Medical Decision Making/ A&P ?  ?                        ?Medical Decision Making ?This patient is a 26 year old female who presents to the ED for concern of facial swelling.  ? ?Differential diagnoses prior to evaluation: ?Cellulitis, allergic reaction, dental abscess, periorbital cellulitis. This is not an exhaustive differential.  ? ?Past Medical History / Co-morbidities / Social History: ?Asthma ? ?Physical Exam: ?Physical exam performed. The pertinent findings include: Mild swelling and blotchy erythema of the bilateral cheeks and lower eyelids. No angioedema.  Normal vital signs.  ? ?Medications: ?Patient given Benadryl and prednisone ?  ?Disposition: ?After consideration of the diagnostic results and the patients response to treatment, I feel that patient symptoms are likely related to an allergic reaction.  Patient given medication and observed for almost 3 hours in the emergency department, with no worsening of symptoms.  Encourage following up with her primary care doctor to discuss allergy testing, as patient is unsure what triggered this event.  Discussed reasons to return to the emergency department, and the patient is agreeable to the plan. ? ?Final Clinical Impression(s) / ED Diagnoses ?Final diagnoses:  ?Facial swelling  ?Allergic reaction, initial encounter  ? ? ?Rx / DC Orders ?ED Discharge Orders   ? ? None  ? ?  ? ?Portions of this report may have been transcribed using voice recognition software. Every effort was made to ensure accuracy; however, inadvertent computerized transcription errors may be present. ? ?  ?Verlinda Slotnick T, PA-C ?11/25/21 2100 ? ?  ?Ernie Avena, MD ?11/25/21 2311 ? ?

## 2021-11-26 ENCOUNTER — Telehealth: Payer: Self-pay

## 2021-11-26 NOTE — Telephone Encounter (Signed)
Transition Care Management Unsuccessful Follow-up Telephone Call ? ?Date of discharge and from where:  11/25/2021-[place]  ? ?Attempts:  1st Attempt ? ?Reason for unsuccessful TCM follow-up call:  Left voice message ? ?  ?

## 2021-11-27 NOTE — Telephone Encounter (Signed)
Transition Care Management Unsuccessful Follow-up Telephone Call ? ?Date of discharge and from where:  11/25/2021-[place]  ? ?Attempts:  2nd Attempt ? ?Reason for unsuccessful TCM follow-up call:  Left voice message ? ?  ?

## 2021-11-28 NOTE — Telephone Encounter (Signed)
Transition Care Management Unsuccessful Follow-up Telephone Call ? ?Date of discharge and from where:  11/25/2021-[place]  ? ?Attempts:  3rd Attempt ? ?Reason for unsuccessful TCM follow-up call:  Left voice message ? ?  ?

## 2021-12-01 ENCOUNTER — Ambulatory Visit: Payer: Medicaid Other

## 2021-12-24 IMAGING — US US MFM UA CORD DOPPLER
1 series · 1 of 1 positions shown · non-contrast
Comparison: none

[Series 1: us mfm ua cord doppler · 1 of 1 slices shown]
[im 1/1]
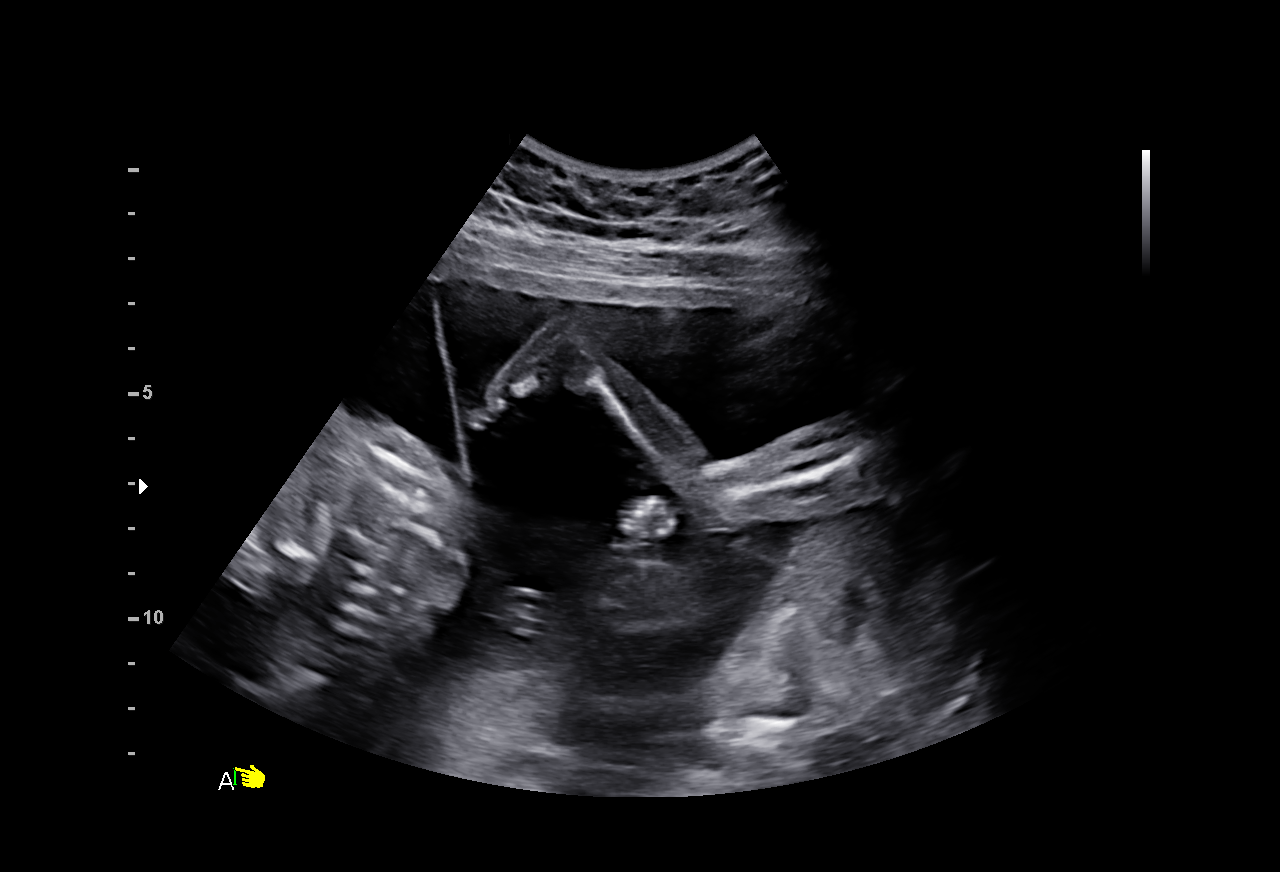

[1 of 1 positions shown; findings below may reference images not displayed]

Indications

 Maternal care for known or suspected poor
 fetal growth, third trimester, fetus 1 IUGR
 30 weeks gestation of pregnancy
 Low risk NIPS, neg Horizon 4
Fetal Evaluation

 Num Of Fetuses:         1
 Cardiac Activity:       Observed
 Presentation:           Cephalic
 Placenta:               Rt. Anterior
 P. Cord Insertion:      Previously Visualized

 Amniotic Fluid
 AFI FV:      Within normal limits

 AFI Sum(cm)     %Tile       Largest Pocket(cm)
 13.81           45

 RUQ(cm)       RLQ(cm)       LUQ(cm)        LLQ(cm)

Biometry

 BPD:      79.7  mm     G. Age:  32w 0d         74  %    CI:        75.21   %    70 - 86
                                                         FL/HC:      19.8   %    19.3 -
 HC:      291.5  mm     G. Age:  32w 1d         49  %    HC/AC:      1.19        0.96 -
 AC:      244.4  mm     G. Age:  28w 5d        3.4  %    FL/BPD:     72.5   %    71 - 87
 FL:       57.8  mm     G. Age:  30w 2d         20  %    FL/AC:      23.6   %    20 - 24

 Est. FW:    5025  gm      3 lb 3 oz     11  %
OB History

 Gravidity:    3         Term:   2        Prem:   0        SAB:   0
 TOP:          0       Ectopic:  0        Living: 2
Gestational Age

 LMP:           30w 6d        Date:  05/26/20                 EDD:   03/02/21
 U/S Today:     30w 6d                                        EDD:   03/02/21
 Best:          30w 6d     Det. By:  LMP  (05/26/20)          EDD:   03/02/21
Anatomy

 Cranium:               Appears normal         RVOT:                   Appears normal
 Cavum:                 Appears normal         LVOT:                   Appears normal
 Ventricles:            Appears normal         Aortic Arch:            Appears normal
 Choroid Plexus:        Appears normal         Stomach:                Appears normal, left
                                                                       sided
 Cerebellum:            Appears normal         Kidneys:                Appear normal
 Posterior Fossa:       Appears normal         Bladder:                Appears normal
 Heart:                 Appears normal
                        (4CH, axis, and
                        situs)
Doppler - Fetal Vessels

 Umbilical Artery
  S/D     %tile      RI    %tile      PI    %tile     PSV    ADFV    RDFV
                                                    (cm/s)
  2.75       49    0.64       54    0.[REDACTED]      No      No

Comments

 This patient was seen for a follow up growth scan due to fetal
 growth restriction noted during her prior ultrasound exams.
 She denies any problems since her last exam and reports
 feeling vigorous fetal movements throughout the day.
 On today's exam, the EFW measures at the 11th percentile
 for her gestational age indicating borderline fetal growth
 restriction.  The fetus has grown close to one pound over the
 past 3 weeks.  There was normal amniotic fluid noted.
 Doppler studies of the umbilical arteries showed a normal
 S/D ratio of 2.75.  There were no signs of absent or reversed
 end-diastolic flow.
 The patient had a reactive nonstress test following today's
 ultrasound exam.
 Due to borderline fetal growth restriction, we will continue to
 follow her with weekly fetal testing and umbilical artery
 Doppler studies.
 Another nonstress test and umbilical artery Doppler study
 was scheduled in 1 week.

## 2022-01-15 ENCOUNTER — Encounter: Payer: Self-pay | Admitting: *Deleted

## 2022-01-16 ENCOUNTER — Other Ambulatory Visit: Payer: Self-pay

## 2022-01-16 ENCOUNTER — Emergency Department (HOSPITAL_COMMUNITY)
Admission: EM | Admit: 2022-01-16 | Discharge: 2022-01-16 | Disposition: A | Discharge status: Home / Self Care | Payer: Medicaid Other | Attending: Emergency Medicine | Admitting: Emergency Medicine

## 2022-01-16 ENCOUNTER — Encounter (HOSPITAL_COMMUNITY): Payer: Self-pay

## 2022-01-16 DIAGNOSIS — N390 Urinary tract infection, site not specified: Secondary | ICD-10-CM | POA: Diagnosis not present

## 2022-01-16 DIAGNOSIS — R509 Fever, unspecified: Secondary | ICD-10-CM | POA: Diagnosis not present

## 2022-01-16 DIAGNOSIS — R1084 Generalized abdominal pain: Secondary | ICD-10-CM | POA: Insufficient documentation

## 2022-01-16 LAB — CBC
HCT: 36.4 % (ref 36.0–46.0)
Hemoglobin: 11.9 g/dL — ABNORMAL LOW (ref 12.0–15.0)
MCH: 30 pg (ref 26.0–34.0)
MCHC: 32.7 g/dL (ref 30.0–36.0)
MCV: 91.7 fL (ref 80.0–100.0)
Platelets: 253 10*3/uL (ref 150–400)
RBC: 3.97 MIL/uL (ref 3.87–5.11)
RDW: 11.9 % (ref 11.5–15.5)
WBC: 8.2 10*3/uL (ref 4.0–10.5)
nRBC: 0 % (ref 0.0–0.2)

## 2022-01-16 LAB — URINALYSIS, ROUTINE W REFLEX MICROSCOPIC
Bilirubin Urine: NEGATIVE
Glucose, UA: NEGATIVE mg/dL
Hgb urine dipstick: NEGATIVE
Ketones, ur: 5 mg/dL — AB
Nitrite: NEGATIVE
Protein, ur: NEGATIVE mg/dL
Specific Gravity, Urine: 1.024 (ref 1.005–1.030)
pH: 5 (ref 5.0–8.0)

## 2022-01-16 LAB — COMPREHENSIVE METABOLIC PANEL
ALT: 43 U/L (ref 0–44)
AST: 41 U/L (ref 15–41)
Albumin: 4.8 g/dL (ref 3.5–5.0)
Alkaline Phosphatase: 53 U/L (ref 38–126)
Anion gap: 8 (ref 5–15)
BUN: 7 mg/dL (ref 6–20)
CO2: 23 mmol/L (ref 22–32)
Calcium: 9.5 mg/dL (ref 8.9–10.3)
Chloride: 107 mmol/L (ref 98–111)
Creatinine, Ser: 0.64 mg/dL (ref 0.44–1.00)
GFR, Estimated: >60 mL/min (ref >60)
Glucose, Bld: 101 mg/dL — ABNORMAL HIGH (ref 70–99)
Potassium: 3.6 mmol/L (ref 3.5–5.1)
Sodium: 138 mmol/L (ref 135–145)
Total Bilirubin: 0.7 mg/dL (ref 0.3–1.2)
Total Protein: 8.6 g/dL — ABNORMAL HIGH (ref 6.5–8.1)

## 2022-01-16 LAB — I-STAT BETA HCG BLOOD, ED (MC, WL, AP ONLY): I-stat hCG, quantitative: <5 m[IU]/mL (ref <5)

## 2022-01-16 LAB — HCG, QUANTITATIVE, PREGNANCY: hCG, Beta Chain, Quant, S: <1 m[IU]/mL (ref <5)

## 2022-01-16 LAB — LIPASE, BLOOD: Lipase: 30 U/L (ref 11–51)

## 2022-01-16 MED ORDER — ONDANSETRON HCL 4 MG/2ML IJ SOLN
4.0000 mg | Freq: Once | INTRAMUSCULAR | Status: AC
  Administered 2022-01-16: 4 mg via INTRAVENOUS
  Filled 2022-01-16: qty 2

## 2022-01-16 MED ORDER — SODIUM CHLORIDE 0.9 % IV BOLUS
1000.0000 mL | Freq: Once | INTRAVENOUS | Status: AC
  Administered 2022-01-16: 1000 mL via INTRAVENOUS

## 2022-01-16 MED ORDER — HYDROMORPHONE HCL 1 MG/ML IJ SOLN
1.0000 mg | Freq: Once | INTRAMUSCULAR | Status: AC
  Administered 2022-01-16: 1 mg via INTRAVENOUS
  Filled 2022-01-16: qty 1

## 2022-01-16 MED ORDER — CEPHALEXIN 500 MG PO CAPS
1000.0000 mg | ORAL_CAPSULE | Freq: Once | ORAL | Status: AC
  Administered 2022-01-16: 1000 mg via ORAL
  Filled 2022-01-16: qty 2

## 2022-01-16 MED ORDER — ACETAMINOPHEN 325 MG PO TABS
650.0000 mg | ORAL_TABLET | Freq: Once | ORAL | Status: AC
  Administered 2022-01-16: 650 mg via ORAL
  Filled 2022-01-16: qty 2

## 2022-01-16 MED ORDER — CEPHALEXIN 500 MG PO CAPS: 500.0000 mg | ORAL_CAPSULE | Freq: Four times a day (QID) | ORAL | 0 refills | Status: DC

## 2022-01-16 NOTE — ED Triage Notes (Signed)
Pt reports severe abdominal pain and back pain that began last night. Pt also reports dizziness. Pt has 102 fever in triage.

## 2022-01-16 NOTE — ED Notes (Signed)
Pt complaining of pain in the abdomen. Feels thirsty, advised her that we will have to wait until some of her testing comes back.

## 2022-01-16 NOTE — ED Provider Notes (Addendum)
[facility]-EMERGENCY DEPT Provider Note   CSN: [id number] Arrival date & time: 01/16/22  1653     History  Chief Complaint  Patient presents with   Abdominal Pain   Fever    Frances [name] is a 26 y.o. female.  Patient complains of lower abdominal pain and fever which is running into her back.  No past medical history  The history is provided by the patient and medical records. No language interpreter was used.  Abdominal Pain Pain location:  Generalized Pain quality: aching   Pain radiates to:  Does not radiate Pain severity:  Moderate Onset quality:  Sudden Timing:  Constant Progression:  Worsening Chronicity:  New Context: not alcohol use   Relieved by:  Nothing Associated symptoms: fever   Associated symptoms: no chest pain, no cough, no diarrhea, no fatigue and no hematuria   Fever Associated symptoms: no chest pain, no congestion, no cough, no diarrhea, no headaches and no rash       Home Medications Prior to Admission medications   Medication Sig Start Date End Date Taking? Authorizing Provider  ibuprofen (ADVIL) 600 MG tablet Take 1 tablet (600 mg total) by mouth every 6 (six) hours. 02/25/21   Arabella Merles, CNM  nitrofurantoin, macrocrystal-monohydrate, (MACROBID) 100 MG capsule Take 1 capsule (100 mg total) by mouth 2 (two) times daily. 1 po BId 06/15/21   Daphine Deutscher, Mary-Margaret, FNP  ondansetron (ZOFRAN-ODT) 4 MG disintegrating tablet Take 1 tablet (4 mg total) by mouth every 8 (eight) hours as needed for nausea or vomiting. 05/23/21   Gustavus Bryant, FNP  oseltamivir (TAMIFLU) 75 MG capsule Take 1 capsule (75 mg total) by mouth every 12 (twelve) hours. 07/26/21   Tomi Bamberger, PA-C  Prenatal Vit-Fe Fumarate-FA (PRENATAL MULTIVITAMIN) TABS tablet Take 1 tablet by mouth daily at 12 noon.    [provider]  omeprazole (PRILOSEC) 20 MG capsule Take 1 capsule (20 mg total) by mouth daily. Patient not taking: No sig reported  04/16/20 09/28/20  Belinda Fisher, PA-C      Allergies    Vancomycin    Review of Systems   Review of Systems  Constitutional:  Positive for fever. Negative for appetite change and fatigue.  HENT:  Negative for congestion, ear discharge and sinus pressure.   Eyes:  Negative for discharge.  Respiratory:  Negative for cough.   Cardiovascular:  Negative for chest pain.  Gastrointestinal:  Positive for abdominal pain. Negative for diarrhea.  Genitourinary:  Negative for frequency and hematuria.  Musculoskeletal:  Negative for back pain.  Skin:  Negative for rash.  Neurological:  Negative for seizures and headaches.  Psychiatric/Behavioral:  Negative for hallucinations.    Physical Exam Updated Vital Signs BP 116/72   Pulse 93   Temp (!) 102 F (38.9 C) (Oral)   Resp 18   SpO2 99%  Physical Exam Vitals and nursing note reviewed.  Constitutional:      Appearance: She is well-developed.  HENT:     Head: Normocephalic.     Mouth/Throat:     Mouth: Mucous membranes are moist.  Eyes:     General: No scleral icterus.    Conjunctiva/sclera: Conjunctivae normal.  Neck:     Thyroid: No thyromegaly.  Cardiovascular:     Rate and Rhythm: Normal rate and regular rhythm.     Heart sounds: No murmur heard.   No friction rub. No gallop.  Pulmonary:     Breath sounds: No stridor. No  wheezing or rales.  Chest:     Chest wall: No tenderness.  Abdominal:     General: There is no distension.     Tenderness: There is abdominal tenderness. There is no rebound.  Genitourinary:    Comments: Pelvic exam done.  No tenderness to adnexal or cervical Musculoskeletal:        General: Normal range of motion.     Cervical back: Neck supple.  Lymphadenopathy:     Cervical: No cervical adenopathy.  Skin:    Findings: No erythema or rash.  Neurological:     Mental Status: She is alert and oriented to person, place, and time.     Motor: No abnormal muscle tone.     Coordination: Coordination normal.   Psychiatric:        Behavior: Behavior normal.    ED Results / Procedures / Treatments   Labs (all labs ordered are listed, but only abnormal results are displayed) Labs Reviewed  COMPREHENSIVE METABOLIC PANEL - Abnormal; Notable for the following components:      Result Value   Glucose, Bld 101 (*)    Total Protein 8.6 (*)    All other components within normal limits  CBC - Abnormal; Notable for the following components:   Hemoglobin 11.9 (*)    All other components within normal limits  URINALYSIS, ROUTINE W REFLEX MICROSCOPIC - Abnormal; Notable for the following components:   APPearance HAZY (*)    Ketones, ur 5 (*)    Leukocytes,Ua SMALL (*)    Bacteria, UA MANY (*)    All other components within normal limits  LIPASE, BLOOD  HCG, QUANTITATIVE, PREGNANCY  I-STAT BETA HCG BLOOD, ED ([facility], [facility], [facility] ONLY)    EKG None  Radiology No results found.  Procedures Procedures    Medications Ordered in ED Medications  sodium chloride 0.9 % bolus 1,000 mL (0 mLs Intravenous Stopped 01/16/22 2019)  HYDROmorphone (DILAUDID) injection 1 mg (1 mg Intravenous Given 01/16/22 1800)  ondansetron (ZOFRAN) injection 4 mg (4 mg Intravenous Given 01/16/22 1800)  acetaminophen (TYLENOL) tablet 650 mg (650 mg Oral Given 01/16/22 1800)    ED Course/ Medical Decision Making/ A&P                           Medical Decision Making Amount and/or Complexity of Data Reviewed Labs: ordered.  Risk OTC drugs. Prescription drug management.  This patient presents to the ED for concern of fever dysuria, this involves an extensive number of treatment options, and is a complaint that carries with it a high risk of complications and morbidity.  The differential diagnosis includes UTI   Co morbidities that complicate the patient evaluation  None   Additional history obtained:  Additional history obtained from patient External records from outside source obtained and reviewed including hospital  records   Lab Tests:  I Ordered, and personally interpreted labs.  The pertinent results include: CBC that is normal.  Urinalysis shows 6-10 white cells and many bacteria   Imaging Studies ordered: No imaging  Cardiac Monitoring: / EKG:  The patient was maintained on a cardiac monitor.  I personally viewed and interpreted the cardiac monitored which showed an underlying rhythm of: Normal sinus rhythm   Consultations Obtained:  No consult  Problem List / ED Course / Critical interventions / Medication management  Fever most likely UTI I ordered medication including Keflex Reevaluation of the patient after these medicines showed that the patient  stayed the same I have reviewed the patients home medicines and have made adjustments as needed   Social Determinants of Health:  None   Test / Admission - Considered:  No other tests considered  Patient with fever and dysuria mild abdominal discomfort.  She will have urine culture done and be placed on Keflex and will follow up with her PCP      Final Clinical Impression(s) / ED Diagnoses Final diagnoses:  None    Rx / DC Orders ED Discharge Orders     None         Bethann Berkshire, MD 01/16/22 2245    Bethann Berkshire, MD 01/16/22 2320

## 2022-01-16 NOTE — Discharge Instructions (Signed)
Drink plenty of fluids.  Take Tylenol Motrin for pain.  Follow-up with your doctor the end of this week for recheck.  Return if any problems

## 2022-01-17 ENCOUNTER — Emergency Department (HOSPITAL_COMMUNITY)
Admission: EM | Admit: 2022-01-17 | Discharge: 2022-01-17 | Disposition: A | Discharge status: Home / Self Care | Payer: Medicaid Other | Attending: Emergency Medicine | Admitting: Emergency Medicine

## 2022-01-17 ENCOUNTER — Other Ambulatory Visit: Payer: Self-pay

## 2022-01-17 ENCOUNTER — Telehealth: Payer: Self-pay

## 2022-01-17 ENCOUNTER — Encounter (HOSPITAL_COMMUNITY): Payer: Self-pay

## 2022-01-17 ENCOUNTER — Emergency Department (HOSPITAL_COMMUNITY): Payer: Medicaid Other

## 2022-01-17 DIAGNOSIS — N189 Chronic kidney disease, unspecified: Secondary | ICD-10-CM | POA: Diagnosis not present

## 2022-01-17 DIAGNOSIS — J45909 Unspecified asthma, uncomplicated: Secondary | ICD-10-CM | POA: Diagnosis not present

## 2022-01-17 DIAGNOSIS — Z20822 Contact with and (suspected) exposure to covid-19: Secondary | ICD-10-CM | POA: Diagnosis not present

## 2022-01-17 DIAGNOSIS — R079 Chest pain, unspecified: Secondary | ICD-10-CM | POA: Diagnosis not present

## 2022-01-17 DIAGNOSIS — R0789 Other chest pain: Secondary | ICD-10-CM | POA: Diagnosis not present

## 2022-01-17 DIAGNOSIS — J029 Acute pharyngitis, unspecified: Secondary | ICD-10-CM | POA: Diagnosis not present

## 2022-01-17 LAB — CBC
HCT: 35.8 % — ABNORMAL LOW (ref 36.0–46.0)
Hemoglobin: 11.4 g/dL — ABNORMAL LOW (ref 12.0–15.0)
MCH: 29.7 pg (ref 26.0–34.0)
MCHC: 31.8 g/dL (ref 30.0–36.0)
MCV: 93.2 fL (ref 80.0–100.0)
Platelets: 244 10*3/uL (ref 150–400)
RBC: 3.84 MIL/uL — ABNORMAL LOW (ref 3.87–5.11)
RDW: 11.9 % (ref 11.5–15.5)
WBC: 6.7 10*3/uL (ref 4.0–10.5)
nRBC: 0 % (ref 0.0–0.2)

## 2022-01-17 LAB — TROPONIN I (HIGH SENSITIVITY): Troponin I (High Sensitivity): <2 ng/L (ref <18)

## 2022-01-17 LAB — BASIC METABOLIC PANEL
Anion gap: 7 (ref 5–15)
BUN: 10 mg/dL (ref 6–20)
CO2: 24 mmol/L (ref 22–32)
Calcium: 9.1 mg/dL (ref 8.9–10.3)
Chloride: 109 mmol/L (ref 98–111)
Creatinine, Ser: 0.67 mg/dL (ref 0.44–1.00)
GFR, Estimated: >60 mL/min (ref >60)
Glucose, Bld: 90 mg/dL (ref 70–99)
Potassium: 3.3 mmol/L — ABNORMAL LOW (ref 3.5–5.1)
Sodium: 140 mmol/L (ref 135–145)

## 2022-01-17 LAB — RESP PANEL BY RT-PCR (FLU A&B, COVID) ARPGX2
Influenza A by PCR: NEGATIVE
Influenza B by PCR: NEGATIVE
SARS Coronavirus 2 by RT PCR: NEGATIVE

## 2022-01-17 LAB — I-STAT BETA HCG BLOOD, ED (MC, WL, AP ONLY): I-stat hCG, quantitative: <5 m[IU]/mL (ref <5)

## 2022-01-17 LAB — GROUP A STREP BY PCR: Group A Strep by PCR: NOT DETECTED

## 2022-01-17 NOTE — Discharge Instructions (Addendum)
You were seen today for sore throat and chest pain. There were no life threatening causes found for your chest pain. You may use Tylenol or Advil for pain relief. You were tested for Covid 19, influenza A and B, and group A strep. All were negative. The sore throat is likely due to a viral illness. I recommend symptom care using over the counter medications.

## 2022-01-17 NOTE — ED Provider Notes (Signed)
[facility]-EMERGENCY DEPT Provider Note   CSN: [id number] Arrival date & time: 01/17/22  1736     History  Chief Complaint  Patient presents with   Chest Pain   Chills   Sore Throat    Frances [name] is a 26 y.o. female.  Patient presents to the hospital complaining of chest pain, sore throat.  Patient states that she has had 3 days of sore throat.  Chest pain began today.  Patient denies a cough.  Patient was seen yesterday at the hospital and diagnosed with a urinary tract infection and was prescribed Keflex currently describes chest pain as central in nature.  Mild tenderness to palpation.  Slightly worsened with movement.  Denies shortness of breath.  Past medical history significant for chronic kidney disease, asthma, seizures, gallstones, anemia, constipation  HPI     Home Medications Prior to Admission medications   Medication Sig Start Date End Date Taking? Authorizing Provider  cephALEXin (KEFLEX) 500 MG capsule Take 1 capsule (500 mg total) by mouth 4 (four) times daily. 01/16/22   Bethann Berkshire, MD  ibuprofen (ADVIL) 600 MG tablet Take 1 tablet (600 mg total) by mouth every 6 (six) hours. 02/25/21   Arabella Merles, CNM  nitrofurantoin, macrocrystal-monohydrate, (MACROBID) 100 MG capsule Take 1 capsule (100 mg total) by mouth 2 (two) times daily. 1 po BId 06/15/21   Daphine Deutscher, Mary-Margaret, FNP  ondansetron (ZOFRAN-ODT) 4 MG disintegrating tablet Take 1 tablet (4 mg total) by mouth every 8 (eight) hours as needed for nausea or vomiting. 05/23/21   Gustavus Bryant, FNP  oseltamivir (TAMIFLU) 75 MG capsule Take 1 capsule (75 mg total) by mouth every 12 (twelve) hours. 07/26/21   Tomi Bamberger, PA-C  Prenatal Vit-Fe Fumarate-FA (PRENATAL MULTIVITAMIN) TABS tablet Take 1 tablet by mouth daily at 12 noon.    [provider]  omeprazole (PRILOSEC) 20 MG capsule Take 1 capsule (20 mg total) by mouth daily. Patient not taking: No sig reported 04/16/20  09/28/20  Belinda Fisher, PA-C      Allergies    Vancomycin    Review of Systems   Review of Systems  Constitutional:  Positive for fever.  HENT:  Positive for sore throat.   Respiratory:  Negative for shortness of breath.   Cardiovascular:  Positive for chest pain.  Gastrointestinal:  Negative for nausea and vomiting.    Physical Exam Updated Vital Signs BP 132/77   Pulse 83   Temp 99.4 F (37.4 C) (Oral)   Resp 18   Ht 5\' 7"  (1.702 m)   Wt 77.1 kg   LMP 12/24/2021   SpO2 99%   BMI 26.63 kg/m  Physical Exam Vitals and nursing note reviewed.  Constitutional:      General: She is not in acute distress.    Appearance: She is normal weight.  HENT:     Head: Normocephalic and atraumatic.     Mouth/Throat:     Pharynx: Uvula midline. Posterior oropharyngeal erythema present. No pharyngeal swelling, oropharyngeal exudate or uvula swelling.     Tonsils: No tonsillar exudate or tonsillar abscesses.  Eyes:     Extraocular Movements: Extraocular movements intact.     Pupils: Pupils are equal, round, and reactive to light.  Cardiovascular:     Rate and Rhythm: Normal rate and regular rhythm.     Heart sounds: Normal heart sounds.  Pulmonary:     Effort: Pulmonary effort is normal.     Breath sounds: Normal  breath sounds.  Chest:     Chest wall: Tenderness present.  Abdominal:     Palpations: Abdomen is soft.     Tenderness: There is no abdominal tenderness.  Musculoskeletal:     Cervical back: Normal range of motion and neck supple.  Lymphadenopathy:     Cervical: Cervical adenopathy present.  Skin:    General: Skin is warm and dry.  Neurological:     Mental Status: She is alert.     ED Results / Procedures / Treatments   Labs (all labs ordered are listed, but only abnormal results are displayed) Labs Reviewed  BASIC METABOLIC PANEL - Abnormal; Notable for the following components:      Result Value   Potassium 3.3 (*)    All other components within normal limits   CBC - Abnormal; Notable for the following components:   RBC 3.84 (*)    Hemoglobin 11.4 (*)    HCT 35.8 (*)    All other components within normal limits  RESP PANEL BY RT-PCR (FLU A&B, COVID) ARPGX2  GROUP A STREP BY PCR  I-STAT BETA HCG BLOOD, ED ([facility], [facility], [facility] ONLY)  TROPONIN I (HIGH SENSITIVITY)    EKG None  Radiology DG Chest 2 View  Result Date: 01/17/2022 CLINICAL DATA:  Chest pain, sore throat EXAM: CHEST - 2 VIEW COMPARISON:  02/02/2020 FINDINGS: Cardiac and mediastinal contours are within normal limits. No focal pulmonary opacity. No pleural effusion or pneumothorax. No acute osseous abnormality. IMPRESSION: No acute cardiopulmonary process. Electronically Signed   By: Wiliam KeAlison  Vasan M.D.   On: 01/17/2022 18:21    Procedures Procedures    Medications Ordered in ED Medications - No data to display  ED Course/ Medical Decision Making/ A&P                           Medical Decision Making Amount and/or Complexity of Data Reviewed Labs: ordered. Radiology: ordered.   This patient presents to the ED for concern of chest pain, this involves an extensive number of treatment options, and is a complaint that carries with it a high risk of complications and morbidity.  The differential diagnosis includes ACS, PE, musculoskeletal pain, and others  The patient also has a complaint of sore throat.  Differential includes strep throat, viral illness, and others   Co morbidities that complicate the patient evaluation  History of asthma   Additional history obtained:   External records from outside source obtained and reviewed including discharge paperwork from yesterday   Lab Tests:  I Ordered, and personally interpreted labs.  The pertinent results include: COVID, influenza AMB negative, initial troponin less than 2, hemoglobin 11.4, potassium 3.3, group A strep negative   Imaging Studies ordered:  I ordered imaging studies including chest x-ray I independently  visualized and interpreted imaging which showed no acute disease I agree with the radiologist interpretation   Cardiac Monitoring: / EKG:  The patient was maintained on a cardiac monitor.  I personally viewed and interpreted the cardiac monitored which showed an underlying rhythm of: Normal sinus rhythm   Test / Admission - Considered:  Heart score of 1 with no recommendation for repeat troponin.  Initial troponin less than 2.  No ischemic changes on EKG.  ACS unlikely.  No shortness of breath, no tachycardia,low clinical suspicion for PE. No signs of dissection. The patient does have tenderness to palpation of her chest and some pain with movement. This seems like  it could be musculoskeletal in nature. Recommend NSAIDS, ice, heat The sore throat is likely viral in nature with the rapid strep test being negative. I will recommend symptom management with OTC medications. Follow up as needed. Discharge home        Final Clinical Impression(s) / ED Diagnoses Final diagnoses:  Pharyngitis, unspecified etiology  Chest pain, unspecified type    Rx / DC Orders ED Discharge Orders     None         Pamala Duffel 01/17/22 2108    Gerhard Munch, MD 01/17/22 2310

## 2022-01-17 NOTE — ED Triage Notes (Signed)
Patient c/o sore throat x 3 days and chest pain that started today. Patient denies a cough. Patient was seen yesterday for a UTI.

## 2022-01-17 NOTE — ED Notes (Signed)
An After Visit Summary was printed and given to the patient. Discharge instructions given and no further questions at this time.  

## 2022-01-17 NOTE — ED Provider Triage Note (Signed)
Emergency Medicine Provider Triage Evaluation Note  Frances [name] , a 26 y.o. female  was evaluated in triage.  Pt complains of chest pain/tightness, sore throat, body aches.  Patient states that the sore throat has existed for 3 days.  The chest pain began today.  The body aches began today as well.  The patient was evaluated yesterday in the emergency department and prescribed Keflex for a urinary tract infection.  States she has been feeling worse since going home.  Also endorses neck pain/tension with the tension going into the back of her head.  Denies shortness of breath, cough.  Review of Systems  Positive: Chest pain, sore throat, body aches, headache Negative: Shortness of breath, cough  Physical Exam  BP (!) 140/95 (BP Location: Left Arm)   Pulse 98   Temp 99.4 F (37.4 C) (Oral)   Resp 18   Ht 5\' 7"  (1.702 m)   Wt 77.1 kg   LMP 12/24/2021   SpO2 100%   BMI 26.63 kg/m  Gen:   Awake, no distress   Resp:  Normal effort  MSK:   Moves extremities without difficulty  Other:    Medical Decision Making  Medically screening exam initiated at 6:04 PM.  Appropriate orders placed.  Frederick Klinger was informed that the remainder of the evaluation will be completed by another provider, this initial triage assessment does not replace that evaluation, and the importance of remaining in the ED until their evaluation is complete.     [name], PA-C 01/17/22 1806

## 2022-01-17 NOTE — Telephone Encounter (Signed)
Transition Care Management Follow-up Telephone Call Date of discharge and from where: 01/17/2022 from [facility] How have you been since you were released from the hospital? Patient stated that she is feeling a little worse. Patient stated that she has started having chest pain after returning home and has not stopped since. Pain was rated 5/10 and stated it felt like pressure. Patient stated that her throat is also hurting. I asked patient if there was some one that could take her to be reevaluated and patient stated yes.  Any questions or concerns? No  Items Reviewed: Did the pt receive and understand the discharge instructions provided? Yes  Medications obtained and verified? Yes  Other? No  Any new allergies since your discharge? No  Dietary orders reviewed? No Do you have support at home? Yes   Functional Questionnaire: (I = Independent and D = Dependent) ADLs: I  Bathing/Dressing- I  Meal Prep- I  Eating- I  Maintaining continence- I  Transferring/Ambulation- I  Managing Meds- I   Follow up appointments reviewed:  PCP Hospital f/u appt confirmed? No   Specialist Hospital f/u appt confirmed? No   Are transportation arrangements needed? No  If their condition worsens, is the pt aware to call PCP or go to the Emergency Dept.? Yes Was the patient provided with contact information for the PCP's office or ED? Yes Was to pt encouraged to call back with questions or concerns? Yes

## 2022-01-17 NOTE — ED Notes (Addendum)
Pt resting comfortably at this time.

## 2022-01-18 ENCOUNTER — Telehealth: Payer: Self-pay

## 2022-01-18 LAB — URINE CULTURE: Culture: <10000 — AB

## 2022-01-18 NOTE — Telephone Encounter (Signed)
Transition Care Management Follow-up Telephone Call Date of discharge and from where: 01/17/2022 from [facility] How have you been since you were released from the hospital? Patient stated that her throat is still hurting but chest pain is better. Patient did not have any questions or concerns at this time.  Any questions or concerns? No  Items Reviewed: Did the pt receive and understand the discharge instructions provided? Yes  Medications obtained and verified? Yes  Other? No  Any new allergies since your discharge? No  Dietary orders reviewed? No Do you have support at home? Yes   Functional Questionnaire: (I = Independent and D = Dependent) ADLs: I  Bathing/Dressing- I  Meal Prep- I  Eating- I  Maintaining continence- I  Transferring/Ambulation- I  Managing Meds- I   Follow up appointments reviewed:  PCP Hospital f/u appt confirmed? No  Specialist Hospital f/u appt confirmed? No   Are transportation arrangements needed? No  If their condition worsens, is the pt aware to call PCP or go to the Emergency Dept.? Yes Was the patient provided with contact information for the PCP's office or ED? Yes Was to pt encouraged to call back with questions or concerns? Yes

## 2022-01-21 LAB — GC/CHLAMYDIA PROBE AMP (~~LOC~~) NOT AT ARMC
Chlamydia: NEGATIVE
Comment: NEGATIVE
Comment: NORMAL
Neisseria Gonorrhea: NEGATIVE

## 2022-02-20 ENCOUNTER — Ambulatory Visit
Admission: RE | Admit: 2022-02-20 | Discharge: 2022-02-20 | Disposition: A | Discharge status: Home / Self Care | Payer: Medicaid Other | Source: Ambulatory Visit | Attending: Internal Medicine | Admitting: Internal Medicine

## 2022-02-20 VITALS — BP 130/90 | HR 82 | Temp 98.0°F | Resp 18

## 2022-02-20 DIAGNOSIS — N390 Urinary tract infection, site not specified: Secondary | ICD-10-CM | POA: Diagnosis not present

## 2022-02-20 LAB — POCT URINALYSIS DIP (MANUAL ENTRY)
Bilirubin, UA: NEGATIVE
Glucose, UA: NEGATIVE mg/dL
Ketones, POC UA: NEGATIVE mg/dL
Nitrite, UA: NEGATIVE
Protein Ur, POC: NEGATIVE mg/dL
Spec Grav, UA: 1.025 (ref 1.010–1.025)
Urobilinogen, UA: 0.2 E.U./dL
pH, UA: 6 (ref 5.0–8.0)

## 2022-02-20 MED ORDER — NITROFURANTOIN MONOHYD MACRO 100 MG PO CAPS: 100.0000 mg | ORAL_CAPSULE | Freq: Two times a day (BID) | ORAL | 0 refills | Status: DC

## 2022-02-20 MED ORDER — NITROFURANTOIN MONOHYD MACRO 100 MG PO CAPS: 100.0000 mg | ORAL_CAPSULE | Freq: Two times a day (BID) | ORAL | Status: DC

## 2022-02-20 NOTE — ED Provider Notes (Signed)
[name]   MRN: [id number] DOB: [birth year]  Subjective:   Chief Complaint;  Chief Complaint  Patient presents with   Urinary Frequency    Entered by patient    Frances [name] is a 26 y.o. female presenting for dysuria signs of urinary tract infection for the last 4 days without nausea or vomiting.  She denies fever or vaginal discharge.   Current Facility-Administered Medications:    nitrofurantoin (macrocrystal-monohydrate) (MACROBID) capsule 100 mg, 100 mg, Oral, Q12H, Jone Baseman, NP  Current Outpatient Medications:    nitrofurantoin, macrocrystal-monohydrate, (MACROBID) 100 MG capsule, Take 1 capsule (100 mg total) by mouth 2 (two) times daily., Disp: 10 capsule, Rfl: 0   cephALEXin (KEFLEX) 500 MG capsule, Take 1 capsule (500 mg total) by mouth 4 (four) times daily., Disp: 28 capsule, Rfl: 0   ibuprofen (ADVIL) 600 MG tablet, Take 1 tablet (600 mg total) by mouth every 6 (six) hours., Disp: 30 tablet, Rfl: 0   ondansetron (ZOFRAN-ODT) 4 MG disintegrating tablet, Take 1 tablet (4 mg total) by mouth every 8 (eight) hours as needed for nausea or vomiting., Disp: 20 tablet, Rfl: 0   oseltamivir (TAMIFLU) 75 MG capsule, Take 1 capsule (75 mg total) by mouth every 12 (twelve) hours., Disp: 10 capsule, Rfl: 0   Prenatal Vit-Fe Fumarate-FA (PRENATAL MULTIVITAMIN) TABS tablet, Take 1 tablet by mouth daily at 12 noon., Disp: , Rfl:    Allergies  Allergen Reactions   Vancomycin Other (See Comments)    Nephrotoxicity    Past Medical History:  Diagnosis Date   Anemia    on Iron supplements   Asthma 1999   as a child; no current problems   Chronic kidney disease    as a child   Constipation    Gall stones    Gallstones 10/23/2016   10/2016: Seen by [facility], pt states, and plans for PP follow up. Advised her on low fat diet   Seizures (HCC)    seizure disorder as a child, treated with medication; none since     Review of Systems  All other systems  reviewed and are negative.    Objective:   Vitals: BP 130/90 (BP Location: Right Arm)   Pulse 82   Temp 98 F (36.7 C) (Oral)   Resp 18   SpO2 98%   Breastfeeding No   Physical Exam Vitals and nursing note reviewed.  Constitutional:      General: She is not in acute distress.    Appearance: She is well-developed.  HENT:     Head: Normocephalic and atraumatic.  Eyes:     Conjunctiva/sclera: Conjunctivae normal.  Cardiovascular:     Rate and Rhythm: Normal rate and regular rhythm.     Heart sounds: No murmur heard. Pulmonary:     Effort: Pulmonary effort is normal. No respiratory distress.     Breath sounds: Normal breath sounds.  Abdominal:     Palpations: Abdomen is soft.     Tenderness: There is abdominal tenderness. There is no right CVA tenderness or left CVA tenderness.     Comments: Suprapubic tenderness direct mild  Musculoskeletal:        General: No swelling.     Cervical back: Neck supple.  Skin:    General: Skin is warm and dry.     Capillary Refill: Capillary refill takes less than 2 seconds.  Neurological:     Mental Status: She is alert.  Psychiatric:  Mood and Affect: Mood normal.     Results for orders placed or performed during the hospital encounter of 02/20/22 (from the past 24 hour(s))  POCT urinalysis dipstick     Status: Abnormal   Collection Time: 02/20/22  6:32 PM  Result Value Ref Range   Color, UA yellow yellow   Clarity, UA clear clear   Glucose, UA negative negative mg/dL   Bilirubin, UA negative negative   Ketones, POC UA negative negative mg/dL   Spec Grav, UA 5.956 3.875 - 1.025   Blood, UA trace-intact (A) negative   pH, UA 6.0 5.0 - 8.0   Protein Ur, POC negative negative mg/dL   Urobilinogen, UA 0.2 0.2 or 1.0 E.U./dL   Nitrite, UA Negative Negative   Leukocytes, UA Large (3+) (A) Negative    No results found.     Assessment and Plan :   1. Urinary tract infection without hematuria, site unspecified      Meds ordered this encounter  Medications   nitrofurantoin (macrocrystal-monohydrate) (MACROBID) capsule 100 mg   nitrofurantoin, macrocrystal-monohydrate, (MACROBID) 100 MG capsule    Sig: Take 1 capsule (100 mg total) by mouth 2 (two) times daily.    Dispense:  10 capsule    Refill:  0    Order Specific Question:   Supervising Provider    Answer:   [name] [[id number]]    MDM:  Frances [name] is a 27 y.o. female presenting for dysuria symptoms of urine infection over the last 4 days without fever or significant abdominal pain or nausea diarrhea vomiting.  Her vital signs are unremarkable.  She has mild tenderness over the suprapubic region and no CVA tenderness.  Urine dip is positive for infection with large leuks and trace blood.  Prescription is written for Macrobid.  He is encouraged to increase her fluids and follow-up with PCP she is agreeable with this plan.  I discussed todays findings, treatment plan, follow up and return instructions. Questions were answered. Patient/representative stated understanding of the instructions and patient is stable for discharge.  [name] FNP-C MSN     Jone Baseman, NP 02/20/22 1840

## 2022-02-20 NOTE — Discharge Instructions (Addendum)
Drink plenty of fluids and take antibiotic as directed.  The urine today was positive for infection.  Culture is pending return at anytime for new or worsening symptoms for reevaluation

## 2022-02-20 NOTE — ED Triage Notes (Signed)
Pt c/o frequency and urgency,   Onset ~ 4-5

## 2022-02-23 LAB — URINE CULTURE: Culture: >=100000 — AB

## 2022-03-30 ENCOUNTER — Emergency Department (HOSPITAL_COMMUNITY): Payer: Medicaid Other

## 2022-03-30 ENCOUNTER — Other Ambulatory Visit: Payer: Self-pay

## 2022-03-30 ENCOUNTER — Emergency Department (HOSPITAL_COMMUNITY)
Admission: EM | Admit: 2022-03-30 | Discharge: 2022-03-31 | Disposition: A | Discharge status: Home / Self Care | Payer: Medicaid Other | Attending: Emergency Medicine | Admitting: Emergency Medicine

## 2022-03-30 ENCOUNTER — Encounter (HOSPITAL_COMMUNITY): Payer: Self-pay

## 2022-03-30 DIAGNOSIS — M545 Low back pain, unspecified: Secondary | ICD-10-CM | POA: Diagnosis not present

## 2022-03-30 DIAGNOSIS — R109 Unspecified abdominal pain: Secondary | ICD-10-CM

## 2022-03-30 DIAGNOSIS — K8020 Calculus of gallbladder without cholecystitis without obstruction: Secondary | ICD-10-CM | POA: Diagnosis not present

## 2022-03-30 DIAGNOSIS — Z859 Personal history of malignant neoplasm, unspecified: Secondary | ICD-10-CM | POA: Insufficient documentation

## 2022-03-30 DIAGNOSIS — M549 Dorsalgia, unspecified: Secondary | ICD-10-CM

## 2022-03-30 DIAGNOSIS — R9431 Abnormal electrocardiogram [ECG] [EKG]: Secondary | ICD-10-CM | POA: Diagnosis not present

## 2022-03-30 DIAGNOSIS — J45909 Unspecified asthma, uncomplicated: Secondary | ICD-10-CM | POA: Insufficient documentation

## 2022-03-30 LAB — CBC WITH DIFFERENTIAL/PLATELET
Abs Immature Granulocytes: 0.01 10*3/uL (ref 0.00–0.07)
Basophils Absolute: 0 10*3/uL (ref 0.0–0.1)
Basophils Relative: 1 %
Eosinophils Absolute: 0.1 10*3/uL (ref 0.0–0.5)
Eosinophils Relative: 2 %
HCT: 32.5 % — ABNORMAL LOW (ref 36.0–46.0)
Hemoglobin: 10.5 g/dL — ABNORMAL LOW (ref 12.0–15.0)
Immature Granulocytes: 0 %
Lymphocytes Relative: 35 %
Lymphs Abs: 1.5 10*3/uL (ref 0.7–4.0)
MCH: 29.3 pg (ref 26.0–34.0)
MCHC: 32.3 g/dL (ref 30.0–36.0)
MCV: 90.8 fL (ref 80.0–100.0)
Monocytes Absolute: 0.3 10*3/uL (ref 0.1–1.0)
Monocytes Relative: 6 %
Neutro Abs: 2.4 10*3/uL (ref 1.7–7.7)
Neutrophils Relative %: 56 %
Platelets: 315 10*3/uL (ref 150–400)
RBC: 3.58 MIL/uL — ABNORMAL LOW (ref 3.87–5.11)
RDW: 13.4 % (ref 11.5–15.5)
WBC: 4.2 10*3/uL (ref 4.0–10.5)
nRBC: 0 % (ref 0.0–0.2)

## 2022-03-30 LAB — URINALYSIS, ROUTINE W REFLEX MICROSCOPIC
Bilirubin Urine: NEGATIVE
Glucose, UA: NEGATIVE mg/dL
Ketones, ur: NEGATIVE mg/dL
Leukocytes,Ua: NEGATIVE
Nitrite: NEGATIVE
Protein, ur: NEGATIVE mg/dL
Specific Gravity, Urine: 1.017 (ref 1.005–1.030)
pH: 8 (ref 5.0–8.0)

## 2022-03-30 LAB — LIPASE, BLOOD: Lipase: 37 U/L (ref 11–51)

## 2022-03-30 LAB — COMPREHENSIVE METABOLIC PANEL
ALT: 21 U/L (ref 0–44)
AST: 31 U/L (ref 15–41)
Albumin: 3.8 g/dL (ref 3.5–5.0)
Alkaline Phosphatase: 46 U/L (ref 38–126)
Anion gap: 3 — ABNORMAL LOW (ref 5–15)
BUN: 11 mg/dL (ref 6–20)
CO2: 24 mmol/L (ref 22–32)
Calcium: 8.8 mg/dL — ABNORMAL LOW (ref 8.9–10.3)
Chloride: 114 mmol/L — ABNORMAL HIGH (ref 98–111)
Creatinine, Ser: 0.79 mg/dL (ref 0.44–1.00)
GFR, Estimated: >60 mL/min (ref >60)
Glucose, Bld: 82 mg/dL (ref 70–99)
Potassium: 3.5 mmol/L (ref 3.5–5.1)
Sodium: 141 mmol/L (ref 135–145)
Total Bilirubin: 0.4 mg/dL (ref 0.3–1.2)
Total Protein: 6.8 g/dL (ref 6.5–8.1)

## 2022-03-30 LAB — HCG, QUANTITATIVE, PREGNANCY: hCG, Beta Chain, Quant, S: <1 m[IU]/mL (ref <5)

## 2022-03-30 MED ORDER — KETOROLAC TROMETHAMINE 15 MG/ML IJ SOLN
15.0000 mg | Freq: Once | INTRAMUSCULAR | Status: AC
  Administered 2022-03-30: 15 mg via INTRAVENOUS
  Filled 2022-03-30: qty 1

## 2022-03-30 NOTE — ED Provider Triage Note (Signed)
Emergency Medicine Provider Triage Evaluation Note  Frances [name] , [name] 26 y.o. female  was evaluated in triage.  Pt complains of bilateral back pain onset 2 weeks. Pt reports that her back pain is intermittent and sharp. Also notes intermittent, sharp abdominal pain. Notes that she has one kidney that is smaller than the other. Still has her gallbladder and appendix. Denies urinary symptoms, fever, vaginal bleeding/discharge. Denies history of kidney stones. No injury or trauma noted to the back.   Review of Systems  Positive: As per HPI Negative:   Physical Exam  BP 128/79   Pulse 79   Temp 98.3 F (36.8 C) (Oral)   SpO2 100%  Gen:   Awake, no distress   Resp:  Normal effort  MSK:   Moves extremities without difficulty  Other:  No abdominal, chest wall, or bilateral CVA TTP.  Medical Decision Making  Medically screening exam initiated at 4:35 PM.  Appropriate orders placed.  Frances [name] was informed that the remainder of the evaluation will be completed by another provider, this initial triage assessment does not replace that evaluation, and the importance of remaining in the ED until their evaluation is complete.  Work-up initiated.    Frances [name] A, PA-C 03/30/22 1646

## 2022-03-30 NOTE — ED Triage Notes (Signed)
Pt c/o lower back pain and pain across her abdomen that radiates to her chest for the past two weeks. Pt denies sob or any other associated symptoms.

## 2022-03-30 NOTE — ED Provider Notes (Signed)
[facility]-EMERGENCY DEPT Provider Note   CSN: [id number] Arrival date & time: 03/30/22  1609     History {Add pertinent medical, surgical, social history, OB history to HPI:1} Chief Complaint  Patient presents with   Abdominal Pain   Back Pain    Frances [name] is a 26 y.o. female with a hx of anemia and asthma who presents to the emergency department with complaints of back pain and abdominal pain for the past 2 weeks.  Patient reports the pain is in her lower back it is fairly steady, sometimes she has difficulty getting comfortable, no alleviating or aggravating factors.  Also having some pain in her abdomen in variable locations, can be sharp in nature and can be crampy, intermittent, no alleviating or aggravating factors.  She is currently menstruating, started her period 08/17.  She denies fever, chills, nausea, vomiting, diarrhea, dysuria, vaginal discharge, numbness, tingling, weakness, saddle anesthesia, incontinence to bowel/bladder, fIV drug use, dysuria, or hx of cancer.   HPI     Home Medications Prior to Admission medications   Medication Sig Start Date End Date Taking? Authorizing Provider  cephALEXin (KEFLEX) 500 MG capsule Take 1 capsule (500 mg total) by mouth 4 (four) times daily. 01/16/22   Bethann Berkshire, MD  ibuprofen (ADVIL) 600 MG tablet Take 1 tablet (600 mg total) by mouth every 6 (six) hours. 02/25/21   Arabella Merles, CNM  nitrofurantoin, macrocrystal-monohydrate, (MACROBID) 100 MG capsule Take 1 capsule (100 mg total) by mouth 2 (two) times daily. 02/20/22   Jone Baseman, NP  ondansetron (ZOFRAN-ODT) 4 MG disintegrating tablet Take 1 tablet (4 mg total) by mouth every 8 (eight) hours as needed for nausea or vomiting. 05/23/21   Gustavus Bryant, FNP  oseltamivir (TAMIFLU) 75 MG capsule Take 1 capsule (75 mg total) by mouth every 12 (twelve) hours. 07/26/21   Tomi Bamberger, PA-C  Prenatal Vit-Fe Fumarate-FA (PRENATAL MULTIVITAMIN) TABS  tablet Take 1 tablet by mouth daily at 12 noon.    [provider]  omeprazole (PRILOSEC) 20 MG capsule Take 1 capsule (20 mg total) by mouth daily. Patient not taking: No sig reported 04/16/20 09/28/20  Belinda Fisher, PA-C      Allergies    Vancomycin    Review of Systems   Review of Systems  Constitutional:  Negative for chills and fever.  Respiratory:  Negative for shortness of breath.   Gastrointestinal:  Positive for abdominal pain. Negative for diarrhea and vomiting.  Genitourinary:  Positive for vaginal bleeding (menses). Negative for dysuria, hematuria and vaginal discharge.  Musculoskeletal:  Positive for back pain.  Neurological:  Negative for weakness and numbness.  All other systems reviewed and are negative.   Physical Exam Updated Vital Signs BP 93/78   Pulse 67   Temp 98.5 F (36.9 C)   Resp 16   Ht 5\' 7"  (1.702 m)   Wt 81.6 kg   SpO2 100%   BMI 28.19 kg/m  Physical Exam Vitals and nursing note reviewed. Exam conducted with a chaperone present.  Constitutional:      General: She is not in acute distress.    Appearance: She is well-developed. She is not toxic-appearing.  HENT:     Head: Normocephalic and atraumatic.  Eyes:     General:        Right eye: No discharge.        Left eye: No discharge.     Conjunctiva/sclera: Conjunctivae normal.  Cardiovascular:  Rate and Rhythm: Normal rate and regular rhythm.  Pulmonary:     Effort: No respiratory distress.     Breath sounds: Normal breath sounds. No wheezing or rales.  Abdominal:     General: There is no distension.     Palpations: Abdomen is soft.     Tenderness: There is no abdominal tenderness. There is no guarding or rebound.  Genitourinary:    Labia:        Right: No lesion.      Vagina: Bleeding present. No vaginal discharge.     Cervix: No cervical motion tenderness.     Adnexa:        Right: No tenderness.         Left: No tenderness.    Musculoskeletal:     Cervical back: Normal  range of motion and neck supple. No spinous process tenderness or muscular tenderness.     Comments: No obvious deformity, appreciable swelling, erythema, ecchymosis, significant open wounds, or increased warmth.  Back: No point/focal vertebral tenderness, no palpable step off or crepitus.  Lower extremities: ranging @ all major joints. No focal bony tenderness.   Skin:    General: Skin is warm and dry.     Findings: No rash.  Neurological:     Mental Status: She is alert.     Comments: Sensation grossly intact to bilateral lower extremities. 5/5 symmetric strength with plantar/dorsiflexion bilaterally. Gait is intact without obvious foot drop.   Psychiatric:        Behavior: Behavior normal.     ED Results / Procedures / Treatments   Labs (all labs ordered are listed, but only abnormal results are displayed) Labs Reviewed  CBC WITH DIFFERENTIAL/PLATELET - Abnormal; Notable for the following components:      Result Value   RBC 3.58 (*)    Hemoglobin 10.5 (*)    HCT 32.5 (*)    All other components within normal limits  COMPREHENSIVE METABOLIC PANEL - Abnormal; Notable for the following components:   Chloride 114 (*)    Calcium 8.8 (*)    Anion gap 3 (*)    All other components within normal limits  URINALYSIS, ROUTINE W REFLEX MICROSCOPIC - Abnormal; Notable for the following components:   Hgb urine dipstick SMALL (*)    Bacteria, UA RARE (*)    All other components within normal limits  LIPASE, BLOOD  HCG, QUANTITATIVE, PREGNANCY    EKG None  Radiology No results found.  Procedures Procedures  {Document cardiac monitor, telemetry assessment procedure when appropriate:1}  Medications Ordered in ED Medications - No data to display  ED Course/ Medical Decision Making/ A&P                           Medical Decision Making Amount and/or Complexity of Data Reviewed Labs: ordered.   Patient presents to the ED with complaints of ***, this involves an extensive  number of treatment options, and is a complaint that carries with it a high risk of complications and morbidity. Nontoxic, vitals ***.   Ddx including but not limited to: ***  Additional history obtained:  Chart/nursing notes reviewed***.  External records viewed including: ***  EKG: ***  Lab Tests:  I viewed & interpreted labs including:  CBC: mild anemia CMP: no critical electrolyte derangement, renal function & LFTs WNL Lipase: WNL Preg test: Negative UA: blood- consistent w/ menses   Imaging Studies:  I ordered and viewed the following  imaging, agree with radiologist impression:  ***  ED Course:  I ordered medications including *** for ***  ***: RE-EVAL: ***   Based on patient's chief complaint, I considered admission might be necessary, however after reassuring ED workup feel patient is reasonable for discharge.   Critical Interventions: ***  Social determinants: *** Social determinants of health: this may be used for patients who have homelessness, poor access to medical care, need assistance with transition of care for placement, medication or transportation or follow up. You may also use the section for patients who struggle with substance abuse, have dementia, nursing home patients, patients who are severely disabled or have some degree of mental retardation and are not able to fully participate in their care.   Portions of this note were generated with Scientist, clinical (histocompatibility and immunogenetics). Dictation errors may occur despite best attempts at proofreading.   {Document critical care time when appropriate:1} {Document review of labs and clinical decision tools ie heart score, Chads2Vasc2 etc:1}  {Document your independent review of radiology images, and any outside records:1} {Document your discussion with family members, caretakers, and with consultants:1} {Document social determinants of health affecting pt's care:1} {Document your decision making why or why not admission,  treatments were needed:1} Final Clinical Impression(s) / ED Diagnoses Final diagnoses:  None    Rx / DC Orders ED Discharge Orders     None

## 2022-03-31 LAB — WET PREP, GENITAL
Sperm: NONE SEEN
Trich, Wet Prep: NONE SEEN
WBC, Wet Prep HPF POC: <10 (ref <10)
Yeast Wet Prep HPF POC: NONE SEEN

## 2022-03-31 MED ORDER — NAPROXEN 500 MG PO TABS: 500.0000 mg | ORAL_TABLET | Freq: Two times a day (BID) | ORAL | 0 refills | Status: DC | PRN

## 2022-03-31 MED ORDER — METHOCARBAMOL 500 MG PO TABS
500.0000 mg | ORAL_TABLET | Freq: Three times a day (TID) | ORAL | 0 refills | Status: DC | PRN
Start: 2022-03-31 — End: 2022-08-09

## 2022-03-31 NOTE — Discharge Instructions (Addendum)
You were seen in the emergency department today for back and abdominal pain.  Your labs overall appeared similar to priors, you do have some mild anemia.  Your CT scan showed gallstones but no other significant abnormalities.  We are sending you home with the following medicines to try to help with your symptoms:  - Naproxen- this is a nonsteroidal anti-inflammatory medication that will help with pain and swelling. Be sure to take this medication as prescribed with food, 1 pill every 12 hours,  It should be taken with food, as it can cause stomach upset, and more seriously, stomach bleeding. Do not take other nonsteroidal anti-inflammatory medications with this such as Advil, Motrin, Aleve, Mobic, Goodie Powder, or Motrin etc..    - Robaxin- this is the muscle relaxer I have prescribed, this is meant to help with muscle tightness/spasms. Be aware that this medication may make you drowsy therefore the first time you take this it should be at a time you are in an environment where you can rest. Do not drive or operate heavy machinery when taking this medication. Do not drink alcohol or take other sedating medications with this medicine such as narcotics or benzodiazepines.   You make take Tylenol per over the counter dosing with these medications.   We have prescribed you new medication(s) today. Discuss the medications prescribed today with your pharmacist as they can have adverse effects and interactions with your other medicines including over the counter and prescribed medications. Seek medical evaluation if you start to experience new or abnormal symptoms after taking one of these medicines, seek care immediately if you start to experience difficulty breathing, feeling of your throat closing, facial swelling, or rash as these could be indications of a more serious allergic reaction  Please follow-up with primary care soon as possible for recheck of your symptoms.  We have also prided with GI follow-up  as needed.  Return to the ER for any new or worsening symptoms including but not limited to new or worsening pain, fever, inability to keep fluids down, passing out, blood in your vomit or stool, or any other concerns.

## 2022-04-01 LAB — GC/CHLAMYDIA PROBE AMP (~~LOC~~) NOT AT ARMC
Chlamydia: NEGATIVE
Comment: NEGATIVE
Comment: NORMAL
Neisseria Gonorrhea: NEGATIVE

## 2022-06-29 ENCOUNTER — Telehealth: Payer: Medicaid Other | Admitting: Nurse Practitioner

## 2022-06-29 DIAGNOSIS — R399 Unspecified symptoms and signs involving the genitourinary system: Secondary | ICD-10-CM

## 2022-06-29 MED ORDER — CEPHALEXIN 500 MG PO CAPS: 500.0000 mg | ORAL_CAPSULE | Freq: Two times a day (BID) | ORAL | 0 refills | Status: DC

## 2022-06-29 NOTE — Patient Instructions (Signed)
  Frances [name], thank you for joining [name], FNP for today's virtual visit.  While this provider is not your primary care provider (PCP), if your PCP is located in our provider database this encounter information will be shared with them immediately following your visit.   A [facility] MyChart account gives you access to today's visit and all your visits, tests, and labs performed at [facility] " click here if you don't have a Frances [name] MyChart account or go to [email or web address]  Consent: (Patient) Frances [name] provided verbal consent for this virtual visit at the beginning of the encounter.  Current Medications:  Current Outpatient Medications:    cephALEXin (KEFLEX) 500 MG capsule, Take 1 capsule (500 mg total) by mouth 2 (two) times daily., Disp: 14 capsule, Rfl: 0   ibuprofen (ADVIL) 600 MG tablet, Take 1 tablet (600 mg total) by mouth every 6 (six) hours., Disp: 30 tablet, Rfl: 0   methocarbamol (ROBAXIN) 500 MG tablet, Take 1 tablet (500 mg total) by mouth every 8 (eight) hours as needed for muscle spasms., Disp: 15 tablet, Rfl: 0   naproxen (NAPROSYN) 500 MG tablet, Take 1 tablet (500 mg total) by mouth 2 (two) times daily as needed for moderate pain., Disp: 15 tablet, Rfl: 0   nitrofurantoin, macrocrystal-monohydrate, (MACROBID) 100 MG capsule, Take 1 capsule (100 mg total) by mouth 2 (two) times daily., Disp: 10 capsule, Rfl: 0   ondansetron (ZOFRAN-ODT) 4 MG disintegrating tablet, Take 1 tablet (4 mg total) by mouth every 8 (eight) hours as needed for nausea or vomiting., Disp: 20 tablet, Rfl: 0   oseltamivir (TAMIFLU) 75 MG capsule, Take 1 capsule (75 mg total) by mouth every 12 (twelve) hours., Disp: 10 capsule, Rfl: 0   Prenatal Vit-Fe Fumarate-FA (PRENATAL MULTIVITAMIN) TABS tablet, Take 1 tablet by mouth daily at 12 noon., Disp: , Rfl:    Medications ordered in this encounter:  Meds ordered this encounter  Medications   cephALEXin  (KEFLEX) 500 MG capsule    Sig: Take 1 capsule (500 mg total) by mouth 2 (two) times daily.    Dispense:  14 capsule    Refill:  0    Order Specific Question:   Supervising Provider    Answer:   [name] [id number]     *If you need refills on other medications prior to your next appointment, please contact your pharmacy*  Follow-Up: Call back or seek an in-person evaluation if the symptoms worsen or if the condition fails to improve as anticipated.  [facility] [phone]  Other Instructions Take medication as prescribe Cotton underwear Take shower not bath Cranberry juice, yogurt Force fluids AZO over the counter X2 days RTO prn    If you have been instructed to have an in-person evaluation today at a local Urgent Care facility, please use the link below. It will take you to a list of all of our available [facility], including address, phone number and hours of operation. Please do not delay care.  [facility]  If you or a family member do not have a primary care provider, use the link below to schedule a visit and establish care. When you choose a [facility] primary care physician or advanced practice provider, you gain a long-term partner in health. Find a Primary Care Provider  Learn more about [facility]'s in-office and virtual care options: [facility] - Get Care Now

## 2022-06-29 NOTE — Progress Notes (Signed)
Virtual Visit Consent   Frances [name], you are scheduled for a virtual visit with Frances Daphine Deutscher, FNP, a [facility] provider, today.     Just as with appointments in the office, your consent must be obtained to participate.  Your consent will be active for this visit and any virtual visit you may have with one of our providers in the next 365 days.     If you have a MyChart account, a copy of this consent can be sent to you electronically.  All virtual visits are billed to your insurance company just like a traditional visit in the office.    As this is a virtual visit, video technology does not allow for your provider to perform a traditional examination.  This may limit your provider's ability to fully assess your condition.  If your provider identifies any concerns that need to be evaluated in person or the need to arrange testing (such as labs, EKG, etc.), we will make arrangements to do so.     Although advances in technology are sophisticated, we cannot ensure that it will always work on either your end or our end.  If the connection with a video visit is poor, the visit may have to be switched to a telephone visit.  With either a video or telephone visit, we are not always able to ensure that we have a secure connection.     I need to obtain your verbal consent now.   Are you willing to proceed with your visit today? YES   Quantia Grullon has provided verbal consent on 06/29/2022 for a virtual visit (video or telephone).   Frances Daphine Deutscher, FNP   Date: 06/29/2022 1:18 PM   Virtual Visit via Video Note   I, Frances Floetta Brickey, connected with Frances [name] ([id number], Jul 10, 1996) on 06/29/22 at  1:30 PM EST by a video-enabled telemedicine application and verified that I am speaking with the correct person using two identifiers.  Location: Patient: Virtual Visit Location Patient: Home Provider: Virtual Visit Location Provider: Mobile   I discussed the limitations  of evaluation and management by telemedicine and the availability of in person appointments. The patient expressed understanding and agreed to proceed.    History of Present Illness: Frances [name] is a 26 y.o. who identifies as a female who was assigned female at birth, and is being seen today for uti.  HPI: Urinary Tract Infection  This is a new problem. The current episode started in the past 7 days. The problem occurs every urination. The problem has been waxing and waning. The quality of the pain is described as burning. The pain is at a severity of 7/10. There has been no fever. She is Not sexually active. Associated symptoms include hesitancy, a possible pregnancy (LMP 05/31/22) and urgency. Pertinent negatives include no frequency. She has tried nothing for the symptoms.    Review of Systems  Genitourinary:  Positive for hesitancy and urgency. Negative for frequency.    Problems:  Patient Active Problem List   Diagnosis Date Noted   Poor fetal growth 02/23/2021   Vaginal delivery 02/23/2021   Maternal care for poor fetal growth 01/17/2021   COVID-19 09/06/2020   Supervision of high risk pregnancy, antepartum 08/08/2020   Lesion of skin of scalp 01/03/2020   Breast nodule 06/07/2019   History of asthma 08/18/2018   History of seizure disorder 08/18/2018   History of kidney disease as a child 08/18/2018   History of recurrent UTIs 09/16/2016  Allergies:  Allergies  Allergen Reactions   Vancomycin Other (See Comments)    Nephrotoxicity   Medications:  Current Outpatient Medications:    cephALEXin (KEFLEX) 500 MG capsule, Take 1 capsule (500 mg total) by mouth 4 (four) times daily., Disp: 28 capsule, Rfl: 0   ibuprofen (ADVIL) 600 MG tablet, Take 1 tablet (600 mg total) by mouth every 6 (six) hours., Disp: 30 tablet, Rfl: 0   methocarbamol (ROBAXIN) 500 MG tablet, Take 1 tablet (500 mg total) by mouth every 8 (eight) hours as needed for muscle spasms., Disp: 15 tablet,  Rfl: 0   naproxen (NAPROSYN) 500 MG tablet, Take 1 tablet (500 mg total) by mouth 2 (two) times daily as needed for moderate pain., Disp: 15 tablet, Rfl: 0   nitrofurantoin, macrocrystal-monohydrate, (MACROBID) 100 MG capsule, Take 1 capsule (100 mg total) by mouth 2 (two) times daily., Disp: 10 capsule, Rfl: 0   ondansetron (ZOFRAN-ODT) 4 MG disintegrating tablet, Take 1 tablet (4 mg total) by mouth every 8 (eight) hours as needed for nausea or vomiting., Disp: 20 tablet, Rfl: 0   oseltamivir (TAMIFLU) 75 MG capsule, Take 1 capsule (75 mg total) by mouth every 12 (twelve) hours., Disp: 10 capsule, Rfl: 0   Prenatal Vit-Fe Fumarate-FA (PRENATAL MULTIVITAMIN) TABS tablet, Take 1 tablet by mouth daily at 12 noon., Disp: , Rfl:   Observations/Objective: Patient is well-developed, well-nourished in no acute distress.  Resting comfortably  at home.  Head is normocephalic, atraumatic.  No labored breathing.  Speech is clear and coherent with logical content.  Patient is alert and oriented at baseline.    Assessment and Plan:  Joanna Borawski in today with chief complaint of No chief complaint on file.   1. UTI symptoms Take medication as prescribe Cotton underwear Take shower not bath Cranberry juice, yogurt Force fluids AZO over the counter X2 days RTO prn  Meds ordered this encounter  Medications   cephALEXin (KEFLEX) 500 MG capsule    Sig: Take 1 capsule (500 mg total) by mouth 2 (two) times daily.    Dispense:  14 capsule    Refill:  0    Order Specific Question:   Supervising Provider    Answer:   [name] [id number]     Follow Up Instructions: I discussed the assessment and treatment plan with the patient. The patient was provided an opportunity to ask questions and all were answered. The patient agreed with the plan and demonstrated an understanding of the instructions.  A copy of instructions were sent to the patient via MyChart.  The patient was advised to call  back or seek an in-person evaluation if the symptoms worsen or if the condition fails to improve as anticipated.  Time:  I spent 11 minutes with the patient via telehealth technology discussing the above problems/concerns.    Frances Daphine Deutscher, FNP

## 2022-07-13 ENCOUNTER — Ambulatory Visit
Admission: EM | Admit: 2022-07-13 | Discharge: 2022-07-13 | Disposition: A | Discharge status: Home / Self Care | Payer: Medicaid Other | Attending: Internal Medicine | Admitting: Internal Medicine

## 2022-07-13 DIAGNOSIS — Z3202 Encounter for pregnancy test, result negative: Secondary | ICD-10-CM | POA: Diagnosis not present

## 2022-07-13 DIAGNOSIS — Z202 Contact with and (suspected) exposure to infections with a predominantly sexual mode of transmission: Secondary | ICD-10-CM | POA: Insufficient documentation

## 2022-07-13 DIAGNOSIS — N898 Other specified noninflammatory disorders of vagina: Secondary | ICD-10-CM | POA: Insufficient documentation

## 2022-07-13 DIAGNOSIS — Z113 Encounter for screening for infections with a predominantly sexual mode of transmission: Secondary | ICD-10-CM | POA: Diagnosis not present

## 2022-07-13 LAB — POCT URINE PREGNANCY: Preg Test, Ur: NEGATIVE

## 2022-07-13 MED ORDER — METRONIDAZOLE 500 MG PO TABS: 500.0000 mg | ORAL_TABLET | Freq: Two times a day (BID) | ORAL | 0 refills | Status: DC

## 2022-07-13 NOTE — Discharge Instructions (Signed)
Your pregnancy test was negative.  I am sending you metronidazole antibiotic due to trichomonas exposure.  Your vaginal swab is pending.  Will call if anything is positive and treat as appropriate.  Please refrain from sexual activity until test results and treatment are complete.

## 2022-07-13 NOTE — ED Provider Notes (Signed)
[name]    CSN: [id number] Arrival date & time: 07/13/22  1305      History   Chief Complaint Chief Complaint  Patient presents with   SEXUALLY TRANSMITTED DISEASE    I would like to checked for any stds - Entered by patient    HPI Frances [name] is a 26 y.o. female.   Patient presents with white vaginal discharge that started about 3 days ago.  She denies any associated dysuria, urinary frequency, pelvic pain, abdominal pain, hematuria, back pain, fever.  She reports that her recent sexual partner told her that they tested positive for trichomonas.  She also reports that she was treated by e-visit with cephalexin antibiotic due to concern for urinary tract infection 06/29/2022.  She was having urinary frequency at that time that resolved with antibiotic treatment.  Last menstrual cycle was 06/29/2022 and patient does not use any form of birth control.     Past Medical History:  Diagnosis Date   Anemia    on Iron supplements   Asthma 1999   as a child; no current problems   Chronic kidney disease    as a child   Constipation    Gall stones    Gallstones 10/23/2016   10/2016: Seen by [facility], pt states, and plans for PP follow up. Advised her on low fat diet   Seizures (HCC)    seizure disorder as a child, treated with medication; none since    Patient Active Problem List   Diagnosis Date Noted   Poor fetal growth 02/23/2021   Vaginal delivery 02/23/2021   Maternal care for poor fetal growth 01/17/2021   COVID-19 09/06/2020   Supervision of high risk pregnancy, antepartum 08/08/2020   Lesion of skin of scalp 01/03/2020   Breast nodule 06/07/2019   History of asthma 08/18/2018   History of seizure disorder 08/18/2018   History of kidney disease as a child 08/18/2018   History of recurrent UTIs 09/16/2016    Past Surgical History:  Procedure Laterality Date   BACK SURGERY     IRRIGATION AND DEBRIDEMENT ABSCESS Left 06/11/2017   Procedure:  IRRIGATION, DRAINAGE AND DEBRIDEMENT LEFT FLANK NECROTIZING SOFT TISSUE INFECTION;  Surgeon: Avel Peace, MD;  Location: [facility];  Service: General;  Laterality: Left;   TYMPANOSTOMY TUBE PLACEMENT     WOUND DEBRIDEMENT Left 06/12/2017   Procedure: DRESSING CHANGE AND DEBRIDEMENT OF WOUND;  Surgeon: Avel Peace, MD;  Location: [facility];  Service: General;  Laterality: Left;    OB History     Gravida  3   Para  3   Term  3   Preterm  0   AB  0   Living  3      SAB  0   IAB  0   Ectopic  0   Multiple  0   Live Births  3            Home Medications    Prior to Admission medications   Medication Sig Start Date End Date Taking? Authorizing Provider  metroNIDAZOLE (FLAGYL) 500 MG tablet Take 1 tablet (500 mg total) by mouth 2 (two) times daily. 07/13/22  Yes Ammanda Dobbins, Rolly Salter E, FNP  cephALEXin (KEFLEX) 500 MG capsule Take 1 capsule (500 mg total) by mouth 2 (two) times daily. 06/29/22   Daphine Deutscher, Mary-Margaret, FNP  ibuprofen (ADVIL) 600 MG tablet Take 1 tablet (600 mg total) by mouth every 6 (six) hours. 02/25/21   Arabella Merles, CNM  methocarbamol (ROBAXIN) 500 MG tablet Take 1 tablet (500 mg total) by mouth every 8 (eight) hours as needed for muscle spasms. 03/31/22   Petrucelli, Samantha R, PA-C  naproxen (NAPROSYN) 500 MG tablet Take 1 tablet (500 mg total) by mouth 2 (two) times daily as needed for moderate pain. 03/31/22   Petrucelli, Pleas Koch, PA-C  nitrofurantoin, macrocrystal-monohydrate, (MACROBID) 100 MG capsule Take 1 capsule (100 mg total) by mouth 2 (two) times daily. 02/20/22   Jone Baseman, NP  ondansetron (ZOFRAN-ODT) 4 MG disintegrating tablet Take 1 tablet (4 mg total) by mouth every 8 (eight) hours as needed for nausea or vomiting. 05/23/21   Gustavus Bryant, FNP  oseltamivir (TAMIFLU) 75 MG capsule Take 1 capsule (75 mg total) by mouth every 12 (twelve) hours. 07/26/21   Tomi Bamberger, PA-C  Prenatal Vit-Fe Fumarate-FA (PRENATAL MULTIVITAMIN) TABS  tablet Take 1 tablet by mouth daily at 12 noon.    [provider]  omeprazole (PRILOSEC) 20 MG capsule Take 1 capsule (20 mg total) by mouth daily. Patient not taking: No sig reported 04/16/20 09/28/20  Belinda Fisher, PA-C    Family History Family History  Problem Relation Age of Onset   Asthma Mother    Hypertension Mother    Asthma Sister    Birth defects Sister        Down's syndrome   Intellectual disability Father     Social History Social History   Tobacco Use   Smoking status: Never   Smokeless tobacco: Never  Vaping Use   Vaping Use: Never used  Substance Use Topics   Alcohol use: No   Drug use: Not Currently    Frequency: 2.0 times per week    Types: Marijuana    Comment: last used 05/2018     Allergies   Vancomycin   Review of Systems Review of Systems Per HPI  Physical Exam Triage Vital Signs ED Triage Vitals  Enc Vitals Group     BP 07/13/22 1452 110/77     Pulse Rate 07/13/22 1452 90     Resp 07/13/22 1452 16     Temp 07/13/22 1452 98.6 F (37 C)     Temp Source 07/13/22 1452 Oral     SpO2 07/13/22 1452 100 %     Weight --      Height --      Head Circumference --      Peak Flow --      Pain Score 07/13/22 1453 0     Pain Loc --      Pain Edu? --      Excl. in GC? --    No data found.  Updated Vital Signs BP 110/77 (BP Location: Left Arm)   Pulse 90   Temp 98.6 F (37 C) (Oral)   Resp 16   SpO2 100%   Breastfeeding No   Visual Acuity Right Eye Distance:   Left Eye Distance:   Bilateral Distance:    Right Eye Near:   Left Eye Near:    Bilateral Near:     Physical Exam Constitutional:      General: She is not in acute distress.    Appearance: Normal appearance. She is not toxic-appearing or diaphoretic.  HENT:     Head: Normocephalic and atraumatic.  Eyes:     Extraocular Movements: Extraocular movements intact.     Conjunctiva/sclera: Conjunctivae normal.  Cardiovascular:     Rate and Rhythm: Normal rate and  regular  rhythm.     Pulses: Normal pulses.     Heart sounds: Normal heart sounds.  Pulmonary:     Effort: Pulmonary effort is normal. No respiratory distress.     Breath sounds: Normal breath sounds.  Abdominal:     General: Bowel sounds are normal. There is no distension.     Palpations: Abdomen is soft.     Tenderness: There is no abdominal tenderness.  Genitourinary:    Comments: Deferred with shared decision making.  Self swab performed. Neurological:     General: No focal deficit present.     Mental Status: She is alert and oriented to person, place, and time. Mental status is at baseline.  Psychiatric:        Mood and Affect: Mood normal.        Behavior: Behavior normal.        Thought Content: Thought content normal.        Judgment: Judgment normal.      UC Treatments / Results  Labs (all labs ordered are listed, but only abnormal results are displayed) Labs Reviewed  POCT URINE PREGNANCY  CERVICOVAGINAL ANCILLARY ONLY    EKG   Radiology No results found.  Procedures Procedures (including critical care time)  Medications Ordered in UC Medications - No data to display  Initial Impression / Assessment and Plan / UC Course  I have reviewed the triage vital signs and the nursing notes.  Pertinent labs & imaging results that were available during my care of the patient were reviewed by me and considered in my medical decision making (see chart for details).     Given patient's report of trichomoniasis exposure, will opt to treat prophylactically with metronidazole antibiotic.  Advised patient to avoid alcohol while taking antibiotic.  I am also suspicious that patient could have antibiotic induced yeast infection.  Cervicovaginal swab pending.  Will await results for any further treatment.  Patient advised to refrain from sexual activity until test results and treatment are complete.  UA deferred given no associated urinary symptoms.  Urine pregnancy negative.   Discussed return precautions.  Patient verbalized understanding and was agreeable with plan. Final Clinical Impressions(s) / UC Diagnoses   Final diagnoses:  Trichomonas exposure  Vaginal discharge  Screening examination for venereal disease  Urine pregnancy test negative     Discharge Instructions      Your pregnancy test was negative.  I am sending you metronidazole antibiotic due to trichomonas exposure.  Your vaginal swab is pending.  Will call if anything is positive and treat as appropriate.  Please refrain from sexual activity until test results and treatment are complete.    ED Prescriptions     Medication Sig Dispense Auth. Provider   metroNIDAZOLE (FLAGYL) 500 MG tablet Take 1 tablet (500 mg total) by mouth 2 (two) times daily. 14 tablet [place], Acie Fredrickson, [place]      PDMP not reviewed this encounter.   Gustavus Bryant, [place] 07/13/22 [phone]

## 2022-07-13 NOTE — ED Triage Notes (Signed)
Pt c/o pelvic pain, vaginal discharge. Was recently treated w/ abx for UTI.

## 2022-07-15 LAB — CERVICOVAGINAL ANCILLARY ONLY
Bacterial Vaginitis (gardnerella): POSITIVE — AB
Candida Glabrata: NEGATIVE
Candida Vaginitis: NEGATIVE
Chlamydia: NEGATIVE
Comment: NEGATIVE
Comment: NEGATIVE
Comment: NEGATIVE
Comment: NEGATIVE
Comment: NEGATIVE
Comment: NORMAL
Neisseria Gonorrhea: NEGATIVE
Trichomonas: NEGATIVE
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
## 2022-07-18 ENCOUNTER — Encounter: Payer: Self-pay | Admitting: Student

## 2022-07-18 ENCOUNTER — Ambulatory Visit (INDEPENDENT_AMBULATORY_CARE_PROVIDER_SITE_OTHER): Payer: Medicaid Other | Admitting: Student

## 2022-07-18 VITALS — BP 105/60 | HR 85 | Temp 99.0°F | Ht 67.0 in | Wt 184.2 lb

## 2022-07-18 DIAGNOSIS — R109 Unspecified abdominal pain: Secondary | ICD-10-CM

## 2022-07-18 DIAGNOSIS — N6452 Nipple discharge: Secondary | ICD-10-CM

## 2022-07-18 DIAGNOSIS — R6889 Other general symptoms and signs: Secondary | ICD-10-CM

## 2022-07-18 DIAGNOSIS — N643 Galactorrhea not associated with childbirth: Secondary | ICD-10-CM

## 2022-07-18 DIAGNOSIS — Z0000 Encounter for general adult medical examination without abnormal findings: Secondary | ICD-10-CM

## 2022-07-18 MED ORDER — FLUTICASONE PROPIONATE 50 MCG/ACT NA SUSP: 1.0000 | Freq: Every day | NASAL | 6 refills | Status: DC

## 2022-07-18 NOTE — Patient Instructions (Signed)
It was great to see you! Thank you for allowing me to participate in your care!   Our plans for today:  - Please return if your stomach pains become worse or more frequent. They do sound like gas pains - I sent in a prescription for Flonase to your pharmacy   We are checking some labs today, I will call you if they are abnormal will send you a MyChart message or a letter if they are normal.  If you do not hear about your labs in the next 2 weeks please let us know.  Take care and seek immediate care sooner if you develop any concerns.   Dr. Erick Alley, DO [facility]

## 2022-07-19 ENCOUNTER — Telehealth: Payer: Self-pay | Admitting: Student

## 2022-07-19 DIAGNOSIS — N6452 Nipple discharge: Secondary | ICD-10-CM | POA: Insufficient documentation

## 2022-07-19 DIAGNOSIS — R109 Unspecified abdominal pain: Secondary | ICD-10-CM | POA: Insufficient documentation

## 2022-07-19 DIAGNOSIS — R6889 Other general symptoms and signs: Secondary | ICD-10-CM | POA: Insufficient documentation

## 2022-07-19 DIAGNOSIS — N643 Galactorrhea not associated with childbirth: Secondary | ICD-10-CM | POA: Insufficient documentation

## 2022-07-19 LAB — TSH: TSH: 1.1 u[IU]/mL (ref 0.450–4.500)

## 2022-07-19 LAB — PROLACTIN: Prolactin: 38.3 ng/mL — ABNORMAL HIGH (ref 4.8–23.3)

## 2022-07-19 NOTE — Assessment & Plan Note (Signed)
Very nonspecific however physical exam is very reassuring is also reassuring that pain only lasts for couple seconds and completely resolves and only occurs once a week.  Could be due to gas.  Patient advised to monitor her symptoms and return if pain becomes more persistent or other symptoms develop.

## 2022-07-19 NOTE — Telephone Encounter (Signed)
Called pt to discuss slightly elevated prolactin level which could be causing her galactorrhea.  Patient agrees to return to our office for a fasting prolactin level as certain foods can slightly increase prolactin.  She is not currently taking any medications that could explain prolactinemia. If the fasting prolactin level is elevated, will proceed with brain MRI to rule out pituitary lesion.  Patient currently denies any vision changes.  Does admit to occasional headaches which are mild and relieved with Tylenol.  Denies any vomiting or nausea.

## 2022-07-19 NOTE — Assessment & Plan Note (Signed)
Breast milk production production meets criteria for galactorrhea as patient never breast-fed her last child who was born a year and a half ago yet she is still producing breastmilk from both breasts.  As it is bilateral, I am concerned for pituitary lesion.  Will obtain prolactin and TSH levels.  If indicated can proceed further with brain MRI.

## 2022-07-19 NOTE — Assessment & Plan Note (Signed)
Will do trial of Flonase to clear congestion.  If still present in a month, can try Zyrtec.

## 2022-07-22 ENCOUNTER — Other Ambulatory Visit: Payer: Medicaid Other

## 2022-07-22 ENCOUNTER — Encounter: Payer: Self-pay | Admitting: Student

## 2022-07-22 DIAGNOSIS — N643 Galactorrhea not associated with childbirth: Secondary | ICD-10-CM | POA: Diagnosis not present

## 2022-07-23 LAB — PROLACTIN: Prolactin: 13 ng/mL (ref 4.8–23.3)

## 2022-07-26 ENCOUNTER — Encounter: Payer: Self-pay | Admitting: Student

## 2022-07-26 ENCOUNTER — Ambulatory Visit (INDEPENDENT_AMBULATORY_CARE_PROVIDER_SITE_OTHER): Payer: Medicaid Other | Admitting: Student

## 2022-07-26 VITALS — BP 117/80 | HR 88 | Ht 67.0 in | Wt 182.6 lb

## 2022-07-26 DIAGNOSIS — H938X3 Other specified disorders of ear, bilateral: Secondary | ICD-10-CM | POA: Diagnosis not present

## 2022-07-26 DIAGNOSIS — N643 Galactorrhea not associated with childbirth: Secondary | ICD-10-CM | POA: Diagnosis not present

## 2022-07-26 NOTE — Patient Instructions (Signed)
It was great to see you! Thank you for allowing me to participate in your care!  I recommend that you always bring your medications to each appointment as this makes it easy to ensure you are on the correct medications and helps [place] not miss when refills are needed.  Our plans for today:  - Increase flonase to 2 sprays in each nostril daily. Try the valsalva maneuver ("popping your ears") multiple times a day to open your ears.  - Let me know if you decide you wants medication to stop lactating.   Take care and seek immediate care sooner if you develop any concerns.   Dr. Erick Alley, DO [facility]

## 2022-07-26 NOTE — Progress Notes (Signed)
    SUBJECTIVE:   CHIEF COMPLAINT / HPI:   Ear pressure Feels like ears are clogged for past 5 days. No headache. Did have rhinorrhea and was sneezing about 4-5 days ago but that subsided. No changes in hearing. Is using Flonase.   Galactorrhea Has been lactating for past year and a half after having last child although she did not breast-feed the child.  Pt notices lactation mostly after a hot shower. Checks to see if she is lactating with her hands on occasion, not every day.    PERTINENT  PMH / PSH: none  OBJECTIVE:   BP 117/80   Pulse 88   Ht 5\' 7"  (1.702 m)   Wt 182 lb 9.6 oz (82.8 kg)   LMP 06/29/2022 (Exact Date)   SpO2 100%   BMI 28.60 kg/m    General: NAD, pleasant, able to participate in exam Ears: Bilateral TMs pearly gray with cone light present and no bulging Cardiac: Well-perfused Respiratory: Breathing comfortably on room air Neuro: alert, no obvious focal deficits Psych: Normal affect and mood  ASSESSMENT/PLAN:   Galactorrhea  Liver and kidney function normal on previous CMP. Prolactin level WNL and patient is taking no medications that would cause galactorrhea. This is likely idiopathic and will resolve on its own. Patient showed interest in wanting to try cabbage leaves. She will let me know if this helps. She does not want any medications to stop the lactating at this time   Ear pressure, bilateral Likely due to eustachian tube dysfunction causing fluid accumulation behind TMs.  Advised patient to increase Flonase to 2 sprays in each nostril daily and do Valsalva maneuver multiple times a day to see if this helps.   Dr. 07/01/2022, DO [facility]

## 2022-07-26 NOTE — Assessment & Plan Note (Signed)
Liver and kidney function normal on previous CMP.  Prolactin level WNL and patient is taking no medications that would cause galactorrhea.  This is likely idiopathic and will resolve on its own.  Patient showed interest in wanting to try cabbage leaves.  She will let me know if this helps.  She does not want any medications to stop the lactating at this time.

## 2022-07-26 NOTE — Assessment & Plan Note (Signed)
Likely due to eustachian tube dysfunction causing fluid accumulation behind TMs.  Advised patient to increase Flonase to 2 sprays in each nostril daily and do Valsalva maneuver multiple times a day to see if this helps.

## 2022-08-09 ENCOUNTER — Emergency Department (HOSPITAL_BASED_OUTPATIENT_CLINIC_OR_DEPARTMENT_OTHER): Payer: Medicaid Other | Admitting: Radiology

## 2022-08-09 ENCOUNTER — Other Ambulatory Visit: Payer: Self-pay

## 2022-08-09 ENCOUNTER — Emergency Department (HOSPITAL_BASED_OUTPATIENT_CLINIC_OR_DEPARTMENT_OTHER)
Admission: EM | Admit: 2022-08-09 | Discharge: 2022-08-09 | Disposition: A | Discharge status: Home / Self Care | Payer: Medicaid Other | Attending: Emergency Medicine | Admitting: Emergency Medicine

## 2022-08-09 ENCOUNTER — Encounter (HOSPITAL_BASED_OUTPATIENT_CLINIC_OR_DEPARTMENT_OTHER): Payer: Self-pay | Admitting: Emergency Medicine

## 2022-08-09 DIAGNOSIS — N189 Chronic kidney disease, unspecified: Secondary | ICD-10-CM | POA: Diagnosis not present

## 2022-08-09 DIAGNOSIS — J45909 Unspecified asthma, uncomplicated: Secondary | ICD-10-CM | POA: Diagnosis not present

## 2022-08-09 DIAGNOSIS — R0789 Other chest pain: Secondary | ICD-10-CM | POA: Diagnosis not present

## 2022-08-09 DIAGNOSIS — R079 Chest pain, unspecified: Secondary | ICD-10-CM

## 2022-08-09 LAB — BASIC METABOLIC PANEL
Anion gap: 6 (ref 5–15)
BUN: 13 mg/dL (ref 6–20)
CO2: 24 mmol/L (ref 22–32)
Calcium: 9.3 mg/dL (ref 8.9–10.3)
Chloride: 109 mmol/L (ref 98–111)
Creatinine, Ser: 0.48 mg/dL (ref 0.44–1.00)
GFR, Estimated: >60 mL/min (ref >60)
Glucose, Bld: 107 mg/dL — ABNORMAL HIGH (ref 70–99)
Potassium: 3.8 mmol/L (ref 3.5–5.1)
Sodium: 139 mmol/L (ref 135–145)

## 2022-08-09 LAB — CBC
HCT: 31.8 % — ABNORMAL LOW (ref 36.0–46.0)
Hemoglobin: 10.3 g/dL — ABNORMAL LOW (ref 12.0–15.0)
MCH: 28.6 pg (ref 26.0–34.0)
MCHC: 32.4 g/dL (ref 30.0–36.0)
MCV: 88.3 fL (ref 80.0–100.0)
Platelets: 303 10*3/uL (ref 150–400)
RBC: 3.6 MIL/uL — ABNORMAL LOW (ref 3.87–5.11)
RDW: 12.5 % (ref 11.5–15.5)
WBC: 3.6 10*3/uL — ABNORMAL LOW (ref 4.0–10.5)
nRBC: 0 % (ref 0.0–0.2)

## 2022-08-09 LAB — PREGNANCY, URINE: Preg Test, Ur: NEGATIVE

## 2022-08-09 LAB — TROPONIN I (HIGH SENSITIVITY): Troponin I (High Sensitivity): <2 ng/L (ref <18)

## 2022-08-09 MED ORDER — METHOCARBAMOL 500 MG PO TABS
500.0000 mg | ORAL_TABLET | Freq: Three times a day (TID) | ORAL | 0 refills | Status: DC | PRN
Start: 2022-08-09 — End: 2023-01-27

## 2022-08-09 NOTE — ED Provider Notes (Signed)
[facility] EMERGENCY DEPT Provider Note   CSN: [id number] Arrival date & time: 08/09/22  0459     History  Chief Complaint  Patient presents with   Chest Pain    Frances [name] is a 26 y.o. female.   Chest Pain Patient presents left-sided chest pain.  Sharp.  Left chest.  Began this morning and woke her out of sleep.  Does not really feel short of breath.  No cough.  No nausea vomiting.  No fevers.  No injury yesterday.  Does not smoke.  No recent travel.  No swelling in her legs.    Past Medical History:  Diagnosis Date   Anemia    on Iron supplements   Asthma 1999   as a child; no current problems   Chronic kidney disease    as a child   Constipation    Gall stones    Gallstones 10/23/2016   10/2016: Seen by [facility], pt states, and plans for PP follow up. Advised her on low fat diet   Seizures (HCC)    seizure disorder as a child, treated with medication; none since    Home Medications Prior to Admission medications   Medication Sig Start Date End Date Taking? Authorizing Provider  cephALEXin (KEFLEX) 500 MG capsule Take 1 capsule (500 mg total) by mouth 2 (two) times daily. Patient not taking: Reported on 07/18/2022 06/29/22   [name], FNP  fluticasone (FLONASE) 50 MCG/ACT nasal spray Place 1 spray into both nostrils daily. 07/18/22   Erick Alley, DO  ibuprofen (ADVIL) 600 MG tablet Take 1 tablet (600 mg total) by mouth every 6 (six) hours. Patient not taking: Reported on 07/18/2022 02/25/21   Arabella Merles, CNM  methocarbamol (ROBAXIN) 500 MG tablet Take 1 tablet (500 mg total) by mouth every 8 (eight) hours as needed for muscle spasms. 08/09/22   Benjiman Core, MD  metroNIDAZOLE (FLAGYL) 500 MG tablet Take 1 tablet (500 mg total) by mouth 2 (two) times daily. 07/13/22   Gustavus Bryant, FNP  naproxen (NAPROSYN) 500 MG tablet Take 1 tablet (500 mg total) by mouth 2 (two) times daily as needed for moderate pain. Patient not taking:  Reported on 07/18/2022 03/31/22   Petrucelli, Pleas Koch, PA-C  nitrofurantoin, macrocrystal-monohydrate, (MACROBID) 100 MG capsule Take 1 capsule (100 mg total) by mouth 2 (two) times daily. Patient not taking: Reported on 07/18/2022 02/20/22   Jone Baseman, NP  ondansetron (ZOFRAN-ODT) 4 MG disintegrating tablet Take 1 tablet (4 mg total) by mouth every 8 (eight) hours as needed for nausea or vomiting. Patient not taking: Reported on 07/18/2022 05/23/21   Gustavus Bryant, FNP  oseltamivir (TAMIFLU) 75 MG capsule Take 1 capsule (75 mg total) by mouth every 12 (twelve) hours. Patient not taking: Reported on 07/18/2022 07/26/21   Tomi Bamberger, PA-C  Prenatal Vit-Fe Fumarate-FA (PRENATAL MULTIVITAMIN) TABS tablet Take 1 tablet by mouth daily at 12 noon. Patient not taking: Reported on 07/18/2022    [provider]  omeprazole (PRILOSEC) 20 MG capsule Take 1 capsule (20 mg total) by mouth daily. Patient not taking: No sig reported 04/16/20 09/28/20  Belinda Fisher, PA-C      Allergies    Vancomycin    Review of Systems   Review of Systems  Cardiovascular:  Positive for chest pain.    Physical Exam Updated Vital Signs BP 103/82 (BP Location: Right Arm)   Pulse 82   Temp 98.8 F (37.1 C)   Resp  12   Ht 5\' 7"  (1.702 m)   Wt 82.6 kg   LMP 07/29/2022 (Exact Date)   SpO2 100%   BMI 28.51 kg/m  Physical Exam Vitals and nursing note reviewed.  Constitutional:      Appearance: She is well-developed.  Cardiovascular:     Rate and Rhythm: Regular rhythm.  Chest:     Comments: Point tenderness left lateral lower chest wall.  No rash.  No crepitance. Musculoskeletal:     Right lower leg: No edema.     Left lower leg: No edema.  Skin:    General: Skin is warm.  Neurological:     Mental Status: She is alert.     ED Results / Procedures / Treatments   Labs (all labs ordered are listed, but only abnormal results are displayed) Labs Reviewed  BASIC METABOLIC PANEL - Abnormal;  Notable for the following components:      Result Value   Glucose, Bld 107 (*)    All other components within normal limits  CBC - Abnormal; Notable for the following components:   WBC 3.6 (*)    RBC 3.60 (*)    Hemoglobin 10.3 (*)    HCT 31.8 (*)    All other components within normal limits  PREGNANCY, URINE  TROPONIN I (HIGH SENSITIVITY)  TROPONIN I (HIGH SENSITIVITY)    EKG EKG Interpretation  Date/Time:  Friday August 09 2022 05:29:37 EST Ventricular Rate:  77 PR Interval:  148 QRS Duration: 90 QT Interval:  373 QTC Calculation: 423 R Axis:   47 Text Interpretation: Sinus rhythm Low voltage, precordial leads No significant change was found Confirmed by [id number] (Paula Libra) on 08/09/2022 6:06:49 AM  Radiology DG Chest 2 View  Result Date: 08/09/2022 CLINICAL DATA:  26 year old female with history of left-sided chest pain. EXAM: CHEST - 2 VIEW COMPARISON:  Chest x-ray 01/17/2022. FINDINGS: Lung volumes are normal. No consolidative airspace disease. No pleural effusions. No pneumothorax. No pulmonary nodule or mass noted. Pulmonary vasculature and the cardiomediastinal silhouette are within normal limits. IMPRESSION: No radiographic evidence of acute cardiopulmonary disease. Electronically Signed   By: 03/19/2022 M.D.   On: 08/09/2022 06:04    Procedures Procedures    Medications Ordered in ED Medications - No data to display  ED Course/ Medical Decision Making/ A&P                           Medical Decision Making Amount and/or Complexity of Data Reviewed Labs: ordered. Radiology: ordered.  Risk Prescription drug management.   Patient with point tenderness to left lower anterior chest wall.  Reproducible.  EKG reassuring.  Chest x-ray reassuring.  Blood work also reassuring.  No trauma.  Low risk for pulmonary embolism.  I think this is likely chest wall pain.  Will treat symptomatically.  Appears stable for discharge home.        Final Clinical  Impression(s) / ED Diagnoses Final diagnoses:  Nonspecific chest pain    Rx / DC Orders ED Discharge Orders          Ordered    methocarbamol (ROBAXIN) 500 MG tablet  Every 8 hours PRN        08/09/22 0720              08/11/22, MD 08/09/22 [phone]

## 2022-08-09 NOTE — ED Notes (Signed)
Patient verbalizes understanding of discharge instructions. Opportunity for questioning and answers were provided. Patient discharged from ED.  °

## 2022-08-09 NOTE — ED Triage Notes (Signed)
Pt c/o left sided chest pain under her breast that woke her up out of her sleep.

## 2022-09-16 ENCOUNTER — Encounter: Payer: Self-pay | Admitting: Student

## 2022-10-16 ENCOUNTER — Encounter: Payer: Self-pay | Admitting: Student

## 2022-11-18 ENCOUNTER — Ambulatory Visit (INDEPENDENT_AMBULATORY_CARE_PROVIDER_SITE_OTHER): Payer: Medicaid Other | Admitting: Student

## 2022-11-18 ENCOUNTER — Encounter: Payer: Self-pay | Admitting: Student

## 2022-11-18 ENCOUNTER — Other Ambulatory Visit: Payer: Self-pay

## 2022-11-18 VITALS — BP 126/87 | HR 94 | Ht 67.0 in | Wt 188.6 lb

## 2022-11-18 DIAGNOSIS — D649 Anemia, unspecified: Secondary | ICD-10-CM

## 2022-11-18 DIAGNOSIS — R519 Headache, unspecified: Secondary | ICD-10-CM

## 2022-11-18 NOTE — Patient Instructions (Signed)
It was great to see you! Thank you for allowing me to participate in your care!   Our plans for today:  - For your headaches you can continue taking Tylenol up to 1000 mg every 6 hours as needed and/or Ibuprofen 400-600 mg every 6 hours as needed -I recommend 7-8 full hours of sleep every night along with eating healthy and staying hydrated with about 2 L water per day -We will check some labs to look at your iron levels and will start you on an iron supplement if needed  -Return for a pap smear in the next month or two   We are checking some labs today, I will call you if they are abnormal will send you a MyChart message or a letter if they are normal.  If you do not hear about your labs in the next 2 weeks please let us know.  Take care and seek immediate care sooner if you develop any concerns.   Dr. Erick Alley, DO [facility]

## 2022-11-18 NOTE — Progress Notes (Unsigned)
    SUBJECTIVE:   CHIEF COMPLAINT / HPI:   Headaches Unsure if they are triggered by anything, they occur randomly at any time of day. Located at the back of her head, feels like her head is pounding, they are not worsened by light or sound, no auras prior to headache. No nausea, vomiting, vision changes or dizziness. She takes tylenol which helps but sometimes they come back. They can last from 5-10 minutes and sometimes resolve on their own and she has them every other day, sometimes more than one in a day.   Sleep: 9:30-5:50, no prolonged night time awakenings  Hydration: drinks water throughout the day - about 2 L/day  Stress: recently had an episode she thinks was ans anxiety attack which resolved. Hasn't had one since. Does admit to feel stressed out on a regular basis d/t her 3 children and balancing life and work. When she is feeling anxious she likes to take walks and take deep breaths. She does think anxiety could be a contributing factor to her headaches but feels her anxiety is currently under control and does not want to start medication/therapy at this time.  Of note, galactorrhea has resolved.  See previous notes for details.  Anemia  Hemoglobin 10.3 08/09/2022 and has been persistently low over the last couple years with normal MCV.  Was taking iron supplement during her last pregnancy (2022) but none since Periods occur monthly lasting 5-7 days. Think she has a normal flow changing pads every 3-4 hours but she has noticed more clots in last few periods.   PMHx: Anemia  OBJECTIVE:   BP 126/87   Pulse 94   Ht 5\' 7"  (1.702 m)   Wt 188 lb 9.6 oz (85.5 kg)   SpO2 100%   BMI 29.54 kg/m    General: NAD, pleasant, able to participate in exam Cardiac: Well-perfused Respiratory: Breathing comfortably on room air Neuro: Cranial nerves II through XII intact, sensation intact, finger-nose-finger test normal Psych: Normal affect and mood  ASSESSMENT/PLAN:   Nonintractable  headache Patient has no red flags including nausea, vomiting or vision changes.  Neuro exam is reassuring, also reassuring that her headaches resolve sometimes even without medication and typically last no more than 10 minutes.  Per chart review patient has history of anemia which we will workup further as this could be a cause of her headaches.  We also explored the idea that headaches are stress-induced.  Patient advised to return if she feels her anxiety is not under control and she would like help managing this.  She can continue taking other Tylenol or ibuprofen for headaches if needed.   Of note, patient does have a history of galactorrhea which was worked up previously with an elevated prolactin however repeat prolactin is WNL.  I do not feel patient needs head imaging at this time especially as galactorrhea resolved and TSH was WNL and she has regular periods.  Pituitary lesion is unlikely.  Anemia Will obtain CBC, iron, ferritin and TIBC levels.  Will start iron supplementation if indicated.   Health maintenance Patient to return for Pap smear in near future  Dr. Erick Alley, DO [facility]

## 2022-11-19 LAB — CBC
Hematocrit: 34.9 % (ref 34.0–46.6)
Hemoglobin: 11.3 g/dL (ref 11.1–15.9)
MCH: 28.8 pg (ref 26.6–33.0)
MCHC: 32.4 g/dL (ref 31.5–35.7)
MCV: 89 fL (ref 79–97)
Platelets: 322 10*3/uL (ref 150–450)
RBC: 3.93 x10E6/uL (ref 3.77–5.28)
RDW: 12.5 % (ref 11.7–15.4)
WBC: 5.1 10*3/uL (ref 3.4–10.8)

## 2022-11-19 LAB — IRON,TIBC AND FERRITIN PANEL
Ferritin: 10 ng/mL — ABNORMAL LOW (ref 15–150)
Iron Saturation: 13 % — ABNORMAL LOW (ref 15–55)
Iron: 56 ug/dL (ref 27–159)
Total Iron Binding Capacity: 429 ug/dL (ref 250–450)
UIBC: 373 ug/dL (ref 131–425)

## 2022-11-20 ENCOUNTER — Telehealth: Payer: Self-pay | Admitting: Student

## 2022-11-20 DIAGNOSIS — R519 Headache, unspecified: Secondary | ICD-10-CM | POA: Insufficient documentation

## 2022-11-20 DIAGNOSIS — D508 Other iron deficiency anemias: Secondary | ICD-10-CM

## 2022-11-20 DIAGNOSIS — O99019 Anemia complicating pregnancy, unspecified trimester: Secondary | ICD-10-CM | POA: Insufficient documentation

## 2022-11-20 DIAGNOSIS — D649 Anemia, unspecified: Secondary | ICD-10-CM | POA: Insufficient documentation

## 2022-11-20 MED ORDER — FERROUS SULFATE 325 (65 FE) MG PO TABS: 325.0000 mg | ORAL_TABLET | ORAL | 0 refills | Status: DC

## 2022-11-20 NOTE — Assessment & Plan Note (Signed)
Will obtain CBC, iron, ferritin and TIBC levels.  Will start iron supplementation if indicated.

## 2022-11-20 NOTE — Assessment & Plan Note (Signed)
Patient has no red flags including nausea, vomiting or vision changes.  Neuro exam is reassuring, also reassuring that her headaches resolve sometimes even without medication and typically last no more than 10 minutes.  Per chart review patient has history of anemia which we will workup further as this could be a cause of her headaches.  We also explored the idea that headaches are stress-induced.  Patient advised to return if she feels her anxiety is not under control and she would like help managing this.  She can continue taking other Tylenol or ibuprofen for headaches if needed.   Of note, patient does have a history of galactorrhea which was worked up previously with an elevated prolactin however repeat prolactin is WNL.  I do not feel patient needs head imaging at this time especially as galactorrhea resolved and TSH was WNL and she has regular periods.  Pituitary lesion is unlikely.

## 2022-11-20 NOTE — Telephone Encounter (Signed)
Called to discuss recent iron studies with pt showing slightly decreased iron stores (ferritin 10)  but Hgb improved from previous (10.3>11.3). Discussed she could increase iron in her diet vs take a supplement every other day. Shared decision making used and she would like to start a supplement every other day.  Patient cautioned on dark stools and constipation.  Iron supplement sent to pharmacy.  Will follow-up on headaches when patient returns for pap smear.

## 2022-12-10 ENCOUNTER — Telehealth: Payer: Medicaid Other | Admitting: Physician Assistant

## 2022-12-10 ENCOUNTER — Emergency Department (HOSPITAL_BASED_OUTPATIENT_CLINIC_OR_DEPARTMENT_OTHER)
Admission: EM | Admit: 2022-12-10 | Discharge: 2022-12-10 | Disposition: A | Discharge status: Home / Self Care | Payer: Medicaid Other | Attending: Emergency Medicine | Admitting: Emergency Medicine

## 2022-12-10 ENCOUNTER — Encounter (HOSPITAL_BASED_OUTPATIENT_CLINIC_OR_DEPARTMENT_OTHER): Payer: Self-pay | Admitting: Emergency Medicine

## 2022-12-10 ENCOUNTER — Other Ambulatory Visit: Payer: Self-pay

## 2022-12-10 DIAGNOSIS — R220 Localized swelling, mass and lump, head: Secondary | ICD-10-CM | POA: Diagnosis not present

## 2022-12-10 DIAGNOSIS — R3989 Other symptoms and signs involving the genitourinary system: Secondary | ICD-10-CM

## 2022-12-10 MED ORDER — DIPHENHYDRAMINE HCL 25 MG PO CAPS
25.0000 mg | ORAL_CAPSULE | Freq: Once | ORAL | Status: AC
  Administered 2022-12-10: 25 mg via ORAL
  Filled 2022-12-10: qty 1

## 2022-12-10 MED ORDER — PREDNISONE 50 MG PO TABS
60.0000 mg | ORAL_TABLET | Freq: Once | ORAL | Status: AC
  Administered 2022-12-10: 60 mg via ORAL
  Filled 2022-12-10: qty 1

## 2022-12-10 MED ORDER — NITROFURANTOIN MONOHYD MACRO 100 MG PO CAPS
100.0000 mg | ORAL_CAPSULE | Freq: Two times a day (BID) | ORAL | 0 refills | Status: DC
Start: 2022-12-10 — End: 2023-01-27

## 2022-12-10 MED ORDER — FAMOTIDINE 20 MG PO TABS
20.0000 mg | ORAL_TABLET | Freq: Once | ORAL | Status: AC
  Administered 2022-12-10: 20 mg via ORAL
  Filled 2022-12-10: qty 1

## 2022-12-10 NOTE — ED Provider Notes (Signed)
[name] Provider Note   CSN: [id number] Arrival date & time: 12/10/22  1808     History  Chief Complaint  Patient presents with   Allergic Reaction    Frances [name] is a 27 y.o. female.  Patient is a 27 year old female with no significant past medical history presenting to the emergency department with facial swelling.  Patient states that she woke up normal and then around 10 AM started to notice swelling around her eyes and her cheeks.  She denies any tongue or throat swelling or associated shortness of breath.  She denies any rash or itching, nausea, vomiting or diarrhea.  She states that she has had a similar reaction in the past that was thought to be due to allergies but unsure what was the cause.  She denies any recent new foods, soaps or lotions.  The history is provided by the patient.  Allergic Reaction      Home Medications Prior to Admission medications   Medication Sig Start Date End Date Taking? Authorizing Provider  ferrous sulfate 325 (65 FE) MG tablet Take 1 tablet (325 mg total) by mouth every other day. 11/20/22   Erick Alley, DO  fluticasone (FLONASE) 50 MCG/ACT nasal spray Place 1 spray into both nostrils daily. 07/18/22   Erick Alley, DO  ibuprofen (ADVIL) 600 MG tablet Take 1 tablet (600 mg total) by mouth every 6 (six) hours. Patient not taking: Reported on 07/18/2022 02/25/21   Arabella Merles, CNM  methocarbamol (ROBAXIN) 500 MG tablet Take 1 tablet (500 mg total) by mouth every 8 (eight) hours as needed for muscle spasms. 08/09/22   Benjiman Core, MD  naproxen (NAPROSYN) 500 MG tablet Take 1 tablet (500 mg total) by mouth 2 (two) times daily as needed for moderate pain. Patient not taking: Reported on 07/18/2022 03/31/22   Petrucelli, Pleas Koch, PA-C  nitrofurantoin, macrocrystal-monohydrate, (MACROBID) 100 MG capsule Take 1 capsule (100 mg total) by mouth 2 (two) times daily. 12/10/22   Margaretann Loveless, PA-C  ondansetron (ZOFRAN-ODT) 4 MG disintegrating tablet Take 1 tablet (4 mg total) by mouth every 8 (eight) hours as needed for nausea or vomiting. Patient not taking: Reported on 07/18/2022 05/23/21   Gustavus Bryant, FNP  oseltamivir (TAMIFLU) 75 MG capsule Take 1 capsule (75 mg total) by mouth every 12 (twelve) hours. Patient not taking: Reported on 07/18/2022 07/26/21   Tomi Bamberger, PA-C  omeprazole (PRILOSEC) 20 MG capsule Take 1 capsule (20 mg total) by mouth daily. Patient not taking: No sig reported 04/16/20 09/28/20  Belinda Fisher, PA-C      Allergies    Vancomycin    Review of Systems   Review of Systems  Physical Exam Updated Vital Signs BP 122/80 (BP Location: Right Arm)   Pulse 88   Temp 98.3 F (36.8 C) (Oral)   Resp 15   Ht 5\' 7"  (1.702 m)   Wt 85.7 kg   SpO2 100%   BMI 29.60 kg/m  Physical Exam Vitals and nursing note reviewed.  Constitutional:      General: She is not in acute distress.    Appearance: Normal appearance.  HENT:     Head: Normocephalic and atraumatic.     Nose: Nose normal.     Mouth/Throat:     Mouth: Mucous membranes are moist.     Pharynx: Oropharynx is clear.     Comments: No tongue swelling, no swelling to posterior oropharynx, mild upper  lip swelling Eyes:     Extraocular Movements: Extraocular movements intact.     Conjunctiva/sclera: Conjunctivae normal.     Pupils: Pupils are equal, round, and reactive to light.     Comments: Bilateral periorbital edema  Cardiovascular:     Rate and Rhythm: Normal rate and regular rhythm.     Heart sounds: Normal heart sounds.  Pulmonary:     Effort: Pulmonary effort is normal.     Breath sounds: Normal breath sounds. No stridor. No wheezing.  Abdominal:     General: Abdomen is flat.     Palpations: Abdomen is soft.     Tenderness: There is no abdominal tenderness.  Musculoskeletal:        General: Normal range of motion.     Cervical back: Normal range of motion and neck  supple.  Skin:    General: Skin is warm and dry.     Findings: No rash.  Neurological:     General: No focal deficit present.     Mental Status: She is alert and oriented to person, place, and time.  Psychiatric:        Mood and Affect: Mood normal.        Behavior: Behavior normal.     ED Results / Procedures / Treatments   Labs (all labs ordered are listed, but only abnormal results are displayed) Labs Reviewed - No data to display  EKG None  Radiology No results found.  Procedures Procedures    Medications Ordered in ED Medications  diphenhydrAMINE (BENADRYL) capsule 25 mg (25 mg Oral Given 12/10/22 1844)  predniSONE (DELTASONE) tablet 60 mg (60 mg Oral Given 12/10/22 1844)  famotidine (PEPCID) tablet 20 mg (20 mg Oral Given 12/10/22 1844)    ED Course/ Medical Decision Making/ A&P Clinical Course as of 12/10/22 2019  Tue Dec 10, 2022  2017 Upon reassessment, the patient does have some improvement of her swelling.  She is stable for discharge home with primary care follow-up and was given strict return precautions. [VK]    Clinical Course User Index [VK] Rexford Maus, DO                             Medical Decision Making This patient presents to the ED with chief complaint(s) of facial swelling with no pertinent past medical history which further complicates the presenting complaint. The complaint involves an extensive differential diagnosis and also carries with it a high risk of complications and morbidity.    The differential diagnosis includes allergic reaction, no evidence of anaphylaxis that she is hemodynamically stable with no evidence of airway compromise and no other signs or symptoms of allergic reaction  Additional history obtained: Additional history obtained from N/A Records reviewed Primary Care Documents  ED Course and Reassessment: On patient's arrival to the emergency department she has mild periorbital edema and some mild swelling to  her upper lip, she has no swelling to the oropharynx and no other signs of anaphylaxis on exam.  She will be given Benadryl, Pepcid and prednisone and will be closely reassessed.  Independent labs interpretation:  N/A  Independent visualization of imaging: - N/A  Consultation: - Consulted or discussed management/test interpretation w/ external professional: N/A  Consideration for admission or further workup: Patient has no emergent conditions requiring admission or further work-up at this time and is stable for discharge home with primary care follow-up  Social Determinants of health: N/A  Final Clinical Impression(s) / ED Diagnoses Final diagnoses:  Facial swelling    Rx / DC Orders ED Discharge Orders     None         Rexford Maus, DO 12/10/22 2019

## 2022-12-10 NOTE — Patient Instructions (Signed)
[name], thank you for joining Margaretann Loveless, PA-C for today's virtual visit.  While this provider is not your primary care provider (PCP), if your PCP is located in our provider database this encounter information will be shared with them immediately following your visit.   A [facility] MyChart account gives you access to today's visit and all your visits, tests, and labs performed at [facility] " click here if you don't have a [facility] MyChart account or go to [email or web address]  Consent: (Patient) Frances [name] provided verbal consent for this virtual visit at the beginning of the encounter.  Current Medications:  Current Outpatient Medications:    nitrofurantoin, macrocrystal-monohydrate, (MACROBID) 100 MG capsule, Take 1 capsule (100 mg total) by mouth 2 (two) times daily., Disp: 10 capsule, Rfl: 0   ferrous sulfate 325 (65 FE) MG tablet, Take 1 tablet (325 mg total) by mouth every other day., Disp: 60 tablet, Rfl: 0   fluticasone (FLONASE) 50 MCG/ACT nasal spray, Place 1 spray into both nostrils daily., Disp: 16 g, Rfl: 6   ibuprofen (ADVIL) 600 MG tablet, Take 1 tablet (600 mg total) by mouth every 6 (six) hours. (Patient not taking: Reported on 07/18/2022), Disp: 30 tablet, Rfl: 0   methocarbamol (ROBAXIN) 500 MG tablet, Take 1 tablet (500 mg total) by mouth every 8 (eight) hours as needed for muscle spasms., Disp: 8 tablet, Rfl: 0   naproxen (NAPROSYN) 500 MG tablet, Take 1 tablet (500 mg total) by mouth 2 (two) times daily as needed for moderate pain. (Patient not taking: Reported on 07/18/2022), Disp: 15 tablet, Rfl: 0   ondansetron (ZOFRAN-ODT) 4 MG disintegrating tablet, Take 1 tablet (4 mg total) by mouth every 8 (eight) hours as needed for nausea or vomiting. (Patient not taking: Reported on 07/18/2022), Disp: 20 tablet, Rfl: 0   oseltamivir (TAMIFLU) 75 MG capsule, Take 1 capsule (75 mg total) by mouth every 12 (twelve) hours. (Patient not  taking: Reported on 07/18/2022), Disp: 10 capsule, Rfl: 0   Medications ordered in this encounter:  Meds ordered this encounter  Medications   nitrofurantoin, macrocrystal-monohydrate, (MACROBID) 100 MG capsule    Sig: Take 1 capsule (100 mg total) by mouth 2 (two) times daily.    Dispense:  10 capsule    Refill:  0    Order Specific Question:   Supervising Provider    Answer:   [name] [id number]     *If you need refills on other medications prior to your next appointment, please contact your pharmacy*  Follow-Up: Call back or seek an in-person evaluation if the symptoms worsen or if the condition fails to improve as anticipated.  [facility] [phone]  Other Instructions  Urinary Tract Infection, Adult  A urinary tract infection (UTI) is an infection of any part of the urinary tract. The urinary tract includes the kidneys, ureters, bladder, and urethra. These organs make, store, and get rid of urine in the body. An upper UTI affects the ureters and kidneys. A lower UTI affects the bladder and urethra. What are the causes? Most urinary tract infections are caused by bacteria in your genital area around your urethra, where urine leaves your body. These bacteria grow and cause inflammation of your urinary tract. What increases the risk? You are more likely to develop this condition if: You have a urinary catheter that stays in place. You are not able to control when you urinate or have a bowel movement (incontinence). You are female  and you: Use a spermicide or diaphragm for birth control. Have low estrogen levels. Are pregnant. You have certain genes that increase your risk. You are sexually active. You take antibiotic medicines. You have a condition that causes your flow of urine to slow down, such as: An enlarged prostate, if you are female. Blockage in your urethra. A kidney stone. A nerve condition that affects your bladder control (neurogenic  bladder). Not getting enough to drink, or not urinating often. You have certain medical conditions, such as: Diabetes. A weak disease-fighting system (immunesystem). Sickle cell disease. Gout. Spinal cord injury. What are the signs or symptoms? Symptoms of this condition include: Needing to urinate right away (urgency). Frequent urination. This may include small amounts of urine each time you urinate. Pain or burning with urination. Blood in the urine. Urine that smells bad or unusual. Trouble urinating. Cloudy urine. Vaginal discharge, if you are female. Pain in the abdomen or the lower back. You may also have: Vomiting or a decreased appetite. Confusion. Irritability or tiredness. A fever or chills. Diarrhea. The first symptom in older adults may be confusion. In some cases, they may not have any symptoms until the infection has worsened. How is this diagnosed? This condition is diagnosed based on your medical history and a physical exam. You may also have other tests, including: Urine tests. Blood tests. Tests for STIs (sexually transmitted infections). If you have had more than one UTI, a cystoscopy or imaging studies may be done to determine the cause of the infections. How is this treated? Treatment for this condition includes: Antibiotic medicine. Over-the-counter medicines to treat discomfort. Drinking enough water to stay hydrated. If you have frequent infections or have other conditions such as a kidney stone, you may need to see a health care provider who specializes in the urinary tract (urologist). In rare cases, urinary tract infections can cause sepsis. Sepsis is a life-threatening condition that occurs when the body responds to an infection. Sepsis is treated in the hospital with IV antibiotics, fluids, and other medicines. Follow these instructions at home:  Medicines Take over-the-counter and prescription medicines only as told by your health care  provider. If you were prescribed an antibiotic medicine, take it as told by your health care provider. Do not stop using the antibiotic even if you start to feel better. General instructions Make sure you: Empty your bladder often and completely. Do not hold urine for long periods of time. Empty your bladder after sex. Wipe from front to back after urinating or having a bowel movement if you are female. Use each tissue only one time when you wipe. Drink enough fluid to keep your urine pale yellow. Keep all follow-up visits. This is important. Contact a health care provider if: Your symptoms do not get better after 1-2 days. Your symptoms go away and then return. Get help right away if: You have severe pain in your back or your lower abdomen. You have a fever or chills. You have nausea or vomiting. Summary A urinary tract infection (UTI) is an infection of any part of the urinary tract, which includes the kidneys, ureters, bladder, and urethra. Most urinary tract infections are caused by bacteria in your genital area. Treatment for this condition often includes antibiotic medicines. If you were prescribed an antibiotic medicine, take it as told by your health care provider. Do not stop using the antibiotic even if you start to feel better. Keep all follow-up visits. This is important. This information is not intended  to replace advice given to you by your health care provider. Make sure you discuss any questions you have with your health care provider. Document Revised: 03/10/2020 Document Reviewed: 03/10/2020 Elsevier Patient Education  2023 Elsevier Inc.    If you have been instructed to have an in-person evaluation today at a local Urgent Care facility, please use the link below. It will take you to a list of all of our available [facility], including address, phone number and hours of operation. Please do not delay care.  [facility]  If you or a family  member do not have a primary care provider, use the link below to schedule a visit and establish care. When you choose a [facility] primary care physician or advanced practice provider, you gain a long-term partner in health. Find a Primary Care Provider  Learn more about [facility]'s in-office and virtual care options: [facility] - Get Care Now

## 2022-12-10 NOTE — ED Triage Notes (Signed)
Pt arrives to ED with c/o possible allergic reaction after having facial swelling that started at 10am this morning.

## 2022-12-10 NOTE — ED Notes (Signed)
Writer reassessed patient. Face appears mildly edematous (no more than previously) . Pt reports face feels like it has loosened up some, denies breathing difficulty or difficulty swallowing saliva. No distress noted. MD updated.

## 2022-12-10 NOTE — Discharge Instructions (Signed)
You were seen in the emergency department for your facial swelling.  This is likely due to an allergic reaction and we gave you steroids, Benadryl and Pepcid in the emergency department with some improvement of your swelling.  You can continue to take Benadryl as needed for itching or swelling and you should follow-up with your primary doctor in the next few days to have your symptoms rechecked.  You should return to the emergency department if you are having worsening swelling, swelling to your tongue or your throat, difficulty breathing, repetitive vomiting or any other new or concerning symptoms.

## 2022-12-10 NOTE — Progress Notes (Signed)
Virtual Visit Consent   Frances [name], you are scheduled for a virtual visit with a [facility] provider today. Just as with appointments in the office, your consent must be obtained to participate. Your consent will be active for this visit and any virtual visit you may have with one of our providers in the next 365 days. If you have a MyChart account, a copy of this consent can be sent to you electronically.  As this is a virtual visit, video technology does not allow for your provider to perform a traditional examination. This may limit your provider's ability to fully assess your condition. If your provider identifies any concerns that need to be evaluated in person or the need to arrange testing (such as labs, EKG, etc.), we will make arrangements to do so. Although advances in technology are sophisticated, we cannot ensure that it will always work on either your end or our end. If the connection with a video visit is poor, the visit may have to be switched to a telephone visit. With either a video or telephone visit, we are not always able to ensure that we have a secure connection.  By engaging in this virtual visit, you consent to the provision of healthcare and authorize for your insurance to be billed (if applicable) for the services provided during this visit. Depending on your insurance coverage, you may receive a charge related to this service.  I need to obtain your verbal consent now. Are you willing to proceed with your visit today? Frances [name] has provided verbal consent on 12/10/2022 for a virtual visit (video or telephone). Margaretann Loveless, PA-C  Date: 12/10/2022 12:14 PM  Virtual Visit via Video Note   I, Margaretann Loveless, connected with  Frances [name]  ([id number], [birth year]) on 12/10/22 at 12:15 PM EDT by a video-enabled telemedicine application and verified that I am speaking with the correct person using two identifiers.  Location: Patient: Virtual Visit  Location Patient: Home Provider: Virtual Visit Location Provider: Home Office   I discussed the limitations of evaluation and management by telemedicine and the availability of in person appointments. The patient expressed understanding and agreed to proceed.    History of Present Illness: Frances [name] is a 27 y.o. who identifies as a female who was assigned female at birth, and is being seen today for possible UTI.  HPI: Urinary Tract Infection  This is a new problem. The current episode started yesterday. The problem occurs every urination. The problem has been unchanged. The quality of the pain is described as burning and aching. The pain is mild. There has been no fever. Associated symptoms include frequency, hesitancy, nausea and urgency. Pertinent negatives include no chills, discharge, flank pain or hematuria. She has tried increased fluids (cranberry tablets daily for prevention) for the symptoms. The treatment provided no relief. Her past medical history is significant for recurrent UTIs.     Problems:  Patient Active Problem List   Diagnosis Date Noted   Nonintractable headache 11/20/2022   Anemia 11/20/2022   Ear pressure, bilateral 07/26/2022   Congestion of throat 07/19/2022   Abdominal pain 07/19/2022   Galactorrhea 07/19/2022   Poor fetal growth 02/23/2021   Vaginal delivery 02/23/2021   Maternal care for poor fetal growth 01/17/2021   COVID-19 09/06/2020   Supervision of high risk pregnancy, antepartum 08/08/2020   Lesion of skin of scalp 01/03/2020   Breast nodule 06/07/2019   History of asthma 08/18/2018   History of seizure disorder 08/18/2018  History of kidney disease as a child 08/18/2018   History of recurrent UTIs 09/16/2016    Allergies:  Allergies  Allergen Reactions   Vancomycin Other (See Comments)    Nephrotoxicity   Medications:  Current Outpatient Medications:    nitrofurantoin, macrocrystal-monohydrate, (MACROBID) 100 MG capsule, Take 1  capsule (100 mg total) by mouth 2 (two) times daily., Disp: 10 capsule, Rfl: 0   ferrous sulfate 325 (65 FE) MG tablet, Take 1 tablet (325 mg total) by mouth every other day., Disp: 60 tablet, Rfl: 0   fluticasone (FLONASE) 50 MCG/ACT nasal spray, Place 1 spray into both nostrils daily., Disp: 16 g, Rfl: 6   ibuprofen (ADVIL) 600 MG tablet, Take 1 tablet (600 mg total) by mouth every 6 (six) hours. (Patient not taking: Reported on 07/18/2022), Disp: 30 tablet, Rfl: 0   methocarbamol (ROBAXIN) 500 MG tablet, Take 1 tablet (500 mg total) by mouth every 8 (eight) hours as needed for muscle spasms., Disp: 8 tablet, Rfl: 0   naproxen (NAPROSYN) 500 MG tablet, Take 1 tablet (500 mg total) by mouth 2 (two) times daily as needed for moderate pain. (Patient not taking: Reported on 07/18/2022), Disp: 15 tablet, Rfl: 0   ondansetron (ZOFRAN-ODT) 4 MG disintegrating tablet, Take 1 tablet (4 mg total) by mouth every 8 (eight) hours as needed for nausea or vomiting. (Patient not taking: Reported on 07/18/2022), Disp: 20 tablet, Rfl: 0   oseltamivir (TAMIFLU) 75 MG capsule, Take 1 capsule (75 mg total) by mouth every 12 (twelve) hours. (Patient not taking: Reported on 07/18/2022), Disp: 10 capsule, Rfl: 0  Observations/Objective: Patient is well-developed, well-nourished in no acute distress.  Resting comfortably at home.  Head is normocephalic, atraumatic.  No labored breathing.  Speech is clear and coherent with logical content.  Patient is alert and oriented at baseline.    Assessment and Plan: 1. Suspected UTI - nitrofurantoin, macrocrystal-monohydrate, (MACROBID) 100 MG capsule; Take 1 capsule (100 mg total) by mouth 2 (two) times daily.  Dispense: 10 capsule; Refill: 0  - Worsening symptoms.  - Will treat empirically with Macrobid - May use AZO for bladder spasms - Continue to push fluids.  - Seek in person evaluation for urine culture if symptoms do not improve or if they worsen.    Follow Up  Instructions: I discussed the assessment and treatment plan with the patient. The patient was provided an opportunity to ask questions and all were answered. The patient agreed with the plan and demonstrated an understanding of the instructions.  A copy of instructions were sent to the patient via MyChart unless otherwise noted below.    The patient was advised to call back or seek an in-person evaluation if the symptoms worsen or if the condition fails to improve as anticipated.  Time:  I spent 8 minutes with the patient via telehealth technology discussing the above problems/concerns.    Margaretann Loveless, PA-C

## 2022-12-11 ENCOUNTER — Ambulatory Visit (INDEPENDENT_AMBULATORY_CARE_PROVIDER_SITE_OTHER): Payer: Medicaid Other | Admitting: Internal Medicine

## 2022-12-11 ENCOUNTER — Encounter: Payer: Self-pay | Admitting: Internal Medicine

## 2022-12-11 VITALS — BP 134/72 | HR 89 | Temp 98.1°F | Ht 67.0 in

## 2022-12-11 DIAGNOSIS — T7840XA Allergy, unspecified, initial encounter: Secondary | ICD-10-CM | POA: Insufficient documentation

## 2022-12-11 MED ORDER — PREDNISONE 10 MG PO TABS: ORAL_TABLET | ORAL | 0 refills | Status: DC

## 2022-12-11 MED ORDER — METHYLPREDNISOLONE ACETATE 80 MG/ML IJ SUSP
80.0000 mg | Freq: Once | INTRAMUSCULAR | Status: AC
Start: 2022-12-11 — End: 2022-12-11
  Administered 2022-12-11: 80 mg via INTRAMUSCULAR

## 2022-12-11 MED ORDER — FAMOTIDINE 40 MG PO TABS: 40.0000 mg | ORAL_TABLET | Freq: Every day | ORAL | 0 refills | Status: DC

## 2022-12-11 NOTE — Assessment & Plan Note (Signed)
Acute Started yesterday-went to ED and received 2 prednisone, Pepcid, Benadryl.  Took Benadryl last night Symptoms recurred this morning and have been getting worse while at work Has facial swelling, erythema and itchiness.  No shortness of breath, oropharyngeal swelling or trouble swallowing 1 prior episode about 2 years ago No obvious cause Depo-Medrol 80 mg IM x 1 Start prednisone taper tomorrow morning with breakfast-30 mg x 2 days, 20 mg x 2 days, 10 mg x 2 days Benadryl at night for few days Claritin daily for 2 weeks and Pepcid daily for 2 weeks Referral ordered for allergy for further evaluation

## 2022-12-11 NOTE — Progress Notes (Signed)
Subjective:    Patient ID: Frances [name], female    DOB: 1996/07/13, 27 y.o.   MRN: [id number]      HPI Frances [name] is here for  Chief Complaint  Patient presents with   Allergic Reaction    Facial Swelling, Lip Swelling, Facial Redness    Her symptoms started yesterday-around 10:30 in the morning she noticed that her face felt tight and when she looked at it was red and swollen.  It was very itchy.  Her symptoms got worse.  She did go to the emergency room yesterday and they gave her to prednisone, Pepcid and Benadryl.  This morning she took another Benadryl before coming to work.  Have been getting worse since she was here-face is more swollen, red and itchy.  She denies any shortness of breath, cough, wheeze, throat swelling or trouble swallowing.  She had 1 other episode in the past about 2 years ago.  She is unsure what the cause was.  It was treated and it went away and she has not had any issues until now.  She denies any family history of similar symptoms.  She denies any obvious tick bites.  She has no known food allergies.     Medications and allergies reviewed with patient and updated if appropriate.  Current Outpatient Medications on File Prior to Visit  Medication Sig Dispense Refill   ferrous sulfate 325 (65 FE) MG tablet Take 1 tablet (325 mg total) by mouth every other day. 60 tablet 0   fluticasone (FLONASE) 50 MCG/ACT nasal spray Place 1 spray into both nostrils daily. 16 g 6   methocarbamol (ROBAXIN) 500 MG tablet Take 1 tablet (500 mg total) by mouth every 8 (eight) hours as needed for muscle spasms. 8 tablet 0   nitrofurantoin, macrocrystal-monohydrate, (MACROBID) 100 MG capsule Take 1 capsule (100 mg total) by mouth 2 (two) times daily. 10 capsule 0   [DISCONTINUED] omeprazole (PRILOSEC) 20 MG capsule Take 1 capsule (20 mg total) by mouth daily. (Patient not taking: No sig reported) 7 capsule 0   No current facility-administered medications on file prior  to visit.    Review of Systems  Constitutional:  Negative for fever.  HENT:  Negative for sore throat and trouble swallowing.        No throat swelling  Respiratory:  Negative for cough, shortness of breath and wheezing.   Cardiovascular:  Negative for chest pain and palpitations.  Neurological:  Negative for light-headedness and headaches.       Objective:   Vitals:   12/11/22 1450 12/11/22 1452  BP: (!) 138/110 134/72  Pulse: 89   Temp: 98.1 F (36.7 C)   SpO2: 99%    BP Readings from Last 3 Encounters:  12/11/22 134/72  12/10/22 118/72  11/18/22 126/87   Wt Readings from Last 3 Encounters:  12/10/22 189 lb (85.7 kg)  11/18/22 188 lb 9.6 oz (85.5 kg)  08/09/22 182 lb (82.6 kg)   Body mass index is 29.6 kg/m.    Physical Exam Constitutional:      General: She is not in acute distress.    Appearance: Normal appearance.  HENT:     Head: Normocephalic.     Comments: Swelling in lips and around eyes with mild erythema    Mouth/Throat:     Mouth: Mucous membranes are moist.     Pharynx: No posterior oropharyngeal erythema.     Comments: No oropharyngeal edema/swelling Eyes:     Conjunctiva/sclera: Conjunctivae normal.  Cardiovascular:     Rate and Rhythm: Normal rate and regular rhythm.  Pulmonary:     Effort: Pulmonary effort is normal. No respiratory distress.     Breath sounds: No stridor. No wheezing or rales.  Musculoskeletal:     Cervical back: Neck supple. No tenderness.     Right lower leg: No edema.     Left lower leg: No edema.  Lymphadenopathy:     Cervical: No cervical adenopathy.  Skin:    General: Skin is warm and dry.  Neurological:     Mental Status: She is alert.            Assessment & Plan:    See Problem List for Assessment and Plan of chronic medical problems.

## 2022-12-11 NOTE — Patient Instructions (Addendum)
     You had a steroid injection today     Medications changes include :   prednisone taper,  take bendaryl at night for a few nights, claritin daily for 2 weeks.  Take pepcid daily for 2 weeks.     A referral was ordered for Allergy.     Someone will call you to schedule an appointment.    Return if symptoms worsen or fail to improve.

## 2022-12-13 ENCOUNTER — Other Ambulatory Visit: Payer: Self-pay

## 2022-12-13 ENCOUNTER — Ambulatory Visit (INDEPENDENT_AMBULATORY_CARE_PROVIDER_SITE_OTHER): Payer: Medicaid Other | Admitting: Internal Medicine

## 2022-12-13 ENCOUNTER — Ambulatory Visit: Payer: Self-pay | Admitting: Internal Medicine

## 2022-12-13 VITALS — BP 106/74 | HR 80 | Temp 98.1°F | Resp 16 | Ht 67.0 in | Wt 186.5 lb

## 2022-12-13 DIAGNOSIS — L299 Pruritus, unspecified: Secondary | ICD-10-CM | POA: Diagnosis not present

## 2022-12-13 DIAGNOSIS — T783XXA Angioneurotic edema, initial encounter: Secondary | ICD-10-CM

## 2022-12-13 DIAGNOSIS — J3089 Other allergic rhinitis: Secondary | ICD-10-CM | POA: Diagnosis not present

## 2022-12-13 DIAGNOSIS — D649 Anemia, unspecified: Secondary | ICD-10-CM | POA: Diagnosis not present

## 2022-12-13 DIAGNOSIS — L501 Idiopathic urticaria: Secondary | ICD-10-CM | POA: Diagnosis not present

## 2022-12-13 DIAGNOSIS — R5383 Other fatigue: Secondary | ICD-10-CM | POA: Diagnosis not present

## 2022-12-13 MED ORDER — FLUTICASONE PROPIONATE 50 MCG/ACT NA SUSP
2.0000 | Freq: Every day | NASAL | 5 refills | Status: DC
Start: 2022-12-13 — End: 2023-04-01

## 2022-12-13 NOTE — Progress Notes (Signed)
NEW PATIENT  Date of Service/Encounter:  12/13/22  Consult requested by: Erick Alley, DO   Subjective:   Frances [name] (DOB: 04/03/1996) is a 27 y.o. female who presents to the clinic on 12/13/2022 with a chief complaint of Allergic Reaction (Face, lip swollen) .    History obtained from: chart review and patient.   Swelling: Reports onset of skin tightness, redness and facial swelling mostly around eyes on 12/10/2022 and went to the ER and was given prednisone, pepcid, benadryl. It did improve but next afternoon it worsened so she went to her PCP who gave her Depo 80mg  x1 and a prednisone taper starting at 30mg  for next 6 days.  Also Pepcid/benadryl.  Since then she has not had any reoccurence.  Does report facial itching with the swelling but no hives.  Had a similar reaction 2 years ago with redness and swelling around the eyes but not as severe. No new products, does not use makeup much. Does recall eating banana and hamburger meat the night prior She did have a UTI around the time and started antibiotics but the first pill was after the reaction. Also has bad seasonal allergies.   Rhinitis:  Started in childhood.  Symptoms include: nasal congestion, rhinorrhea, post nasal drainage, sneezing, watery eyes, and itchy eyes  Occurs seasonally-Spring/Summer  Potential triggers: not sure Treatments tried:  Flonase PRN Zyrtec or Claritin PRN; no recent use On benadryl for the reaction discussed above   Previous allergy testing: no History of reflux/heartburn: no History of sinus surgery: none Nonallergic triggers: none    Past Medical History: Past Medical History:  Diagnosis Date   Anemia    on Iron supplements   Asthma 1999   as a child; no current problems   Chronic kidney disease    as a child   Constipation    Gall stones    Gallstones 10/23/2016   10/2016: Seen by [facility], pt states, and plans for PP follow up. Advised her on low fat diet   Seizures (HCC)     seizure disorder as a child, treated with medication; none since   Past Surgical History: Past Surgical History:  Procedure Laterality Date   BACK SURGERY     IRRIGATION AND DEBRIDEMENT ABSCESS Left 06/11/2017   Procedure: IRRIGATION, DRAINAGE AND DEBRIDEMENT LEFT FLANK NECROTIZING SOFT TISSUE INFECTION;  Surgeon: Avel Peace, MD;  Location: [facility];  Service: General;  Laterality: Left;   TYMPANOSTOMY TUBE PLACEMENT     WOUND DEBRIDEMENT Left 06/12/2017   Procedure: DRESSING CHANGE AND DEBRIDEMENT OF WOUND;  Surgeon: Avel Peace, MD;  Location: [facility];  Service: General;  Laterality: Left;    Family History: Family History  Problem Relation Age of Onset   Asthma Mother    Hypertension Mother    Asthma Sister    Birth defects Sister        Down's syndrome   Intellectual disability Father    Medication List:  Allergies as of 12/13/2022       Reactions   Vancomycin Other (See Comments)   Nephrotoxicity        Medication List        Accurate as of Dec 13, 2022 10:03 AM. If you have any questions, ask your nurse or doctor.          famotidine 40 MG tablet Commonly known as: Pepcid Take 1 tablet (40 mg total) by mouth daily.   ferrous sulfate 325 (65 FE) MG tablet Take 1 tablet (325  mg total) by mouth every other day.   fluticasone 50 MCG/ACT nasal spray Commonly known as: FLONASE Place 1 spray into both nostrils daily.   methocarbamol 500 MG tablet Commonly known as: ROBAXIN Take 1 tablet (500 mg total) by mouth every 8 (eight) hours as needed for muscle spasms.   nitrofurantoin (macrocrystal-monohydrate) 100 MG capsule Commonly known as: Macrobid Take 1 capsule (100 mg total) by mouth 2 (two) times daily.   predniSONE 10 MG tablet Commonly known as: DELTASONE 3 tabs po qd x 2 days, then 2 tabs po qd x 2 days, then 1 tab po qd x 2 days         REVIEW OF SYSTEMS: Pertinent positives and negatives discussed in HPI.   Objective:   Physical  Exam: BP 106/74   Pulse 80   Temp 98.1 F (36.7 C) (Temporal)   Resp 16   Wt 186 lb 8 oz (84.6 kg)   SpO2 100%   BMI 29.21 kg/m  Body mass index is 29.21 kg/m. GEN: alert, well developed HEENT: clear conjunctiva, nose with + inferior turbinate hypertrophy, pink nasal mucosa, slight clear rhinorrhea, no cobblestoning HEART: regular rate and rhythm, no murmur LUNGS: clear to auscultation bilaterally, no coughing, unlabored respiration ABDOMEN: soft, non distended  SKIN: no rashes or lesions  Reviewed:  12/11/2022: seen by Dr. Lawerance Bach for an allergic reaction.  Given Depo with prednisone taper. Had facial swelling/redness/itching, no GI or pulm symptoms.  12/10/2022: seen in ED for facial swelling that started after waking up around 10 AM involving eyes and cheeks. No other symptoms. Noted to have bl periorbital edema on exam. Given prednisone, pepcid, benadryl in ED.   12/10/2022: seen by [facility] for painful, burning with urination and nausea, urgency, frequency, hesitation. Started on macrobid.  Assessment:   1. Angioedema, initial encounter   2. Pruritus   3. Other allergic rhinitis     Plan/Recommendations:  Angioedema (Swelling) with Pruritus - At this time etiology of swelling is unknown. Some patients can have swelling without hives and it can be caused by a variety of different triggers including illness/infection, exercise, pressure, vibrations, extremes of temperature to name a few however majority of the time there is no identifiable trigger.  - Pictures with localized redness and swelling around eyes. Could be idiopathic or related to illness-UTI. If frequent, can consider patch testing in case this is some form of contact dermatitis but I don't see any dryness/flaking on pictures, just redness. -We will obtain labs to rule out inflammatory/autoimmune/alpha gal/mast cell causes. - Finish out course of prednisone. - Hold all anti histamines (benadryl, zyrtec, claritin,  allegra, xyzal) 3 days prior to next visit.   Chronic Rhinitis: - Due to turbinate hypertrophy, seasonal symptoms and unresponsive to OTC meds, will perform skin testing to identify aeroallergen triggers.  Plan to do adult panel 1-59 and food- banana.  - Use nasal saline rinses before nose sprays such as with Neilmed Sinus Rinse.  Use distilled water.   - Use Flonase 2 sprays each nostril daily. Aim upward and outward.  Return in about 1 week (around 12/20/2022).  Alesia Morin, MD [facility]

## 2022-12-13 NOTE — Patient Instructions (Addendum)
Angioedema (Swelling) with Pruritus - At this time etiology of swelling is unknown. Some patients can have swelling without hives and it can be caused by a variety of different triggers including illness/infection, exercise, pressure, vibrations, extremes of temperature to name a few however majority of the time there is no identifiable trigger.  -We will obtain labs to rule out inflammatory/autoimmune/alpha gal/mast cell causes. -Finish out course of prednisone. - Hold all anti histamines (benadryl, zyrtec, claritin, allegra, xyzal) 3 days prior to next visit.   Chronic Rhinitis: - Use nasal saline rinses before nose sprays such as with Neilmed Sinus Rinse.  Use distilled water.   - Use Flonase 2 sprays each nostril daily. Aim upward and outward. - Hold all anti histamines (benadryl, zyrtec, claritin, allegra, xyzal) 3 days prior to next visit. We will do skin testing at the time to banana and adult panel.    Follow up with me on 12/20/2022 for skin testing at 3 PM

## 2022-12-14 LAB — TRYPTASE

## 2022-12-14 LAB — ALPHA-GAL PANEL

## 2022-12-14 LAB — C3 AND C4
Complement C3, Serum: 141 mg/dL (ref 82–167)
Complement C4, Serum: 31 mg/dL (ref 12–38)

## 2022-12-14 LAB — ENA+DNA/DS+SJORGEN'S: ENA SSA (RO) Ab: <0.2 AI (ref 0.0–0.9)

## 2022-12-14 LAB — CMP14+EGFR
ALT: 12 IU/L (ref 0–32)
Albumin: 4.5 g/dL (ref 4.0–5.0)
Alkaline Phosphatase: 56 IU/L (ref 44–121)
Calcium: 9.9 mg/dL (ref 8.7–10.2)
Glucose: 89 mg/dL (ref 70–99)
Sodium: 140 mmol/L (ref 134–144)

## 2022-12-16 ENCOUNTER — Encounter: Payer: Self-pay | Admitting: Internal Medicine

## 2022-12-16 LAB — CMP14+EGFR
AST: 16 IU/L (ref 0–40)
Albumin/Globulin Ratio: 1.6 (ref 1.2–2.2)
BUN: 10 mg/dL (ref 6–20)
Globulin, Total: 2.9 g/dL (ref 1.5–4.5)

## 2022-12-16 LAB — ALPHA-GAL PANEL

## 2022-12-16 LAB — ENA+DNA/DS+SJORGEN'S
ENA SSB (LA) Ab: <0.2 AI (ref 0.0–0.9)
dsDNA Ab: <1 IU/mL (ref 0–9)

## 2022-12-17 LAB — ALPHA-GAL PANEL
Allergen Lamb IgE: <0.1 kU/L
IgE (Immunoglobulin E), Serum: 13 IU/mL (ref 6–495)

## 2022-12-17 LAB — TSH+FREE T4
Free T4: 0.99 ng/dL (ref 0.82–1.77)
TSH: 0.933 u[IU]/mL (ref 0.450–4.500)

## 2022-12-17 LAB — CMP14+EGFR
BUN/Creatinine Ratio: 15 (ref 9–23)
Bilirubin Total: 0.3 mg/dL (ref 0.0–1.2)
CO2: 21 mmol/L (ref 20–29)
Chloride: 103 mmol/L (ref 96–106)
Creatinine, Ser: 0.67 mg/dL (ref 0.57–1.00)
Potassium: 4.2 mmol/L (ref 3.5–5.2)
Total Protein: 7.4 g/dL (ref 6.0–8.5)
eGFR: 125 mL/min/{1.73_m2} (ref >59)

## 2022-12-17 LAB — ENA+DNA/DS+SJORGEN'S
ENA RNP Ab: 1.3 AI — ABNORMAL HIGH (ref 0.0–0.9)
ENA SM Ab Ser-aCnc: <0.2 AI (ref 0.0–0.9)

## 2022-12-17 LAB — C1 ESTERASE INHIBITOR, FUNCTIONAL: C1INH Functional/C1INH Total MFr SerPl: 73 %mean normal

## 2022-12-17 LAB — ANA W/REFLEX: Anti Nuclear Antibody (ANA): POSITIVE — AB

## 2022-12-20 ENCOUNTER — Ambulatory Visit: Payer: Medicaid Other | Admitting: Internal Medicine

## 2022-12-31 ENCOUNTER — Ambulatory Visit (INDEPENDENT_AMBULATORY_CARE_PROVIDER_SITE_OTHER): Payer: Medicaid Other | Admitting: Internal Medicine

## 2022-12-31 ENCOUNTER — Encounter: Payer: Self-pay | Admitting: Internal Medicine

## 2022-12-31 ENCOUNTER — Other Ambulatory Visit: Payer: Self-pay

## 2022-12-31 VITALS — BP 118/76 | HR 80 | Temp 98.2°F

## 2022-12-31 DIAGNOSIS — T783XXD Angioneurotic edema, subsequent encounter: Secondary | ICD-10-CM

## 2022-12-31 DIAGNOSIS — J3089 Other allergic rhinitis: Secondary | ICD-10-CM

## 2022-12-31 DIAGNOSIS — L272 Dermatitis due to ingested food: Secondary | ICD-10-CM | POA: Diagnosis not present

## 2022-12-31 DIAGNOSIS — T783XXA Angioneurotic edema, initial encounter: Secondary | ICD-10-CM | POA: Diagnosis not present

## 2022-12-31 MED ORDER — FEXOFENADINE HCL 180 MG PO TABS: 180.0000 mg | ORAL_TABLET | Freq: Two times a day (BID) | ORAL | 5 refills | Status: DC | PRN

## 2022-12-31 NOTE — Progress Notes (Signed)
FOLLOW UP Date of Service/Encounter:  12/31/22   Subjective:  Frances [name] (DOB: 07/29/1996) is a 27 y.o. female who [facility] for follow up for skin testing.   History obtained from: chart review and patient. Last visit was with me on 12/13/2022 for angioedema, pruritus and other allergic rhinitis.   No further episodes of swelling since last visit.   Past Medical History: Past Medical History:  Diagnosis Date   Anemia    on Iron supplements   Asthma 1999   as a child; no current problems   Chronic kidney disease    as a child   Constipation    Gall stones    Gallstones 10/23/2016   10/2016: Seen by [facility], pt states, and plans for PP follow up. Advised her on low fat diet   Seizures (HCC)    seizure disorder as a child, treated with medication; none since    Objective:  BP 118/76   Pulse 80   Temp 98.2 F (36.8 C) (Temporal)   SpO2 100%  There is no height or weight on file to calculate BMI. Physical Exam: GEN: alert, well developed HEENT: clear conjunctiva, MMM HEART: regular rate  LUNGS:  no coughing, unlabored respiration SKIN: no rashes or lesions  Skin Testing:  Skin prick testing was placed, which includes aeroallergens/foods, histamine control, and saline control.  Verbal consent was obtained prior to placing test.  Patient tolerated procedure well.  Allergy testing results were read and interpreted by myself, documented by clinical staff. Adequate positive and negative control.  Positive results to:  Results discussed with patient/family.  Airborne Adult Perc - 12/31/22 1035     Time Antigen Placed 1035    Allergen Manufacturer Waynette Buttery    Location Back    Number of Test 59    1. Control-Buffer 50% Glycerol Negative    2. Control-Histamine 1 mg/ml 3+    3. Albumin saline Negative    4. Bahia Negative    5. French Southern Territories Negative    6. Johnson Negative    7. Kentucky Blue Negative    8. Meadow Fescue Negative     9. Perennial Rye Negative    10. Sweet Vernal Negative    11. Timothy Negative    12. Cocklebur Negative    13. Burweed Marshelder Negative    14. Ragweed, short Negative    15. Ragweed, Giant Negative    16. Plantain,  English Negative    17. Lamb's Quarters Negative    18. Sheep Sorrell Negative    19. Rough Pigweed Negative    20. Marsh Elder, Rough Negative    21. Mugwort, Common Negative    22. Ash mix Negative    23. Birch mix Negative    24. Beech American Negative    25. Box, Elder Negative    26. Cedar, red Negative    27. Cottonwood, Guinea-Bissau Negative    28. Elm mix Negative    29. Hickory Negative    30. Maple mix Negative    31. Oak, Guinea-Bissau mix Negative    32. Pecan Pollen Negative    33. Pine mix Negative    34. Sycamore Eastern Negative    35. Walnut, Black Pollen Negative    36. Alternaria alternata Negative    37. Cladosporium Herbarum Negative    38. Aspergillus mix Negative    39. Penicillium mix Negative    40. Bipolaris sorokiniana (Helminthosporium) Negative  41. Drechslera spicifera (Curvularia) Negative    42. Mucor plumbeus Negative    43. Fusarium moniliforme Negative    44. Aureobasidium pullulans (pullulara) Negative    45. Rhizopus oryzae Negative    46. Botrytis cinera Negative    47. Epicoccum nigrum Negative    48. Phoma betae Negative    49. Candida Albicans Negative    50. Trichophyton mentagrophytes Negative    51. Mite, D Farinae  5,000 AU/ml Negative    52. Mite, D Pteronyssinus  5,000 AU/ml Negative    53. Cat Hair 10,000 BAU/ml Negative    54.  Dog Epithelia Negative    55. Mixed Feathers Negative    56. Horse Epithelia Negative    57. Cockroach, German Negative    58. Mouse Negative    59. Tobacco Leaf Negative             Intradermal - 12/31/22 1114     Time Antigen Placed 1114    Allergen Manufacturer Waynette Buttery    Location Back    Number of Test 15    Intradermal Select    Control Negative    French Southern Territories Negative     Johnson Negative    7 Grass Negative    Ragweed mix Negative    Weed mix Negative    Tree mix Negative    Mold 1 Negative    Mold 2 Negative    Mold 3 Negative    Mold 4 Negative    Cat Negative    Dog Negative    Cockroach Negative    Mite mix Negative             Food Adult Perc - 12/31/22 1100     Time Antigen Placed 1035    Allergen Manufacturer Greer    Location Back    Number of allergen test 1    57. Banana Negative              Assessment:   1. Other allergic rhinitis   2. Angioedema, subsequent encounter   3. Dermatitis due to ingested food     Plan/Recommendations:  Angioedema (Swelling) with Pruritus - At this time etiology of swelling is unknown. Some patients can have swelling without hives and it can be caused by a variety of different triggers including illness/infection, exercise, pressure, vibrations, extremes of temperature to name a few however majority of the time there is no identifiable trigger.  - Pictures with localized redness and swelling around eyes. Could be idiopathic or related to illness-UTI. If frequent, can consider patch testing in case this is some form of contact dermatitis but I don't see any dryness/flaking on pictures, just redness.  - Skin test negative to banana today 12/2022 so do not think this is the culprit.   - Labs 12/2022: normal alpha gal, TSH/T4, renal/liver function, tryptase, C4 and C1 inhibitor function.  Positive ANA with low RNP, nonspecific and asymptomatic.   -If this returns, start Allegra 180mg  daily.   -If no improvement in 2-3 days, increase to Allegra 180mg  twice daily.   -If no improvement in 2-3 days, add Pepcid 20mg  twice daily and continue Allegra 180mg  twice daily.   Chronic Rhinitis: - Due to turbinate hypertrophy, seasonal symptoms and unresponsive to OTC meds, performed skin testing to identify aeroallergen triggers.   - Positive skin test 12/2022: none - Avoidance measures discussed. - Use  nasal saline rinses before nose sprays such as with Neilmed Sinus Rinse.  Use distilled water.   -  Use Flonase 2 sprays each nostril daily. Aim upward and outward. - Use Allegra 180mg  daily as needed.      Return in about 2 months (around 03/02/2023).  Alesia Morin, MD [facility]

## 2022-12-31 NOTE — Patient Instructions (Addendum)
Angioedema (Swelling) with Pruritus - At this time etiology of swelling is unknown. Some patients can have swelling without hives and it can be caused by a variety of different triggers including illness/infection, exercise, pressure, vibrations, extremes of temperature to name a few however majority of the time there is no identifiable trigger.   -If this returns, start Allegra 180mg  daily.   -If no improvement in 2-3 days, increase to Allegra 180mg  twice daily.   -If no improvement in 2-3 days, add Pepcid 20mg  twice daily and continue Allegra 180mg  twice daily.  Chronic Rhinitis: - Positive skin test 12/2022: none - Avoidance measures discussed. - Use nasal saline rinses before nose sprays such as with Neilmed Sinus Rinse.  Use distilled water.   - Use Flonase 2 sprays each nostril daily. Aim upward and outward. - Use Allegra 180mg  daily as needed.

## 2023-01-27 ENCOUNTER — Encounter (HOSPITAL_COMMUNITY): Payer: Self-pay | Admitting: *Deleted

## 2023-01-27 ENCOUNTER — Inpatient Hospital Stay (HOSPITAL_COMMUNITY)
Admission: AD | Admit: 2023-01-27 | Discharge: 2023-01-27 | Disposition: A | Discharge status: Home / Self Care | Payer: Medicaid Other | Attending: Obstetrics and Gynecology | Admitting: Obstetrics and Gynecology

## 2023-01-27 DIAGNOSIS — B9689 Other specified bacterial agents as the cause of diseases classified elsewhere: Secondary | ICD-10-CM | POA: Insufficient documentation

## 2023-01-27 DIAGNOSIS — O23591 Infection of other part of genital tract in pregnancy, first trimester: Secondary | ICD-10-CM | POA: Diagnosis not present

## 2023-01-27 DIAGNOSIS — N760 Acute vaginitis: Secondary | ICD-10-CM | POA: Diagnosis not present

## 2023-01-27 DIAGNOSIS — O26899 Other specified pregnancy related conditions, unspecified trimester: Secondary | ICD-10-CM

## 2023-01-27 DIAGNOSIS — R102 Pelvic and perineal pain: Secondary | ICD-10-CM | POA: Diagnosis present

## 2023-01-27 DIAGNOSIS — Z3A01 Less than 8 weeks gestation of pregnancy: Secondary | ICD-10-CM | POA: Diagnosis not present

## 2023-01-27 HISTORY — DX: Urinary tract infection, site not specified: N39.0

## 2023-01-27 LAB — URINALYSIS, ROUTINE W REFLEX MICROSCOPIC
Bilirubin Urine: NEGATIVE
Glucose, UA: NEGATIVE mg/dL
Hgb urine dipstick: NEGATIVE
Ketones, ur: NEGATIVE mg/dL
Leukocytes,Ua: NEGATIVE
Nitrite: NEGATIVE
Protein, ur: NEGATIVE mg/dL
Specific Gravity, Urine: 1.013 (ref 1.005–1.030)
pH: 5 (ref 5.0–8.0)

## 2023-01-27 LAB — WET PREP, GENITAL
Sperm: NONE SEEN
Trich, Wet Prep: NONE SEEN
WBC, Wet Prep HPF POC: <10 (ref <10)
Yeast Wet Prep HPF POC: NONE SEEN

## 2023-01-27 LAB — POCT PREGNANCY, URINE: Preg Test, Ur: POSITIVE — AB

## 2023-01-27 MED ORDER — METRONIDAZOLE 500 MG PO TABS: 500.0000 mg | ORAL_TABLET | Freq: Two times a day (BID) | ORAL | 0 refills | Status: AC

## 2023-01-27 NOTE — MAU Note (Signed)
Frances [name] is a 27 y.o. at Unknown here in MAU reporting: had a +HPT today.  Has been having pelvic pain. The last time she had sex, it was uncomfortable(been going on about a month).  LMP: 5/12 Onset of complaint: a wk, felt like period was going to start Pain score: 7 Vitals:   01/27/23 1826  BP: 122/75  Pulse: 85  Resp: 18  Temp: 98.2 F (36.8 C)  SpO2: 100%      Lab orders placed from triage:  uA, UPT

## 2023-01-27 NOTE — MAU Provider Note (Addendum)
History     [id number]  Arrival date and time: 01/27/23 1740   Chief Complaint  Patient presents with   Pelvic Pain   Possible Pregnancy   HPI Frances [name] is a 27 y.o. at [redacted]w[redacted]d by LMP who presents for pelvic pain.  Reports intermittent deep pelvic pain in the last 1 month, started prior to conception, worse with intercourse. Feels sharp when it occurs. Has not worsened since then. No other symptoms. No vaginal bleeding or cramping. No abnormal vaginal discharge. No no urinary urgency or dysuria.  OB History     Gravida  4   Para  3   Term  3   Preterm  0   AB  0   Living  3      SAB  0   IAB  0   Ectopic  0   Multiple  0   Live Births  3           Past Medical History:  Diagnosis Date   Anemia    on Iron supplements   Asthma 1999   as a child; no current problems   Chronic kidney disease    as a child   Constipation    Gall stones    Gallstones 10/23/2016   10/2016: Seen by [facility], pt states, and plans for PP follow up. Advised her on low fat diet   Seizures (HCC)    seizure disorder as a child, treated with medication; none since   UTI (urinary tract infection)     Past Surgical History:  Procedure Laterality Date   BACK SURGERY     IRRIGATION AND DEBRIDEMENT ABSCESS Left 06/11/2017   Procedure: IRRIGATION, DRAINAGE AND DEBRIDEMENT LEFT FLANK NECROTIZING SOFT TISSUE INFECTION;  Surgeon: Avel Peace, MD;  Location: [facility];  Service: General;  Laterality: Left;   TYMPANOSTOMY TUBE PLACEMENT     WOUND DEBRIDEMENT Left 06/12/2017   Procedure: DRESSING CHANGE AND DEBRIDEMENT OF WOUND;  Surgeon: Avel Peace, MD;  Location: [facility];  Service: General;  Laterality: Left;    Family History  Problem Relation Age of Onset   Heart disease Mother        ? arrythmia   Asthma Mother    Hypertension Mother    Intellectual disability Father    Asthma Sister    Birth defects Sister        Down's syndrome    Social History   Socioeconomic  History   Marital status: Single    Spouse name: Not on file   Number of children: 1   Years of education: 12   Highest education level: High school graduate  Occupational History   Not on file  Tobacco Use   Smoking status: Never   Smokeless tobacco: Never  Vaping Use   Vaping Use: Never used  Substance and Sexual Activity   Alcohol use: No   Drug use: Not Currently    Types: Marijuana    Comment: last used 05/2018   Sexual activity: Yes    Birth control/protection: None  Other Topics Concern   Not on file  Social History Narrative   Pt received Crib from [employer] on 01/17/2021.   Social Determinants of Health   Financial Resource Strain: Not on file  Food Insecurity: No Food Insecurity (02/19/2021)   Hunger Vital Sign    Worried About Running Out of Food in the Last Year: Never true    Ran Out of Food in the Last  Year: Never true  Transportation Needs: No Transportation Needs (02/19/2021)   PRAPARE - Administrator, Civil Service (Medical): No    Lack of Transportation (Non-Medical): No  Physical Activity: Not on file  Stress: Not on file  Social Connections: Not on file  Intimate Partner Violence: Not At Risk (08/18/2018)   Humiliation, Afraid, Rape, and Kick questionnaire    Fear of Current or Ex-Partner: No    Emotionally Abused: No    Physically Abused: No    Sexually Abused: No    Allergies  Allergen Reactions   Vancomycin Other (See Comments)    Nephrotoxicity    No current facility-administered medications on file prior to encounter.   Current Outpatient Medications on File Prior to Encounter  Medication Sig Dispense Refill   Multiple Vitamins-Minerals (WOMENS MULTI VITAMIN & MINERAL PO) Take 1 tablet by mouth daily.     ferrous sulfate 325 (65 FE) MG tablet Take 1 tablet (325 mg total) by mouth every other day. (Patient not taking: Reported on 12/13/2022) 60 tablet 0   fluticasone (FLONASE) 50 MCG/ACT nasal spray Place 2  sprays into both nostrils daily. 16 g 5   [DISCONTINUED] omeprazole (PRILOSEC) 20 MG capsule Take 1 capsule (20 mg total) by mouth daily. (Patient not taking: No sig reported) 7 capsule 0   Review of Systems  Constitutional:  Negative for chills and fever.  Eyes:  Negative for blurred vision.  Cardiovascular:  Negative for leg swelling.  Gastrointestinal:  Negative for abdominal pain and vomiting.  Genitourinary:  Negative for dysuria, frequency and urgency.  Musculoskeletal:  Negative for myalgias.  Skin:  Negative for itching.  Neurological:  Negative for dizziness.   Pertinent positives and negative per HPI, all others reviewed and negative  Physical Exam   BP 125/76 (BP Location: Right Arm)   Pulse 85   Temp 98.2 F (36.8 C) (Oral)   Resp 18   Ht 5\' 7"  (1.702 m)   Wt 86.5 kg   LMP 12/22/2022   SpO2 100%   BMI 29.87 kg/m   Patient Vitals for the past 24 hrs:  BP Temp Temp src Pulse Resp SpO2 Height Weight  01/27/23 2104 125/76 -- -- -- -- -- -- --  01/27/23 1826 122/75 98.2 F (36.8 C) Oral 85 18 100 % 5\' 7"  (1.702 m) 86.5 kg   Physical Exam Constitutional:      General: She is not in acute distress.    Appearance: Normal appearance.  Cardiovascular:     Rate and Rhythm: Normal rate.  Pulmonary:     Effort: Pulmonary effort is normal. No respiratory distress.  Abdominal:     General: Abdomen is flat.     Palpations: Abdomen is soft.     Tenderness: There is no abdominal tenderness.  Musculoskeletal:        General: No swelling.  Skin:    General: Skin is warm and dry.  Neurological:     Mental Status: She is alert.  Psychiatric:        Mood and Affect: Mood normal.        Behavior: Behavior normal.     Labs Results for orders placed or performed during the hospital encounter of 01/27/23 (from the past 24 hour(s))  Urinalysis, Routine w reflex microscopic -Urine, Clean Catch     Status: None   Collection Time: 01/27/23  6:38 PM  Result Value Ref Range    Color, Urine YELLOW YELLOW   APPearance CLEAR CLEAR  Specific Gravity, Urine 1.013 1.005 - 1.030   pH 5.0 5.0 - 8.0   Glucose, UA NEGATIVE NEGATIVE mg/dL   Hgb urine dipstick NEGATIVE NEGATIVE   Bilirubin Urine NEGATIVE NEGATIVE   Ketones, ur NEGATIVE NEGATIVE mg/dL   Protein, ur NEGATIVE NEGATIVE mg/dL   Nitrite NEGATIVE NEGATIVE   Leukocytes,Ua NEGATIVE NEGATIVE  Pregnancy, urine POC     Status: Abnormal   Collection Time: 01/27/23  6:40 PM  Result Value Ref Range   Preg Test, Ur POSITIVE (A) NEGATIVE  Wet prep, genital     Status: Abnormal   Collection Time: 01/27/23  7:21 PM   Specimen: Vaginal  Result Value Ref Range   Yeast Wet Prep HPF POC NONE SEEN NONE SEEN   Trich, Wet Prep NONE SEEN NONE SEEN   Clue Cells Wet Prep HPF POC PRESENT (A) NONE SEEN   WBC, Wet Prep HPF POC <10 <10   Sperm NONE SEEN     Imaging No results found.  MAU Course  Procedures Lab Orders         Wet prep, genital         Urinalysis, Routine w reflex microscopic -Urine, Clean Catch         Pregnancy, urine POC    Meds ordered this encounter  Medications   metroNIDAZOLE (FLAGYL) 500 MG tablet    Sig: Take 1 tablet (500 mg total) by mouth 2 (two) times daily for 7 days.    Dispense:  14 tablet    Refill:  0   Imaging Orders  No imaging studies ordered today    MDM Moderate (Level 3-4)  Assessment and Plan  #Bacterial vaginosis  [redacted] weeks gestation of pregnancy  Pelvic pain during pregnancy   Symptom of very intermittent deep pelvic pain, worse with intercourse. Started prior to conception. Has not worsened. Ddx include: vaginitis, cervicitis, PID, ectopic pregnancy, UTI. I do not think a pelvic US or hCG for ectopic work up is requited at this time, given irregularity of her symptoms and onset prior to conception and normal exam findings with no abdominal tenderness. Vitals are normal, exam is less concerning for PID. BV likely the cause of her pelvic pain. Rx for metronidazole  sent. No UTI or other concerns.  Will follow up on STI testing.  Encouraged to establish care with obstetric provider.  Dispo: discharged to home in stable condition  Allergies as of 01/27/2023       Reactions   Vancomycin Other (See Comments)   Nephrotoxicity        Medication List     STOP taking these medications    famotidine 40 MG tablet Commonly known as: Pepcid   fexofenadine 180 MG tablet Commonly known as: Allegra Allergy   methocarbamol 500 MG tablet Commonly known as: ROBAXIN   nitrofurantoin (macrocrystal-monohydrate) 100 MG capsule Commonly known as: Macrobid   predniSONE 10 MG tablet Commonly known as: DELTASONE       TAKE these medications    ferrous sulfate 325 (65 FE) MG tablet Take 1 tablet (325 mg total) by mouth every other day.   fluticasone 50 MCG/ACT nasal spray Commonly known as: FLONASE Place 2 sprays into both nostrils daily.   metroNIDAZOLE 500 MG tablet Commonly known as: Flagyl Take 1 tablet (500 mg total) by mouth 2 (two) times daily for 7 days.   WOMENS MULTI VITAMIN & MINERAL PO Take 1 tablet by mouth daily.       Sheppard Evens MD MPH OB  Fellow, Faculty Practice [facility], [facility] 01/27/2023

## 2023-01-28 LAB — GC/CHLAMYDIA PROBE AMP (~~LOC~~) NOT AT ARMC
Chlamydia: NEGATIVE
Comment: NEGATIVE
Comment: NORMAL
Neisseria Gonorrhea: NEGATIVE

## 2023-02-04 ENCOUNTER — Encounter: Payer: Self-pay | Admitting: Certified Nurse Midwife

## 2023-02-11 ENCOUNTER — Encounter: Payer: Self-pay | Admitting: Certified Nurse Midwife

## 2023-03-04 ENCOUNTER — Ambulatory Visit: Payer: Medicaid Other | Admitting: Internal Medicine

## 2023-03-05 ENCOUNTER — Telehealth (INDEPENDENT_AMBULATORY_CARE_PROVIDER_SITE_OTHER): Payer: Medicaid Other | Admitting: *Deleted

## 2023-03-05 DIAGNOSIS — O0991 Supervision of high risk pregnancy, unspecified, first trimester: Secondary | ICD-10-CM

## 2023-03-05 DIAGNOSIS — Z8709 Personal history of other diseases of the respiratory system: Secondary | ICD-10-CM

## 2023-03-05 DIAGNOSIS — O0990 Supervision of high risk pregnancy, unspecified, unspecified trimester: Secondary | ICD-10-CM | POA: Insufficient documentation

## 2023-03-05 DIAGNOSIS — Z3A10 10 weeks gestation of pregnancy: Secondary | ICD-10-CM

## 2023-03-05 DIAGNOSIS — Z8669 Personal history of other diseases of the nervous system and sense organs: Secondary | ICD-10-CM

## 2023-03-05 MED ORDER — GOJJI WEIGHT SCALE MISC
1.0000 | 0 refills | Status: DC
Start: 2023-03-05 — End: 2023-06-22

## 2023-03-05 MED ORDER — BLOOD PRESSURE KIT DEVI
1.0000 | 0 refills | Status: DC | PRN
Start: 2023-03-05 — End: 2023-06-22

## 2023-03-05 NOTE — Progress Notes (Signed)
New OB Intake  I connected with Frances [name]  on 03/05/23 at  1:15 PM EDT by MyChart Video Visit and verified that I am speaking with the correct person using two identifiers. Nurse is located at [facility] and pt is located at work. .  I discussed the limitations, risks, security and privacy concerns of performing an evaluation and management service by telephone and the availability of in person appointments. I also discussed with the patient that there may be a patient responsible charge related to this service. The patient expressed understanding and agreed to proceed.  I explained I am completing New OB Intake today. We discussed EDD of 09/28/2023, by Last Menstrual Period. Pt is [id number]. I reviewed her allergies, medications and Medical/Surgical/OB history.    Patient Active Problem List   Diagnosis Date Noted   Supervision of high risk pregnancy, antepartum 03/05/2023   Nonintractable headache 11/20/2022   History of asthma 08/18/2018   History of seizure disorder 08/18/2018   History of kidney disease as a child 08/18/2018   History of recurrent UTIs 09/16/2016    Concerns addressed today  Delivery Plans Plans to deliver at [facility]. Discussed the nature of our practice with multiple providers including residents and students. Due to the size of the practice, the delivering provider may not be the same as those providing prenatal care.   Patient is not interested in water birth.   MyChart/Babyscripts MyChart access verified. I explained pt will have some visits in office and some virtually. Babyscripts instructions given and order placed.  Blood Pressure Cuff/Weight Scale Blood pressure cuff ordered for patient to pick-up from [employer]. Explained after first prenatal appt pt will check weekly and document in Babyscripts. Patient does not have weight scale; order sent to [facility], patient may track weight weekly in Babyscripts.  Anatomy US Explained first  scheduled [place] will be around 19 weeks. Anatomy US scheduled for 05/05/23 at 1215.  Is patient a CenteringPregnancy candidate?  Declined Declined due to  Length of appointment   Is patient a Mom+Baby Combined Care candidate?  Not a candidate    Interested in [place] [place]? Yes, sent referral and doula dot phrase.    First visit review I reviewed new OB appt with patient. Explained pt will be seen by Edd Arbour, CNM at first visit. Discussed [name] genetic screening with patient. She would like  Panorama drawn with routine prenatal labs at new ob visit.   Last Pap Diagnosis  Date Value Ref Range Status  10/27/2019   Final   - Negative for intraepithelial lesion or malignancy (NILM)    Nancy Fetter 03/05/2023  1:50 PM

## 2023-03-05 NOTE — Patient Instructions (Signed)
Options for Doula Care in the [place]  As you review your birthing options, consider having a birth doula. A doula is trained to provide support before, during and just after you give birth. There are also postpartum doulas that help you adjust to new parenthood.  While doulas do not provide medical care, they do provide emotional, physical and educational support. A few months before your baby arrives, doulas can help answer questions, ease concerns and help you create and support your birthing plan.    Doulas can help reduce your stress and comfort you and your partner. They can help you cope with labor by helping you use breathing techniques, massage, creative labor positioning, essential oils and affirmations.   Studies show that the benefits of having a doula include:   A more positive birth experience  Fewer requests for pain-relief medication  Less likelihood of cesarean section, commonly called a c-section   Doulas are typically hired via a fee and contract between you and the doula. We are happy to provide a list of the most active doulas in the area, all of whom are credentialed by [facility] and will not count as a visitor at your birth.  There are several options for no-cost doula care at our hospital, including:  [facility] Volunteer Doula Program Every Baby [place] Doula Program A Cure 4 Moms Doula Study (available only at [facility], [facility], [place] and [place] [facility] offices)  For more information on these programs or to receive a list of doulas active in our area, please email [email or web address]         

## 2023-03-06 DIAGNOSIS — O0990 Supervision of high risk pregnancy, unspecified, unspecified trimester: Secondary | ICD-10-CM | POA: Diagnosis not present

## 2023-03-11 NOTE — Progress Notes (Unsigned)
History:   Frances [name] is a 27 y.o. [phone] at [redacted]w[redacted]d by {Ob dating:14516} being seen today for her first obstetrical visit.  Her obstetrical history is significant for {ob risk factors:10154}. Patient {does/does not:19097} intend to breast feed. Pregnancy history fully reviewed.  Patient reports {sx:14538}.      HISTORY: OB History  Gravida Para Term Preterm AB Living  4 3 3  0 0 3  SAB IAB Ectopic Multiple Live Births  0 0 0 0 3    # Outcome Date GA Lbr Len/2nd Weight Sex Type Anes PTL Lv  4 Current           3 Term 02/23/21 [redacted]w[redacted]d 02:30 / 00:08 5 lb 15.1 oz (2.696 kg) M Vag-Spont EPI  LIV     Birth Comments: IOL growth restrictions     Name: Frances [name]     Apgar1: 9  Apgar5: 9  2 Term 02/22/19 [redacted]w[redacted]d  6 lb 6.8 oz (2.914 kg) F Vag-Spont None  LIV     Birth Comments: wnl, precipitous delivery- delivery at home     Name: Frances [name]  1 Term 02/28/17 [redacted]w[redacted]d 18:20 / 00:27 8 lb 1.8 oz (3.68 kg) F Vag-Spont EPI  LIV     Birth Comments: WNL     Name: Frances [name]     Apgar1: 9  Apgar5: 9    Last pap smear was done *** and was {Normal/Abnormal Appearance:21344::"normal"}  Past Medical History:  Diagnosis Date   Anemia    on Iron supplements   Asthma 1999   as a child; no current problems   Chronic kidney disease    as a child   Constipation    Gall stones    Gallstones 10/23/2016   10/2016: Seen by [facility], pt states, and plans for PP follow up. Advised her on low fat diet   Seizures (HCC)    seizure disorder as a child, treated with medication; none since   UTI (urinary tract infection)    Past Surgical History:  Procedure Laterality Date   BACK SURGERY     IRRIGATION AND DEBRIDEMENT ABSCESS Left 06/11/2017   Procedure: IRRIGATION, DRAINAGE AND DEBRIDEMENT LEFT FLANK NECROTIZING SOFT TISSUE INFECTION;  Surgeon: Avel Peace, MD;  Location: [facility];  Service: General;  Laterality: Left;   TYMPANOSTOMY TUBE PLACEMENT     WOUND DEBRIDEMENT Left  06/12/2017   Procedure: DRESSING CHANGE AND DEBRIDEMENT OF WOUND;  Surgeon: Avel Peace, MD;  Location: [facility];  Service: General;  Laterality: Left;   Family History  Problem Relation Age of Onset   Heart disease Mother        ? arrythmia   Asthma Mother    Hypertension Mother    Intellectual disability Father    Asthma Sister    Birth defects Sister        Down's syndrome   Social History   Tobacco Use   Smoking status: Never   Smokeless tobacco: Never  Vaping Use   Vaping status: Never Used  Substance Use Topics   Alcohol use: No   Drug use: Not Currently    Types: Marijuana    Comment: last used 05/2018   Allergies  Allergen Reactions   Vancomycin Other (See Comments)    Nephrotoxicity   Current Outpatient Medications on File Prior to Visit  Medication Sig Dispense Refill   Blood Pressure Monitoring (BLOOD PRESSURE KIT) DEVI 1 Device by Does not apply route as needed. 1 each 0   ferrous sulfate  325 (65 FE) MG tablet Take 1 tablet (325 mg total) by mouth every other day. 60 tablet 0   fluticasone (FLONASE) 50 MCG/ACT nasal spray Place 2 sprays into both nostrils daily. 16 g 5   Misc. Devices (GOJJI WEIGHT SCALE) MISC 1 Device by Does not apply route once a week. 1 each 0   Multiple Vitamins-Minerals (WOMENS MULTI VITAMIN & MINERAL PO) Take 1 tablet by mouth daily.     Prenatal Vit-Fe Fumarate-FA (PRENATAL VITAMIN PO) Take 2 tablets by mouth daily at 6 (six) AM. gummies     [DISCONTINUED] omeprazole (PRILOSEC) 20 MG capsule Take 1 capsule (20 mg total) by mouth daily. (Patient not taking: No sig reported) 7 capsule 0   No current facility-administered medications on file prior to visit.    Review of Systems Pertinent items noted in HPI and remainder of comprehensive ROS otherwise negative. Physical Exam:  There were no vitals filed for this visit.    Constitutional: Well-developed, well-nourished pregnant female in no acute distress.  HEENT: PERRLA Skin:  normal color and turgor, no rash Cardiovascular: normal rate & rhythm, warm and well perfused Respiratory: normal effort, no problems with respiration noted GI: Abd soft, non-distended MS: Extremities nontender, no edema, normal ROM Neurologic: Alert and oriented x 4.  GU: no CVA tenderness Pelvic: NEFG, physiologic discharge, no blood, cervix clean. ***Pap/swabs collected ***Exam deferred  Assessment:    Pregnancy: [id number] Patient Active Problem List   Diagnosis Date Noted   Supervision of high risk pregnancy, antepartum 03/05/2023   Nonintractable headache 11/20/2022   History of asthma 08/18/2018   History of seizure disorder 08/18/2018   History of kidney disease as a child 08/18/2018   History of recurrent UTIs 09/16/2016     Plan:    1. Supervision of high risk pregnancy in first trimester ***  2. [redacted] weeks gestation of pregnancy ***  3. Initial obstetric visit in first trimester ***   Initial labs drawn. Continue prenatal vitamins. Problem list reviewed and updated. Genetic Screening discussed, {Blank multiple:19196::"First trimester screen","Quad screen","NIPS"}: {requests/ordered/declines:14581}. Ultrasound discussed; fetal anatomic survey: {requests/ordered/declines:14581}. Anticipatory guidance about prenatal visits given including labs, ultrasounds, and testing. Discussed usage of Babyscripts and virtual visits as additional source of managing and completing prenatal visits in midst of coronavirus and pandemic.   Encouraged to complete MyChart Registration for her ability to review results, send requests, and have questions addressed.  The nature of [facility] with multiple MDs and Advanced Practice Providers was explained to patient; also emphasized that residents, students are part of our team. Routine obstetric precautions reviewed. Encouraged to seek out care at office or emergency room [facility] preferred) for  urgent and/or emergent concerns. No follow-ups on file.    Future Appointments  Date Time Provider Department Center  03/12/2023  8:55 AM Osborne Oman [facility]  05/05/2023 12:15 PM [facility] NURSE [facility]  05/05/2023 12:30 PM [facility] US4 [facility]    Edd Arbour, MSN, CNM, IBCLC Certified Nurse Midwife, [facility]

## 2023-03-12 ENCOUNTER — Ambulatory Visit: Payer: Medicaid Other

## 2023-03-12 ENCOUNTER — Ambulatory Visit (INDEPENDENT_AMBULATORY_CARE_PROVIDER_SITE_OTHER): Payer: Medicaid Other | Admitting: Certified Nurse Midwife

## 2023-03-12 VITALS — BP 117/78 | HR 90 | Wt 196.7 lb

## 2023-03-12 DIAGNOSIS — O039 Complete or unspecified spontaneous abortion without complication: Secondary | ICD-10-CM

## 2023-03-12 DIAGNOSIS — Z3A11 11 weeks gestation of pregnancy: Secondary | ICD-10-CM | POA: Diagnosis not present

## 2023-03-12 DIAGNOSIS — Z0289 Encounter for other administrative examinations: Secondary | ICD-10-CM

## 2023-03-12 DIAGNOSIS — Z3490 Encounter for supervision of normal pregnancy, unspecified, unspecified trimester: Secondary | ICD-10-CM

## 2023-03-12 DIAGNOSIS — Z3491 Encounter for supervision of normal pregnancy, unspecified, first trimester: Secondary | ICD-10-CM

## 2023-03-12 DIAGNOSIS — O0991 Supervision of high risk pregnancy, unspecified, first trimester: Secondary | ICD-10-CM

## 2023-03-14 ENCOUNTER — Other Ambulatory Visit (HOSPITAL_COMMUNITY): Payer: Self-pay | Admitting: Obstetrics & Gynecology

## 2023-03-14 ENCOUNTER — Telehealth: Payer: Self-pay

## 2023-03-14 ENCOUNTER — Ambulatory Visit: Admit: 2023-03-14 | Payer: Medicaid Other | Admitting: Obstetrics & Gynecology

## 2023-03-14 ENCOUNTER — Other Ambulatory Visit: Payer: Self-pay

## 2023-03-14 ENCOUNTER — Encounter (HOSPITAL_COMMUNITY): Payer: Self-pay | Admitting: Obstetrics & Gynecology

## 2023-03-14 DIAGNOSIS — O021 Missed abortion: Secondary | ICD-10-CM

## 2023-03-14 SURGERY — DILATION AND EVACUATION, UTERUS
Anesthesia: Choice

## 2023-03-14 NOTE — Progress Notes (Signed)
SDW call  Surgery time is posted as "to be scheduled."  Instructed patient to call the OR desk, [phone], in the morning at 0715 to find out correct time of arrival.  Patient was given pre-op instructions over the phone. Patient verbalized understanding of instructions provided.     PCP - Dr. Darral Dash Cardiologist - denies Pulmonary: denies   PPM/ICD - denies Device Orders - n/a Rep Notified - n/a   Chest x-ray - 08/09/2022 EKG -  08/09/2022 Stress Test - ECHO -  Cardiac Cath -   Sleep Study/sleep apnea/CPAP: denies  Non-diabetic   Blood Thinner Instructions: denies Aspirin Instructions:denies   ERAS Protcol - No, NPO   COVID TEST-n/a   Anesthesia review: No    Patient denies shortness of breath, fever, cough and chest pain over the phone call  Your procedure is scheduled on Saturday Sugust 4, 2024            Call OR front desk at [phone] for scheduled time of arrival.  Then report to [facility] Entrance "A" and then check in with Admitting office. Call this number if you have problems the morning of surgery:  [phone]   If you have any questions prior to your surgery date call [phone]: Open Monday-Friday 8am-4pm If you experience any cold or flu symptoms such as cough, fever, chills, shortness of breath, etc. between now and your scheduled surgery, please notify us at the above number     Remember:  Do not eat or drink after midnight the night before your surgery  Take these medicines the morning of surgery with A SIP OF WATER: None   As of today, STOP taking any Aspirin (unless otherwise instructed by your surgeon) Aleve, Naproxen, Ibuprofen, Motrin, Advil, Goody's, BC's, all herbal medications, fish oil, and all vitamins.

## 2023-03-14 NOTE — Telephone Encounter (Signed)
Called patient to schedule emergency procedure at [facility]. Patient confirmed she has not eaten today and can have the procedure completed today if the OR has an opening. I advised the patient not to eat and I would try to have her scheduled for today. Asked her to give me 30 minutes to check with the OR.

## 2023-03-15 ENCOUNTER — Ambulatory Visit (HOSPITAL_COMMUNITY): Payer: Medicaid Other

## 2023-03-15 ENCOUNTER — Encounter (HOSPITAL_COMMUNITY): Payer: Self-pay | Admitting: Obstetrics & Gynecology

## 2023-03-15 ENCOUNTER — Ambulatory Visit (HOSPITAL_COMMUNITY)
Admission: RE | Admit: 2023-03-15 | Discharge: 2023-03-15 | Disposition: A | Discharge status: Home / Self Care | Payer: Medicaid Other | Attending: Obstetrics & Gynecology | Admitting: Obstetrics & Gynecology

## 2023-03-15 ENCOUNTER — Ambulatory Visit: Admit: 2023-03-15 | Payer: Medicaid Other | Admitting: Obstetrics & Gynecology

## 2023-03-15 ENCOUNTER — Encounter (HOSPITAL_COMMUNITY): Payer: Self-pay

## 2023-03-15 ENCOUNTER — Other Ambulatory Visit (HOSPITAL_COMMUNITY): Payer: Medicaid Other

## 2023-03-15 ENCOUNTER — Ambulatory Visit (HOSPITAL_COMMUNITY): Payer: Self-pay | Admitting: Anesthesiology

## 2023-03-15 ENCOUNTER — Ambulatory Visit (HOSPITAL_BASED_OUTPATIENT_CLINIC_OR_DEPARTMENT_OTHER): Payer: Medicaid Other | Admitting: Anesthesiology

## 2023-03-15 ENCOUNTER — Other Ambulatory Visit: Payer: Self-pay

## 2023-03-15 ENCOUNTER — Ambulatory Visit (HOSPITAL_COMMUNITY)
Admission: RE | Admit: 2023-03-15 | Payer: Medicaid Other | Source: Ambulatory Visit | Admitting: Obstetrics & Gynecology

## 2023-03-15 ENCOUNTER — Encounter (HOSPITAL_COMMUNITY)
Admission: RE | Disposition: A | Discharge status: Home / Self Care | Payer: Self-pay | Source: Home / Self Care | Attending: Obstetrics & Gynecology

## 2023-03-15 DIAGNOSIS — Z3A11 11 weeks gestation of pregnancy: Secondary | ICD-10-CM

## 2023-03-15 DIAGNOSIS — O021 Missed abortion: Secondary | ICD-10-CM

## 2023-03-15 DIAGNOSIS — O039 Complete or unspecified spontaneous abortion without complication: Secondary | ICD-10-CM

## 2023-03-15 HISTORY — DX: Missed abortion: O02.1

## 2023-03-15 HISTORY — PX: OPERATIVE ULTRASOUND: SHX5996

## 2023-03-15 HISTORY — PX: DILATION AND EVACUATION: SHX1459

## 2023-03-15 LAB — CBC
HCT: 38.5 % (ref 36.0–46.0)
Hemoglobin: 12.9 g/dL (ref 12.0–15.0)
MCH: 29.9 pg (ref 26.0–34.0)
MCHC: 33.5 g/dL (ref 30.0–36.0)
MCV: 89.3 fL (ref 80.0–100.0)
Platelets: 288 10*3/uL (ref 150–400)
RBC: 4.31 MIL/uL (ref 3.87–5.11)
RDW: 13 % (ref 11.5–15.5)
WBC: 5.5 10*3/uL (ref 4.0–10.5)
nRBC: 0 % (ref 0.0–0.2)

## 2023-03-15 SURGERY — DILATION AND EVACUATION, UTERUS
Anesthesia: Choice

## 2023-03-15 SURGERY — DILATION AND EVACUATION, UTERUS
Anesthesia: General

## 2023-03-15 MED ORDER — DOXYCYCLINE HYCLATE 100 MG IV SOLR
200.0000 mg | Freq: Once | INTRAVENOUS | Status: AC
  Administered 2023-03-15: 100 mg via INTRAVENOUS
  Filled 2023-03-15: qty 200

## 2023-03-15 MED ORDER — POVIDONE-IODINE 10 % EX SWAB
2.0000 | Freq: Once | CUTANEOUS | Status: AC
  Administered 2023-03-15: 2 via TOPICAL

## 2023-03-15 MED ORDER — ONDANSETRON HCL 4 MG/2ML IJ SOLN
INTRAMUSCULAR | Status: AC
  Filled 2023-03-15: qty 2

## 2023-03-15 MED ORDER — KETOROLAC TROMETHAMINE 30 MG/ML IJ SOLN
30.0000 mg | Freq: Once | INTRAMUSCULAR | Status: AC
  Administered 2023-03-15: 30 mg via INTRAVENOUS
  Filled 2023-03-15: qty 1

## 2023-03-15 MED ORDER — IBUPROFEN 600 MG PO TABS: 600.0000 mg | ORAL_TABLET | Freq: Four times a day (QID) | ORAL | 0 refills | Status: AC | PRN

## 2023-03-15 MED ORDER — FENTANYL CITRATE (PF) 250 MCG/5ML IJ SOLN
INTRAMUSCULAR | Status: DC | PRN
  Administered 2023-03-15: 50 ug via INTRAVENOUS

## 2023-03-15 MED ORDER — SODIUM CHLORIDE 0.9 % IV SOLN
100.0000 mg | Freq: Once | INTRAVENOUS | Status: DC
  Filled 2023-03-15: qty 100

## 2023-03-15 MED ORDER — BUPIVACAINE-EPINEPHRINE 0.5% -1:200000 IJ SOLN
INTRAMUSCULAR | Status: DC | PRN
  Administered 2023-03-15: 20 mL

## 2023-03-15 MED ORDER — ACETAMINOPHEN 325 MG PO TABS: 650.0000 mg | ORAL_TABLET | Freq: Four times a day (QID) | ORAL | 0 refills | Status: DC | PRN

## 2023-03-15 MED ORDER — BUPIVACAINE-EPINEPHRINE (PF) 0.5% -1:200000 IJ SOLN
INTRAMUSCULAR | Status: AC
  Filled 2023-03-15: qty 30

## 2023-03-15 MED ORDER — ACETAMINOPHEN 10 MG/ML IV SOLN
INTRAVENOUS | Status: AC
  Filled 2023-03-15: qty 100

## 2023-03-15 MED ORDER — PROPOFOL 10 MG/ML IV BOLUS
INTRAVENOUS | Status: AC
  Filled 2023-03-15: qty 20

## 2023-03-15 MED ORDER — CHLORHEXIDINE GLUCONATE 0.12 % MT SOLN
15.0000 mL | Freq: Once | OROMUCOSAL | Status: AC
  Administered 2023-03-15: 15 mL via OROMUCOSAL

## 2023-03-15 MED ORDER — ORAL CARE MOUTH RINSE: 15.0000 mL | Freq: Once | OROMUCOSAL | Status: AC

## 2023-03-15 MED ORDER — ACETAMINOPHEN 10 MG/ML IV SOLN
INTRAVENOUS | Status: DC | PRN
Start: 2023-03-15 — End: 2023-03-15
  Administered 2023-03-15: 1000 mg via INTRAVENOUS

## 2023-03-15 MED ORDER — FENTANYL CITRATE (PF) 250 MCG/5ML IJ SOLN
INTRAMUSCULAR | Status: AC
  Filled 2023-03-15: qty 5

## 2023-03-15 MED ORDER — ONDANSETRON HCL 4 MG/2ML IJ SOLN
INTRAMUSCULAR | Status: DC | PRN
  Administered 2023-03-15: 4 mg via INTRAVENOUS

## 2023-03-15 MED ORDER — KETOROLAC TROMETHAMINE 30 MG/ML IJ SOLN
INTRAMUSCULAR | Status: AC
  Filled 2023-03-15: qty 1

## 2023-03-15 MED ORDER — LIDOCAINE 2% (20 MG/ML) 5 ML SYRINGE
INTRAMUSCULAR | Status: DC | PRN
  Administered 2023-03-15: 100 mg via INTRAVENOUS

## 2023-03-15 MED ORDER — 0.9 % SODIUM CHLORIDE (POUR BTL) OPTIME
TOPICAL | Status: DC | PRN
  Administered 2023-03-15 (×2): 1000 mL

## 2023-03-15 MED ORDER — PROPOFOL 10 MG/ML IV BOLUS
INTRAVENOUS | Status: DC | PRN
  Administered 2023-03-15: 200 mg via INTRAVENOUS

## 2023-03-15 MED ORDER — DEXAMETHASONE SODIUM PHOSPHATE 10 MG/ML IJ SOLN
INTRAMUSCULAR | Status: AC
  Filled 2023-03-15: qty 1

## 2023-03-15 MED ORDER — MIDAZOLAM HCL 2 MG/2ML IJ SOLN
INTRAMUSCULAR | Status: DC | PRN
  Administered 2023-03-15: 2 mg via INTRAVENOUS

## 2023-03-15 MED ORDER — MIDAZOLAM HCL 2 MG/2ML IJ SOLN
INTRAMUSCULAR | Status: AC
  Filled 2023-03-15: qty 2

## 2023-03-15 MED ORDER — DEXAMETHASONE SODIUM PHOSPHATE 10 MG/ML IJ SOLN
INTRAMUSCULAR | Status: DC | PRN
  Administered 2023-03-15: 10 mg via INTRAVENOUS

## 2023-03-15 MED ORDER — LACTATED RINGERS IV SOLN: INTRAVENOUS | Status: DC

## 2023-03-15 SURGICAL SUPPLY — 22 items
CATH ROBINSON RED A/P 16FR (CATHETERS) ×1 IMPLANT
FILTER UTR ASPR ASSEMBLY (MISCELLANEOUS) ×1 IMPLANT
GAUZE 4X4 16PLY ~~LOC~~+RFID DBL (SPONGE) ×1 IMPLANT
GLOVE BIO SURGEON STRL SZ 6.5 (GLOVE) ×1 IMPLANT
GLOVE BIOGEL PI IND STRL 7.0 (GLOVE) ×2 IMPLANT
GOWN STRL REUS W/ TWL LRG LVL3 (GOWN DISPOSABLE) ×2 IMPLANT
GOWN STRL REUS W/TWL LRG LVL3 (GOWN DISPOSABLE) ×2
HOSE CONNECTING 18IN BERKELEY (TUBING) ×1 IMPLANT
KIT BERKELEY 1ST TRI 3/8 NO TR (MISCELLANEOUS) ×1 IMPLANT
KIT BERKELEY 1ST TRIMESTER 3/8 (MISCELLANEOUS) ×1 IMPLANT
NS IRRIG 1000ML POUR BTL (IV SOLUTION) ×1 IMPLANT
PACK VAGINAL MINOR WOMEN LF (CUSTOM PROCEDURE TRAY) ×1 IMPLANT
PAD OB MATERNITY 4.3X12.25 (PERSONAL CARE ITEMS) ×1 IMPLANT
SET BERKELEY SUCTION TUBING (SUCTIONS) ×1 IMPLANT
SPIKE FLUID TRANSFER (MISCELLANEOUS) ×1 IMPLANT
TOWEL GREEN STERILE FF (TOWEL DISPOSABLE) ×2 IMPLANT
UNDERPAD 30X36 HEAVY ABSORB (UNDERPADS AND DIAPERS) ×1 IMPLANT
VACURETTE 10 RIGID CVD (CANNULA) IMPLANT
VACURETTE 12 RIGID CVD (CANNULA) IMPLANT
VACURETTE 7MM CVD STRL WRAP (CANNULA) IMPLANT
VACURETTE 8 RIGID CVD (CANNULA) IMPLANT
VACURETTE 9 RIGID CVD (CANNULA) IMPLANT

## 2023-03-15 NOTE — Op Note (Signed)
Preoperative diagnosis: Missed AB  Postoperative diagnosis: Same  Anesthesia: General and paracervical block  Procedure: Dilatation and evacuation under US guidance  Surgeon: Dr. Myna Hidalgo  Estimated blood loss: 10cc UOP: 50cc IVF: 700cc  Procedure:  Pt was taken to the OR where general anesthesia was performed.  She was placed in lithotomy position.  She was prepped and draped in a sterile fashion.  The bladder was drained using a red rubber.  A weighted speculum is inserted in the vagina and the anterior lip of the cervix was grasped with a tenaculum forcep. We proceed with a paracervical block using 0.5% Lidocaine with epinephrine. The uterus was then sounded at 13 cm. The cervix was easily dilated using Hegar dilator until  #12 cannula could be inserted. Using a #12 curved cannula under US guidance, evacuation of products of conception was completed without difficulty. Sharp curettage of the uterine cavity to confirm complete evacuation.  Under US guidance complete evacuation was confirmed.  Instruments are then removed. Instrument and sponge count is complete x2.     Estimated blood loss is minimal.  The procedure is very well tolerated by the patient is taken to recovery room in a well and stable condition.  Specimen: Products of conception sent to [facility] testing  Myna Hidalgo, DO Attending Obstetrician & Gynecologist, [facility] for [facility], [facility]

## 2023-03-15 NOTE — Transfer of Care (Signed)
Immediate Anesthesia Transfer of Care Note  Patient: Frances [name]  Procedure(s) Performed: DILATATION AND EVACUATION OPERATIVE ULTRASOUND  Patient Location: PACU  Anesthesia Type:General  Level of Consciousness: awake, alert , and oriented  Airway & Oxygen Therapy: Patient Spontanous Breathing  Post-op Assessment: Report given to RN, Post -op Vital signs reviewed and stable, and Patient moving all extremities X 4  Post vital signs: Reviewed and stable  Last Vitals:  Vitals Value Taken Time  BP 134/108 03/15/23 0940  Temp 36.8 C 03/15/23 0940  Pulse 96 03/15/23 0943  Resp 25 03/15/23 0943  SpO2 100 % 03/15/23 0943  Vitals shown include unfiled device data.  Last Pain:  Vitals:   03/15/23 0806  TempSrc: Oral         Complications: No notable events documented.

## 2023-03-15 NOTE — H&P (Signed)
Faculty Practice Obstetrics and Gynecology Attending History and Physical  Frances [name] is a 26 y.o. [phone] at [redacted]w[redacted]d who presents for scheduled D&E due to incomplete AB/miscarriag  In review, recent US completed on 7/31 showed 8wk CRL with no fetal heart tones.  She denies vaginal bleeding, cramping or abdominal pain.  Denies fever, some night sweats.  No chills.    Denies nausea, vomiting, other GI or GU symptoms or other general symptoms.  Past Medical History:  Diagnosis Date   Anemia    on Iron supplements   Asthma 1999   as a child; no current problems   Chronic kidney disease    as a child   Constipation    Gall stones    Gallstones 10/23/2016   10/2016: Seen by [facility], pt states, and plans for PP follow up. Advised her on low fat diet   Seizures (HCC)    seizure disorder as a child, treated with medication; none since   UTI (urinary tract infection)    Past Surgical History:  Procedure Laterality Date   BACK SURGERY     IRRIGATION AND DEBRIDEMENT ABSCESS Left 06/11/2017   Procedure: IRRIGATION, DRAINAGE AND DEBRIDEMENT LEFT FLANK NECROTIZING SOFT TISSUE INFECTION;  Surgeon: Avel Peace, MD;  Location: [facility];  Service: General;  Laterality: Left;   TYMPANOSTOMY TUBE PLACEMENT     WOUND DEBRIDEMENT Left 06/12/2017   Procedure: DRESSING CHANGE AND DEBRIDEMENT OF WOUND;  Surgeon: Avel Peace, MD;  Location: [facility];  Service: General;  Laterality: Left;   OB History  Gravida Para Term Preterm AB Living  4 3 3  0 0 3  SAB IAB Ectopic Multiple Live Births  0 0 0 0 3    # Outcome Date GA Lbr Len/2nd Weight Sex Type Anes PTL Lv  4 Current           3 Term 02/23/21 [redacted]w[redacted]d 02:30 / 00:08 2696 g M Vag-Spont EPI  LIV     Birth Comments: IOL growth restrictions  2 Term 02/22/19 [redacted]w[redacted]d  2914 g F Vag-Spont None  LIV     Birth Comments: wnl, precipitous delivery- delivery at home  1 Term 02/28/17 [redacted]w[redacted]d 18:20 / 00:27 3680 g F Vag-Spont EPI  LIV     Birth Comments: WNL   Patient denies any other pertinent gynecologic issues.  No current facility-administered medications on file prior to encounter.   Current Outpatient Medications on File Prior to Encounter  Medication Sig Dispense Refill   Prenatal Vit-Fe Fumarate-FA (PRENATAL VITAMIN PO) Take 2 tablets by mouth daily at 6 (six) AM. gummies     Blood Pressure Monitoring (BLOOD PRESSURE KIT) DEVI 1 Device by Does not apply route as needed. 1 each 0   ferrous sulfate 325 (65 FE) MG tablet Take 1 tablet (325 mg total) by mouth every other day. (Patient not taking: Reported on 03/12/2023) 60 tablet 0   fluticasone (FLONASE) 50 MCG/ACT nasal spray Place 2 sprays into both nostrils daily. (Patient not taking: Reported on 03/12/2023) 16 g 5   Misc. Devices (GOJJI WEIGHT SCALE) MISC 1 Device by Does not apply route once a week. (Patient not taking: Reported on 03/12/2023) 1 each 0   [DISCONTINUED] omeprazole (PRILOSEC) 20 MG capsule Take 1 capsule (20 mg total) by mouth daily. (Patient not taking: No sig reported) 7 capsule 0   Allergies  Allergen Reactions   Vancomycin Other (See Comments)    Nephrotoxicity    Social History:   reports that she has never  smoked. She has never used smokeless tobacco. She reports that she does not currently use drugs after having used the following drugs: Marijuana. She reports that she does not drink alcohol. Family History  Problem Relation Age of Onset   Heart disease Mother        ? arrythmia   Asthma Mother    Hypertension Mother    Intellectual disability Father    Asthma Sister    Birth defects Sister        Down's syndrome    Review of Systems: Pertinent items noted in HPI and remainder of comprehensive ROS otherwise negative.  PHYSICAL EXAM: Blood pressure 106/70, pulse 98, temperature 99.1 F (37.3 C), temperature source Oral, resp. rate 19, height 5' 7.01" (1.702 m), weight 89.2 kg, last menstrual period 12/22/2022, SpO2 100%. CONSTITUTIONAL: Well-developed,  well-nourished female in no acute distress.  SKIN: Skin is warm and dry. No rash noted. Not diaphoretic. No erythema. No pallor. NEUROLOGIC: Alert and oriented to person, place, and time. Normal reflexes, muscle tone coordination. No cranial nerve deficit noted. PSYCHIATRIC: Normal mood and affect. Normal behavior. Normal judgment and thought content. CARDIOVASCULAR: Normal heart rate noted, regular rhythm RESPIRATORY: Effort and breath sounds normal, no problems with respiration noted ABDOMEN: Soft, nontender, nondistended. PELVIC: deferred MUSCULOSKELETAL: no calf tenderness bilaterally EXT: no edema bilaterally, normal pulses  Labs: Results for orders placed or performed during the hospital encounter of 03/15/23 (from the past 336 hour(s))  CBC   Collection Time: 03/15/23  7:46 AM  Result Value Ref Range   WBC 5.5 4.0 - 10.5 K/uL   RBC 4.31 3.87 - 5.11 MIL/uL   Hemoglobin 12.9 12.0 - 15.0 g/dL   HCT 40.9 81.1 - 91.4 %   MCV 89.3 80.0 - 100.0 fL   MCH 29.9 26.0 - 34.0 pg   MCHC 33.5 30.0 - 36.0 g/dL   RDW 78.2 95.6 - 21.3 %   Platelets 288 150 - 400 K/uL   nRBC 0.0 0.0 - 0.2 %    Imaging Studies: US OB LESS THAN 14 WEEKS WITH OB TRANSVAGINAL  Result Date: 03/13/2023 ----------------------------------------------------------------------  OBSTETRICS REPORT                       (Signed Final 03/13/2023 10:44 am) ---------------------------------------------------------------------- Patient Info  ID #:       [id number]                          D.O.B.:  [birth year] (26 yrs)  Name:       Frances [name]               Visit Date: 03/12/2023 12:20 pm ---------------------------------------------------------------------- Performed By  Attending:        Mariel Aloe MD       Ref. Address:     [address]                                                             [place], [place]  [address]  Performed By:     Otilio Jefferson BA        [facility]         [facility]                    [facility]                                 [facility]  Referred By:      [facility]          Visit Type:       OB                    for Women ---------------------------------------------------------------------- Orders  #  Description                           Code        Ordered By  1  US OB LESS THAN 14 WEEKS              [id number]     Edd Arbour     WITH OB TRANSVAGINAL ----------------------------------------------------------------------  #  Order #                     Accession #                Episode #  1  [id number]                   [id number]                 [id number] ---------------------------------------------------------------------- Indications  [redacted] weeks gestation of pregnancy                Z3A.11  Pregnancy with inconclusive fetal viability    O36.80X0 ---------------------------------------------------------------------- Fetal Evaluation  Num Of Fetuses:         1  Preg. Location:         Intrauterine  Gest. Sac:              Intrauterine  Yolk Sac:               Not visualized  Fetal Pole:             Visualized  Fetal Heart Rate(bpm):  0  Cardiac Activity:       Absent  Comment:    No fetal heart motion or yolk sac noted ---------------------------------------------------------------------- Biometry  GS:         49  mm     G. Age:  11w 3d                  EDD:   09/28/23  CRL:        21  mm     G. Age:  8w 4d                   EDD:   10/18/23 ---------------------------------------------------------------------- Gestational Age  Best:          11w 3d     Det. By:  U/S G S (03/12/23)       EDD:   09/28/23 ----------------------------------------------------------------------                Mariel Aloe, MD Electronically Signed Final Report    03/13/2023 10:44 am ----------------------------------------------------------------------    Blood type: A pos, antibody neg  Assessment: Incomplete AB/miscarriage  Plan: Dilation and evacuation under US guidance -IV Doxy 200mg  ordered -desires genetic testing-order placed -NPO -LR @ 125cc/hr -SCDs to OR -Risk/benefits and alternatives reviewed with the patient including but not limited to risk of bleeding, infection and injury such as uterine perforation requiring further surgical intervention.  Questions and concerns were addressed and pt desires to proceed  Myna Hidalgo, DO Attending Obstetrician & Gynecologist, [facility] for [facility], [facility]

## 2023-03-15 NOTE — Anesthesia Preprocedure Evaluation (Signed)
Anesthesia Evaluation  Patient identified by MRN, date of birth, ID band Patient awake    Reviewed: Allergy & Precautions, H&P , NPO status , Patient's Chart, lab work & pertinent test results  Airway Mallampati: II  TM Distance: >3 FB Neck ROM: Full    Dental  (+) Teeth Intact, Dental Advisory Given   Pulmonary neg pulmonary ROS, Patient abstained from smoking.   breath sounds clear to auscultation       Cardiovascular negative cardio ROS  Rhythm:Regular     Neuro/Psych negative neurological ROS  negative psych ROS   GI/Hepatic negative GI ROS, Neg liver ROS,,,  Endo/Other  negative endocrine ROS    Renal/GU negative Renal ROS  negative genitourinary   Musculoskeletal negative musculoskeletal ROS (+)    Abdominal   Peds  Hematology negative hematology ROS (+) Lab Results      Component                Value               Date                      WBC                      5.5                 03/15/2023                HGB                      12.9                03/15/2023                HCT                      38.5                03/15/2023                MCV                      89.3                03/15/2023                PLT                      288                 03/15/2023              Anesthesia Other Findings   Reproductive/Obstetrics Lab Results      Component                Value               Date                      PREGTESTUR               POSITIVE (A)        01/27/2023                HCG                      <  5.0                01/17/2022            Missed ab                             Anesthesia Physical Anesthesia Plan  ASA: 2  Anesthesia Plan: General   Post-op Pain Management: Toradol IV (intra-op)* and Ofirmev IV (intra-op)*   Induction: Intravenous  PONV Risk Score and Plan: 3 and Ondansetron and Dexamethasone  Airway Management Planned:  LMA  Additional Equipment: None  Intra-op Plan:   Post-operative Plan: Extubation in OR  Informed Consent: I have reviewed the patients History and Physical, chart, labs and discussed the procedure including the risks, benefits and alternatives for the proposed anesthesia with the patient or authorized representative who has indicated his/her understanding and acceptance.     Dental advisory given  Plan Discussed with: CRNA  Anesthesia Plan Comments:        Anesthesia Quick Evaluation

## 2023-03-15 NOTE — Anesthesia Procedure Notes (Signed)
Procedure Name: LMA Insertion Date/Time: 03/15/2023 9:02 AM  Performed by: Quentin Ore, CRNAPre-anesthesia Checklist: Patient identified, Emergency Drugs available, Suction available and Patient being monitored Patient Re-evaluated:Patient Re-evaluated prior to induction Oxygen Delivery Method: Circle system utilized Preoxygenation: Pre-oxygenation with 100% oxygen Induction Type: IV induction LMA: LMA inserted LMA Size: 4.0 Number of attempts: 1 Placement Confirmation: positive ETCO2 and breath sounds checked- equal and bilateral Tube secured with: Tape Dental Injury: Teeth and Oropharynx as per pre-operative assessment

## 2023-03-15 NOTE — Discharge Instructions (Signed)
HOME INSTRUCTIONS  Please note any unusual or excessive bleeding, pain, swelling. Mild dizziness or drowsiness are normal for about 24 hours after surgery.   Shower when comfortable  Restrictions: No driving for 24 hours or while taking pain medications.  Activity:  Nothing in vagina (no tampons, douching, or intercourse) x 4 weeks; no tub baths for 4 weeks Vaginal spotting is expected but if your bleeding is heavy, period like,  please call the office   Diet:  You may return to your regular diet.  Do not eat large meals.  Eat small frequent meals throughout the day.  Continue to drink a good amount of water at least 6-8 glasses of water per day, hydration is very important for the healing process.  Pain Management: Take over the counter tylenol or ibuprofen as needed for pain.  You can either take one or alternate between the two medications for pain management.  You may also use a heating pack as needed.    Alcohol -- Avoid for 24 hours and while taking pain medications.  Nausea: Take sips of ginger ale or soda  Fever -- Call physician if temperature over 101 degrees  Follow up:  If you do not already have a follow up appointment scheduled, please call your office and schedule a follow up appointment in 2-3 weeks. If you experience fever (a temperature greater than 100.4), pain unrelieved by pain medication, shortness of breath, swelling of a single leg, or any other symptoms which are concerning to you please the office immediately.

## 2023-03-16 ENCOUNTER — Encounter (HOSPITAL_COMMUNITY): Payer: Self-pay | Admitting: Obstetrics & Gynecology

## 2023-03-17 ENCOUNTER — Encounter: Payer: Self-pay | Admitting: Certified Nurse Midwife

## 2023-03-17 NOTE — Anesthesia Postprocedure Evaluation (Signed)
Anesthesia Post Note  Patient: Frances [name]  Procedure(s) Performed: DILATATION AND EVACUATION OPERATIVE ULTRASOUND     Patient location during evaluation: PACU Anesthesia Type: General Level of consciousness: awake and alert Pain management: pain level controlled Vital Signs Assessment: post-procedure vital signs reviewed and stable Respiratory status: spontaneous breathing, nonlabored ventilation, respiratory function stable and patient connected to nasal cannula oxygen Cardiovascular status: blood pressure returned to baseline and stable Postop Assessment: no apparent nausea or vomiting Anesthetic complications: no   No notable events documented.  Last Vitals:  Vitals:   03/15/23 1015 03/15/23 1030  BP: 110/69 108/70  Pulse: 75 76  Resp: 16 16  Temp:  37 C  SpO2: 100% 100%    Last Pain:  Vitals:   03/15/23 1030  TempSrc:   PainSc: 0-No pain                  

## 2023-03-18 LAB — SURGICAL PATHOLOGY

## 2023-03-25 LAB — ANORA MISCARRIAGE TEST - FRESH

## 2023-03-31 NOTE — Progress Notes (Unsigned)
   Subjective:   Patient Name: Frances [name], female   DOB: 22-Apr-1996, 27 y.o.  MRN: [id number]  HPI Seen for follow up of SAB with D&C 3 weeks ago. Denies bleeding, cramping. Menses has not returned yet. Does not plan on becoming pregnant soon.    Review of Systems Pertinent items noted in HPI and remainder of comprehensive ROS otherwise negative.     Objective:  BP 113/77   Pulse 80   Wt 195 lb 6.4 oz (88.6 kg)   LMP 12/22/2022   Breastfeeding Unknown   BMI 30.60 kg/m    Constitutional: Well-developed, well-nourished pregnant female in no acute distress.  HEENT: PERRLA Skin: normal color and turgor, no rash Cardiovascular: normal rate & rhythm, warm and well perfused Respiratory: normal effort, no problems with respiration noted GI: Abd soft, non-tender MS: Extremities nontender, no edema, normal ROM Neurologic: Alert and oriented x 4.   Assessment & Plan:  1. Miscarriage - Procedure went well, no complications, appreciated Dr. Lawana Chambers care and attention  - Patient counseled regarding future pregnancy plans and timing  - Contraception: condoms  Follow up PRN or for annual exam  Bernerd Limbo MSN, CNM 03/31/23  5:46 PM

## 2023-04-01 ENCOUNTER — Other Ambulatory Visit: Payer: Self-pay

## 2023-04-01 ENCOUNTER — Encounter: Payer: Self-pay | Admitting: Certified Nurse Midwife

## 2023-04-01 ENCOUNTER — Ambulatory Visit (INDEPENDENT_AMBULATORY_CARE_PROVIDER_SITE_OTHER): Payer: Medicaid Other | Admitting: Certified Nurse Midwife

## 2023-04-01 VITALS — BP 113/77 | HR 80 | Wt 195.4 lb

## 2023-04-01 DIAGNOSIS — O039 Complete or unspecified spontaneous abortion without complication: Secondary | ICD-10-CM

## 2023-04-01 DIAGNOSIS — Z3A11 11 weeks gestation of pregnancy: Secondary | ICD-10-CM | POA: Diagnosis not present

## 2023-04-06 ENCOUNTER — Encounter (HOSPITAL_BASED_OUTPATIENT_CLINIC_OR_DEPARTMENT_OTHER): Payer: Self-pay | Admitting: Emergency Medicine

## 2023-04-06 ENCOUNTER — Emergency Department (HOSPITAL_BASED_OUTPATIENT_CLINIC_OR_DEPARTMENT_OTHER): Payer: Medicaid Other

## 2023-04-06 ENCOUNTER — Emergency Department (HOSPITAL_BASED_OUTPATIENT_CLINIC_OR_DEPARTMENT_OTHER)
Admission: EM | Admit: 2023-04-06 | Discharge: 2023-04-06 | Disposition: A | Discharge status: Home / Self Care | Payer: Medicaid Other | Attending: Emergency Medicine | Admitting: Emergency Medicine

## 2023-04-06 DIAGNOSIS — R1032 Left lower quadrant pain: Secondary | ICD-10-CM | POA: Diagnosis not present

## 2023-04-06 DIAGNOSIS — O26891 Other specified pregnancy related conditions, first trimester: Secondary | ICD-10-CM | POA: Diagnosis not present

## 2023-04-06 DIAGNOSIS — R1030 Lower abdominal pain, unspecified: Secondary | ICD-10-CM | POA: Diagnosis present

## 2023-04-06 DIAGNOSIS — L739 Follicular disorder, unspecified: Secondary | ICD-10-CM | POA: Insufficient documentation

## 2023-04-06 DIAGNOSIS — Z3A01 Less than 8 weeks gestation of pregnancy: Secondary | ICD-10-CM | POA: Diagnosis not present

## 2023-04-06 DIAGNOSIS — N189 Chronic kidney disease, unspecified: Secondary | ICD-10-CM | POA: Diagnosis not present

## 2023-04-06 DIAGNOSIS — N854 Malposition of uterus: Secondary | ICD-10-CM | POA: Diagnosis not present

## 2023-04-06 LAB — COMPREHENSIVE METABOLIC PANEL
ALT: 11 U/L (ref 0–44)
AST: 14 U/L — ABNORMAL LOW (ref 15–41)
Albumin: 4.3 g/dL (ref 3.5–5.0)
Alkaline Phosphatase: 59 U/L (ref 38–126)
Anion gap: 8 (ref 5–15)
BUN: 11 mg/dL (ref 6–20)
CO2: 25 mmol/L (ref 22–32)
Calcium: 9 mg/dL (ref 8.9–10.3)
Chloride: 105 mmol/L (ref 98–111)
Creatinine, Ser: 0.56 mg/dL (ref 0.44–1.00)
GFR, Estimated: >60 mL/min (ref >60)
Glucose, Bld: 80 mg/dL (ref 70–99)
Potassium: 3.5 mmol/L (ref 3.5–5.1)
Sodium: 138 mmol/L (ref 135–145)
Total Bilirubin: 0.4 mg/dL (ref 0.3–1.2)
Total Protein: 7.2 g/dL (ref 6.5–8.1)

## 2023-04-06 LAB — URINALYSIS, ROUTINE W REFLEX MICROSCOPIC
Bilirubin Urine: NEGATIVE
Glucose, UA: NEGATIVE mg/dL
Hgb urine dipstick: NEGATIVE
Ketones, ur: NEGATIVE mg/dL
Leukocytes,Ua: NEGATIVE
Nitrite: NEGATIVE
Protein, ur: NEGATIVE mg/dL
Specific Gravity, Urine: 1.02 (ref 1.005–1.030)
pH: 6 (ref 5.0–8.0)

## 2023-04-06 LAB — PREGNANCY, URINE: Preg Test, Ur: NEGATIVE

## 2023-04-06 LAB — CBC
HCT: 35.5 % — ABNORMAL LOW (ref 36.0–46.0)
Hemoglobin: 12.1 g/dL (ref 12.0–15.0)
MCH: 29.8 pg (ref 26.0–34.0)
MCHC: 34.1 g/dL (ref 30.0–36.0)
MCV: 87.4 fL (ref 80.0–100.0)
Platelets: 331 10*3/uL (ref 150–400)
RBC: 4.06 MIL/uL (ref 3.87–5.11)
RDW: 12.2 % (ref 11.5–15.5)
WBC: 4.6 10*3/uL (ref 4.0–10.5)
nRBC: 0 % (ref 0.0–0.2)

## 2023-04-06 LAB — WET PREP, GENITAL
Clue Cells Wet Prep HPF POC: NONE SEEN
Sperm: NONE SEEN
Trich, Wet Prep: NONE SEEN
WBC, Wet Prep HPF POC: <10 (ref <10)
Yeast Wet Prep HPF POC: NONE SEEN

## 2023-04-06 LAB — HIV ANTIBODY (ROUTINE TESTING W REFLEX): HIV Screen 4th Generation wRfx: NONREACTIVE

## 2023-04-06 LAB — LIPASE, BLOOD: Lipase: 21 U/L (ref 11–51)

## 2023-04-06 MED ORDER — ONDANSETRON HCL 4 MG PO TABS
4.0000 mg | ORAL_TABLET | Freq: Three times a day (TID) | ORAL | 0 refills | Status: AC | PRN
Start: 2023-04-06 — End: 2023-04-11

## 2023-04-06 MED ORDER — DICYCLOMINE HCL 20 MG PO TABS: 20.0000 mg | ORAL_TABLET | Freq: Three times a day (TID) | ORAL | 0 refills | Status: DC | PRN

## 2023-04-06 NOTE — ED Provider Notes (Signed)
[place] EMERGENCY DEPARTMENT AT [facility] Provider Note   CSN: [id number] Arrival date & time: 04/06/23  1048     History  Chief Complaint  Patient presents with   Abdominal Pain    Frances [name] is a 26 y.o. female with past medical history asthma, CKD, seizure disorder who presents to the ED complaining of lower abdominal cramping worse on the left that started 3 days ago.  Patient recently had a miscarriage with a D&C earlier this month.  Believed to be around 8 weeks when she miscarried.  No vaginal bleeding or discharge.  Does note a new vaginal lesion that popped up a few days ago.  It is not painful.  No new sexual partners.  No nausea, vomiting, diarrhea, fever.  Pain was mild and has resolved by initial assessment.  No urinary symptoms.  Last bowel movement was this morning.  Before that, it was 3 days ago.  Patient states that she has constipation at baseline.  No history of SBO.       Home Medications Prior to Admission medications   Medication Sig Start Date End Date Taking? Authorizing Provider  dicyclomine (BENTYL) 20 MG tablet Take 1 tablet (20 mg total) by mouth 3 (three) times daily as needed for up to 5 days for spasms. 04/06/23 04/11/23 Yes Diesel Lina L, PA-C  ondansetron (ZOFRAN) 4 MG tablet Take 1 tablet (4 mg total) by mouth every 8 (eight) hours as needed for up to 5 days for nausea or vomiting. 04/06/23 04/11/23 Yes Odelle Kosier L, PA-C  Blood Pressure Monitoring (BLOOD PRESSURE KIT) DEVI 1 Device by Does not apply route as needed. Patient not taking: Reported on 04/01/2023 03/05/23   Bernerd Limbo, CNM  Misc. Devices (GOJJI WEIGHT SCALE) MISC 1 Device by Does not apply route once a week. Patient not taking: Reported on 03/12/2023 03/05/23   Bernerd Limbo, CNM  Multiple Vitamins-Minerals (MULTIVITAL PO) Take by mouth.    [provider]  omeprazole (PRILOSEC) 20 MG capsule Take 1 capsule (20 mg total) by mouth daily. Patient not  taking: No sig reported 04/16/20 09/28/20  Belinda Fisher, PA-C      Allergies    Vancomycin    Review of Systems   Review of Systems  All other systems reviewed and are negative.   Physical Exam Updated Vital Signs BP 114/84   Pulse 76   Temp 98.7 F (37.1 C) (Oral)   Resp 18   SpO2 100%   Breastfeeding No  Physical Exam Vitals and nursing note reviewed. Exam conducted with a chaperone present.  Constitutional:      General: She is not in acute distress.    Appearance: Normal appearance. She is not ill-appearing or toxic-appearing.  HENT:     Head: Normocephalic and atraumatic.     Mouth/Throat:     Mouth: Mucous membranes are moist.  Eyes:     Extraocular Movements: Extraocular movements intact.     Conjunctiva/sclera: Conjunctivae normal.     Pupils: Pupils are equal, round, and reactive to light.  Cardiovascular:     Rate and Rhythm: Normal rate and regular rhythm.     Heart sounds: No murmur heard. Pulmonary:     Effort: Pulmonary effort is normal.     Breath sounds: Normal breath sounds.  Abdominal:     General: Abdomen is flat. There is no distension.     Palpations: Abdomen is soft.     Tenderness: There is no abdominal  tenderness. There is no right CVA tenderness, left CVA tenderness, guarding or rebound. Negative signs include Murphy's sign, Rovsing's sign and McBurney's sign.  Genitourinary:    Vagina: No vaginal discharge.     Cervix: No cervical motion tenderness.     Adnexa: Right adnexa normal and left adnexa normal.       Right: No mass, tenderness or fullness.         Left: No mass, tenderness or fullness.       Comments: Cortney, primary RN, as chaperone, round slightly raised lesion to the right labia majora that is mildly indurated, no fluctuance, no erythema or warmth Musculoskeletal:        General: Normal range of motion.     Cervical back: Neck supple.     Right lower leg: No edema.     Left lower leg: No edema.  Lymphadenopathy:     Lower Body:  No right inguinal adenopathy. No left inguinal adenopathy.  Skin:    General: Skin is warm and dry.     Capillary Refill: Capillary refill takes less than 2 seconds.  Neurological:     Mental Status: She is alert. Mental status is at baseline.  Psychiatric:        Behavior: Behavior normal.     ED Results / Procedures / Treatments   Labs (all labs ordered are listed, but only abnormal results are displayed) Labs Reviewed  COMPREHENSIVE METABOLIC PANEL - Abnormal; Notable for the following components:      Result Value   AST 14 (*)    All other components within normal limits  CBC - Abnormal; Notable for the following components:   HCT 35.5 (*)    All other components within normal limits  WET PREP, GENITAL  LIPASE, BLOOD  URINALYSIS, ROUTINE W REFLEX MICROSCOPIC  PREGNANCY, URINE  HIV ANTIBODY (ROUTINE TESTING W REFLEX)  RPR  GC/CHLAMYDIA PROBE AMP ([place]) NOT AT [facility]    EKG None  Radiology US PELVIC COMPLETE W TRANSVAGINAL AND TORSION R/O  Result Date: 04/06/2023 CLINICAL DATA:  Left lower quadrant cramping for 3 days EXAM: TRANSABDOMINAL AND TRANSVAGINAL ULTRASOUND OF PELVIS TECHNIQUE: Both transabdominal and transvaginal ultrasound examinations of the pelvis were performed. Transabdominal technique was performed for global imaging of the pelvis including uterus, ovaries, adnexal regions, and pelvic cul-de-sac. It was necessary to proceed with endovaginal exam following the transabdominal exam to visualize the endometrium and adnexa. COMPARISON:  None Available. FINDINGS: Uterus Measurements: 8.4 x 5.0 x 6.1 cm = volume: 133.3 mL. Retroverted uterus. Endometrium Thickness: 11 mm.  No focal abnormality visualized. Right ovary Measurements: 2.9 x 1.9 x 1.7 cm = volume: 4.6 mL. Normal appearance/no adnexal mass. Left ovary Measurements: 3.2 x 1.7 x 1.6 cm = volume: 4.5 mL. Normal appearance/no adnexal mass. Other findings Small amount of free fluid. IMPRESSION: Retroverted  uterus with a small amount of free fluid Electronically Signed   By: Karen Kays M.D.   On: 04/06/2023 13:43    Procedures Procedures    Medications Ordered in ED Medications - No data to display  ED Course/ Medical Decision Making/ A&P                                Medical Decision Making Amount and/or Complexity of Data Reviewed Labs: ordered. Decision-making details documented in ED Course. Radiology: ordered. Decision-making details documented in ED Course. ECG/medicine tests: ordered. Decision-making details documented in ED  Course.  Risk Prescription drug management.   Medical Decision Making:   Zanique Morgenthaler is a 27 y.o. female who presented to the ED today with abdominal pain detailed above.    Patient's presentation is complicated by their history of recent miscarriage, recent D&C.  Complete initial physical exam performed, notably the patient  was in no acute distress, nontoxic-appearing.  Abdomen soft and nontender.  No distention.  No CVA tenderness.    Reviewed and confirmed nursing documentation for past medical history, family history, social history.    Initial Assessment:   With the patient's presentation of abdominal pain, differential diagnosis includes but is not limited to AAA, mesenteric ischemia, appendicitis, diverticulitis, DKA, gastritis, gastroenteritis, AMI, nephrolithiasis, pancreatitis, peritonitis, adrenal insufficiency, intestinal ischemia, constipation, UTI, SBO/LBO, splenic rupture, biliary disease, IBD, IBS, PUD, hepatitis, STD, ovarian/testicular torsion, electrolyte disturbance, DKA, dehydration, acute kidney injury, renal failure, cholecystitis, cholelithiasis, choledocholithiasis, abdominal pain of  unknown etiology, pregnancy, incomplete abortion, septic abortion, threatened abortion, ectopic pregnancy, PID.   Initial Plan:  Screening labs including CBC and Metabolic panel to evaluate for infectious or metabolic etiology of disease.  Lipase  to evaluate for pancreatitis Urinalysis with reflex culture ordered to evaluate for UTI or relevant urologic/nephrologic pathology.  Pelvic ultrasound to assess for intra-abdominal pathology  Symptomatic management, declined by patient Objective evaluation as reviewed   Initial Study Results:   Laboratory  All laboratory results reviewed without evidence of clinically relevant pathology.    Radiology:  All images reviewed independently. Agree with radiology report at this time.   US PELVIC COMPLETE W TRANSVAGINAL AND TORSION R/O  Result Date: 04/06/2023 CLINICAL DATA:  Left lower quadrant cramping for 3 days EXAM: TRANSABDOMINAL AND TRANSVAGINAL ULTRASOUND OF PELVIS TECHNIQUE: Both transabdominal and transvaginal ultrasound examinations of the pelvis were performed. Transabdominal technique was performed for global imaging of the pelvis including uterus, ovaries, adnexal regions, and pelvic cul-de-sac. It was necessary to proceed with endovaginal exam following the transabdominal exam to visualize the endometrium and adnexa. COMPARISON:  None Available. FINDINGS: Uterus Measurements: 8.4 x 5.0 x 6.1 cm = volume: 133.3 mL. Retroverted uterus. Endometrium Thickness: 11 mm.  No focal abnormality visualized. Right ovary Measurements: 2.9 x 1.9 x 1.7 cm = volume: 4.6 mL. Normal appearance/no adnexal mass. Left ovary Measurements: 3.2 x 1.7 x 1.6 cm = volume: 4.5 mL. Normal appearance/no adnexal mass. Other findings Small amount of free fluid. IMPRESSION: Retroverted uterus with a small amount of free fluid Electronically Signed   By: Karen Kays M.D.   On: 04/06/2023 13:43   US OB LESS THAN 14 WEEKS WITH OB TRANSVAGINAL  Result Date: 03/13/2023 ----------------------------------------------------------------------  OBSTETRICS REPORT                       (Signed Final 03/13/2023 10:44 am) ---------------------------------------------------------------------- Patient Info  ID #:       [id number]                           D.O.B.:  13-Jun-1996 (26 yrs)  Name:       Tillman Sers Youngblood               Visit Date: 03/12/2023 12:20 pm ---------------------------------------------------------------------- Performed By  Attending:        Mariel Aloe MD       Ref. Address:     [address]  [place], [place]                                                             [address]  Performed By:     Otilio Jefferson BA       [facility]  Referred By:      [facility]          Visit Type:       OB                    for Women ---------------------------------------------------------------------- Orders  #  Description                           Code        Ordered By  1  US OB LESS THAN 14 WEEKS              [id number]     Edd Arbour     WITH OB TRANSVAGINAL ----------------------------------------------------------------------  #  Order #                     Accession #                Episode #  1  [id number]                   [id number]                 [id number] ---------------------------------------------------------------------- Indications  [redacted] weeks gestation of pregnancy                Z3A.11  Pregnancy with inconclusive fetal viability    O36.80X0 ---------------------------------------------------------------------- Fetal Evaluation  Num Of Fetuses:         1  Preg. Location:  Intrauterine  Gest. Sac:              Intrauterine  Yolk Sac:               Not visualized  Fetal Pole:             Visualized  Fetal Heart Rate(bpm):  0  Cardiac Activity:       Absent  Comment:    No fetal heart motion or yolk sac noted  ---------------------------------------------------------------------- Biometry  GS:         49  mm     G. Age:  80w 3d                  EDD:   09/28/23  CRL:        21  mm     G. Age:  8w 4d                   EDD:   10/18/23 ---------------------------------------------------------------------- Gestational Age  Best:          11w 3d     Det. By:  U/S G S (03/12/23)       EDD:   09/28/23 ----------------------------------------------------------------------                Mariel Aloe, MD Electronically Signed Final Report   03/13/2023 10:44 am ----------------------------------------------------------------------      Final Assessment and Plan:   27 year old female presents to the ED with a vaginal lesion as well as lower abdominal pain.  Abdomen soft and nontender.  By my initial exam, pain has mostly resolved and is minimal.  No peritoneal signs.  No CVA tenderness.  Afebrile, nontoxic-appearing, vital signs reassuring.  Abdominal pain labs essentially unremarkable.  No UTI.  Normal kidney, liver function.  No electrolyte disturbance.  No leukocytosis.  Patient did recently unfortunately have a miscarriage.  She had a follow-up with her OB/GYN earlier this week with a reassuring exam.  She is not having any more vaginal bleeding or discharge.  With recent miscarriage, will obtain repeat imaging to rule out complication from recent miscarriage and D&C.  Ultrasound with trace pelvic free fluid.  Suspect physiologic.  On pelvic exam, patient has no cervical motion tenderness, no adnexal tenderness or fullness.  No signs of torsion on ultrasound.  No new sexual partners.  Low suspicion for PID.  She does no new lesions to the labia.  She does frequently shave.  This appears more consistent with an ingrown hair on exam.  No surrounding cellulitic changes.  Will have her trial warm compresses at home and follow-up for recheck if no improvement.  No tenderness on serial abdominal exams.  No McBurney's point tenderness,  no Murphy sign.  Low suspicion for acute abdomen so we will hold on CT scan today and have patient return for acutely worsening symptoms.  Also discussed risk/benefits of sending out STD panel and patient agreeable to this.  Aware that she will need to follow-up on results via MyChart.  Overall reassuring workup and exam.  Patient given strict ED return precautions and will return as needed.  All questions answered and stable for discharge.   Clinical Impression:  1. Folliculitis   2. Lower abdominal pain      Discharge           Final Clinical Impression(s) / ED Diagnoses Final diagnoses:  Folliculitis  Lower abdominal pain    Rx / DC Orders ED Discharge Orders  Ordered    dicyclomine (BENTYL) 20 MG tablet  3 times daily PRN        04/06/23 1459    ondansetron (ZOFRAN) 4 MG tablet  Every 8 hours PRN        04/06/23 1459              Tonette Lederer, PA-C 04/06/23 1507    Ernie Avena, MD 04/06/23 1520    Ernie Avena, MD 04/07/23 [phone]

## 2023-04-06 NOTE — ED Triage Notes (Signed)
Abdominal pain, vaginal pain.(Superpubic) Started 3days  ago. D&c on 03/15/2023 Denies n/v/d, no blood

## 2023-04-06 NOTE — Discharge Instructions (Signed)
Thank you for letting us take care of you today.  Your labs were normal.  The vaginal swabs and test for syphilis and HIV will not result today.  Please follow-up on these via MyChart and return for treatment if needed.  The lesion that you noted to your labia looks more like an ingrown hair to me.  Use warm compresses on this.  I recommend doing so for about 20 minutes every 2-3 hours.  It does not look like it is infected today but if it becomes really red, warm, or you develop a fever you should be reevaluated as you may need antibiotics or for the area to be drained at that time.  Follow-up with your PCP or OB/GYN for any continued abdominal pain.  The ultrasound did not show any retained products in your uterus or complications with your ovaries or in your pelvis otherwise.  Your pregnancy hormone is back to normal.  Your urine did not appear infected either.  For new or worsening symptoms, return to the nearest ED for reevaluation.

## 2023-04-07 LAB — GC/CHLAMYDIA PROBE AMP (~~LOC~~) NOT AT ARMC
Chlamydia: NEGATIVE
Comment: NEGATIVE
Comment: NORMAL
Neisseria Gonorrhea: NEGATIVE

## 2023-04-07 LAB — RPR: RPR Ser Ql: NONREACTIVE

## 2023-04-09 ENCOUNTER — Encounter: Payer: Medicaid Other | Admitting: Obstetrics & Gynecology

## 2023-04-17 ENCOUNTER — Telehealth: Payer: Medicaid Other | Admitting: Physician Assistant

## 2023-04-17 DIAGNOSIS — R3989 Other symptoms and signs involving the genitourinary system: Secondary | ICD-10-CM

## 2023-04-17 MED ORDER — CEPHALEXIN 500 MG PO CAPS: 500.0000 mg | ORAL_CAPSULE | Freq: Two times a day (BID) | ORAL | 0 refills | Status: AC

## 2023-04-17 NOTE — Progress Notes (Signed)
Virtual Visit Consent   Frances [name], you are scheduled for a virtual visit with a [facility] provider today. Just as with appointments in the office, your consent must be obtained to participate. Your consent will be active for this visit and any virtual visit you may have with one of our providers in the next 365 days. If you have a MyChart account, a copy of this consent can be sent to you electronically.  As this is a virtual visit, video technology does not allow for your provider to perform a traditional examination. This may limit your provider's ability to fully assess your condition. If your provider identifies any concerns that need to be evaluated in person or the need to arrange testing (such as labs, EKG, etc.), we will make arrangements to do so. Although advances in technology are sophisticated, we cannot ensure that it will always work on either your end or our end. If the connection with a video visit is poor, the visit may have to be switched to a telephone visit. With either a video or telephone visit, we are not always able to ensure that we have a secure connection.  By engaging in this virtual visit, you consent to the provision of healthcare and authorize for your insurance to be billed (if applicable) for the services provided during this visit. Depending on your insurance coverage, you may receive a charge related to this service.  I need to obtain your verbal consent now. Are you willing to proceed with your visit today? Frances [name] has provided verbal consent on 04/17/2023 for a virtual visit (video or telephone). Piedad Climes, [place]  Date: 04/17/2023 7:46 AM  Virtual Visit via Video Note   I, Piedad Climes, connected with  Frances [name]  ([id number], [birth year]) on 04/17/23 at  7:45 AM EDT by a video-enabled telemedicine application and verified that I am speaking with the correct person using two identifiers.  Location: Patient: Virtual Visit Location  Patient: Mobile Provider: Virtual Visit Location Provider: Home Office   I discussed the limitations of evaluation and management by telemedicine and the availability of in person appointments. The patient expressed understanding and agreed to proceed.    History of Present Illness: Frances [name] is a 27 y.o. who identifies as a female who was assigned female at birth, and is being seen today for possible UTI. Endorses 2 days of urinary urgency, frequency, hesitancy. Denies hematuria. Denies fever, chills, vomiting. Denies back or belly pain. Recent d/c.  HPI: HPI  Problems:  Patient Active Problem List   Diagnosis Date Noted   Missed abortion 03/15/2023   Nonintractable headache 11/20/2022   History of asthma 08/18/2018   History of seizure disorder 08/18/2018   History of kidney disease as a child 08/18/2018   History of recurrent UTIs 09/16/2016    Allergies:  Allergies  Allergen Reactions   Vancomycin Other (See Comments)    Nephrotoxicity   Medications:  Current Outpatient Medications:    Blood Pressure Monitoring (BLOOD PRESSURE KIT) DEVI, 1 Device by Does not apply route as needed. (Patient not taking: Reported on 04/01/2023), Disp: 1 each, Rfl: 0   Misc. Devices (GOJJI WEIGHT SCALE) MISC, 1 Device by Does not apply route once a week. (Patient not taking: Reported on 03/12/2023), Disp: 1 each, Rfl: 0   Multiple Vitamins-Minerals (MULTIVITAL PO), Take by mouth., Disp: , Rfl:   Observations/Objective: Patient is well-developed, well-nourished in no acute distress.  Resting comfortably in parked car.  Head is normocephalic,  atraumatic.  No labored breathing. Speech is clear and coherent with logical content.  Patient is alert and oriented at baseline.   Assessment and Plan: 1. Suspected UTI  Classic UTI symptoms with absence of alarm signs or symptoms. Prior history of UTI. Will treat empirically with Keflex for suspected uncomplicated cystitis. Supportive measures and  OTC medications reviewed. Strict in-person evaluation precautions discussed.    Follow Up Instructions: I discussed the assessment and treatment plan with the patient. The patient was provided an opportunity to ask questions and all were answered. The patient agreed with the plan and demonstrated an understanding of the instructions.  A copy of instructions were sent to the patient via MyChart unless otherwise noted below.   The patient was advised to call back or seek an in-person evaluation if the symptoms worsen or if the condition fails to improve as anticipated.  Time:  I spent 10 minutes with the patient via telehealth technology discussing the above problems/concerns.    Piedad Climes, PA-C

## 2023-04-17 NOTE — Patient Instructions (Signed)
[name], thank you for joining Frances Climes, PA-C for today's virtual visit.  While this provider is not your primary care provider (PCP), if your PCP is located in our provider database this encounter information will be shared with them immediately following your visit.   A [facility] MyChart account gives you access to today's visit and all your visits, tests, and labs performed at [facility] " click here if you don't have a [facility] MyChart account or go to [email or web address]  Consent: (Patient) Frances [name] provided verbal consent for this virtual visit at the beginning of the encounter.  Current Medications:  Current Outpatient Medications:    Blood Pressure Monitoring (BLOOD PRESSURE KIT) DEVI, 1 Device by Does not apply route as needed. (Patient not taking: Reported on 04/01/2023), Disp: 1 each, Rfl: 0   dicyclomine (BENTYL) 20 MG tablet, Take 1 tablet (20 mg total) by mouth 3 (three) times daily as needed for up to 5 days for spasms., Disp: 15 tablet, Rfl: 0   Misc. Devices (GOJJI WEIGHT SCALE) MISC, 1 Device by Does not apply route once a week. (Patient not taking: Reported on 03/12/2023), Disp: 1 each, Rfl: 0   Multiple Vitamins-Minerals (MULTIVITAL PO), Take by mouth., Disp: , Rfl:    Medications ordered in this encounter:  No orders of the defined types were placed in this encounter.    *If you need refills on other medications prior to your next appointment, please contact your pharmacy*  Follow-Up: Call back or seek an in-person evaluation if the symptoms worsen or if the condition fails to improve as anticipated.  [facility] [phone]  Other Instructions Your symptoms are consistent with a bladder infection, also called acute cystitis. Please take your antibiotic (Keflex) as directed until all pills are gone.  Stay very well hydrated.  Consider a daily probiotic (Align, Culturelle, or Activia) to help prevent  stomach upset caused by the antibiotic.  Taking a probiotic daily may also help prevent recurrent UTIs.  Also consider taking AZO (Phenazopyridine) tablets to help decrease pain with urination.  I will call you with your urine testing results.  We will change antibiotics if indicated.  Call or return to clinic if symptoms are not resolved by completion of antibiotic.   Urinary Tract Infection A urinary tract infection (UTI) can occur any place along the urinary tract. The tract includes the kidneys, ureters, bladder, and urethra. A type of germ called bacteria often causes a UTI. UTIs are often helped with antibiotic medicine.  HOME CARE  If given, take antibiotics as told by your doctor. Finish them even if you start to feel better. Drink enough fluids to keep your pee (urine) clear or pale yellow. Avoid tea, drinks with caffeine, and bubbly (carbonated) drinks. Pee often. Avoid holding your pee in for a long time. Pee before and after having sex (intercourse). Wipe from front to back after you poop (bowel movement) if you are a woman. Use each tissue only once. GET HELP RIGHT AWAY IF:  You have back pain. You have lower belly (abdominal) pain. You have chills. You feel sick to your stomach (nauseous). You throw up (vomit). Your burning or discomfort with peeing does not go away. You have a fever. Your symptoms are not better in 3 days. MAKE SURE YOU:  Understand these instructions. Will watch your condition. Will get help right away if you are not doing well or get worse. Document Released: 01/15/2008 Document Revised: 04/22/2012 Document Reviewed:  02/27/2012 ExitCare Patient Information 2015 [place], [place]. This information is not intended to replace advice given to you by your health care provider. Make sure you discuss any questions you have with your health care provider.    If you have been instructed to have an in-person evaluation today at a local Urgent Care facility, please  use the link below. It will take you to a list of all of our available [facility], including address, phone number and hours of operation. Please do not delay care.  [facility]  If you or a family member do not have a primary care provider, use the link below to schedule a visit and establish care. When you choose a [facility] primary care physician or advanced practice provider, you gain a long-term partner in health. Find a Primary Care Provider  Learn more about [facility]'s in-office and virtual care options: [facility] - Get Care Now

## 2023-04-17 NOTE — Addendum Note (Signed)
Addended by: Waldon Merl on: 04/17/2023 05:42 PM   Modules accepted: Orders

## 2023-05-05 ENCOUNTER — Ambulatory Visit: Payer: Medicaid Other

## 2023-05-09 ENCOUNTER — Other Ambulatory Visit: Payer: Self-pay

## 2023-05-09 ENCOUNTER — Ambulatory Visit
Admission: RE | Admit: 2023-05-09 | Discharge: 2023-05-09 | Disposition: A | Discharge status: Home / Self Care | Payer: Medicaid Other | Source: Ambulatory Visit | Attending: Physician Assistant | Admitting: Physician Assistant

## 2023-05-09 VITALS — BP 113/79 | HR 79 | Temp 98.6°F | Resp 16

## 2023-05-09 DIAGNOSIS — N3000 Acute cystitis without hematuria: Secondary | ICD-10-CM | POA: Insufficient documentation

## 2023-05-09 LAB — POCT URINALYSIS DIP (MANUAL ENTRY)
Bilirubin, UA: NEGATIVE
Glucose, UA: NEGATIVE mg/dL
Ketones, POC UA: NEGATIVE mg/dL
Nitrite, UA: POSITIVE — AB
Protein Ur, POC: NEGATIVE mg/dL
Spec Grav, UA: 1.025 (ref 1.010–1.025)
Urobilinogen, UA: 0.2 U/dL
pH, UA: 7 (ref 5.0–8.0)

## 2023-05-09 LAB — POCT URINE PREGNANCY: Preg Test, Ur: NEGATIVE

## 2023-05-09 MED ORDER — CIPROFLOXACIN HCL 500 MG PO TABS: 500.0000 mg | ORAL_TABLET | Freq: Two times a day (BID) | ORAL | 0 refills | Status: DC

## 2023-05-09 NOTE — ED Triage Notes (Signed)
Pt reports urinary frequency and dysuria since Tuesday

## 2023-05-09 NOTE — ED Provider Notes (Signed)
[name]    CSN: [id number] Arrival date & time: 05/09/23  1730      History   Chief Complaint Chief Complaint  Patient presents with   Urinary Frequency    Entered by patient    HPI Frances [name] is a 27 y.o. female.   Here today for evaluation of urinary frequency and dysuria that she has had the last few days.  She denies any vomiting or abdominal pain.  She has not had any back pain or fever.  She does not report treatment for symptoms.  The history is provided by the patient.  Urinary Frequency Pertinent negatives include no abdominal pain.    Past Medical History:  Diagnosis Date   Anemia    on Iron supplements   Asthma 1999   as a child; no current problems   Chronic kidney disease    as a child   Constipation    Gall stones    Gallstones 10/23/2016   10/2016: Seen by [facility], pt states, and plans for PP follow up. Advised her on low fat diet   Seizures (HCC)    seizure disorder as a child, treated with medication; none since   UTI (urinary tract infection)     Patient Active Problem List   Diagnosis Date Noted   Missed abortion 03/15/2023   Nonintractable headache 11/20/2022   History of asthma 08/18/2018   History of seizure disorder 08/18/2018   History of kidney disease as a child 08/18/2018   History of recurrent UTIs 09/16/2016    Past Surgical History:  Procedure Laterality Date   BACK SURGERY     DILATION AND EVACUATION N/A 03/15/2023   Procedure: DILATATION AND EVACUATION;  Surgeon: Myna Hidalgo, DO;  Location: [facility];  Service: Gynecology;  Laterality: N/A;   IRRIGATION AND DEBRIDEMENT ABSCESS Left 06/11/2017   Procedure: IRRIGATION, DRAINAGE AND DEBRIDEMENT LEFT FLANK NECROTIZING SOFT TISSUE INFECTION;  Surgeon: Avel Peace, MD;  Location: [facility];  Service: General;  Laterality: Left;   OPERATIVE ULTRASOUND N/A 03/15/2023   Procedure: OPERATIVE ULTRASOUND;  Surgeon: Myna Hidalgo, DO;  Location: [facility];  Service:  Gynecology;  Laterality: N/A;   TYMPANOSTOMY TUBE PLACEMENT     WOUND DEBRIDEMENT Left 06/12/2017   Procedure: DRESSING CHANGE AND DEBRIDEMENT OF WOUND;  Surgeon: Avel Peace, MD;  Location: [facility];  Service: General;  Laterality: Left;    OB History     Gravida  4   Para  3   Term  3   Preterm  0   AB  0   Living  3      SAB  0   IAB  0   Ectopic  0   Multiple  0   Live Births  3            Home Medications    Prior to Admission medications   Medication Sig Start Date End Date Taking? Authorizing Provider  ciprofloxacin (CIPRO) 500 MG tablet Take 1 tablet (500 mg total) by mouth every 12 (twelve) hours. 05/09/23  Yes Tomi Bamberger, PA-C  Blood Pressure Monitoring (BLOOD PRESSURE KIT) DEVI 1 Device by Does not apply route as needed. Patient not taking: Reported on 04/01/2023 03/05/23   Bernerd Limbo, CNM  Misc. Devices (GOJJI WEIGHT SCALE) MISC 1 Device by Does not apply route once a week. Patient not taking: Reported on 03/12/2023 03/05/23   Bernerd Limbo, CNM  Multiple Vitamins-Minerals (MULTIVITAL PO) Take by mouth.  [provider]  omeprazole (PRILOSEC) 20 MG capsule Take 1 capsule (20 mg total) by mouth daily. Patient not taking: No sig reported 04/16/20 09/28/20  Belinda Fisher, PA-C    Family History Family History  Problem Relation Age of Onset   Heart disease Mother        ? arrythmia   Asthma Mother    Hypertension Mother    Intellectual disability Father    Asthma Sister    Birth defects Sister        Down's syndrome    Social History Social History   Tobacco Use   Smoking status: Never   Smokeless tobacco: Never  Vaping Use   Vaping status: Never Used  Substance Use Topics   Alcohol use: No   Drug use: Not Currently    Types: Marijuana    Comment: last used 05/2018     Allergies   Vancomycin   Review of Systems Review of Systems  Constitutional:  Negative for chills and fever.  Eyes:  Negative for  discharge and redness.  Gastrointestinal:  Negative for abdominal pain, nausea and vomiting.  Genitourinary:  Positive for dysuria and frequency.     Physical Exam Triage Vital Signs ED Triage Vitals  Encounter Vitals Group     BP 05/09/23 1747 113/79     Systolic BP Percentile --      Diastolic BP Percentile --      Pulse Rate 05/09/23 1747 79     Resp 05/09/23 1747 16     Temp 05/09/23 1747 98.6 F (37 C)     Temp Source 05/09/23 1747 Oral     SpO2 05/09/23 1747 99 %     Weight --      Height --      Head Circumference --      Peak Flow --      Pain Score 05/09/23 1745 5     Pain Loc --      Pain Education --      Exclude from Growth Chart --    No data found.  Updated Vital Signs BP 113/79 (BP Location: Left Arm)   Pulse 79   Temp 98.6 F (37 C) (Oral)   Resp 16   LMP 04/20/2023 (Exact Date)   SpO2 99%   Breastfeeding No     Physical Exam Vitals and nursing note reviewed.  Constitutional:      General: She is not in acute distress.    Appearance: Normal appearance. She is not ill-appearing.  HENT:     Head: Normocephalic and atraumatic.  Eyes:     Conjunctiva/sclera: Conjunctivae normal.  Cardiovascular:     Rate and Rhythm: Normal rate.  Pulmonary:     Effort: Pulmonary effort is normal. No respiratory distress.  Neurological:     Mental Status: She is alert.  Psychiatric:        Mood and Affect: Mood normal.        Behavior: Behavior normal.        Thought Content: Thought content normal.      UC Treatments / Results  Labs (all labs ordered are listed, but only abnormal results are displayed) Labs Reviewed  URINE CULTURE - Abnormal; Notable for the following components:      Result Value   Culture >=100,000 COLONIES/mL ESCHERICHIA COLI (*)    Organism ID, Bacteria ESCHERICHIA COLI (*)    All other components within normal limits  POCT URINALYSIS DIP (MANUAL ENTRY) - Abnormal;  Notable for the following components:   Clarity, UA cloudy (*)     Blood, UA trace-intact (*)    Nitrite, UA Positive (*)    Leukocytes, UA Large (3+) (*)    All other components within normal limits  POCT URINE PREGNANCY    EKG   Radiology No results found.  Procedures Procedures (including critical care time)  Medications Ordered in UC Medications - No data to display  Initial Impression / Assessment and Plan / UC Course  I have reviewed the triage vital signs and the nursing notes.  Pertinent labs & imaging results that were available during my care of the patient were reviewed by me and considered in my medical decision making (see chart for details).    Cipro prescribed for UTI.  Urine culture ordered.  Recommended follow-up if no gradual improvement with any further concerns.  Final Clinical Impressions(s) / UC Diagnoses   Final diagnoses:  Acute cystitis without hematuria   Discharge Instructions   None    ED Prescriptions     Medication Sig Dispense Auth. Provider   ciprofloxacin (CIPRO) 500 MG tablet Take 1 tablet (500 mg total) by mouth every 12 (twelve) hours. 10 tablet Tomi Bamberger, PA-C      PDMP not reviewed this encounter.   Tomi Bamberger, PA-C 05/13/23 [phone]

## 2023-05-11 LAB — URINE CULTURE: Culture: >=100000 — AB

## 2023-05-23 DIAGNOSIS — M545 Low back pain, unspecified: Secondary | ICD-10-CM | POA: Diagnosis not present

## 2023-05-23 DIAGNOSIS — Z3202 Encounter for pregnancy test, result negative: Secondary | ICD-10-CM | POA: Diagnosis not present

## 2023-05-23 DIAGNOSIS — Z87440 Personal history of urinary (tract) infections: Secondary | ICD-10-CM | POA: Diagnosis not present

## 2023-05-23 DIAGNOSIS — R1030 Lower abdominal pain, unspecified: Secondary | ICD-10-CM | POA: Diagnosis not present

## 2023-06-22 ENCOUNTER — Inpatient Hospital Stay (HOSPITAL_COMMUNITY)
Admission: AD | Admit: 2023-06-22 | Discharge: 2023-06-22 | Disposition: A | Discharge status: Home / Self Care | Payer: Medicaid Other | Attending: Family Medicine | Admitting: Family Medicine

## 2023-06-22 ENCOUNTER — Other Ambulatory Visit: Payer: Self-pay

## 2023-06-22 ENCOUNTER — Inpatient Hospital Stay (HOSPITAL_COMMUNITY): Payer: Medicaid Other

## 2023-06-22 ENCOUNTER — Encounter (HOSPITAL_COMMUNITY): Payer: Self-pay

## 2023-06-22 DIAGNOSIS — O3680X0 Pregnancy with inconclusive fetal viability, not applicable or unspecified: Secondary | ICD-10-CM | POA: Diagnosis not present

## 2023-06-22 DIAGNOSIS — N3000 Acute cystitis without hematuria: Secondary | ICD-10-CM | POA: Insufficient documentation

## 2023-06-22 DIAGNOSIS — O2311 Infections of bladder in pregnancy, first trimester: Secondary | ICD-10-CM | POA: Diagnosis not present

## 2023-06-22 DIAGNOSIS — Z3A01 Less than 8 weeks gestation of pregnancy: Secondary | ICD-10-CM | POA: Diagnosis not present

## 2023-06-22 DIAGNOSIS — R1030 Lower abdominal pain, unspecified: Secondary | ICD-10-CM | POA: Diagnosis present

## 2023-06-22 LAB — CBC
HCT: 33.2 % — ABNORMAL LOW (ref 36.0–46.0)
Hemoglobin: 11.2 g/dL — ABNORMAL LOW (ref 12.0–15.0)
MCH: 29.9 pg (ref 26.0–34.0)
MCHC: 33.7 g/dL (ref 30.0–36.0)
MCV: 88.8 fL (ref 80.0–100.0)
Platelets: 303 10*3/uL (ref 150–400)
RBC: 3.74 MIL/uL — ABNORMAL LOW (ref 3.87–5.11)
RDW: 12.2 % (ref 11.5–15.5)
WBC: 6.6 10*3/uL (ref 4.0–10.5)
nRBC: 0 % (ref 0.0–0.2)

## 2023-06-22 LAB — ABO/RH: ABO/RH(D): A POS

## 2023-06-22 LAB — COMPREHENSIVE METABOLIC PANEL
ALT: 20 U/L (ref 0–44)
AST: 19 U/L (ref 15–41)
Albumin: 3.7 g/dL (ref 3.5–5.0)
Alkaline Phosphatase: 49 U/L (ref 38–126)
Anion gap: 7 (ref 5–15)
BUN: 10 mg/dL (ref 6–20)
CO2: 21 mmol/L — ABNORMAL LOW (ref 22–32)
Calcium: 9 mg/dL (ref 8.9–10.3)
Chloride: 104 mmol/L (ref 98–111)
Creatinine, Ser: 0.55 mg/dL (ref 0.44–1.00)
GFR, Estimated: >60 mL/min (ref >60)
Glucose, Bld: 88 mg/dL (ref 70–99)
Potassium: 3.7 mmol/L (ref 3.5–5.1)
Sodium: 132 mmol/L — ABNORMAL LOW (ref 135–145)
Total Bilirubin: 0.3 mg/dL (ref <1.2)
Total Protein: 7.1 g/dL (ref 6.5–8.1)

## 2023-06-22 LAB — URINALYSIS, ROUTINE W REFLEX MICROSCOPIC
Bilirubin Urine: NEGATIVE
Glucose, UA: NEGATIVE mg/dL
Hgb urine dipstick: NEGATIVE
Ketones, ur: NEGATIVE mg/dL
Nitrite: NEGATIVE
Protein, ur: NEGATIVE mg/dL
Specific Gravity, Urine: 1.028 (ref 1.005–1.030)
pH: 5 (ref 5.0–8.0)

## 2023-06-22 LAB — WET PREP, GENITAL
Clue Cells Wet Prep HPF POC: NONE SEEN
Sperm: NONE SEEN
Trich, Wet Prep: NONE SEEN
WBC, Wet Prep HPF POC: <10 (ref <10)
Yeast Wet Prep HPF POC: NONE SEEN

## 2023-06-22 LAB — HCG, QUANTITATIVE, PREGNANCY: hCG, Beta Chain, Quant, S: 74 m[IU]/mL — ABNORMAL HIGH (ref <5)

## 2023-06-22 LAB — POCT PREGNANCY, URINE: Preg Test, Ur: POSITIVE — AB

## 2023-06-22 MED ORDER — CEPHALEXIN 500 MG PO CAPS
500.0000 mg | ORAL_CAPSULE | Freq: Three times a day (TID) | ORAL | 0 refills | Status: DC
Start: 2023-06-22 — End: 2023-08-11

## 2023-06-22 NOTE — MAU Note (Signed)
.  Frances [name] is a 27 y.o. at [redacted]w[redacted]d here in MAU reporting: Pt reports lower abd cramping for the past 3 days, states she had a pos hpt yesterday.  Denies bleeding, but states she is concerned due to miscarriage in July 2024.  Denies changes to dc, denies urinary complaints  Onset of complaint: 3 days Pain score: 7 Vitals:   06/22/23 0841  BP: 128/81  Pulse: 85  Resp: 16  Temp: 98.4 F (36.9 C)  SpO2: 100%   Lab orders placed from triage:   ua

## 2023-06-22 NOTE — Discharge Instructions (Signed)
Please follow-up at [facility] for repeat HCG on Tuesday and ultrasound in 10-14 days. You should receive a call from the office to get these scheduled Monday.

## 2023-06-22 NOTE — MAU Provider Note (Signed)
Chief Complaint: Abdominal Pain   Provider seen 1133     SUBJECTIVE HPI: Frances [name] is a 27 y.o. G5P3003 at [redacted]w[redacted]d by LMP who presents to maternity admissions reporting abdominal cramping.  Patient notes feeling mild cramping in lower abdomen past 3 days. Positive home UPT yesterday. History of miscarriage. Denies change in vaginal discharge, VB, fever/chills. Hx of recurrent UTI. Mild increase in frequency but hasn't felt like she has a UTI.  HPI  Past Medical History:  Diagnosis Date   Anemia    on Iron supplements   Asthma 1999   as a child; no current problems   Chronic kidney disease    as a child   Constipation    Gall stones    Gallstones 10/23/2016   10/2016: Seen by [facility], pt states, and plans for PP follow up. Advised her on low fat diet   Seizures (HCC)    seizure disorder as a child, treated with medication; none since   UTI (urinary tract infection)    Past Surgical History:  Procedure Laterality Date   BACK SURGERY     DILATION AND EVACUATION N/A 03/15/2023   Procedure: DILATATION AND EVACUATION;  Surgeon: Myna Hidalgo, DO;  [facility]: [facility];  Service: Gynecology;  Laterality: N/A;   IRRIGATION AND DEBRIDEMENT ABSCESS Left 06/11/2017   Procedure: IRRIGATION, DRAINAGE AND DEBRIDEMENT LEFT FLANK NECROTIZING SOFT TISSUE INFECTION;  Surgeon: Avel Peace, MD;  Location: [facility];  Service: General;  Laterality: Left;   OPERATIVE ULTRASOUND N/A 03/15/2023   Procedure: OPERATIVE ULTRASOUND;  Surgeon: Myna Hidalgo, DO;  Location: [facility];  Service: Gynecology;  Laterality: N/A;   TYMPANOSTOMY TUBE PLACEMENT     WOUND DEBRIDEMENT Left 06/12/2017   Procedure: DRESSING CHANGE AND DEBRIDEMENT OF WOUND;  Surgeon: Avel Peace, MD;  Location: [facility];  Service: General;  Laterality: Left;   Social History   Socioeconomic History   Marital status: Single    Spouse name: Not on file   Number of children: 1   Years of education: 12   Highest education level: High school  graduate  Occupational History   Not on file  Tobacco Use   Smoking status: Never   Smokeless tobacco: Never  Vaping Use   Vaping status: Never Used  Substance and Sexual Activity   Alcohol use: No   Drug use: Not Currently    Types: Marijuana    Comment: last used 05/2018   Sexual activity: Yes    Birth control/protection: None  Other Topics Concern   Not on file  Social History Narrative   Pt received Crib from [employer] on 01/17/2021.   Social Determinants of Health   Financial Resource Strain: Not on file  Food Insecurity: No Food Insecurity (04/02/2023)   Hunger Vital Sign    Worried About Running Out of Food in the Last Year: Never true    Ran Out of Food in the Last Year: Never true  Transportation Needs: No Transportation Needs (04/02/2023)   PRAPARE - Administrator, Civil Service (Medical): No    Lack of Transportation (Non-Medical): No  Physical Activity: Not on file  Stress: Not on file  Social Connections: Not on file  Intimate Partner Violence: Not At Risk (08/18/2018)   Humiliation, Afraid, Rape, and Kick questionnaire    Fear of Current or Ex-Partner: No    Emotionally Abused: No    Physically Abused: No    Sexually Abused: No   No current facility-administered  medications on file prior to encounter.   Current Outpatient Medications on File Prior to Encounter  Medication Sig Dispense Refill   Blood Pressure Monitoring (BLOOD PRESSURE KIT) DEVI 1 Device by Does not apply route as needed. (Patient not taking: Reported on 04/01/2023) 1 each 0   ciprofloxacin (CIPRO) 500 MG tablet Take 1 tablet (500 mg total) by mouth every 12 (twelve) hours. 10 tablet 0   Misc. Devices (GOJJI WEIGHT SCALE) MISC 1 Device by Does not apply route once a week. (Patient not taking: Reported on 03/12/2023) 1 each 0   Multiple Vitamins-Minerals (MULTIVITAL PO) Take by mouth.     [DISCONTINUED] omeprazole (PRILOSEC) 20 MG capsule Take 1 capsule (20 mg  total) by mouth daily. (Patient not taking: No sig reported) 7 capsule 0   Allergies  Allergen Reactions   Vancomycin Other (See Comments)    Nephrotoxicity    ROS:  Pertinent positives/negatives listed above.  I have reviewed patient's Past Medical Hx, Surgical Hx, Family Hx, Social Hx, medications and allergies.   Physical Exam  Patient Vitals for the past 24 hrs:  BP Temp Temp src Pulse Resp SpO2 Height Weight  06/22/23 0841 128/81 98.4 F (36.9 C) Oral 85 16 100 % 5\' 7"  (1.702 m) 90.6 kg   Constitutional: Well-developed, well-nourished female in no acute distress.  Cardiovascular: normal rate Respiratory: normal effort GI: Abd soft, non-tender. Pos BS x 4 MS: Extremities nontender, no edema, normal ROM Neurologic: Alert and oriented x 4.   LAB RESULTS Results for orders placed or performed during the hospital encounter of 06/22/23 (from the past 24 hour(s))  Pregnancy, urine POC     Status: Abnormal   Collection Time: 06/22/23  8:16 AM  Result Value Ref Range   Preg Test, Ur POSITIVE (A) NEGATIVE  Urinalysis, Routine w reflex microscopic -Urine, Clean Catch     Status: Abnormal   Collection Time: 06/22/23  8:58 AM  Result Value Ref Range   Color, Urine YELLOW YELLOW   APPearance HAZY (A) CLEAR   Specific Gravity, Urine 1.028 1.005 - 1.030   pH 5.0 5.0 - 8.0   Glucose, UA NEGATIVE NEGATIVE mg/dL   Hgb urine dipstick NEGATIVE NEGATIVE   Bilirubin Urine NEGATIVE NEGATIVE   Ketones, ur NEGATIVE NEGATIVE mg/dL   Protein, ur NEGATIVE NEGATIVE mg/dL   Nitrite NEGATIVE NEGATIVE   Leukocytes,Ua TRACE (A) NEGATIVE   RBC / HPF 0-5 0 - 5 RBC/hpf   WBC, UA 6-10 0 - 5 WBC/hpf   Bacteria, UA RARE (A) NONE SEEN   Squamous Epithelial / HPF 6-10 0 - 5 /HPF   Mucus PRESENT   Wet prep, genital     Status: None   Collection Time: 06/22/23  8:58 AM   Specimen: Urine, Clean Catch  Result Value Ref Range   Yeast Wet Prep HPF POC NONE SEEN NONE SEEN   Trich, Wet Prep NONE SEEN NONE  SEEN   Clue Cells Wet Prep HPF POC NONE SEEN NONE SEEN   WBC, Wet Prep HPF POC <10 <10   Sperm NONE SEEN   CBC     Status: Abnormal   Collection Time: 06/22/23  9:04 AM  Result Value Ref Range   WBC 6.6 4.0 - 10.5 K/uL   RBC 3.74 (L) 3.87 - 5.11 MIL/uL   Hemoglobin 11.2 (L) 12.0 - 15.0 g/dL   HCT 40.9 (L) 81.1 - 91.4 %   MCV 88.8 80.0 - 100.0 fL   MCH 29.9 26.0 - 34.0  pg   MCHC 33.7 30.0 - 36.0 g/dL   RDW 78.2 95.6 - 21.3 %   Platelets 303 150 - 400 K/uL   nRBC 0.0 0.0 - 0.2 %  Comprehensive metabolic panel     Status: Abnormal   Collection Time: 06/22/23  9:04 AM  Result Value Ref Range   Sodium 132 (L) 135 - 145 mmol/L   Potassium 3.7 3.5 - 5.1 mmol/L   Chloride 104 98 - 111 mmol/L   CO2 21 (L) 22 - 32 mmol/L   Glucose, Bld 88 70 - 99 mg/dL   BUN 10 6 - 20 mg/dL   Creatinine, Ser 0.86 0.44 - 1.00 mg/dL   Calcium 9.0 8.9 - 57.8 mg/dL   Total Protein 7.1 6.5 - 8.1 g/dL   Albumin 3.7 3.5 - 5.0 g/dL   AST 19 15 - 41 U/L   ALT 20 0 - 44 U/L   Alkaline Phosphatase 49 38 - 126 U/L   Total Bilirubin 0.3 <1.2 mg/dL   GFR, Estimated >46 >96 mL/min   Anion gap 7 5 - 15  hCG, quantitative, pregnancy     Status: Abnormal   Collection Time: 06/22/23  9:04 AM  Result Value Ref Range   hCG, Beta Chain, Quant, S 74 (H) <5 mIU/mL  ABO/Rh     Status: None   Collection Time: 06/22/23  9:04 AM  Result Value Ref Range   ABO/RH(D) A POS    No rh immune globuloin      NOT A RH IMMUNE GLOBULIN CANDIDATE, PT RH POSITIVE Performed at [facility], [address] [place], [place] [address]     --/--/A POS (11/10 4132)  IMAGING No results found.  Ultrasound images reviewed with Dr. Alvester Morin. No definitive pregnancy identified. Small amount of fluid in endometrium.  MAU Management/MDM: Orders Placed This Encounter  Procedures   Wet prep, genital   Culture, OB Urine   [place] MFM OB Comp Less 14 Wks   Urinalysis, Routine w reflex microscopic -Urine, Clean Catch   CBC    Comprehensive metabolic panel   hCG, quantitative, pregnancy   Pregnancy, urine POC   ABO/Rh   Discharge patient    Meds ordered this encounter  Medications   cephALEXin (KEFLEX) 500 MG capsule    Sig: Take 1 capsule (500 mg total) by mouth 3 (three) times daily.    Dispense:  21 capsule    Refill:  0    Patient presents with mild abdominal cramping and positive home UPT. Ectopic work-up performed. Ultimately tested positive for UTI. Treat with keflex. Do suspect this is what is causing cramping. Culture sent. However were unable to confirm IUP today, although this is not surprising given HCG 74. Suspect she is very early on in pregnancy. Will treat as pregnancy of unknown location. Will follow-up with [facility] (message sent) for repeat HCG in 48 hours and repeat U/S in 10-14 days. Return precautions for miscarriage, ectopic pregnancy given.  ASSESSMENT 1. Acute cystitis without hematuria   2. Pregnancy of unknown anatomic location   3. [redacted] weeks gestation of pregnancy     PLAN Discharge home with strict return precautions. Allergies as of 06/22/2023       Reactions   Vancomycin Other (See Comments)   Nephrotoxicity        Medication List     STOP taking these medications    Blood Pressure Kit Devi   ciprofloxacin 500 MG tablet Commonly known as: CIPRO  Gojji Weight Scale Misc       TAKE these medications    cephALEXin 500 MG capsule Commonly known as: KEFLEX Take 1 capsule (500 mg total) by mouth 3 (three) times daily.   MULTIVITAL PO Take by mouth.         Wylene Simmer, MD OB Fellow 06/22/2023  11:47 AM

## 2023-06-23 ENCOUNTER — Other Ambulatory Visit: Payer: Self-pay

## 2023-06-23 DIAGNOSIS — O3680X0 Pregnancy with inconclusive fetal viability, not applicable or unspecified: Secondary | ICD-10-CM

## 2023-06-23 LAB — GC/CHLAMYDIA PROBE AMP (~~LOC~~) NOT AT ARMC
Chlamydia: NEGATIVE
Comment: NEGATIVE
Comment: NORMAL
Neisseria Gonorrhea: NEGATIVE

## 2023-06-24 LAB — CULTURE, OB URINE: Culture: 30000 — AB

## 2023-06-25 ENCOUNTER — Ambulatory Visit: Payer: Medicaid Other

## 2023-06-25 ENCOUNTER — Other Ambulatory Visit: Payer: Self-pay

## 2023-06-25 VITALS — BP 118/82 | HR 88 | Ht 67.0 in | Wt 200.6 lb

## 2023-06-25 DIAGNOSIS — Z3A01 Less than 8 weeks gestation of pregnancy: Secondary | ICD-10-CM

## 2023-06-25 DIAGNOSIS — O3680X0 Pregnancy with inconclusive fetal viability, not applicable or unspecified: Secondary | ICD-10-CM

## 2023-06-25 LAB — BETA HCG QUANT (REF LAB): hCG Quant: 426 m[IU]/mL

## 2023-06-25 NOTE — Progress Notes (Cosign Needed Addendum)
Pt here today for STAT Beta from MAU follow up on 06/22/2023. Pt states did have cramping at that time of MAU visit, but also to find had UTI. Was given Rx Keflex and is still taking today. Pt has [place] for dating/viability on 07/03/2023.  Pt denies any vaginal bleeding, pain or cramps. Pt advised after results come back from Beta, will be contacted via phone with plan of care per Rutherford Limerick, CNM.Pt verbalized understanding. Advised patient with Ectopic precautions.   Judeth Cornfield, RNC

## 2023-06-25 NOTE — Progress Notes (Signed)
Talked with Dr Vergie Living about results. Advised normal rise in hcg levels. Keep [place] follow up on 07/03/23 and no further beta needed at this time.   Call placed to pt. Pt given results and follow up care per Dr Vergie Living. Pt verbalized understanding.  Judeth Cornfield, RNC

## 2023-06-26 ENCOUNTER — Encounter: Payer: Self-pay | Admitting: Certified Nurse Midwife

## 2023-07-03 ENCOUNTER — Encounter: Payer: Self-pay | Admitting: Certified Nurse Midwife

## 2023-07-03 ENCOUNTER — Ambulatory Visit: Payer: Medicaid Other

## 2023-07-03 ENCOUNTER — Other Ambulatory Visit: Payer: Self-pay

## 2023-07-03 DIAGNOSIS — O3680X0 Pregnancy with inconclusive fetal viability, not applicable or unspecified: Secondary | ICD-10-CM

## 2023-07-03 DIAGNOSIS — Z3A01 Less than 8 weeks gestation of pregnancy: Secondary | ICD-10-CM

## 2023-07-04 ENCOUNTER — Encounter: Payer: Self-pay | Admitting: Certified Nurse Midwife

## 2023-07-04 DIAGNOSIS — O3680X0 Pregnancy with inconclusive fetal viability, not applicable or unspecified: Secondary | ICD-10-CM

## 2023-07-12 ENCOUNTER — Telehealth: Payer: Medicaid Other | Admitting: Physician Assistant

## 2023-07-12 DIAGNOSIS — Z3A01 Less than 8 weeks gestation of pregnancy: Secondary | ICD-10-CM | POA: Diagnosis not present

## 2023-07-12 DIAGNOSIS — O219 Vomiting of pregnancy, unspecified: Secondary | ICD-10-CM | POA: Diagnosis not present

## 2023-07-12 NOTE — Progress Notes (Signed)
Virtual Visit Consent   Frances [name], you are scheduled for a virtual visit with a [facility] provider today. Just as with appointments in the office, your consent must be obtained to participate. Your consent will be active for this visit and any virtual visit you may have with one of our providers in the next 365 days. If you have a MyChart account, a copy of this consent can be sent to you electronically.  As this is a virtual visit, video technology does not allow for your provider to perform a traditional examination. This may limit your provider's ability to fully assess your condition. If your provider identifies any concerns that need to be evaluated in person or the need to arrange testing (such as labs, EKG, etc.), we will make arrangements to do so. Although advances in technology are sophisticated, we cannot ensure that it will always work on either your end or our end. If the connection with a video visit is poor, the visit may have to be switched to a telephone visit. With either a video or telephone visit, we are not always able to ensure that we have a secure connection.  By engaging in this virtual visit, you consent to the provision of healthcare and authorize for your insurance to be billed (if applicable) for the services provided during this visit. Depending on your insurance coverage, you may receive a charge related to this service.  I need to obtain your verbal consent now. Are you willing to proceed with your visit today? Frances [name] has provided verbal consent on 07/12/2023 for a virtual visit (video or telephone). Tylene Fantasia Ward, PA-C  Date: 07/12/2023 5:11 PM  Virtual Visit via Video Note   I, Tylene Fantasia Ward, connected with  Frances [name]  ([id number], Apr 28, 1996) on 07/12/23 at  5:00 PM EST by a video-enabled telemedicine application and verified that I am speaking with the correct person using two identifiers.  Location: Patient: Virtual Visit Location  Patient: Home Provider: Virtual Visit Location Provider: Home Office   I discussed the limitations of evaluation and management by telemedicine and the availability of in person appointments. The patient expressed understanding and agreed to proceed.    History of Present Illness: Frances [name] is a 27 y.o. who identifies as a female who was assigned female at birth, and is being seen today for nausea during pregnancy.  She is estimated [redacted] weeks pregnant at this time.  Has her first appointment with OBGYN on Monday. Reports nausea worse over the last few days.  Denies fever, chills, diarrhea, abdominal pain.   HPI: HPI  Problems:  Patient Active Problem List   Diagnosis Date Noted   Missed abortion 03/15/2023   Nonintractable headache 11/20/2022   History of asthma 08/18/2018   History of seizure disorder 08/18/2018   History of kidney disease as a child 08/18/2018   History of recurrent UTIs 09/16/2016    Allergies:  Allergies  Allergen Reactions   Vancomycin Other (See Comments)    Nephrotoxicity   Medications:  Current Outpatient Medications:    cephALEXin (KEFLEX) 500 MG capsule, Take 1 capsule (500 mg total) by mouth 3 (three) times daily., Disp: 21 capsule, Rfl: 0   Multiple Vitamins-Minerals (MULTIVITAL PO), Take by mouth., Disp: , Rfl:   Observations/Objective: Patient is well-developed, well-nourished in no acute distress.  Resting comfortably at home.  Head is normocephalic, atraumatic.  No labored breathing.  Speech is clear and coherent with logical content.  Patient is alert and oriented  at baseline.    Assessment and Plan: 1. Nausea and vomiting during pregnancy  Supportive care discussed. Follow up with OBGYN on Monday.   Follow Up Instructions: I discussed the assessment and treatment plan with the patient. The patient was provided an opportunity to ask questions and all were answered. The patient agreed with the plan and demonstrated an understanding of  the instructions.  A copy of instructions were sent to the patient via MyChart unless otherwise noted below.     The patient was advised to call back or seek an in-person evaluation if the symptoms worsen or if the condition fails to improve as anticipated.    Tylene Fantasia Ward, PA-C

## 2023-07-12 NOTE — Patient Instructions (Addendum)
  [name], thank you for joining Tylene Fantasia Ward, PA-C for today's virtual visit.  While this provider is not your primary care provider (PCP), if your PCP is located in our provider database this encounter information will be shared with them immediately following your visit.   A [facility] MyChart account gives you access to today's visit and all your visits, tests, and labs performed at [facility] " click here if you don't have a [facility] MyChart account or go to [email or web address]  Consent: (Patient) Frances [name] provided verbal consent for this virtual visit at the beginning of the encounter.  Current Medications:  Current Outpatient Medications:    cephALEXin (KEFLEX) 500 MG capsule, Take 1 capsule (500 mg total) by mouth 3 (three) times daily., Disp: 21 capsule, Rfl: 0   Multiple Vitamins-Minerals (MULTIVITAL PO), Take by mouth., Disp: , Rfl:    Medications ordered in this encounter:  No orders of the defined types were placed in this encounter.    *If you need refills on other medications prior to your next appointment, please contact your pharmacy*  Follow-Up: Call back or seek an in-person evaluation if the symptoms worsen or if the condition fails to improve as anticipated.  [facility] [phone]  Other Instructions  Recommend eating small frequent meals, stay hydrated.  Can try ginger chews, lollipops, or tea. Can take Vitamin B6 62m and Unisom 10mg .    If you have been instructed to have an in-person evaluation today at a local Urgent Care facility, please use the link below. It will take you to a list of all of our available [facility], including address, phone number and hours of operation. Please do not delay care.  [facility]  If you or a family member do not have a primary care provider, use the link below to schedule a visit and establish care. When you choose a [facility] primary care  physician or advanced practice provider, you gain a long-term partner in health. Find a Primary Care Provider  Learn more about [facility]'s in-office and virtual care options: [facility] - Get Care Now

## 2023-07-14 ENCOUNTER — Ambulatory Visit (HOSPITAL_BASED_OUTPATIENT_CLINIC_OR_DEPARTMENT_OTHER)
Admission: RE | Admit: 2023-07-14 | Discharge: 2023-07-14 | Disposition: A | Discharge status: Home / Self Care | Payer: Medicaid Other | Source: Ambulatory Visit | Attending: Obstetrics and Gynecology | Admitting: Obstetrics and Gynecology

## 2023-07-14 DIAGNOSIS — O208 Other hemorrhage in early pregnancy: Secondary | ICD-10-CM | POA: Diagnosis not present

## 2023-07-14 DIAGNOSIS — O3680X0 Pregnancy with inconclusive fetal viability, not applicable or unspecified: Secondary | ICD-10-CM | POA: Diagnosis present

## 2023-07-14 DIAGNOSIS — Z3687 Encounter for antenatal screening for uncertain dates: Secondary | ICD-10-CM | POA: Diagnosis not present

## 2023-07-14 DIAGNOSIS — Z3682 Encounter for antenatal screening for nuchal translucency: Secondary | ICD-10-CM | POA: Diagnosis not present

## 2023-07-14 DIAGNOSIS — O3481 Maternal care for other abnormalities of pelvic organs, first trimester: Secondary | ICD-10-CM | POA: Diagnosis not present

## 2023-07-16 ENCOUNTER — Telehealth: Payer: Self-pay | Admitting: Lactation Services

## 2023-07-16 NOTE — Telephone Encounter (Signed)
-----   Message from [facility] sent at 07/16/2023  8:17 AM EST ----- Please inform patient of viable pregnancy with [facility] 02/28/24 consistent with LMP. There is a subchorionic hemorrhage which may cause her to have some vaginal bleeding. This will be followed up on at her next ultrasound in 10 weeks

## 2023-07-16 NOTE — Telephone Encounter (Signed)
Called and spoke with patient. Gave results of [place] and can start Prenatal care.   She is having nausea and jittery. She is eating ok. She is drinking gingerale and that is working for now. Reviewed eating small frequent meals.   She is taking PNV.   [facility] Message to front office to schedule New OB intake and New OB appointment.   Patient with no other questions or concerns at this time.

## 2023-08-07 ENCOUNTER — Telehealth: Payer: Medicaid Other

## 2023-08-07 DIAGNOSIS — O0991 Supervision of high risk pregnancy, unspecified, first trimester: Secondary | ICD-10-CM

## 2023-08-07 DIAGNOSIS — O0990 Supervision of high risk pregnancy, unspecified, unspecified trimester: Secondary | ICD-10-CM | POA: Insufficient documentation

## 2023-08-07 DIAGNOSIS — Z3490 Encounter for supervision of normal pregnancy, unspecified, unspecified trimester: Secondary | ICD-10-CM | POA: Insufficient documentation

## 2023-08-07 DIAGNOSIS — Z3A10 10 weeks gestation of pregnancy: Secondary | ICD-10-CM | POA: Diagnosis not present

## 2023-08-07 MED ORDER — BLOOD PRESSURE KIT DEVI: 1.0000 | 0 refills | Status: DC | PRN

## 2023-08-07 NOTE — Patient Instructions (Signed)

## 2023-08-07 NOTE — Progress Notes (Signed)
New OB Intake  I connected with Frances [name]  on 08/07/23 at  2:15 PM EST by MyChart Video Visit and verified that I am speaking with the correct person using two identifiers. Nurse is located at [facility] and pt is located at home.  I discussed the limitations, risks, security and privacy concerns of performing an evaluation and management service by telephone and the availability of in person appointments. I also discussed with the patient that there may be a patient responsible charge related to this service. The patient expressed understanding and agreed to proceed.  I explained I am completing New OB Intake today. We discussed Frances of 02/28/2024, by Last Menstrual Period. Pt is [id number]. I reviewed her allergies, medications and Medical/Surgical/OB history.    There are no active problems to display for this patient.   Concerns addressed today  Delivery Plans Plans to deliver at [facility]. Discussed the nature of our practice with multiple providers including residents and students. Due to the size of the practice, the delivering provider may not be the same as those providing prenatal care.   Patient is not interested in water birth. Offered upcoming OB visit with CNM to discuss further.  MyChart/Babyscripts MyChart access verified. I explained pt will have some visits in office and some virtually. Babyscripts instructions given and order placed. Patient verifies receipt of registration text/e-mail. Account successfully created and app downloaded. If patient is a candidate for Optimized scheduling, add to sticky note.   Blood Pressure Cuff/Weight Scale Blood pressure cuff ordered for patient to pick-up from [employer]. Explained after first prenatal appt pt will check weekly and document in Babyscripts.  Anatomy US Explained first scheduled [place] will be around 19 weeks. Anatomy US scheduled for 10/06/2023 at 7:15am.  Is patient a CenteringPregnancy candidate?  Declined Declined due  to Declined to say   Is patient a Mom+Baby Combined Care candidate?  Not a candidate   If accepted, confirm patient does not intend to move from the area for at least 12 months, then notify Mom+Baby staff  Interested in Frances [name]? If yes, send referral and doula dot phrase.   Is patient a candidate for Babyscripts Optimization? OFFER BABYSCRIPTS    First visit review I reviewed new OB appt with patient. Explained pt will be seen by Frances [name], CNM at first visit. Discussed Frances [name] genetic screening with patient. Panorama and Horizon.. Routine prenatal labs is needed at new ob visit.  Last Pap Diagnosis  Date Value Ref Range Status  10/27/2019   Final   - Negative for intraepithelial lesion or malignancy (NILM)    B'Aisha T Darcia Lampi, CMA 08/07/2023  2:15 PM

## 2023-08-11 ENCOUNTER — Other Ambulatory Visit: Payer: Self-pay

## 2023-08-11 ENCOUNTER — Inpatient Hospital Stay (HOSPITAL_COMMUNITY)
Admission: AD | Admit: 2023-08-11 | Discharge: 2023-08-11 | Disposition: A | Discharge status: Home / Self Care | Payer: Medicaid Other | Attending: Obstetrics and Gynecology | Admitting: Obstetrics and Gynecology

## 2023-08-11 DIAGNOSIS — M545 Low back pain, unspecified: Secondary | ICD-10-CM | POA: Diagnosis present

## 2023-08-11 DIAGNOSIS — O26891 Other specified pregnancy related conditions, first trimester: Secondary | ICD-10-CM

## 2023-08-11 DIAGNOSIS — R102 Pelvic and perineal pain: Secondary | ICD-10-CM

## 2023-08-11 DIAGNOSIS — M549 Dorsalgia, unspecified: Secondary | ICD-10-CM | POA: Diagnosis not present

## 2023-08-11 DIAGNOSIS — O99891 Other specified diseases and conditions complicating pregnancy: Secondary | ICD-10-CM | POA: Diagnosis not present

## 2023-08-11 DIAGNOSIS — Z3A11 11 weeks gestation of pregnancy: Secondary | ICD-10-CM | POA: Diagnosis not present

## 2023-08-11 DIAGNOSIS — O0990 Supervision of high risk pregnancy, unspecified, unspecified trimester: Secondary | ICD-10-CM

## 2023-08-11 LAB — URINALYSIS, ROUTINE W REFLEX MICROSCOPIC
Bacteria, UA: NONE SEEN
Bilirubin Urine: NEGATIVE
Glucose, UA: NEGATIVE mg/dL
Hgb urine dipstick: NEGATIVE
Ketones, ur: NEGATIVE mg/dL
Nitrite: NEGATIVE
Protein, ur: NEGATIVE mg/dL
Specific Gravity, Urine: 1.01 (ref 1.005–1.030)
pH: 6 (ref 5.0–8.0)

## 2023-08-11 NOTE — MAU Note (Signed)
Frances [name] is a 27 y.o. at [redacted]w[redacted]d here in MAU reporting: she's having abdominal cramping and lower back pain that began today.  Reports no longer cramping, but was in entire abdomen, back pain remains.  Denies VB.  LMP: NA Onset of complaint: today Pain score: 5 Vitals:   08/11/23 1840  BP: 128/82  Pulse: 95  Resp: 18  Temp: 98.6 F (37 C)  SpO2: 100%     WNU:[id number], unable to hear Lab orders placed from triage: UA

## 2023-08-13 NOTE — Progress Notes (Signed)
 History:   Frances [name] is a 28 y.o. [phone] at [redacted]w[redacted]d by LMP, early ultrasound being seen today for her first obstetrical visit.  Her obstetrical history is significant for  growth restriction, precipitous delivery and one miscarriage at 8wks . Patient does not intend to breast feed. Pregnancy history fully reviewed.  Patient reports  mild nausea managed by eating frequently (makes sure to eat protein with each meal and fruits/vegetables) and ginger ale. Otherwise doing well, has only had back pain and cramping, no bleeding since subchorionic hematoma noted on ultrasound .   HISTORY: OB History  Gravida Para Term Preterm AB Living  5 3 3  0 1 3  SAB IAB Ectopic Multiple Live Births  1 0 0 0 3    # Outcome Date GA Lbr Len/2nd Weight Sex Type Anes PTL Lv  5 Current           4 SAB 03/15/23 [redacted]w[redacted]d   U SAB     3 Term 02/23/21 [redacted]w[redacted]d 02:30 / 00:08 5 lb 15.1 oz (2.696 kg) M Vag-Spont EPI  LIV     Birth Comments: IOL growth restrictions     Name: Cirillo,BOY Aviyanna     Apgar1: 9  Apgar5: 9  2 Term 02/22/19 [redacted]w[redacted]d  6 lb 6.8 oz (2.914 kg) F Vag-Spont None  LIV     Birth Comments: wnl, precipitous delivery- delivery at home     Name: MATTISON, GOLAY  1 Term 02/28/17 [redacted]w[redacted]d 18:20 / 00:27 8 lb 1.8 oz (3.68 kg) F Vag-Spont EPI  LIV     Birth Comments: WNL     Name: TELETHA, PETREA     Apgar1: 9  Apgar5: 9    Last pap smear was done 10/27/19 and was normal  Past Medical History:  Diagnosis Date   Anemia    on Iron  supplements   Asthma 1999   as a child; no current problems   Chronic kidney disease    as a child   Constipation    Gall stones    Gallstones 10/23/2016   10/2016: Seen by [facility], pt states, and plans for PP follow up. Advised her on low fat diet   History of asthma 08/18/2018   As a child, no history of hospitalization/intubation, not on meds     History of kidney disease as a child 08/18/2018   Nml Creatinine     History of recurrent UTIs 09/16/2016   E.coli in  urine at NOB; RX for amox sent to pharmacy and patient notified via MyChart. Needs TOC 4 weeks after completing treatment.      History of seizure disorder 08/18/2018   As a child, no seizures since childhood     Missed abortion 03/15/2023   Seizures (HCC)    seizure disorder as a child, treated with medication; none since   UTI (urinary tract infection)    Past Surgical History:  Procedure Laterality Date   BACK SURGERY     DILATION AND EVACUATION N/A 03/15/2023   Procedure: DILATATION AND EVACUATION;  Surgeon: Ozan, Jennifer, DO;  Location: [facility] OR;  Service: Gynecology;  Laterality: N/A;   IRRIGATION AND DEBRIDEMENT ABSCESS Left 06/11/2017   Procedure: IRRIGATION, DRAINAGE AND DEBRIDEMENT LEFT FLANK NECROTIZING SOFT TISSUE INFECTION;  Surgeon: Lily Boas, MD;  Location: [facility] ORS;  Service: General;  Laterality: Left;   OPERATIVE ULTRASOUND N/A 03/15/2023   Procedure: OPERATIVE ULTRASOUND;  Surgeon: Marilynn Nest, DO;  Location: [facility] OR;  Service: Gynecology;  Laterality: N/A;   TYMPANOSTOMY  TUBE PLACEMENT     WOUND DEBRIDEMENT Left 06/12/2017   Procedure: DRESSING CHANGE AND DEBRIDEMENT OF WOUND;  Surgeon: Lily Boas, MD;  Location: [facility] ORS;  Service: General;  Laterality: Left;   Family History  Problem Relation Age of Onset   Heart disease Mother        ? arrythmia   Asthma Mother    Hypertension Mother    Intellectual disability Father    Asthma Sister    Birth defects Sister        Down's syndrome   Social History   Tobacco Use   Smoking status: Never   Smokeless tobacco: Never  Vaping Use   Vaping status: Never Used  Substance Use Topics   Alcohol use: No   Drug use: Not Currently    Types: Marijuana    Comment: last used 05/2018   Allergies  Allergen Reactions   Vancomycin  Other (See Comments)    Nephrotoxicity   Current Outpatient Medications on File Prior to Visit  Medication Sig Dispense Refill   Blood Pressure Monitoring (BLOOD PRESSURE KIT) DEVI 1  each by Does not apply route as needed. 1 each 0   Multiple Vitamins-Minerals (MULTIVITAL PO) Take by mouth.     [DISCONTINUED] omeprazole  (PRILOSEC) 20 MG capsule Take 1 capsule (20 mg total) by mouth daily. (Patient not taking: No sig reported) 7 capsule 0   No current facility-administered medications on file prior to visit.    Review of Systems Pertinent items noted in HPI and remainder of comprehensive ROS otherwise negative. Physical Exam:   Vitals:   08/14/23 0907  BP: 128/78  Pulse: (!) 101  Weight: 208 lb 1.6 oz (94.4 kg)   Fetal Heart Rate (bpm): 155  Constitutional: Well-developed, well-nourished pregnant female in no acute distress.  HEENT: PERRLA Skin: normal color and turgor, no rash Cardiovascular: normal rate & rhythm, warm and well perfused Respiratory: normal effort, no problems with respiration noted GI: Abd soft, non-distended MS: Extremities nontender, no edema, normal ROM Neurologic: Alert and oriented x 4.  GU: no CVA tenderness Pelvic: NEFG, physiologic discharge, no blood, cervix clean. Pap/swabs  collected   Assessment:   Pregnancy: [id number] Patient Active Problem List   Diagnosis Date Noted   Subchorionic hematoma 08/14/2023   Supervision of high-risk pregnancy 08/07/2023   Plan:  1. Supervision of high risk pregnancy in first trimester (Primary) - Doing well, only mild nausea - CBC/D/Plt+RPR+Rh+ABO+RubIgG... - HgB A1c - Culture, OB Urine - Cytology - PAP( Bee) - PANORAMA PRENATAL TEST  2. [redacted] weeks gestation of pregnancy - Routine OB care   3. Subchorionic hematoma in first trimester, single or unspecified fetus - Stable, no bleeding since MAU visit  4. Initial obstetric visit in first trimester - Initial labs drawn. - Continue prenatal vitamins. - Problem list reviewed and updated. - Genetic Screening discussed, First trimester screen, Quad screen, and NIPS: ordered. - Ultrasound discussed; fetal anatomic survey: ordered. -  Anticipatory guidance about prenatal visits given including labs, ultrasounds, and testing. - Discussed usage of Babyscripts and virtual visits as additional source of managing and completing prenatal visits in midst of coronavirus and pandemic.   - Encouraged to complete MyChart Registration for her ability to review results, send requests, and have questions addressed.  - The nature of [facility] Healthcare/Faculty Practice with multiple MDs and Advanced Practice Providers was explained to patient; also emphasized that residents, students are part of our team. Desires midwifery care. - Routine obstetric  precautions reviewed. Encouraged to seek out care at office or emergency room [facility] MAU preferred) for urgent and/or emergent concerns.  Return in about 4 weeks (around 09/11/2023) for LOB, IN-PERSON.    Future Appointments  Date Time Provider Department Center  09/10/2023 10:15 AM Vannie Cornell JONELLE EDDY [facility]  10/08/2023  8:15 AM [facility] NURSE [facility]  10/08/2023  8:30 AM [facility] US1 [facility]  10/08/2023  9:35 AM Vannie Cornell JONELLE, CNM [facility]   Cornell Vannie, MSN, CNM, IBCLC Certified Nurse Midwife, [facility]

## 2023-08-13 NOTE — L&D Delivery Note (Signed)
 OB/GYN Faculty Practice Delivery Note  Frances [name] is a 29 y.o. [id number] s/p NSVD at [redacted]w[redacted]d. She was admitted for SROM/SOL.   ROM: 7h 54m with clear fluid GBS Status: Negative/-- (06/25 1703) Maximum Maternal Temperature: Temp (24hrs), Avg:98.6 F (37 C), Min:98.3 F (36.8 C), Max:98.9 F (37.2 C)   Labor Progress: Initial SVE: 5/70. No augmentation required. She then progressed to complete.   Delivery Date/Time: 02/13/24 0443 Delivery: Called to room as patient was complete. Head delivered OA to ROA after one push. nuchal x1 present which was delivered through. Shoulder and body delivered in usual fashion. Infant with spontaneous cry, placed on mother's abdomen, dried and stimulated. Cord clamped x 2 after +1-minute delay, and cut by FOB. Cord gases not obtained. Cord blood drawn. Placenta delivered spontaneously with gentle cord traction. Fundus firm with massage and Pitocin . Labia, perineum, and vagina inspected with abrasions. Mom and baby to postpartum. Baby Weight: pending  Placenta: 3 vessel, intact. Sent to L&D Complications: None Lacerations: None EBL: 200 mL Anesthesia: epidural  Infant: baby girl Ka'Zyrah APGAR (1 MIN): 7  APGAR (5 MINS): 9  APGAR (10 MINS):    Almarie CHRISTELLA Moats, MD [facility] Family Medicine Fellow, [facility] for [facility], [facility] 02/13/2024, 5:27 AM

## 2023-08-14 ENCOUNTER — Other Ambulatory Visit: Payer: Self-pay

## 2023-08-14 ENCOUNTER — Other Ambulatory Visit (HOSPITAL_COMMUNITY)
Admission: RE | Admit: 2023-08-14 | Discharge: 2023-08-14 | Disposition: A | Discharge status: Home / Self Care | Payer: Medicaid Other | Source: Ambulatory Visit | Attending: Certified Nurse Midwife | Admitting: Certified Nurse Midwife

## 2023-08-14 ENCOUNTER — Ambulatory Visit (INDEPENDENT_AMBULATORY_CARE_PROVIDER_SITE_OTHER): Payer: Medicaid Other | Admitting: Certified Nurse Midwife

## 2023-08-14 VITALS — BP 128/78 | HR 101 | Wt 208.1 lb

## 2023-08-14 DIAGNOSIS — O468X1 Other antepartum hemorrhage, first trimester: Secondary | ICD-10-CM

## 2023-08-14 DIAGNOSIS — O418X90 Other specified disorders of amniotic fluid and membranes, unspecified trimester, not applicable or unspecified: Secondary | ICD-10-CM | POA: Insufficient documentation

## 2023-08-14 DIAGNOSIS — O418X10 Other specified disorders of amniotic fluid and membranes, first trimester, not applicable or unspecified: Secondary | ICD-10-CM | POA: Diagnosis not present

## 2023-08-14 DIAGNOSIS — Z3A11 11 weeks gestation of pregnancy: Secondary | ICD-10-CM

## 2023-08-14 DIAGNOSIS — Z3491 Encounter for supervision of normal pregnancy, unspecified, first trimester: Secondary | ICD-10-CM

## 2023-08-14 DIAGNOSIS — O0991 Supervision of high risk pregnancy, unspecified, first trimester: Secondary | ICD-10-CM | POA: Insufficient documentation

## 2023-08-14 DIAGNOSIS — O418X20 Other specified disorders of amniotic fluid and membranes, second trimester, not applicable or unspecified: Secondary | ICD-10-CM | POA: Insufficient documentation

## 2023-08-14 DIAGNOSIS — Z1332 Encounter for screening for maternal depression: Secondary | ICD-10-CM

## 2023-08-14 HISTORY — DX: Other antepartum hemorrhage, unspecified trimester: O41.8X90

## 2023-08-14 NOTE — Patient Instructions (Signed)
 [facility] [address], [place], [place] [address] [phone] Hours: Sunday Closed Monday 9AM-6PM Tuesday 9AM-6PM Wednesday 9AM-6PM Thursday 9AM-6PM Friday           9AM-6PM Saturday         10 AM-1PM

## 2023-08-15 LAB — CBC/D/PLT+RPR+RH+ABO+RUBIGG...
Antibody Screen: NEGATIVE
Basophils Absolute: 0 10*3/uL (ref 0.0–0.2)
Basos: 0 %
EOS (ABSOLUTE): 0.1 10*3/uL (ref 0.0–0.4)
Eos: 2 %
HCV Ab: NONREACTIVE
HIV Screen 4th Generation wRfx: NONREACTIVE
Hematocrit: 33.4 % — ABNORMAL LOW (ref 34.0–46.6)
Hemoglobin: 11.4 g/dL (ref 11.1–15.9)
Hepatitis B Surface Ag: NEGATIVE
Immature Grans (Abs): 0 10*3/uL (ref 0.0–0.1)
Immature Granulocytes: 0 %
Lymphocytes Absolute: 1.3 10*3/uL (ref 0.7–3.1)
Lymphs: 21 %
MCH: 30.8 pg (ref 26.6–33.0)
MCHC: 34.1 g/dL (ref 31.5–35.7)
MCV: 90 fL (ref 79–97)
Monocytes Absolute: 0.3 10*3/uL (ref 0.1–0.9)
Monocytes: 6 %
Neutrophils Absolute: 4.2 10*3/uL (ref 1.4–7.0)
Neutrophils: 71 %
Platelets: 281 10*3/uL (ref 150–450)
RBC: 3.7 x10E6/uL — ABNORMAL LOW (ref 3.77–5.28)
RDW: 13.1 % (ref 11.7–15.4)
RPR Ser Ql: NONREACTIVE
Rh Factor: POSITIVE
Rubella Antibodies, IGG: 1.86 {index} (ref >0.99)
WBC: 6 10*3/uL (ref 3.4–10.8)

## 2023-08-15 LAB — HEMOGLOBIN A1C
Est. average glucose Bld gHb Est-mCnc: 97 mg/dL
Hgb A1c MFr Bld: 5 % (ref 4.8–5.6)

## 2023-08-15 LAB — HCV INTERPRETATION

## 2023-08-16 LAB — URINE CULTURE, OB REFLEX

## 2023-08-16 LAB — CULTURE, OB URINE

## 2023-08-19 ENCOUNTER — Encounter: Payer: Self-pay | Admitting: *Deleted

## 2023-08-19 LAB — PANORAMA PRENATAL TEST FULL PANEL:PANORAMA TEST PLUS 5 ADDITIONAL MICRODELETIONS: FETAL FRACTION: 8.9

## 2023-08-19 LAB — CYTOLOGY - PAP
Chlamydia: NEGATIVE
Comment: NEGATIVE
Comment: NORMAL
Neisseria Gonorrhea: NEGATIVE

## 2023-08-20 ENCOUNTER — Encounter: Payer: Self-pay | Admitting: Certified Nurse Midwife

## 2023-08-20 DIAGNOSIS — R87612 Low grade squamous intraepithelial lesion on cytologic smear of cervix (LGSIL): Secondary | ICD-10-CM | POA: Insufficient documentation

## 2023-08-28 ENCOUNTER — Encounter: Payer: Self-pay | Admitting: Certified Nurse Midwife

## 2023-09-02 ENCOUNTER — Encounter: Payer: Self-pay | Admitting: Family Medicine

## 2023-09-02 ENCOUNTER — Ambulatory Visit: Payer: Medicaid Other | Attending: Cardiology

## 2023-09-02 ENCOUNTER — Other Ambulatory Visit: Payer: Self-pay

## 2023-09-02 ENCOUNTER — Inpatient Hospital Stay (HOSPITAL_COMMUNITY)
Admission: AD | Admit: 2023-09-02 | Discharge: 2023-09-02 | Disposition: A | Discharge status: Home / Self Care | Payer: Medicaid Other | Attending: Obstetrics and Gynecology | Admitting: Obstetrics and Gynecology

## 2023-09-02 ENCOUNTER — Encounter (HOSPITAL_COMMUNITY): Payer: Self-pay | Admitting: Obstetrics and Gynecology

## 2023-09-02 DIAGNOSIS — O26892 Other specified pregnancy related conditions, second trimester: Secondary | ICD-10-CM | POA: Insufficient documentation

## 2023-09-02 DIAGNOSIS — R0789 Other chest pain: Secondary | ICD-10-CM | POA: Diagnosis not present

## 2023-09-02 DIAGNOSIS — O208 Other hemorrhage in early pregnancy: Secondary | ICD-10-CM | POA: Diagnosis not present

## 2023-09-02 DIAGNOSIS — Z3A14 14 weeks gestation of pregnancy: Secondary | ICD-10-CM | POA: Insufficient documentation

## 2023-09-02 DIAGNOSIS — R0602 Shortness of breath: Secondary | ICD-10-CM | POA: Diagnosis not present

## 2023-09-02 DIAGNOSIS — R002 Palpitations: Secondary | ICD-10-CM | POA: Diagnosis not present

## 2023-09-02 DIAGNOSIS — O418X10 Other specified disorders of amniotic fluid and membranes, first trimester, not applicable or unspecified: Secondary | ICD-10-CM

## 2023-09-02 DIAGNOSIS — I471 Supraventricular tachycardia, unspecified: Secondary | ICD-10-CM

## 2023-09-02 DIAGNOSIS — O0992 Supervision of high risk pregnancy, unspecified, second trimester: Secondary | ICD-10-CM | POA: Diagnosis not present

## 2023-09-02 DIAGNOSIS — O0991 Supervision of high risk pregnancy, unspecified, first trimester: Secondary | ICD-10-CM

## 2023-09-02 LAB — URINALYSIS, ROUTINE W REFLEX MICROSCOPIC
Bilirubin Urine: NEGATIVE
Glucose, UA: NEGATIVE mg/dL
Hgb urine dipstick: NEGATIVE
Ketones, ur: NEGATIVE mg/dL
Nitrite: NEGATIVE
Protein, ur: NEGATIVE mg/dL
Specific Gravity, Urine: 1.018 (ref 1.005–1.030)
pH: 6 (ref 5.0–8.0)

## 2023-09-02 LAB — COMPREHENSIVE METABOLIC PANEL
ALT: 19 U/L (ref 0–44)
AST: 20 U/L (ref 15–41)
Albumin: 3.2 g/dL — ABNORMAL LOW (ref 3.5–5.0)
Alkaline Phosphatase: 55 U/L (ref 38–126)
Anion gap: 7 (ref 5–15)
BUN: 8 mg/dL (ref 6–20)
CO2: 23 mmol/L (ref 22–32)
Calcium: 9.3 mg/dL (ref 8.9–10.3)
Chloride: 105 mmol/L (ref 98–111)
Creatinine, Ser: 0.53 mg/dL (ref 0.44–1.00)
GFR, Estimated: >60 mL/min (ref >60)
Glucose, Bld: 95 mg/dL (ref 70–99)
Potassium: 3.3 mmol/L — ABNORMAL LOW (ref 3.5–5.1)
Sodium: 135 mmol/L (ref 135–145)
Total Bilirubin: 0.3 mg/dL (ref 0.0–1.2)
Total Protein: 6.5 g/dL (ref 6.5–8.1)

## 2023-09-02 LAB — BRAIN NATRIURETIC PEPTIDE: B Natriuretic Peptide: 27.7 pg/mL (ref 0.0–100.0)

## 2023-09-02 LAB — MAGNESIUM: Magnesium: 1.9 mg/dL (ref 1.7–2.4)

## 2023-09-02 NOTE — Progress Notes (Signed)
Zio orders per Dr. Mallory Shirk request. Following this message.   Hey Dr. Servando Salina,  I just saw this patient  in MAU-- I think she is having intermittent SVT. 3 episodes of SOB, palpitations in the past 4 days all lasting < 30 minutes. EKG WNL in MAU today. I placed the referral but would like her to get a ziopatch possibly sooner.   Appreciate your help  Karyl Kinnier

## 2023-09-02 NOTE — MAU Provider Note (Signed)
History     CSN: [id number]  Arrival date and time: 09/02/23 1254   Event Date/Time   First Provider Initiated Contact with Patient 09/02/23 1352      Chief Complaint  Patient presents with   Tachycardia   Chest Pain   Frances [name] 27 y.o. [id number] presenting with palpitations and shortness of breath associated with intermittent chest pain.  Patient reports onset of symptoms on Friday she was lying in bed.  She reports that she just noticed her heart was racing and she became short of breath. This episode lasted much less than 30 minutes, she thinks about 10 min.  She reports this happened again on Monday night and this morning. This morning it was associated with midline chest pain that immediately resolved when palpitations resolved.   Denies ever having any symptoms in previous pregnancies.  She denies currently having the symptoms and they resolved before her arrival here to the MAU.  She reports a family history significant for a grandmother with congestive heart failure and a mother whose hear  "skips beats"  Chest Pain  Associated symptoms include palpitations. Pertinent negatives include no abdominal pain, back pain, cough, dizziness, fever, nausea, shortness of breath or vomiting.    OB History     Gravida  5   Para  3   Term  3   Preterm  0   AB  1   Living  3      SAB  1   IAB  0   Ectopic  0   Multiple  0   Live Births  3           Past Medical History:  Diagnosis Date   Anemia    on Iron supplements   Asthma 1999   as a child; no current problems   Chronic kidney disease    as a child   Constipation    Gall stones    Gallstones 10/23/2016   10/2016: Seen by [facility], pt states, and plans for PP follow up. Advised her on low fat diet   History of asthma 08/18/2018   As a child, no history of hospitalization/intubation, not on meds     History of kidney disease as a child 08/18/2018   Nml Creatinine     History of recurrent UTIs 09/16/2016    E.coli in urine at [facility]; RX for amox sent to pharmacy and patient notified via MyChart. Needs TOC 4 weeks after completing treatment.      History of seizure disorder 08/18/2018   As a child, no seizures since childhood     Missed abortion 03/15/2023   Seizures (HCC)    seizure disorder as a child, treated with medication; none since   UTI (urinary tract infection)     Past Surgical History:  Procedure Laterality Date   BACK SURGERY     DILATION AND EVACUATION N/A 03/15/2023   Procedure: DILATATION AND EVACUATION;  Surgeon: Myna Hidalgo, DO;  Location: [facility];  Service: Gynecology;  Laterality: N/A;   IRRIGATION AND DEBRIDEMENT ABSCESS Left 06/11/2017   Procedure: IRRIGATION, DRAINAGE AND DEBRIDEMENT LEFT FLANK NECROTIZING SOFT TISSUE INFECTION;  Surgeon: Avel Peace, MD;  Location: [facility];  Service: General;  Laterality: Left;   OPERATIVE ULTRASOUND N/A 03/15/2023   Procedure: OPERATIVE ULTRASOUND;  Surgeon: Myna Hidalgo, DO;  Location: [facility];  Service: Gynecology;  Laterality: N/A;   TYMPANOSTOMY TUBE PLACEMENT     WOUND DEBRIDEMENT Left 06/12/2017   Procedure: DRESSING CHANGE AND DEBRIDEMENT  OF WOUND;  Surgeon: Avel Peace, MD;  Location: [facility];  Service: General;  Laterality: Left;    Family History  Problem Relation Age of Onset   Heart disease Mother        ? arrythmia   Asthma Mother    Hypertension Mother    Intellectual disability Father    Asthma Sister    Birth defects Sister        Down's syndrome    Social History   Tobacco Use   Smoking status: Never   Smokeless tobacco: Never  Vaping Use   Vaping status: Never Used  Substance Use Topics   Alcohol use: No   Drug use: Not Currently    Types: Marijuana    Comment: last used 05/2018    Allergies:  Allergies  Allergen Reactions   Vancomycin Other (See Comments)    Nephrotoxicity    Medications Prior to Admission  Medication Sig Dispense Refill Last Dose/Taking   Prenatal Vit-Fe Fumarate-FA  (MULTIVITAMIN-PRENATAL) 27-0.8 MG TABS tablet Take 1 tablet by mouth daily at 12 noon.   09/02/2023 Morning   Blood Pressure Monitoring (BLOOD PRESSURE KIT) DEVI 1 each by Does not apply route as needed. 1 each 0    Multiple Vitamins-Minerals (MULTIVITAL PO) Take by mouth.       Review of Systems  Constitutional:  Negative for chills and fever.  HENT:  Negative for congestion and sore throat.   Eyes:  Negative for pain and visual disturbance.  Respiratory:  Negative for cough, chest tightness and shortness of breath.   Cardiovascular:  Positive for chest pain and palpitations.  Gastrointestinal:  Negative for abdominal pain, diarrhea, nausea and vomiting.  Endocrine: Negative for cold intolerance and heat intolerance.  Genitourinary:  Negative for dysuria and flank pain.  Musculoskeletal:  Negative for back pain.  Skin:  Negative for rash.  Allergic/Immunologic: Negative for food allergies.  Neurological:  Negative for dizziness and light-headedness.  Psychiatric/Behavioral:  Negative for agitation.    Physical Exam   Blood pressure 120/68, pulse 97, temperature 98.4 F (36.9 C), resp. rate 18, height 5\' 7"  (1.702 m), weight 95.7 kg, last menstrual period 05/24/2023, SpO2 100%.  Physical Exam Vitals and nursing note reviewed.  Constitutional:      General: She is not in acute distress.    Appearance: She is well-developed.  HENT:     Head: Normocephalic and atraumatic.  Eyes:     General: No scleral icterus.    Conjunctiva/sclera: Conjunctivae normal.  Cardiovascular:     Rate and Rhythm: Normal rate.     Heart sounds: Normal heart sounds.  Pulmonary:     Effort: Pulmonary effort is normal.  Chest:     Chest wall: No tenderness.  Abdominal:     Palpations: Abdomen is soft.     Tenderness: There is no abdominal tenderness. There is no guarding or rebound.  Genitourinary:    Vagina: Normal.  Musculoskeletal:        General: Normal range of motion.     Cervical back:  Normal range of motion and neck supple.  Skin:    General: Skin is warm and dry.     Findings: No rash.  Neurological:     Mental Status: She is alert and oriented to person, place, and time.     MAU Course  Procedures  MDM: moderate  This patient presents to the ED for concern of   Chief Complaint  Patient presents with   Tachycardia  Chest Pain     These complains involves an extensive number of treatment options, and is a complaint that carries with it a high risk of complications and morbidity.  The differential diagnosis for  1.  Palpitations with associated shortness of breath and chest pain INCLUDES most likely diagnosis is SVT.  Less likely ACS as the patient does not have any ongoing chest pain.  Additionally patient does not have any risk factors for early onset ACS.  Unlikely persistent arrhythmia with normal EKG on admission. Anemia is not contributing as patient had a normal HGB at new OB.    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  The pertinent results include:   Results for orders placed or performed during the hospital encounter of 09/02/23 (from the past 24 hours)  Urinalysis, Routine w reflex microscopic -Urine, Clean Catch     Status: Abnormal   Collection Time: 09/02/23  1:30 PM  Result Value Ref Range   Color, Urine YELLOW YELLOW   APPearance HAZY (A) CLEAR   Specific Gravity, Urine 1.018 1.005 - 1.030   pH 6.0 5.0 - 8.0   Glucose, UA NEGATIVE NEGATIVE mg/dL   Hgb urine dipstick NEGATIVE NEGATIVE   Bilirubin Urine NEGATIVE NEGATIVE   Ketones, ur NEGATIVE NEGATIVE mg/dL    Protein, ur NEGATIVE NEGATIVE mg/dL   Nitrite NEGATIVE NEGATIVE   Leukocytes,Ua MODERATE (A) NEGATIVE   RBC / HPF 0-5 0 - 5 RBC/hpf   WBC, UA 0-5 0 - 5 WBC/hpf   Bacteria, UA RARE (A) NONE SEEN   Squamous Epithelial / HPF 6-10 0 - 5 /HPF   Mucus PRESENT     Cardiac Testing/Monitoring:  EKG was ordered today. I personally reviewed EKG which was WNL. Normal sinus, normale PR and QRS complexes. 94BPM.    MAU Course:  After the interventions noted above, I reevaluated the patient and found that they have :improved  Dispostion: discharged   Assessment and Plan   1. Palpitations   2. Supervision of high risk pregnancy in first trimester   3. Subchorionic hematoma in first trimester, single or unspecified fetus   4. Shortness of breath   5. Other chest pain   6. [redacted] weeks gestation of pregnancy    - Mostly likely SVT, referral to [facility] cardiology placed today - Message sent to Dr. Servando Salina to help coordinate getting ziopatch - Reviewed returning to MAU if having frequent episodes - Instructed on valsalva and blowing into a straw to help with sx if not quickly resolving  - Follow up with CNM Jam  Future Appointments  Date Time Provider Department Center  09/10/2023 10:15 AM Osborne Oman [facility]  10/08/2023  8:15 AM [facility] NURSE [facility]  10/08/2023  8:30 AM [facility] US1 [facility]  10/08/2023  9:35 AM Bernerd Limbo, CNM [facility]     Isa Rankin Chais Fehringer 09/02/2023, 1:52 PM

## 2023-09-02 NOTE — MAU Note (Signed)
.  Frances [name] is a 28 y.o. at [redacted]w[redacted]d here in MAU reporting: she feels her heart racing nd having chest pressure and SOB. Has been happening for several weeks. Stated she messaged her OB office they told her to come to MAU.  LMP:  Onset of complaint: last night Pain score: 8 Vitals:   09/02/23 1313  BP: 120/68  Pulse: 97  Resp: 18  Temp: 98.4 F (36.9 C)     FHT: 157  Lab orders placed from triage: u/a. EKG

## 2023-09-02 NOTE — Progress Notes (Unsigned)
Enrolled patient for a 14 day Zio XT  monitor to be mailed to patients home  °

## 2023-09-07 NOTE — Progress Notes (Signed)
   OBSTETRICS PRENATAL VIRTUAL VISIT ENCOUNTER NOTE  Provider location: Center for [facility]   Patient location: Home  I connected with Frances [name] on 09/12/23 at 10:15 AM EST by MyChart Video Encounter and verified that I am speaking with the correct person using two identifiers. I discussed the limitations, risks, security and privacy concerns of performing an evaluation and management service virtually and the availability of in person appointments. I also discussed with the patient that there may be a patient responsible charge related to this service. The patient expressed understanding and agreed to proceed. Subjective:  Frances [name] is a 28 y.o. [phone] at [redacted]w[redacted]d being seen today for ongoing prenatal care.  She is currently monitored for the following issues for this high-risk pregnancy and has Supervision of high-risk pregnancy; Subchorionic hematoma; and LGSIL on Pap smear of cervix on their problem list.  Patient reports  nausea, vomiting and diarrhea that began yesterday. Has been unable to keep down food today but is keeping down water .  Contractions: Not present. Vag. Bleeding: None.   . Denies any leaking of fluid.   The following portions of the patient's history were reviewed and updated as appropriate: allergies, current medications, past family history, past medical history, past social history, past surgical history and problem list.   Objective:  Does not have BP cuff today.  Fetal Status: No fetal movement yet, no bleeding or cramping.  General:  Alert, oriented and cooperative. Patient is in no acute distress.  Respiratory: Normal respiratory effort, no problems with respiration noted  Mental Status: Normal mood and affect. Normal behavior. Normal judgment and thought content.  Rest of physical exam deferred due to type of encounter  Imaging: No results found.  Assessment and Plan:  Pregnancy: [id number] at [redacted]w[redacted]d 1. Supervision  of high risk pregnancy in second trimester (Primary) - Was doing well prior to illness  2. [redacted] weeks gestation of pregnancy - Will offer AFP at next visit (will move up to 2wks)  3. Subchorionic hematoma in second trimester, single or unspecified fetus - No additional bleeding/cramping  4. Viral gastroenteritis - Advised to go to MAU for fluids if she is unble to keep down fluids - ondansetron (ZOFRAN-ODT) 8 MG disintegrating tablet; Take 1 tablet (8 mg total) by mouth every 8 (eight) hours as needed for nausea or vomiting.  Dispense: 20 tablet; Refill: 1  Preterm labor symptoms and general obstetric precautions including but not limited to vaginal bleeding, contractions, leaking of fluid and fetal movement were reviewed in detail with the patient. I discussed the assessment and treatment plan with the patient. The patient was provided an opportunity to ask questions and all were answered. The patient agreed with the plan and demonstrated an understanding of the instructions. The patient was advised to call back or seek an in-person office evaluation/go to MAU at [facility] for any urgent or concerning symptoms. Please refer to After Visit Summary for other counseling recommendations.   I provided 5 minutes of face-to-face time during this encounter.  Return in about 2 weeks (around 09/24/2023) for IN-PERSON, LOB.  Future Appointments  Date Time Provider Department Center  09/24/2023 11:15 AM Osborne Oman [facility]  10/08/2023  8:15 AM [facility] NURSE [facility]  10/08/2023  8:30 AM [facility] US1 [facility]  10/08/2023  9:35 AM Bernerd Limbo, CNM [facility]    Bernerd Limbo, CNM Center for [employer] [employer] [facility]

## 2023-09-10 ENCOUNTER — Ambulatory Visit: Payer: Medicaid Other | Admitting: Certified Nurse Midwife

## 2023-09-10 ENCOUNTER — Encounter: Payer: Self-pay | Admitting: Certified Nurse Midwife

## 2023-09-10 DIAGNOSIS — O468X2 Other antepartum hemorrhage, second trimester: Secondary | ICD-10-CM

## 2023-09-10 DIAGNOSIS — A084 Viral intestinal infection, unspecified: Secondary | ICD-10-CM

## 2023-09-10 DIAGNOSIS — O418X20 Other specified disorders of amniotic fluid and membranes, second trimester, not applicable or unspecified: Secondary | ICD-10-CM | POA: Diagnosis not present

## 2023-09-10 DIAGNOSIS — I471 Supraventricular tachycardia, unspecified: Secondary | ICD-10-CM | POA: Diagnosis not present

## 2023-09-10 DIAGNOSIS — O0992 Supervision of high risk pregnancy, unspecified, second trimester: Secondary | ICD-10-CM

## 2023-09-10 DIAGNOSIS — O9882 Other maternal infectious and parasitic diseases complicating childbirth: Secondary | ICD-10-CM | POA: Diagnosis not present

## 2023-09-10 DIAGNOSIS — Z3A15 15 weeks gestation of pregnancy: Secondary | ICD-10-CM

## 2023-09-10 MED ORDER — ONDANSETRON 8 MG PO TBDP: 8.0000 mg | ORAL_TABLET | Freq: Three times a day (TID) | ORAL | 1 refills | Status: DC | PRN

## 2023-09-12 ENCOUNTER — Ambulatory Visit (INDEPENDENT_AMBULATORY_CARE_PROVIDER_SITE_OTHER): Payer: Medicaid Other | Admitting: Cardiology

## 2023-09-12 ENCOUNTER — Encounter: Payer: Self-pay | Admitting: Cardiology

## 2023-09-12 VITALS — BP 126/70 | HR 90 | Ht 67.0 in | Wt 210.0 lb

## 2023-09-12 DIAGNOSIS — R002 Palpitations: Secondary | ICD-10-CM | POA: Diagnosis not present

## 2023-09-12 DIAGNOSIS — R0789 Other chest pain: Secondary | ICD-10-CM | POA: Diagnosis not present

## 2023-09-12 DIAGNOSIS — R0602 Shortness of breath: Secondary | ICD-10-CM

## 2023-09-12 NOTE — Patient Instructions (Signed)
Medication Instructions:  Your physician recommends that you continue on your current medications as directed. Please refer to the Current Medication list given to you today.  *If you need a refill on your cardiac medications before your next appointment, please call your pharmacy*  Testing/Procedures: Your physician has requested that you have an echocardiogram - OB. Echocardiography is a painless test that uses sound waves to create images of your heart. It provides your doctor with information about the size and shape of your heart and how well your heart's chambers and valves are working. This procedure takes approximately one hour. There are no restrictions for this procedure. Please do NOT wear cologne, perfume, aftershave, or lotions (deodorant is allowed). Please arrive 15 minutes prior to your appointment time.  Please note: We ask at that you not bring children with you during ultrasound (echo/ vascular) testing. Due to room size and safety concerns, children are not allowed in the ultrasound rooms during exams. Our front office staff cannot provide observation of children in our lobby area while testing is being conducted. An adult accompanying a patient to their appointment will only be allowed in the ultrasound room at the discretion of the ultrasound technician under special circumstances. We apologize for any inconvenience.  Follow-Up: At [facility], you and your health needs are our priority.  As part of our continuing mission to provide you with exceptional heart care, we have created designated Provider Care Teams.  These Care Teams include your primary Cardiologist (physician) and Advanced Practice Providers (APPs -  Physician Assistants and Nurse Practitioners) who all work together to provide you with the care you need, when you need it.   Your next appointment:   12 week(s)  Provider:   Thomasene Ripple, DO     Other Instructions:

## 2023-09-14 NOTE — Progress Notes (Signed)
Cardio-Obstetrics Clinic  New Evaluation  Date:  09/14/2023   ID:  Frances [name], DOB 1995/12/26, MRN [id number]  PCP:  Erick Alley, DO   [facility] Providers Cardiologist:  Thomasene Ripple, DO  Electrophysiologist:  None       Referring MD: Federico Flake,*   Chief Complaint: "   History of Present Illness:    Frances [name] is a 28 y.o. female [G5P3013] who is being seen today for the evaluation of chest pain  at the request of Federico Flake,*.    She is currently pregnant with her fourth child, presents with new onset chest pain that started a few weeks ago. The pain is described as a constant pressure in the middle of the chest that radiates to the back. The pain is intermittent and comes and goes without any identifiable triggers. The patient denies any similar symptoms in previous pregnancies.  The patient has a family history of heart disease, with her grandmother having had open heart surgery and her mother experiencing heart palpitations. The patient denies any personal history of smoking.  The patient also reports episodes of palpitations, describing it as her heart going really fast. She also experienced transient shortness of breath, which has somewhat improved.  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 She has a two-year-old son, but she tries not to pick him up due to her current condition.  Prior CV Studies Reviewed: The following studies were reviewed today: None today   Past Medical History:  Diagnosis Date   Anemia    on Iron supplements   Asthma 1999   as a child; no current problems   Chronic kidney disease    as a child   Constipation    Gall stones    Gallstones 10/23/2016   10/2016: Seen by [facility], pt states, and plans for PP follow up. Advised her on low fat diet   History of asthma 08/18/2018   As a child, no history of hospitalization/intubation, not on meds     History of kidney disease as a  child 08/18/2018   Nml Creatinine     History of recurrent UTIs 09/16/2016   E.coli in urine at [facility]; RX for amox sent to pharmacy and patient notified via MyChart. Needs TOC 4 weeks after completing treatment.      History of seizure disorder 08/18/2018   As a child, no seizures since childhood     Missed abortion 03/15/2023   Seizures (HCC)    seizure disorder as a child, treated with medication; none since   UTI (urinary tract infection)     Past Surgical History:  Procedure Laterality Date   BACK SURGERY     DILATION AND EVACUATION N/A 03/15/2023   Procedure: DILATATION AND EVACUATION;  Surgeon: Myna Hidalgo, DO;  Location: [facility];  Service: Gynecology;  Laterality: N/A;   IRRIGATION AND DEBRIDEMENT ABSCESS Left 06/11/2017   Procedure: IRRIGATION, DRAINAGE AND DEBRIDEMENT LEFT FLANK NECROTIZING SOFT TISSUE INFECTION;  Surgeon: Avel Peace, MD;  Location: [facility]S;  Service: General;  Laterality: Left;   OPERATIVE ULTRASOUND N/A 03/15/2023   Procedure: OPERATIVE ULTRASOUND;  Surgeon: Myna Hidalgo, DO;  Location: [facility] OR;  Service: Gynecology;  Laterality: N/A;   TYMPANOSTOMY TUBE PLACEMENT     WOUND DEBRIDEMENT Left 06/12/2017   Procedure: DRESSING CHANGE AND DEBRIDEMENT OF WOUND;  Surgeon: Avel Peace, MD;  Location: [facility];  Service: General;  Laterality: Left;      OB History  Gravida  5   Para  3   Term  3   Preterm  0   AB  1   Living  3      SAB  1   IAB  0   Ectopic  0   Multiple  0   Live Births  3               Current Medications: Current Meds  Medication Sig   Blood Pressure Monitoring (BLOOD PRESSURE KIT) DEVI 1 each by Does not apply route as needed.   ondansetron (ZOFRAN-ODT) 8 MG disintegrating tablet Take 1 tablet (8 mg total) by mouth every 8 (eight) hours as needed for nausea or vomiting.   Prenatal Vit-Fe Fumarate-FA (MULTIVITAMIN-PRENATAL) 27-0.8 MG TABS tablet Take 1 tablet by mouth daily at 12 noon.     Allergies:    Vancomycin   Social History   Socioeconomic History   Marital status: Single    Spouse name: Not on file   Number of children: 1   Years of education: 12   Highest education level: High school graduate  Occupational History   Not on file  Tobacco Use   Smoking status: Never   Smokeless tobacco: Never  Vaping Use   Vaping status: Never Used  Substance and Sexual Activity   Alcohol use: No   Drug use: Not Currently    Types: Marijuana    Comment: last used 05/2018   Sexual activity: Yes    Birth control/protection: None  Other Topics Concern   Not on file  Social History Narrative   Pt received Crib from [employer] [facility] [employer] on 01/17/2021.   Social Drivers of Corporate investment banker Strain: Not on file  Food Insecurity: No Food Insecurity (08/14/2023)   Hunger Vital Sign    Worried About Running Out of Food in the Last Year: Never true    Ran Out of Food in the Last Year: Never true  Transportation Needs: No Transportation Needs (08/14/2023)   PRAPARE - Administrator, Civil Service (Medical): No    Lack of Transportation (Non-Medical): No  Physical Activity: Not on file  Stress: Not on file  Social Connections: Not on file      Family History  Problem Relation Age of Onset   Heart disease Mother        ? arrythmia   Asthma Mother    Hypertension Mother    Intellectual disability Father    Asthma Sister    Birth defects Sister        Down's syndrome      ROS:   Please see the history of present illness.    Chest pain  All other systems reviewed and are negative.   Labs/EKG Reviewed:    EKG:   EKG was not ordered today.   Recent Labs: 12/13/2022: TSH 0.933 08/14/2023: Hemoglobin 11.4; Platelets 281 09/02/2023: ALT 19; B Natriuretic Peptide 27.7; BUN 8; Creatinine, Ser 0.53; Magnesium 1.9; Potassium 3.3; Sodium 135   Recent Lipid Panel No results found for: "CHOL", "TRIG", "HDL", "CHOLHDL", "LDLCALC", "LDLDIRECT"  Physical Exam:     VS:  BP 126/70 (BP Location: Left Arm, Patient Position: Sitting, Cuff Size: Normal)   Pulse 90   Ht 5\' 7"  (1.702 m)   Wt 210 lb (95.3 kg)   LMP 05/24/2023 (Exact Date)   SpO2 99%   BMI 32.89 kg/m     Wt Readings from Last 3 Encounters:  09/12/23 210 lb (95.3 kg)  09/02/23 211 lb (95.7 kg)  08/14/23 208 lb 1.6 oz (94.4 kg)     GEN:  Well nourished, well developed in no acute distress HEENT: Normal NECK: No JVD; No carotid bruits LYMPHATICS: No lymphadenopathy CARDIAC: RRR, no murmurs, rubs, gallops RESPIRATORY:  Clear to auscultation without rales, wheezing or rhonchi  ABDOMEN: Soft, non-tender, non-distended MUSCULOSKELETAL:  No edema; No deformity  SKIN: Warm and dry NEUROLOGIC:  Alert and oriented x 3 PSYCHIATRIC:  Normal affect    Risk Assessment/Risk Calculators:     CARPREG II Risk Prediction Index Score:  1.  The patient's risk for a primary cardiac event is 5%.   Modified World Health Organization [facility]) Classification of Maternal CV Risk   Class I         ASSESSMENT & PLAN:   Chest Pain New onset, atypical chest pain with radiation to the back, intermittent in nature, described as a constant pressure. No clear precipitating factors. Family history of heart disease. Currently pregnant with her fourth child, no similar symptoms in previous pregnancies. -Order an echocardiogram to rule out cardiac etiology.  Palpitations Reports feeling of heart racing, no reported skipped beats. Family history of heart rhythm abnormalities. -Initiate heart monitor for two weeks to evaluate for arrhythmias.  Shortness of Breath Transient episodes, somewhat improved. Currently pregnant. -Monitor symptoms, reassess at follow-up visit.  Blood Pressure Currently without a home blood pressure monitor. -Advise patient to follow up with pharmacy to obtain home blood pressure monitor.  Follow-up in 12 weeks to discuss results of heart monitor and echocardiogram, and to  reassess symptoms.   Patient Instructions  Medication Instructions:  Your physician recommends that you continue on your current medications as directed. Please refer to the Current Medication list given to you today.  *If you need a refill on your cardiac medications before your next appointment, please call your pharmacy*  Testing/Procedures: Your physician has requested that you have an echocardiogram - OB. Echocardiography is a painless test that uses sound waves to create images of your heart. It provides your doctor with information about the size and shape of your heart and how well your heart's chambers and valves are working. This procedure takes approximately one hour. There are no restrictions for this procedure. Please do NOT wear cologne, perfume, aftershave, or lotions (deodorant is allowed). Please arrive 15 minutes prior to your appointment time.  Please note: We ask at that you not bring children with you during ultrasound (echo/ vascular) testing. Due to room size and safety concerns, children are not allowed in the ultrasound rooms during exams. Our front office staff cannot provide observation of children in our lobby area while testing is being conducted. An adult accompanying a patient to their appointment will only be allowed in the ultrasound room at the discretion of the ultrasound technician under special circumstances. We apologize for any inconvenience.  Follow-Up: At [facility], you and your health needs are our priority.  As part of our continuing mission to provide you with exceptional heart care, we have created designated Provider Care Teams.  These Care Teams include your primary Cardiologist (physician) and Advanced Practice Providers (APPs -  Physician Assistants and Nurse Practitioners) who all work together to provide you with the care you need, when you need it.   Your next appointment:   12 week(s)  Provider:   Thomasene Ripple, DO     Other  Instructions:     Dispo:  No follow-ups  on file.   Medication Adjustments/Labs and Tests Ordered: Current medicines are reviewed at length with the patient today.  Concerns regarding medicines are outlined above.  Tests Ordered: Orders Placed This Encounter  Procedures   ECHOCARDIOGRAM COMPLETE   Medication Changes: No orders of the defined types were placed in this encounter.

## 2023-09-18 ENCOUNTER — Encounter: Payer: Self-pay | Admitting: Certified Nurse Midwife

## 2023-09-24 ENCOUNTER — Other Ambulatory Visit: Payer: Self-pay

## 2023-09-24 ENCOUNTER — Ambulatory Visit (INDEPENDENT_AMBULATORY_CARE_PROVIDER_SITE_OTHER): Payer: Medicaid Other | Admitting: Certified Nurse Midwife

## 2023-09-24 VITALS — BP 114/74 | HR 93 | Wt 212.7 lb

## 2023-09-24 DIAGNOSIS — Z3A17 17 weeks gestation of pregnancy: Secondary | ICD-10-CM

## 2023-09-24 DIAGNOSIS — O0990 Supervision of high risk pregnancy, unspecified, unspecified trimester: Secondary | ICD-10-CM | POA: Diagnosis not present

## 2023-09-24 DIAGNOSIS — R87612 Low grade squamous intraepithelial lesion on cytologic smear of cervix (LGSIL): Secondary | ICD-10-CM | POA: Diagnosis not present

## 2023-09-24 DIAGNOSIS — Z3492 Encounter for supervision of normal pregnancy, unspecified, second trimester: Secondary | ICD-10-CM | POA: Diagnosis not present

## 2023-09-24 NOTE — Progress Notes (Signed)
   PRENATAL VISIT NOTE  Subjective:  Frances [name] is a 28 y.o. [phone] at [redacted]w[redacted]d being seen today for ongoing prenatal care.  She is currently monitored for the following issues for this low-risk pregnancy and has Supervision of low-risk pregnancy; Subchorionic hematoma; and LGSIL on Pap smear of cervix on their problem list.  Patient reports no complaints.  Contractions: Not present. Vag. Bleeding: None.  Movement: Present. Denies leaking of fluid.   The following portions of the patient's history were reviewed and updated as appropriate: allergies, current medications, past family history, past medical history, past social history, past surgical history and problem list.   Objective:   Vitals:   09/24/23 1146  BP: 114/74  Pulse: 93  Weight: 212 lb 11.2 oz (96.5 kg)   Fetal Status: Fetal Heart Rate (bpm): 145   Movement: Present     General:  Alert, oriented and cooperative. Patient is in no acute distress.  Skin: Skin is warm and dry. No rash noted.   Cardiovascular: Normal heart rate noted  Respiratory: Normal respiratory effort, no problems with respiration noted  Abdomen: Soft, gravid, appropriate for gestational age.  Pain/Pressure: Absent     Pelvic: Cervical exam deferred        Extremities: Normal range of motion.  Edema: None  Mental Status: Normal mood and affect. Normal behavior. Normal judgment and thought content.   Assessment and Plan:  Pregnancy: [id number] at [redacted]w[redacted]d 1. Encounter for supervision of low-risk pregnancy in second trimester (Primary) - Doing well overall  2. [redacted] weeks gestation of pregnancy - Routine OB care  - AFP, Serum, Open Spina Bifida  3. LGSIL on Pap smear of cervix - Noted on pap, reflex was ordered but not run. Lab unable to re-run sample, will recollect at next visit with reflex HPV to determine next steps. Per [facility] if +HPV, needs colpo postpartum. If -HPV, just needs repeat pap in one year.  Preterm labor symptoms and general obstetric  precautions including but not limited to vaginal bleeding, contractions, leaking of fluid and fetal movement were reviewed in detail with the patient. Please refer to After Visit Summary for other counseling recommendations.   Return in about 2 weeks (around 10/08/2023) for IN-PERSON, LOB.  Future Appointments  Date Time Provider Department Center  10/07/2023  3:05 PM [facility] ECHO 5 [id number]  10/08/2023  8:15 AM [facility] NURSE [facility]  10/08/2023  8:30 AM [facility] US1 [facility]  10/08/2023  9:35 AM Bernerd Limbo, CNM [facility]  11/27/2023  4:20 PM Tobb, Lavona Mound, DO [facility] None    Bernerd Limbo, CNM

## 2023-09-26 LAB — AFP, SERUM, OPEN SPINA BIFIDA
AFP MoM: 0.75
AFP Value: 26.4 ng/mL
Gest. Age on Collection Date: 17.4 wk
Maternal Age At EDD: 25.7 a
OSBR Risk 1 IN: 10000
Test Results:: NEGATIVE
Weight: 213 [lb_av]

## 2023-10-03 DIAGNOSIS — O99210 Obesity complicating pregnancy, unspecified trimester: Secondary | ICD-10-CM | POA: Insufficient documentation

## 2023-10-03 HISTORY — DX: Obesity complicating pregnancy, unspecified trimester: O99.210

## 2023-10-06 ENCOUNTER — Ambulatory Visit: Payer: Medicaid Other

## 2023-10-07 ENCOUNTER — Ambulatory Visit (HOSPITAL_COMMUNITY): Payer: Medicaid Other | Attending: Cardiology

## 2023-10-07 DIAGNOSIS — R0789 Other chest pain: Secondary | ICD-10-CM | POA: Diagnosis not present

## 2023-10-07 LAB — ECHOCARDIOGRAM COMPLETE
Area-P 1/2: 5.16 cm2
S' Lateral: 3.1 cm

## 2023-10-07 NOTE — Progress Notes (Signed)
   PRENATAL VISIT NOTE  Subjective:  Frances [name] is a 28 y.o. [phone] at [redacted]w[redacted]d being seen today for ongoing prenatal care.  She is currently monitored for the following issues for this low-risk pregnancy and has Supervision of low-risk pregnancy and LGSIL on Pap smear of cervix on their problem list.  Patient reports no complaints.  Contractions: Not present. Vag. Bleeding: None.  Movement: Present. Denies leaking of fluid.   The following portions of the patient's history were reviewed and updated as appropriate: allergies, current medications, past family history, past medical history, past social history, past surgical history and problem list.   Objective:   Vitals:   10/08/23 1023  BP: 122/79  Pulse: 90  Weight: 210 lb 1.6 oz (95.3 kg)    Fetal Status: Fetal Heart Rate (bpm): 145   Movement: Present     General:  Alert, oriented and cooperative. Patient is in no acute distress.  Skin: Skin is warm and dry. No rash noted.   Cardiovascular: Normal heart rate noted  Respiratory: Normal respiratory effort, no problems with respiration noted  Abdomen: Soft, gravid, appropriate for gestational age.  Pain/Pressure: Absent     Pelvic: Normal external female genitalia with physiologic discharge. Pap collected in the presence of a chaperone.         Extremities: Normal range of motion.  Edema: None  Mental Status: Normal mood and affect. Normal behavior. Normal judgment and thought content.   Assessment and Plan:  Pregnancy: [id number] at [redacted]w[redacted]d 1. Encounter for supervision of low-risk pregnancy in second trimester (Primary) - Doing well, feeling fetal movement   2. [redacted] weeks gestation of pregnancy - Routine OB care   3. LGSIL on Pap smear of cervix - Will follow and manage accordingly  Preterm labor symptoms and general obstetric precautions including but not limited to vaginal bleeding, contractions, leaking of fluid and fetal movement were reviewed in detail with the  patient. Please refer to After Visit Summary for other counseling recommendations.   Return in about 4 weeks (around 11/05/2023) for IN-PERSON, LOB.  Future Appointments  Date Time Provider Department Center  11/05/2023  9:15 AM [facility] [place]  11/12/2023  2:15 PM [facility] NURSE [facility]  11/12/2023  2:30 PM [facility] US1 [facility]  11/27/2023  4:20 PM Tobb, Lavona Mound, DO [facility] None   Bernerd Limbo, CNM

## 2023-10-08 ENCOUNTER — Ambulatory Visit: Payer: Medicaid Other | Admitting: Certified Nurse Midwife

## 2023-10-08 ENCOUNTER — Ambulatory Visit: Payer: Medicaid Other | Admitting: *Deleted

## 2023-10-08 ENCOUNTER — Other Ambulatory Visit (HOSPITAL_COMMUNITY)
Admission: RE | Admit: 2023-10-08 | Discharge: 2023-10-08 | Disposition: A | Discharge status: Home / Self Care | Source: Ambulatory Visit | Attending: Certified Nurse Midwife | Admitting: Certified Nurse Midwife

## 2023-10-08 ENCOUNTER — Ambulatory Visit: Payer: Medicaid Other | Attending: Certified Nurse Midwife

## 2023-10-08 ENCOUNTER — Ambulatory Visit: Payer: Medicaid Other

## 2023-10-08 ENCOUNTER — Ambulatory Visit (HOSPITAL_BASED_OUTPATIENT_CLINIC_OR_DEPARTMENT_OTHER): Payer: Medicaid Other | Admitting: Maternal & Fetal Medicine

## 2023-10-08 ENCOUNTER — Encounter: Payer: Self-pay | Admitting: *Deleted

## 2023-10-08 ENCOUNTER — Other Ambulatory Visit: Payer: Self-pay

## 2023-10-08 ENCOUNTER — Encounter: Payer: Self-pay | Admitting: Cardiology

## 2023-10-08 VITALS — BP 116/64 | HR 97

## 2023-10-08 VITALS — BP 122/79 | HR 90 | Wt 210.1 lb

## 2023-10-08 DIAGNOSIS — O0990 Supervision of high risk pregnancy, unspecified, unspecified trimester: Secondary | ICD-10-CM | POA: Diagnosis present

## 2023-10-08 DIAGNOSIS — Z6831 Body mass index (BMI) 31.0-31.9, adult: Secondary | ICD-10-CM | POA: Diagnosis present

## 2023-10-08 DIAGNOSIS — Z3689 Encounter for other specified antenatal screening: Secondary | ICD-10-CM | POA: Diagnosis present

## 2023-10-08 DIAGNOSIS — O0992 Supervision of high risk pregnancy, unspecified, second trimester: Secondary | ICD-10-CM

## 2023-10-08 DIAGNOSIS — R87612 Low grade squamous intraepithelial lesion on cytologic smear of cervix (LGSIL): Secondary | ICD-10-CM | POA: Diagnosis present

## 2023-10-08 DIAGNOSIS — O4592 Premature separation of placenta, unspecified, second trimester: Secondary | ICD-10-CM

## 2023-10-08 DIAGNOSIS — Z3A19 19 weeks gestation of pregnancy: Secondary | ICD-10-CM

## 2023-10-08 DIAGNOSIS — O418X10 Other specified disorders of amniotic fluid and membranes, first trimester, not applicable or unspecified: Secondary | ICD-10-CM | POA: Diagnosis present

## 2023-10-08 DIAGNOSIS — O418X20 Other specified disorders of amniotic fluid and membranes, second trimester, not applicable or unspecified: Secondary | ICD-10-CM

## 2023-10-08 DIAGNOSIS — Z3492 Encounter for supervision of normal pregnancy, unspecified, second trimester: Secondary | ICD-10-CM

## 2023-10-08 DIAGNOSIS — O468X1 Other antepartum hemorrhage, first trimester: Secondary | ICD-10-CM | POA: Insufficient documentation

## 2023-10-08 DIAGNOSIS — Z362 Encounter for other antenatal screening follow-up: Secondary | ICD-10-CM

## 2023-10-08 DIAGNOSIS — O99212 Obesity complicating pregnancy, second trimester: Secondary | ICD-10-CM

## 2023-10-08 NOTE — Progress Notes (Signed)
 Patient information  Patient Name: Frances [name]  Patient MRN:   [id number]  Referring practice: MFM Referring Provider: [facility])  MFM CONSULT  Frances [name] is a 28 y.o. [phone] at [redacted]w[redacted]d here for ultrasound and consultation. Patient Active Problem List   Diagnosis Date Noted   LGSIL on Pap smear of cervix 08/20/2023   Subchorionic hematoma 08/14/2023   Supervision of low-risk pregnancy 08/07/2023   Frances [name] is doing well today with no acute concerns. She denies bleeding or cramping.   RE subchorionic hematoma: On the patient's first trimester ultrasound there were 2 large bilateral subchorionic hematomas that appeared to have resolved based on today's ultrasound.  I discussed the diagnosis, management and prognosis of this medical condition.  The clinical concerns include but are not limited to future bleeding risk, placental abruption and stillbirth/miscarriage in the event of a complete or major abruption.  We discussed the need for future growth ultrasounds to ensure the placenta is adequately meeting the growth needs to the fetus.  We discussed the importance of monitoring for signs or symptoms of worsening abruption including bleeding, cramping and contraction-like pain.   Sonographic findings Single intrauterine pregnancy at 19w 3d  Fetal cardiac activity:  Observed and appears normal. Presentation: Cephalic. The anatomic structures that were well seen appear normal without evidence of soft markers. Due to poor acoustic windows some structures remain suboptimally visualized. Fetal biometry shows the estimated fetal weight at the 83 percentile.  Amniotic fluid: Within normal limits.  MVP: 3.66 cm. Placenta: Anterior. Adnexa: No abnormality visualized. Cervical length: 3.4 cm.  There are limitations of prenatal ultrasound such as the inability to detect certain abnormalities due to poor visualization. Various factors such as fetal  position, gestational age and maternal body habitus may increase the difficulty in visualizing the fetal anatomy.    Recommendations -EDD should be 02/29/2024 based on  U/S G S  (07/03/23). -Follow up ultrasound in 4-6 weeks to attempt visualization of the anatomy not seen and reassess the fetal growth. -Bleeding/pain precautions given. -No need for pelvic or activity restriction unless there are concerns for active bleeding.  -Continue routine prenatal care with referring OB provider.  Review of Systems: A review of systems was performed and was negative except per HPI   Vitals and Physical Exam    10/08/2023   10:23 AM 10/08/2023    8:12 AM 09/24/2023   11:46 AM  Vitals with BMI  Weight 210 lbs 2 oz  212 lbs 11 oz  BMI   33.31  Systolic  116 114  Diastolic  64 74  Pulse  97 93    Sitting comfortably on the sonogram table Nonlabored breathing Normal rate and rhythm Abdomen is nontender  Past pregnancies OB History  Gravida Para Term Preterm AB Living  5 3 3  0 1 3  SAB IAB Ectopic Multiple Live Births  1 0 0 0 3    # Outcome Date GA Lbr Len/2nd Weight Sex Type Anes PTL Lv  5 Current           4 SAB 03/15/23 [redacted]w[redacted]d   U SAB     3 Term 02/23/21 [redacted]w[redacted]d 02:30 / 00:08 5 lb 15.1 oz (2.696 kg) M Vag-Spont EPI  LIV     Birth Comments: IOL growth restrictions  2 Term 02/22/19 [redacted]w[redacted]d  6 lb 6.8 oz (2.914 kg) F Vag-Spont None  LIV     Birth Comments: wnl, precipitous delivery- delivery at home  1 Term 02/28/17 [redacted]w[redacted]d 18:20 / 00:27 8 lb 1.8 oz (3.68 kg) F Vag-Spont EPI  LIV     Birth Comments: WNL     I spent 30 minutes reviewing the patients chart, including labs and images as well as counseling the patient about her medical conditions. Greater than 50% of the time was spent in direct face-to-face patient counseling.  Braxton Feathers, DO Maternal fetal medicine, [place]   10/08/2023  10:25 AM

## 2023-10-08 NOTE — Progress Notes (Signed)
   PRENATAL VISIT NOTE  Subjective:  Frances [name] is a 28 y.o. [phone] at [redacted]w[redacted]d being seen today for ongoing prenatal care.  She is currently monitored for the following issues for this low-risk pregnancy and has Supervision of low-risk pregnancy; Subchorionic hematoma; and LGSIL on Pap smear of cervix on their problem list.  Patient reports no complaints.  Contractions: Not present. Vag. Bleeding: None.  Movement: Present. Denies leaking of fluid.   The following portions of the patient's history were reviewed and updated as appropriate: allergies, current medications, past family history, past medical history, past social history, past surgical history and problem list.   Objective:   Vitals:   10/08/23 1023  BP: 122/79  Pulse: 90  Weight: 95.3 kg    Fetal Status: Fetal Heart Rate (bpm): 145   Movement: Present     General:  Alert, oriented and cooperative. Patient is in no acute distress.  Skin: Skin is warm and dry. No rash noted.   Cardiovascular: Normal heart rate noted  Respiratory: Normal respiratory effort, no problems with respiration noted  Abdomen: Soft, gravid, appropriate for gestational age.  Pain/Pressure: Absent     Pelvic: Cervical exam deferred        Extremities: Normal range of motion.  Edema: None  Mental Status: Normal mood and affect. Normal behavior. Normal judgment and thought content.   Assessment and Plan:  Pregnancy: [id number] at [redacted]w[redacted]d 1. Encounter for supervision of low-risk pregnancy in second trimester (Primary) -Doing well, reports positive fetal movement.  2. [redacted] weeks gestation of pregnancy -Routine OB care  3. LGSIL on Pap smear of cervix - Cytology - PAP( ) performed today with reflex HPV. Will follow-up based on results.  Preterm labor symptoms and general obstetric precautions including but not limited to vaginal bleeding, contractions, leaking of fluid and fetal movement were reviewed in detail with the patient. Please refer  to After Visit Summary for other counseling recommendations.   No follow-ups on file.  Future Appointments  Date Time Provider Department Center  11/12/2023  2:15 PM [facility] NURSE [facility]  11/12/2023  2:30 PM [facility] US1 [facility]  11/27/2023  4:20 PM Tobb, Lavona Mound, DO [facility] None    Elige Ko, Student-MidWife

## 2023-10-10 ENCOUNTER — Encounter: Payer: Self-pay | Admitting: Certified Nurse Midwife

## 2023-10-13 LAB — CYTOLOGY - PAP
Comment: NEGATIVE
Comment: NEGATIVE
Comment: NEGATIVE
HPV 16: NEGATIVE
HPV 18 / 45: NEGATIVE
High risk HPV: POSITIVE — AB

## 2023-10-20 ENCOUNTER — Encounter: Payer: Self-pay | Admitting: Certified Nurse Midwife

## 2023-10-21 ENCOUNTER — Encounter: Payer: Self-pay | Admitting: Certified Nurse Midwife

## 2023-11-05 ENCOUNTER — Ambulatory Visit: Payer: Self-pay | Admitting: Certified Nurse Midwife

## 2023-11-05 ENCOUNTER — Other Ambulatory Visit: Payer: Self-pay

## 2023-11-05 VITALS — BP 121/65 | HR 93 | Wt 216.6 lb

## 2023-11-05 DIAGNOSIS — R87612 Low grade squamous intraepithelial lesion on cytologic smear of cervix (LGSIL): Secondary | ICD-10-CM | POA: Diagnosis not present

## 2023-11-05 DIAGNOSIS — Z3A23 23 weeks gestation of pregnancy: Secondary | ICD-10-CM | POA: Diagnosis not present

## 2023-11-05 DIAGNOSIS — Z3492 Encounter for supervision of normal pregnancy, unspecified, second trimester: Secondary | ICD-10-CM

## 2023-11-05 DIAGNOSIS — O418X20 Other specified disorders of amniotic fluid and membranes, second trimester, not applicable or unspecified: Secondary | ICD-10-CM | POA: Diagnosis not present

## 2023-11-05 DIAGNOSIS — O468X2 Other antepartum hemorrhage, second trimester: Secondary | ICD-10-CM | POA: Diagnosis not present

## 2023-11-05 NOTE — Progress Notes (Signed)
   PRENATAL VISIT NOTE  Subjective:  Frances [name] is a 28 y.o. [phone] at [redacted]w[redacted]d being seen today for ongoing prenatal care.  She is currently monitored for the following issues for this low-risk pregnancy and has Supervision of low-risk pregnancy and LGSIL on Pap smear of cervix on their problem list.  Patient reports no complaints.  Contractions: Not present. Vag. Bleeding: None.  Movement: Present. Denies leaking of fluid.   The following portions of the patient's history were reviewed and updated as appropriate: allergies, current medications, past family history, past medical history, past social history, past surgical history and problem list.   Objective:   Vitals:   11/05/23 0925  BP: 121/65  Pulse: 93  Weight: 216 lb 9.6 oz (98.2 kg)    Fetal Status: Fetal Heart Rate (bpm): 156 Fundal Height: 23 cm Movement: Present     General:  Alert, oriented and cooperative. Patient is in no acute distress.  Skin: Skin is warm and dry. No rash noted.   Cardiovascular: Normal heart rate noted  Respiratory: Normal respiratory effort, no problems with respiration noted  Abdomen: Soft, gravid, appropriate for gestational age.  Pain/Pressure: Absent     Pelvic: Cervical exam deferred        Extremities: Normal range of motion.  Edema: Trace (mild swelling in ankles per pt)  Mental Status: Normal mood and affect. Normal behavior. Normal judgment and thought content.   Assessment and Plan:  Pregnancy: [id number] at [redacted]w[redacted]d 1. Encounter for supervision of low-risk pregnancy in second trimester (Primary) - Doing well, feeling regular and vigorous fetal movement   2. [redacted] weeks gestation of pregnancy - Routine OB care including anticipatory guidance re GTT at next visit  3. Subchorionic hematoma in second trimester, single or unspecified fetus - Resolved on last U/S, has follow up growth scan scheduled  4. LGSIL on Pap smear of cervix - Will need colpo pp, reviewed procedure, will refer to Dr.  Para March when appropriate   Preterm labor symptoms and general obstetric precautions including but not limited to vaginal bleeding, contractions, leaking of fluid and fetal movement were reviewed in detail with the patient. Please refer to After Visit Summary for other counseling recommendations.   Return in about 3 weeks (around 11/26/2023) for IN-PERSON, LOB.  Future Appointments  Date Time Provider Department Center  11/12/2023  2:00 PM [facility] PROVIDER 1 [facility]  11/12/2023  2:30 PM [facility] US1 [facility]  11/27/2023  4:20 PM Tobb, Kardie, DO [facility] None  12/03/2023  8:15 AM Bernerd Limbo, CNM [facility]  12/03/2023  8:50 AM [facility]    Bernerd Limbo, CNM

## 2023-11-12 ENCOUNTER — Ambulatory Visit: Payer: Medicaid Other | Attending: Maternal & Fetal Medicine

## 2023-11-12 ENCOUNTER — Ambulatory Visit: Admitting: Maternal & Fetal Medicine

## 2023-11-12 ENCOUNTER — Other Ambulatory Visit: Payer: Self-pay | Admitting: *Deleted

## 2023-11-12 ENCOUNTER — Ambulatory Visit: Payer: Medicaid Other

## 2023-11-12 VITALS — BP 117/68 | HR 93

## 2023-11-12 DIAGNOSIS — O4592 Premature separation of placenta, unspecified, second trimester: Secondary | ICD-10-CM | POA: Diagnosis not present

## 2023-11-12 DIAGNOSIS — Z3A24 24 weeks gestation of pregnancy: Secondary | ICD-10-CM

## 2023-11-12 DIAGNOSIS — O418X20 Other specified disorders of amniotic fluid and membranes, second trimester, not applicable or unspecified: Secondary | ICD-10-CM | POA: Diagnosis not present

## 2023-11-12 DIAGNOSIS — Z362 Encounter for other antenatal screening follow-up: Secondary | ICD-10-CM | POA: Diagnosis present

## 2023-11-12 DIAGNOSIS — Z3689 Encounter for other specified antenatal screening: Secondary | ICD-10-CM

## 2023-11-12 DIAGNOSIS — O99212 Obesity complicating pregnancy, second trimester: Secondary | ICD-10-CM

## 2023-11-12 DIAGNOSIS — E669 Obesity, unspecified: Secondary | ICD-10-CM | POA: Diagnosis not present

## 2023-11-12 DIAGNOSIS — O468X2 Other antepartum hemorrhage, second trimester: Secondary | ICD-10-CM | POA: Insufficient documentation

## 2023-11-12 NOTE — Progress Notes (Unsigned)
error 

## 2023-11-12 NOTE — Progress Notes (Signed)
   Patient information  Patient Name: Frances [name]  Patient MRN:   [id number]  Referring practice: MFM Referring Provider: [facility])  MFM CONSULT  Frances [name] is a 28 y.o. [phone] at [redacted]w[redacted]d here for ultrasound and consultation. Patient Active Problem List   Diagnosis Date Noted   LGSIL on Pap smear of cervix 08/20/2023   Supervision of low-risk pregnancy 08/07/2023    [facility] Frances [name] has a pregnancy with the complications mentioned in the problem list. During today's visit we focused on the following concerns:   RE subchorionic hematoma: I discussed that this appears to have resolved but is a risk factor for placental abruption and potentially abnormal fetal growth.  I reassured the patient that the findings are normal today.  She will return for a growth ultrasound in 6 weeks.  Sonographic findings Single intrauterine pregnancy at 24w 3d.  Fetal cardiac activity:  Observed and appears normal. Presentation: Cephalic. Interval fetal anatomy appears normal. Fetal biometry shows the estimated fetal weight at the 59 percentile. Amniotic fluid volume: Within normal limits. MVP: 6.44 cm. Placenta: Anterior.  There are limitations of prenatal ultrasound such as the inability to detect certain abnormalities due to poor visualization. Various factors such as fetal position, gestational age and maternal body habitus may increase the difficulty in visualizing the fetal anatomy.    Recommendations -Due to the patient's early subchronic hematoma I recommend serial growth ultrasounds every 4 to 8 weeks or sooner if indicated due to the increased risk of placental abruption. -Pain and bleeding precautions given. -Continue routine prenatal care with referring OB provider  Review of Systems: A review of systems was performed and was negative except per HPI   Vitals and Physical Exam    11/12/2023    2:26 PM 11/05/2023    9:25 AM 10/08/2023   10:23 AM  Vitals  with BMI  Weight  216 lbs 10 oz 210 lbs 2 oz  Systolic 117 121 147  Diastolic 68 65 79  Pulse 93 93 90    Sitting comfortably on the sonogram table Nonlabored breathing Normal rate and rhythm Abdomen is nontender  Past pregnancies OB History  Gravida Para Term Preterm AB Living  5 3 3  0 1 3  SAB IAB Ectopic Multiple Live Births  1 0 0 0 3    # Outcome Date GA Lbr Len/2nd Weight Sex Type Anes PTL Lv  5 Current           4 SAB 03/15/23 [redacted]w[redacted]d   U SAB     3 Term 02/23/21 [redacted]w[redacted]d 02:30 / 00:08 5 lb 15.1 oz (2.696 kg) M Vag-Spont EPI  LIV     Birth Comments: IOL growth restrictions  2 Term 02/22/19 [redacted]w[redacted]d  6 lb 6.8 oz (2.914 kg) F Vag-Spont None  LIV     Birth Comments: wnl, precipitous delivery- delivery at home  1 Term 02/28/17 [redacted]w[redacted]d 18:20 / 00:27 8 lb 1.8 oz (3.68 kg) F Vag-Spont EPI  LIV     Birth Comments: WNL     I spent 10 minutes reviewing the patients chart, including labs and images as well as counseling the patient about her medical conditions. Greater than 50% of the time was spent in direct face-to-face patient counseling.  Braxton Feathers, DO Maternal fetal medicine, [place]   11/12/2023  3:46 PM

## 2023-11-27 ENCOUNTER — Encounter: Payer: Self-pay | Admitting: Cardiology

## 2023-11-27 ENCOUNTER — Ambulatory Visit: Payer: Medicaid Other | Attending: Cardiology | Admitting: Cardiology

## 2023-11-27 VITALS — BP 120/68 | HR 90 | Ht 67.0 in | Wt 218.8 lb

## 2023-11-27 DIAGNOSIS — I471 Supraventricular tachycardia, unspecified: Secondary | ICD-10-CM | POA: Diagnosis not present

## 2023-11-27 DIAGNOSIS — Z3A26 26 weeks gestation of pregnancy: Secondary | ICD-10-CM

## 2023-11-27 NOTE — Progress Notes (Signed)
 Cardio-Obstetrics Clinic  Follow Up Note   Date:  11/30/2023   ID:  Frances [name], DOB 1996-08-07, MRN [id number]  PCP:  Glenn Lange, DO   [facility] Providers Cardiologist:  Jerryl Morin, DO  Electrophysiologist:  None        Referring MD: Glenn Lange, DO   Chief Complaint: " I am having shortness of breath"  History of Present Illness:    Frances [name] is a 28 y.o. female [[id number]] who returns for follow up.   She is currently [redacted] weeks pregnant, has been experiencing heart palpitations. She recently had an echocardiogram and wore a heart monitor, which showed one instance of a high heartbeat, but otherwise normal sinus tachycardia. However, due to the one high heartbeat, the patient was advised that she may need to be monitored for supraventricular tachycardia (SVT) in the future. The patient reports feeling tired, which is also expected during pregnancy.   Prior CV Studies Reviewed: The following studies were reviewed today: Echo and zio  Past Medical History:  Diagnosis Date   Anemia    on Iron  supplements   Asthma 1999   as a child; no current problems   Chronic kidney disease    as a child   Constipation    Gall stones    Gallstones 10/23/2016   10/2016: Seen by [facility], pt states, and plans for PP follow up. Advised her on low fat diet   History of asthma 08/18/2018   As a child, no history of hospitalization/intubation, not on meds     History of kidney disease as a child 08/18/2018   Nml Creatinine     History of recurrent UTIs 09/16/2016   E.coli in urine at [facility]; RX for amox sent to pharmacy and patient notified via MyChart. Needs TOC 4 weeks after completing treatment.      History of seizure disorder 08/18/2018   As a child, no seizures since childhood     Missed abortion 03/15/2023   Obesity affecting pregnancy 10/03/2023   Seizures (HCC)    seizure disorder as a child, treated with medication; none since   Subchorionic hematoma 08/14/2023    Moderate, diagnosed in MAU on 12/30     UTI (urinary tract infection)     Past Surgical History:  Procedure Laterality Date   BACK SURGERY     DILATION AND EVACUATION N/A 03/15/2023   Procedure: DILATATION AND EVACUATION;  Surgeon: Ozan, Jennifer, DO;  Location: [facility] OR;  Service: Gynecology;  Laterality: N/A;   IRRIGATION AND DEBRIDEMENT ABSCESS Left 06/11/2017   Procedure: IRRIGATION, DRAINAGE AND DEBRIDEMENT LEFT FLANK NECROTIZING SOFT TISSUE INFECTION;  Surgeon: Adalberto Hollow, MD;  Location: [facility] ORS;  Service: General;  Laterality: Left;   OPERATIVE ULTRASOUND N/A 03/15/2023   Procedure: OPERATIVE ULTRASOUND;  Surgeon: Keene Pastures, DO;  Location: [facility] OR;  Service: Gynecology;  Laterality: N/A;   TYMPANOSTOMY TUBE PLACEMENT     WOUND DEBRIDEMENT Left 06/12/2017   Procedure: DRESSING CHANGE AND DEBRIDEMENT OF WOUND;  Surgeon: Adalberto Hollow, MD;  Location: [facility] ORS;  Service: General;  Laterality: Left;      OB History     Gravida  5   Para  3   Term  3   Preterm  0   AB  1   Living  3      SAB  1   IAB  0   Ectopic  0   Multiple  0   Live Births  3  Current Medications: Current Meds  Medication Sig   Blood Pressure Monitoring (BLOOD PRESSURE KIT) DEVI 1 each by Does not apply route as needed.   ondansetron  (ZOFRAN -ODT) 8 MG disintegrating tablet Take 1 tablet (8 mg total) by mouth every 8 (eight) hours as needed for nausea or vomiting.   Prenatal Vit-Fe Fumarate-FA (MULTIVITAMIN-PRENATAL) 27-0.8 MG TABS tablet Take 1 tablet by mouth daily at 12 noon.     Allergies:   Vancomycin    Social History   Socioeconomic History   Marital status: Single    Spouse name: Not on file   Number of children: 1   Years of education: 12   Highest education level: High school graduate  Occupational History   Not on file  Tobacco Use   Smoking status: Never   Smokeless tobacco: Never  Vaping Use   Vaping status: Never Used  Substance and Sexual  Activity   Alcohol use: No   Drug use: Not Currently    Types: Marijuana    Comment: last used 05/2018   Sexual activity: Yes    Birth control/protection: None  Other Topics Concern   Not on file  Social History Narrative   Pt received Crib from [employer] on 01/17/2021.   Social Drivers of Corporate investment banker Strain: Not on file  Food Insecurity: No Food Insecurity (08/14/2023)   Hunger Vital Sign    Worried About Running Out of Food in the Last Year: Never true    Ran Out of Food in the Last Year: Never true  Transportation Needs: No Transportation Needs (08/14/2023)   PRAPARE - Administrator, Civil Service (Medical): No    Lack of Transportation (Non-Medical): No  Physical Activity: Not on file  Stress: Not on file  Social Connections: Not on file      Family History  Problem Relation Age of Onset   Heart disease Mother        ? arrythmia   Asthma Mother    Hypertension Mother    Intellectual disability Father    Asthma Sister    Birth defects Sister        Down's syndrome      ROS:   Please see the history of present illness.     All other systems reviewed and are negative.   Labs/EKG Reviewed:    EKG:  None today    Recent Labs: 12/13/2022: TSH 0.933 08/14/2023: Hemoglobin 11.4; Platelets 281 09/02/2023: ALT 19; B Natriuretic Peptide 27.7; BUN 8; Creatinine, Ser 0.53; Magnesium 1.9; Potassium 3.3; Sodium 135   Recent Lipid Panel No results found for: "CHOL", "TRIG", "HDL", "CHOLHDL", "LDLCALC", "LDLDIRECT"  Physical Exam:    VS:  BP 120/68 (BP Location: Right Arm, Patient Position: Sitting, Cuff Size: Normal)   Pulse 90   Ht 5\' 7"  (1.702 m)   Wt 218 lb 12.8 oz (99.2 kg)   LMP 05/24/2023 (Exact Date)   SpO2 99%   BMI 34.27 kg/m     Wt Readings from Last 3 Encounters:  11/27/23 218 lb 12.8 oz (99.2 kg)  11/05/23 216 lb 9.6 oz (98.2 kg)  10/08/23 210 lb 1.6 oz (95.3 kg)     GEN:  Well nourished, well developed  in no acute distress HEENT: Normal NECK: No JVD; No carotid bruits LYMPHATICS: No lymphadenopathy CARDIAC: RRR, no murmurs, rubs, gallops RESPIRATORY:  Clear to auscultation without rales, wheezing or rhonchi  ABDOMEN: Soft, non-tender, non-distended MUSCULOSKELETAL:  No edema; No  deformity  SKIN: Warm and dry NEUROLOGIC:  Alert and oriented x 3 PSYCHIATRIC:  Normal affect    Risk Assessment/Risk Calculators:     CARPREG II Risk Prediction Index Score:  1.  The patient's risk for a primary cardiac event is 5%.   Modified World Health Organization [facility]) Classification of Maternal CV Risk   Class I         ASSESSMENT & PLAN:    Supraventricular tachycardia in Pregnancy Echocardiogram normal. Heart monitor showed rare paroxysmal supraventricular tachycardia. No current concern but will monitor for potential SVT in future. Medication not started as symptoms likely pregnancy-related and may resolve postpartum. - Monitor heart rate and symptoms during pregnancy. - Advise increased hydration. - Instruct to lay on left side if lightheaded. - Advise to report syncope. - No medication at this time.  Physiological Heart Murmur in Pregnancy Heart murmur expected during pregnancy, may become louder in third trimester. Not concerning unless other symptoms arise. - Reassure heart murmur is expected. - No further action unless new symptoms develop.  Pregnancy Management [redacted] weeks pregnant with normal symptoms. Planning home delivery. - Advise to stay hydrated. - Encourage rest as needed.   There are no Patient Instructions on file for this visit.   Dispo:  No follow-ups on file.   Medication Adjustments/Labs and Tests Ordered: Current medicines are reviewed at length with the patient today.  Concerns regarding medicines are outlined above.  Tests Ordered: No orders of the defined types were placed in this encounter.  Medication Changes: No orders of the defined types were  placed in this encounter.

## 2023-12-03 ENCOUNTER — Other Ambulatory Visit: Payer: Self-pay

## 2023-12-03 ENCOUNTER — Ambulatory Visit: Admitting: Certified Nurse Midwife

## 2023-12-03 ENCOUNTER — Other Ambulatory Visit

## 2023-12-03 VITALS — BP 102/66 | HR 90 | Wt 220.2 lb

## 2023-12-03 DIAGNOSIS — Z3A27 27 weeks gestation of pregnancy: Secondary | ICD-10-CM

## 2023-12-03 DIAGNOSIS — Z23 Encounter for immunization: Secondary | ICD-10-CM

## 2023-12-03 DIAGNOSIS — Z3492 Encounter for supervision of normal pregnancy, unspecified, second trimester: Secondary | ICD-10-CM

## 2023-12-03 DIAGNOSIS — I471 Supraventricular tachycardia, unspecified: Secondary | ICD-10-CM | POA: Diagnosis not present

## 2023-12-03 NOTE — Progress Notes (Signed)
   PRENATAL VISIT NOTE  Subjective:  Frances [name] is a 28 y.o. [phone] at [redacted]w[redacted]d being seen today for ongoing prenatal care.  She is currently monitored for the following issues for this low-risk pregnancy and has Supervision of low-risk pregnancy; Subchorionic hematoma in second trimester; and LGSIL on Pap smear of cervix on their problem list.  Patient reports no complaints.  Contractions: Not present. Vag. Bleeding: None.  Movement: Present. Denies leaking of fluid.   The following portions of the patient's history were reviewed and updated as appropriate: allergies, current medications, past family history, past medical history, past social history, past surgical history and problem list.   Objective:   Vitals:   12/03/23 0835  BP: 102/66  Pulse: 90  Weight: 99.9 kg    Fetal Status: Fetal Heart Rate (bpm): 151 Fundal Height: 27 cm Movement: Present     General:  Alert, oriented and cooperative. Patient is in no acute distress.  Skin: Skin is warm and dry. No rash noted.   Cardiovascular: Normal heart rate noted  Respiratory: Normal respiratory effort, no problems with respiration noted  Abdomen: Soft, gravid, appropriate for gestational age.  Pain/Pressure: Absent     Pelvic: Cervical exam deferred        Extremities: Normal range of motion.  Edema: None  Mental Status: Normal mood and affect. Normal behavior. Normal judgment and thought content.   Assessment and Plan:  Pregnancy: [id number] at [redacted]w[redacted]d 1. Encounter for supervision of low-risk pregnancy in second trimester (Primary) - Doing well overall. Planning to [facility] birth at [facility].  2. [redacted] weeks gestation of pregnancy - Routine OB Care - 2hr GTT today  3. PSVT (paroxysmal supraventricular tachycardia) (HCC) - Has seen Dr. Emmette Harms. No new medications.   Preterm labor symptoms and general obstetric precautions including but not limited to vaginal bleeding, contractions, leaking of fluid and fetal movement were reviewed in  detail with the patient. Please refer to After Visit Summary for other counseling recommendations.   Return in about 2 weeks (around 12/17/2023).  Future Appointments  Date Time Provider Department Center  12/24/2023  3:00 PM [facility] PROVIDER 1 [facility]  12/24/2023  3:30 PM [facility] US5 [facility]    Wilford Hanks, Student-MidWife

## 2023-12-06 LAB — CBC
Hematocrit: 27.6 % — ABNORMAL LOW (ref 34.0–46.6)
Hemoglobin: 9 g/dL — ABNORMAL LOW (ref 11.1–15.9)
MCH: 28.4 pg (ref 26.6–33.0)
MCHC: 32.6 g/dL (ref 31.5–35.7)
MCV: 87 fL (ref 79–97)
Platelets: 255 10*3/uL (ref 150–450)
RBC: 3.17 x10E6/uL — ABNORMAL LOW (ref 3.77–5.28)
RDW: 11.9 % (ref 11.7–15.4)
WBC: 7.8 10*3/uL (ref 3.4–10.8)

## 2023-12-06 LAB — GLUCOSE TOLERANCE, 2 HOURS W/ 1HR
Glucose, 1 hour: 112 mg/dL (ref 70–179)
Glucose, 2 hour: 92 mg/dL (ref 70–152)
Glucose, Fasting: 73 mg/dL (ref 70–91)

## 2023-12-06 LAB — HIV ANTIBODY (ROUTINE TESTING W REFLEX): HIV Screen 4th Generation wRfx: NONREACTIVE

## 2023-12-06 LAB — RPR: RPR Ser Ql: NONREACTIVE

## 2023-12-08 ENCOUNTER — Telehealth: Payer: Self-pay

## 2023-12-08 ENCOUNTER — Encounter: Payer: Self-pay | Admitting: Certified Nurse Midwife

## 2023-12-08 ENCOUNTER — Other Ambulatory Visit: Payer: Self-pay | Admitting: Certified Nurse Midwife

## 2023-12-08 NOTE — Telephone Encounter (Signed)
 Frances [name], patient will be scheduled as soon as possible.  Auth Submission: NO AUTH NEEDED Site of care: Site of care: [facility] Payer: [facility] Medicaid Medication & CPT/J Code(s) submitted: Feraheme (ferumoxytol) [id number] Route of submission (phone, fax, portal): portal Phone # Fax # Auth type: Buy/Bill PB Units/visits requested: 510mg  x 2 doses Reference number:  Approval from: 12/08/23 to 05/09/24

## 2023-12-15 ENCOUNTER — Ambulatory Visit

## 2023-12-15 VITALS — BP 114/66 | HR 100 | Temp 98.0°F | Resp 14 | Ht 67.0 in | Wt 220.4 lb

## 2023-12-15 DIAGNOSIS — D508 Other iron deficiency anemias: Secondary | ICD-10-CM | POA: Diagnosis not present

## 2023-12-15 DIAGNOSIS — Z3A29 29 weeks gestation of pregnancy: Secondary | ICD-10-CM

## 2023-12-15 DIAGNOSIS — O99019 Anemia complicating pregnancy, unspecified trimester: Secondary | ICD-10-CM

## 2023-12-15 DIAGNOSIS — O99013 Anemia complicating pregnancy, third trimester: Secondary | ICD-10-CM

## 2023-12-15 MED ORDER — SODIUM CHLORIDE 0.9 % IV SOLN
510.0000 mg | Freq: Once | INTRAVENOUS | Status: AC
  Administered 2023-12-15: 510 mg via INTRAVENOUS
  Filled 2023-12-15: qty 17

## 2023-12-15 NOTE — Progress Notes (Signed)
 Diagnosis: Iron  Deficiency Anemia  Provider:  Praveen Mannam MD  Procedure: IV Infusion  IV Type: Peripheral, IV Location: R Forearm  Feraheme (Ferumoxytol), Dose: 510 mg  Infusion Start Time: 1444  Infusion Stop Time: 1507  Post Infusion IV Care: Observation period completed and Peripheral IV Discontinued  Discharge: Condition: Good, Destination: Home . AVS Provided  Performed by:  Miia Blanks, RN

## 2023-12-17 ENCOUNTER — Other Ambulatory Visit: Payer: Self-pay

## 2023-12-17 ENCOUNTER — Ambulatory Visit: Admitting: Certified Nurse Midwife

## 2023-12-17 VITALS — BP 116/76 | HR 93 | Wt 222.0 lb

## 2023-12-17 DIAGNOSIS — O99210 Obesity complicating pregnancy, unspecified trimester: Secondary | ICD-10-CM | POA: Insufficient documentation

## 2023-12-17 DIAGNOSIS — O0993 Supervision of high risk pregnancy, unspecified, third trimester: Secondary | ICD-10-CM | POA: Diagnosis not present

## 2023-12-17 DIAGNOSIS — O99013 Anemia complicating pregnancy, third trimester: Secondary | ICD-10-CM

## 2023-12-17 DIAGNOSIS — O99019 Anemia complicating pregnancy, unspecified trimester: Secondary | ICD-10-CM

## 2023-12-17 DIAGNOSIS — Z3A29 29 weeks gestation of pregnancy: Secondary | ICD-10-CM

## 2023-12-20 NOTE — Progress Notes (Signed)
   PRENATAL VISIT NOTE  Subjective:  Frances [name] is a 28 y.o. [phone] at [redacted]w[redacted]d being seen today for ongoing prenatal care.  She is currently monitored for the following issues for this low-risk pregnancy and has Anemia during pregnancy; Supervision of low-risk pregnancy; Subchorionic hematoma in second trimester; LGSIL on Pap smear of cervix; and Obesity affecting pregnancy, antepartum on their problem list.  Patient reports no complaints.  Contractions: Not present. Vag. Bleeding: None.  Movement: Present. Denies leaking of fluid.   The following portions of the patient's history were reviewed and updated as appropriate: allergies, current medications, past family history, past medical history, past social history, past surgical history and problem list.   Objective:   Vitals:   12/17/23 1122  BP: 116/76  Pulse: 93  Weight: 222 lb (100.7 kg)     Fetal Status: Fetal Heart Rate (bpm): 130   Movement: Present      General: Alert, oriented and cooperative. Patient is in no acute distress.  Skin: Skin is warm and dry. No rash noted.   Cardiovascular: Normal heart rate noted  Respiratory: Normal respiratory effort, no problems with respiration noted  Abdomen: Soft, gravid, appropriate for gestational age.  Pain/Pressure: Absent     Pelvic: Cervical exam deferred        Extremities: Normal range of motion.  Edema: Mild pitting, slight indentation  Mental Status: Normal mood and affect. Normal behavior. Normal judgment and thought content.   Assessment and Plan:  Pregnancy: [id number] at [redacted]w[redacted]d 1. Supervision of high risk pregnancy in third trimester (Primary) - Doing well, feeling regular and vigorous fetal movement   2. [redacted] weeks gestation of pregnancy - Routine OB care   3. Anemia in pregnancy - Has infusions scheduled   Preterm labor symptoms and general obstetric precautions including but not limited to vaginal bleeding, contractions, leaking of fluid and fetal movement were  reviewed in detail with the patient. Please refer to After Visit Summary for other counseling recommendations.   Return in about 2 weeks (around 12/31/2023) for IN-PERSON, LOB.  Future Appointments  Date Time Provider Department Center  12/22/2023  2:00 PM [facility] None  12/24/2023  3:00 PM [facility] PROVIDER 1 [facility]  12/24/2023  3:30 PM [facility] US5 [facility]  12/31/2023 10:55 AM Derick Fleeting, CNM [facility]  01/14/2024  1:55 PM Derick Fleeting, CNM [facility]   Derick Fleeting, CNM

## 2023-12-22 ENCOUNTER — Ambulatory Visit (INDEPENDENT_AMBULATORY_CARE_PROVIDER_SITE_OTHER)

## 2023-12-22 VITALS — BP 112/75 | HR 97 | Temp 98.2°F | Resp 16 | Ht 67.0 in | Wt 225.0 lb

## 2023-12-22 DIAGNOSIS — D508 Other iron deficiency anemias: Secondary | ICD-10-CM

## 2023-12-22 DIAGNOSIS — O99013 Anemia complicating pregnancy, third trimester: Secondary | ICD-10-CM | POA: Diagnosis not present

## 2023-12-22 DIAGNOSIS — O99019 Anemia complicating pregnancy, unspecified trimester: Secondary | ICD-10-CM

## 2023-12-22 DIAGNOSIS — Z3A30 30 weeks gestation of pregnancy: Secondary | ICD-10-CM

## 2023-12-22 MED ORDER — SODIUM CHLORIDE 0.9 % IV SOLN
510.0000 mg | Freq: Once | INTRAVENOUS | Status: AC
  Administered 2023-12-22: 510 mg via INTRAVENOUS
  Filled 2023-12-22: qty 17

## 2023-12-22 NOTE — Progress Notes (Signed)
 Diagnosis: Iron  Deficiency Anemia  Provider:  Praveen Mannam MD  Procedure: IV Infusion  IV Type: Peripheral, IV Location: L Upper Arm  Feraheme (Ferumoxytol ), Dose: 510 mg  Infusion Start Time: 1517  Infusion Stop Time: 1534  Post Infusion IV Care: Observation period completed  Discharge: Condition: Good, Destination: Home . AVS Declined  Performed by:  Natividad Balding, RN

## 2023-12-24 ENCOUNTER — Ambulatory Visit

## 2023-12-24 ENCOUNTER — Ambulatory Visit: Attending: Maternal & Fetal Medicine | Admitting: Maternal & Fetal Medicine

## 2023-12-24 VITALS — BP 135/70 | HR 89

## 2023-12-24 DIAGNOSIS — Z3A30 30 weeks gestation of pregnancy: Secondary | ICD-10-CM | POA: Insufficient documentation

## 2023-12-24 DIAGNOSIS — Z3493 Encounter for supervision of normal pregnancy, unspecified, third trimester: Secondary | ICD-10-CM

## 2023-12-24 DIAGNOSIS — Z362 Encounter for other antenatal screening follow-up: Secondary | ICD-10-CM | POA: Insufficient documentation

## 2023-12-24 DIAGNOSIS — O4593 Premature separation of placenta, unspecified, third trimester: Secondary | ICD-10-CM | POA: Diagnosis not present

## 2023-12-24 DIAGNOSIS — Z3689 Encounter for other specified antenatal screening: Secondary | ICD-10-CM

## 2023-12-24 DIAGNOSIS — Z363 Encounter for antenatal screening for malformations: Secondary | ICD-10-CM | POA: Diagnosis not present

## 2023-12-24 DIAGNOSIS — O468X2 Other antepartum hemorrhage, second trimester: Secondary | ICD-10-CM

## 2023-12-24 DIAGNOSIS — O99213 Obesity complicating pregnancy, third trimester: Secondary | ICD-10-CM | POA: Diagnosis not present

## 2023-12-24 DIAGNOSIS — E669 Obesity, unspecified: Secondary | ICD-10-CM

## 2023-12-24 DIAGNOSIS — O99019 Anemia complicating pregnancy, unspecified trimester: Secondary | ICD-10-CM

## 2023-12-24 DIAGNOSIS — O99212 Obesity complicating pregnancy, second trimester: Secondary | ICD-10-CM

## 2023-12-24 DIAGNOSIS — O418X20 Other specified disorders of amniotic fluid and membranes, second trimester, not applicable or unspecified: Secondary | ICD-10-CM

## 2023-12-24 DIAGNOSIS — O99210 Obesity complicating pregnancy, unspecified trimester: Secondary | ICD-10-CM

## 2023-12-24 NOTE — Progress Notes (Signed)
 After review, MFM consult with provider is not indicated for today  Penney Bowling, DO 12/24/2023 3:42 PM  [facility]

## 2023-12-25 ENCOUNTER — Other Ambulatory Visit: Payer: Self-pay | Admitting: *Deleted

## 2023-12-25 DIAGNOSIS — O418X20 Other specified disorders of amniotic fluid and membranes, second trimester, not applicable or unspecified: Secondary | ICD-10-CM

## 2023-12-31 ENCOUNTER — Other Ambulatory Visit: Payer: Self-pay

## 2023-12-31 ENCOUNTER — Ambulatory Visit: Payer: Self-pay | Admitting: Certified Nurse Midwife

## 2023-12-31 VITALS — BP 130/71 | HR 98 | Wt 226.0 lb

## 2023-12-31 DIAGNOSIS — O0993 Supervision of high risk pregnancy, unspecified, third trimester: Secondary | ICD-10-CM | POA: Diagnosis not present

## 2023-12-31 DIAGNOSIS — Z3A31 31 weeks gestation of pregnancy: Secondary | ICD-10-CM | POA: Diagnosis not present

## 2023-12-31 NOTE — Progress Notes (Signed)
   PRENATAL VISIT NOTE  Subjective:  Frances [name] is a 28 y.o. [phone] at [redacted]w[redacted]d being seen today for ongoing prenatal care.  She is currently monitored for the following issues for this low-risk pregnancy and has Anemia during pregnancy; Supervision of low-risk pregnancy; Subchorionic hematoma in second trimester; LGSIL on Pap smear of cervix; and Obesity affecting pregnancy, antepartum on their problem list.  Patient reports lower extremity edema. 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  Contractions: Not present. Vag. Bleeding: None.  Movement: Present. Denies leaking of fluid.   The following portions of the patient's history were reviewed and updated as appropriate: allergies, current medications, past family history, past medical history, past social history, past surgical history and problem list.   Objective:    Vitals:   12/31/23 1116  BP: 130/71  Pulse: 98  Weight: 102.5 kg    Fetal Status:  Fetal Heart Rate (bpm): 152 Fundal Height: 31 cm Movement: Present    General: Alert, oriented and cooperative. Patient is in no acute distress.  Skin: Skin is warm and dry. No rash noted.   Cardiovascular: Normal heart rate noted  Respiratory: Normal respiratory effort, no problems with respiration noted  Abdomen: Soft, gravid, appropriate for gestational age.  Pain/Pressure: Present (back pain, feeling pressure in pelvis)     Pelvic: Cervical exam deferred        Extremities: Normal range of motion.  Edema: Trace (ankles)  Mental Status: Normal mood and affect. Normal behavior. Normal judgment and thought content.   Assessment and Plan:  Pregnancy: [id number] at [redacted]w[redacted]d 1. Supervision of high risk pregnancy in third trimester (Primary) - Doing well overall. + FM - Recommended compression socks to promote fluid return of lower extremity edema.  2. [redacted] weeks gestation of pregnancy - Routine OB care  Preterm labor symptoms and general obstetric precautions including but  not limited to vaginal bleeding, contractions, leaking of fluid and fetal movement were reviewed in detail with the patient. Please refer to After Visit Summary for other counseling recommendations.     Future Appointments  Date Time Provider Department Center  01/14/2024  1:55 PM Cleophas Dadds [facility]  02/04/2024  2:00 PM [facility] PROVIDER 1 [facility]  02/04/2024  2:30 PM [facility] US1 [facility]    Wilford Hanks, Student-MidWife

## 2024-01-14 ENCOUNTER — Ambulatory Visit: Payer: Self-pay | Admitting: Certified Nurse Midwife

## 2024-01-14 ENCOUNTER — Other Ambulatory Visit: Payer: Self-pay

## 2024-01-14 VITALS — BP 103/73 | HR 100 | Wt 224.0 lb

## 2024-01-14 DIAGNOSIS — Z3A33 33 weeks gestation of pregnancy: Secondary | ICD-10-CM

## 2024-01-14 DIAGNOSIS — Z3493 Encounter for supervision of normal pregnancy, unspecified, third trimester: Secondary | ICD-10-CM

## 2024-01-14 NOTE — Progress Notes (Signed)
   PRENATAL VISIT NOTE  Subjective:  Frances [name] is a 28 y.o. [phone] at [redacted]w[redacted]d being seen today for ongoing prenatal care.  She is currently monitored for the following issues for this low-risk pregnancy and has Anemia during pregnancy; Supervision of low-risk pregnancy; Subchorionic hematoma in second trimester; LGSIL on Pap smear of cervix; and Obesity affecting pregnancy, antepartum on their problem list.  Patient reports no complaints.  Contractions: Not present. Vag. Bleeding: None.  Movement: Present. Denies leaking of fluid.   The following portions of the patient's history were reviewed and updated as appropriate: allergies, current medications, past family history, past medical history, past social history, past surgical history and problem list.   Objective:    Vitals:   01/14/24 1413  BP: 103/73  Pulse: 100  Weight: 224 lb (101.6 kg)    Fetal Status:  Fetal Heart Rate (bpm): 140 Fundal Height: 33 cm Movement: Present Presentation: Vertex  General: Alert, oriented and cooperative. Patient is in no acute distress.  Skin: Skin is warm and dry. No rash noted.   Cardiovascular: Normal heart rate noted  Respiratory: Normal respiratory effort, no problems with respiration noted  Abdomen: Soft, gravid, appropriate for gestational age.  Pain/Pressure: Absent     Pelvic: Cervical exam deferred        Extremities: Normal range of motion.  Edema: Trace (compliant with compression socks)  Mental Status: Normal mood and affect. Normal behavior. Normal judgment and thought content.   Assessment and Plan:  Pregnancy: [id number] at [redacted]w[redacted]d 1. Encounter for supervision of low-risk pregnancy in third trimester (Primary) - Doing well, feeling regular and vigorous fetal movement   2. [redacted] weeks gestation of pregnancy - Routine OB care including anticipatory guidance re GBS testing at next visit  Preterm labor symptoms and general obstetric precautions including but not limited to vaginal  bleeding, contractions, leaking of fluid and fetal movement were reviewed in detail with the patient. Please refer to After Visit Summary for other counseling recommendations.   Return in about 3 weeks (around 02/04/2024) for IN-PERSON, LOB/GBS.  Future Appointments  Date Time Provider Department Center  02/04/2024  2:00 PM [facility] PROVIDER 1 [facility]  02/04/2024  2:30 PM [facility] US1 [facility]  02/04/2024  3:35 PM Cleophas Dadds [facility]  02/11/2024  1:55 PM Cleophas Dadds [facility]  02/18/2024  1:15 PM Cleophas Dadds [facility]  02/25/2024  1:55 PM Cleophas Dadds [facility]  03/03/2024  1:15 PM Zelma Hidden, FNP [facility]    Derick Fleeting, CNM

## 2024-01-20 ENCOUNTER — Encounter: Payer: Self-pay | Admitting: *Deleted

## 2024-02-04 ENCOUNTER — Ambulatory Visit: Attending: Obstetrics and Gynecology

## 2024-02-04 ENCOUNTER — Ambulatory Visit: Payer: Self-pay | Admitting: Certified Nurse Midwife

## 2024-02-04 ENCOUNTER — Ambulatory Visit: Admitting: Maternal & Fetal Medicine

## 2024-02-04 ENCOUNTER — Other Ambulatory Visit: Payer: Self-pay

## 2024-02-04 ENCOUNTER — Other Ambulatory Visit (HOSPITAL_COMMUNITY)
Admission: RE | Admit: 2024-02-04 | Discharge: 2024-02-04 | Disposition: A | Discharge status: Home / Self Care | Source: Ambulatory Visit | Attending: Certified Nurse Midwife | Admitting: Certified Nurse Midwife

## 2024-02-04 VITALS — BP 130/72 | HR 76

## 2024-02-04 VITALS — BP 139/77 | HR 91 | Wt 228.2 lb

## 2024-02-04 DIAGNOSIS — O99213 Obesity complicating pregnancy, third trimester: Secondary | ICD-10-CM | POA: Diagnosis not present

## 2024-02-04 DIAGNOSIS — D649 Anemia, unspecified: Secondary | ICD-10-CM | POA: Diagnosis not present

## 2024-02-04 DIAGNOSIS — E669 Obesity, unspecified: Secondary | ICD-10-CM

## 2024-02-04 DIAGNOSIS — O468X2 Other antepartum hemorrhage, second trimester: Secondary | ICD-10-CM | POA: Diagnosis present

## 2024-02-04 DIAGNOSIS — O418X20 Other specified disorders of amniotic fluid and membranes, second trimester, not applicable or unspecified: Secondary | ICD-10-CM | POA: Insufficient documentation

## 2024-02-04 DIAGNOSIS — O99013 Anemia complicating pregnancy, third trimester: Secondary | ICD-10-CM | POA: Diagnosis not present

## 2024-02-04 DIAGNOSIS — O99019 Anemia complicating pregnancy, unspecified trimester: Secondary | ICD-10-CM | POA: Insufficient documentation

## 2024-02-04 DIAGNOSIS — Z3493 Encounter for supervision of normal pregnancy, unspecified, third trimester: Secondary | ICD-10-CM

## 2024-02-04 DIAGNOSIS — Z113 Encounter for screening for infections with a predominantly sexual mode of transmission: Secondary | ICD-10-CM | POA: Diagnosis present

## 2024-02-04 DIAGNOSIS — O99210 Obesity complicating pregnancy, unspecified trimester: Secondary | ICD-10-CM | POA: Diagnosis present

## 2024-02-04 DIAGNOSIS — O26893 Other specified pregnancy related conditions, third trimester: Secondary | ICD-10-CM

## 2024-02-04 DIAGNOSIS — Z3A36 36 weeks gestation of pregnancy: Secondary | ICD-10-CM

## 2024-02-04 DIAGNOSIS — O99113 Other diseases of the blood and blood-forming organs and certain disorders involving the immune mechanism complicating pregnancy, third trimester: Secondary | ICD-10-CM | POA: Diagnosis not present

## 2024-02-04 DIAGNOSIS — O4593 Premature separation of placenta, unspecified, third trimester: Secondary | ICD-10-CM

## 2024-02-04 DIAGNOSIS — R102 Pelvic and perineal pain: Secondary | ICD-10-CM

## 2024-02-04 MED ORDER — MAG-OXIDE 200 MG PO TABS: 400.0000 mg | ORAL_TABLET | Freq: Every day | ORAL | 3 refills | Status: DC

## 2024-02-04 NOTE — Progress Notes (Signed)
   PRENATAL VISIT NOTE  Subjective:  Frances [name] is a 28 y.o. [phone] at [redacted]w[redacted]d being seen today for ongoing prenatal care.  She is currently monitored for the following issues for this low-risk pregnancy and has Anemia during pregnancy; Supervision of low-risk pregnancy; Subchorionic hematoma in second trimester; LGSIL on Pap smear of cervix; and Obesity affecting pregnancy, antepartum on their problem list.  Patient reports having difficulty getting comfortable and sleeping at night .  Contractions: Not present. Vag. Bleeding: None.  Movement: Present. Denies leaking of fluid.   The following portions of the patient's history were reviewed and updated as appropriate: allergies, current medications, past family history, past medical history, past social history, past surgical history and problem list.   Objective:    Vitals:   02/04/24 1550  BP: 139/77  Pulse: 91  Weight: 103.5 kg    Fetal Status:  Fetal Heart Rate (bpm): 156 Fundal Height: 37 cm Movement: Present Presentation: Vertex  General: Alert, oriented and cooperative. Patient is in no acute distress.  Skin: Skin is warm and dry. No rash noted.   Cardiovascular: Normal heart rate noted  Respiratory: Normal respiratory effort, no problems with respiration noted  Abdomen: Soft, gravid, appropriate for gestational age.  Pain/Pressure: Present     Pelvic: Cervical exam performed in the presence of a chaperone Dilation: 3 Effacement (%): 50 Station: -3  Extremities: Normal range of motion.  Edema: None  Mental Status: Normal mood and affect. Normal behavior. Normal judgment and thought content.   Assessment and Plan:  Pregnancy: [id number] at [redacted]w[redacted]d 1. Encounter for supervision of low-risk pregnancy in third trimester (Primary) - Doing well overall. +FM. - Recommended Magnesium to facilitate muscle relaxation and sleep promotion. - Patient requesting to be placed on the elective induction list for 39 weeks.  2. [redacted] weeks  gestation of pregnancy - Routine OB care - Culture, beta strep (group b only) - Cervicovaginal ancillary only( [place])  3. Anemia during pregnancy - CBC  Preterm labor symptoms and general obstetric precautions including but not limited to vaginal bleeding, contractions, leaking of fluid and fetal movement were reviewed in detail with the patient. Please refer to After Visit Summary for other counseling recommendations.     Future Appointments  Date Time Provider Department Center  02/11/2024  1:55 PM Vannie Cornell JONELLE EDDY [facility]  02/18/2024  1:15 PM Vannie Cornell JONELLE EDDY [facility]  02/25/2024  1:55 PM Vannie Cornell JONELLE EDDY [facility]  03/03/2024  1:15 PM Delores Nidia CROME, FNP [facility]    Mitzie JINNY Molly, Student-MidWife

## 2024-02-04 NOTE — Progress Notes (Unsigned)
 N/a

## 2024-02-04 NOTE — Progress Notes (Signed)
 Patient information  Patient Name: Frances [name]  Patient MRN:   [id number]  Referring practice: MFM Referring Provider: [facility])  Problem List   Patient Active Problem List   Diagnosis Date Noted   Obesity affecting pregnancy, antepartum 12/17/2023   LGSIL on Pap smear of cervix 08/20/2023   Subchorionic hematoma in second trimester 08/14/2023   Supervision of low-risk pregnancy 08/07/2023   Anemia during pregnancy 11/20/2022    Maternal Fetal medicine Consult  Frances [name] is a 28 y.o. [id number] at [redacted]w[redacted]d here for ultrasound and consultation. Rebeckah Biermann is doing well today with no acute concerns. Today we focused on the following:   The patient is here due to a history of [facility], which has resolved.  The patient has a history of anemia and received multiple iron  transfusions.  Her most recent CBC was 2 months ago showing a hemoglobin of 9.  This should be repeated and a ferritin level drawn as well.  Sonographic findings Single intrauterine pregnancy at 36w 3d.  Fetal cardiac activity:  Observed and appears normal. Presentation: Cephalic. Interval fetal anatomy appears normal. Fetal biometry shows the estimated fetal weight at the 84 percentile. Amniotic fluid volume: Within normal limits. MVP: 3.59 cm. Placenta: Anterior.  There are limitations of prenatal ultrasound such as the inability to detect certain abnormalities due to poor visualization. Various factors such as fetal position, gestational age and maternal body habitus may increase the difficulty in visualizing the fetal anatomy.    Recommendations - CBC and ferritin level to be drawn by OB provider - No further ultrasounds are recommended at this time based on the current indications. If future indications arise (e.g. size/date discrepancy on fundal height, gestational diabetes or hypertension) and an ultrasound is to be desired at our MFM office, please send a referral.   Review  of Systems: A review of systems was performed and was negative except per HPI   Vitals and Physical Exam    02/04/2024    2:08 PM 01/14/2024    2:13 PM 12/31/2023   11:16 AM  Vitals with BMI  Weight  224 lbs 226 lbs  BMI   35.39  Systolic 130 103 869  Diastolic 72 73 71  Pulse 76 100 98    Sitting comfortably on the sonogram table Nonlabored breathing Normal rate and rhythm Abdomen is nontender  Past pregnancies OB History  Gravida Para Term Preterm AB Living  5 3 3  0 1 3  SAB IAB Ectopic Multiple Live Births  1 0 0 0 3    # Outcome Date GA Lbr Len/2nd Weight Sex Type Anes PTL Lv  5 Current           4 SAB 03/15/23 [redacted]w[redacted]d   U SAB     3 Term 02/23/21 [redacted]w[redacted]d 02:30 / 00:08 5 lb 15.1 oz (2.696 kg) M Vag-Spont EPI  LIV     Birth Comments: IOL growth restrictions  2 Term 02/22/19 [redacted]w[redacted]d  6 lb 6.8 oz (2.914 kg) F Vag-Spont None  LIV     Birth Comments: wnl, precipitous delivery- delivery at home  1 Term 02/28/17 [redacted]w[redacted]d 18:20 / 00:27 8 lb 1.8 oz (3.68 kg) F Vag-Spont EPI  LIV     Birth Comments: WNL     I spent 20 minutes reviewing the patients chart, including labs and images as well as counseling the patient about her medical conditions. Greater than 50% of the time was spent in direct face-to-face  patient counseling.  Delora Smaller  MFM, [place]   02/04/2024  3:01 PM

## 2024-02-05 LAB — CBC
Hematocrit: 34.6 % (ref 34.0–46.6)
Hemoglobin: 11.3 g/dL (ref 11.1–15.9)
MCH: 29.7 pg (ref 26.6–33.0)
MCHC: 32.7 g/dL (ref 31.5–35.7)
MCV: 91 fL (ref 79–97)
Platelets: 223 10*3/uL (ref 150–450)
RBC: 3.81 x10E6/uL (ref 3.77–5.28)
RDW: 16 % — ABNORMAL HIGH (ref 11.7–15.4)
WBC: 5.7 10*3/uL (ref 3.4–10.8)

## 2024-02-05 LAB — CERVICOVAGINAL ANCILLARY ONLY
Chlamydia: NEGATIVE
Comment: NEGATIVE
Comment: NORMAL
Neisseria Gonorrhea: NEGATIVE

## 2024-02-06 ENCOUNTER — Ambulatory Visit: Payer: Self-pay | Admitting: Obstetrics and Gynecology

## 2024-02-08 LAB — CULTURE, BETA STREP (GROUP B ONLY): Strep Gp B Culture: NEGATIVE

## 2024-02-10 DIAGNOSIS — Z0289 Encounter for other administrative examinations: Secondary | ICD-10-CM

## 2024-02-10 NOTE — Progress Notes (Unsigned)
   PRENATAL VISIT NOTE  Subjective:  Frances [name] is a 28 y.o. [phone] at [redacted]w[redacted]d being seen today for ongoing prenatal care.  She is currently monitored for the following issues for this {Blank single:19197::high-risk,low-risk} pregnancy and has Anemia during pregnancy; Supervision of low-risk pregnancy; LGSIL on Pap smear of cervix; and Obesity affecting pregnancy, antepartum on their problem list.  Patient reports {sx:[id number]}.   .  .   . Denies leaking of fluid.   The following portions of the patient's history were reviewed and updated as appropriate: allergies, current medications, past family history, past medical history, past social history, past surgical history and problem list.   Objective:    There were no vitals filed for this visit.  Fetal Status:           General: Alert, oriented and cooperative. Patient is in no acute distress.  Skin: Skin is warm and dry. No rash noted.   Cardiovascular: Normal heart rate noted  Respiratory: Normal respiratory effort, no problems with respiration noted  Abdomen: Soft, gravid, appropriate for gestational age.        Pelvic: {Blank single:19197::Cervical exam performed in the presence of a chaperone,Cervical exam deferred}        Extremities: Normal range of motion.     Mental Status: Normal mood and affect. Normal behavior. Normal judgment and thought content.   Assessment and Plan:  Pregnancy: [id number] at [redacted]w[redacted]d 1. Encounter for supervision of low-risk pregnancy in third trimester (Primary) ***  2. [redacted] weeks gestation of pregnancy ***  {Blank single:19197::Term,Preterm} labor symptoms and general obstetric precautions including but not limited to vaginal bleeding, contractions, leaking of fluid and fetal movement were reviewed in detail with the patient. Please refer to After Visit Summary for other counseling recommendations.   No follow-ups on file.  Future Appointments  Date Time Provider Department Center   02/11/2024  1:55 PM Vannie Cornell JONELLE EDDY [facility]  02/18/2024  1:15 PM Vannie Cornell JONELLE EDDY [facility]  02/21/2024  6:45 AM [facility]-LD SCHED ROOM [facility]INDC None  02/25/2024  1:55 PM Vannie Cornell JONELLE EDDY [facility]  03/03/2024  1:15 PM Delores Nidia CROME, FNP [facility]    Cornell JONELLE Vannie, CNM

## 2024-02-11 ENCOUNTER — Ambulatory Visit: Payer: Self-pay | Admitting: Certified Nurse Midwife

## 2024-02-11 ENCOUNTER — Other Ambulatory Visit: Payer: Self-pay

## 2024-02-11 VITALS — BP 115/77 | HR 86 | Wt 225.5 lb

## 2024-02-11 DIAGNOSIS — Z3493 Encounter for supervision of normal pregnancy, unspecified, third trimester: Secondary | ICD-10-CM

## 2024-02-11 DIAGNOSIS — Z3A37 37 weeks gestation of pregnancy: Secondary | ICD-10-CM

## 2024-02-12 ENCOUNTER — Inpatient Hospital Stay (HOSPITAL_COMMUNITY)
Admission: AD | Admit: 2024-02-12 | Discharge: 2024-02-14 | DRG: 807 | Disposition: A | Discharge status: Home / Self Care | Attending: Family Medicine | Admitting: Family Medicine

## 2024-02-12 ENCOUNTER — Encounter (HOSPITAL_COMMUNITY): Payer: Self-pay | Admitting: Obstetrics & Gynecology

## 2024-02-12 ENCOUNTER — Other Ambulatory Visit: Payer: Self-pay

## 2024-02-12 ENCOUNTER — Encounter (HOSPITAL_COMMUNITY): Payer: Self-pay | Admitting: Obstetrics and Gynecology

## 2024-02-12 ENCOUNTER — Inpatient Hospital Stay (HOSPITAL_COMMUNITY)
Admission: AD | Admit: 2024-02-12 | Discharge: 2024-02-12 | Disposition: A | Discharge status: Home / Self Care | Source: Home / Self Care | Attending: Obstetrics and Gynecology | Admitting: Obstetrics and Gynecology

## 2024-02-12 DIAGNOSIS — O9902 Anemia complicating childbirth: Principal | ICD-10-CM | POA: Diagnosis present

## 2024-02-12 DIAGNOSIS — R87612 Low grade squamous intraepithelial lesion on cytologic smear of cervix (LGSIL): Secondary | ICD-10-CM | POA: Diagnosis not present

## 2024-02-12 DIAGNOSIS — O26893 Other specified pregnancy related conditions, third trimester: Secondary | ICD-10-CM | POA: Diagnosis not present

## 2024-02-12 DIAGNOSIS — Z8249 Family history of ischemic heart disease and other diseases of the circulatory system: Secondary | ICD-10-CM

## 2024-02-12 DIAGNOSIS — Z0371 Encounter for suspected problem with amniotic cavity and membrane ruled out: Secondary | ICD-10-CM | POA: Insufficient documentation

## 2024-02-12 DIAGNOSIS — O99210 Obesity complicating pregnancy, unspecified trimester: Secondary | ICD-10-CM | POA: Diagnosis present

## 2024-02-12 DIAGNOSIS — O479 False labor, unspecified: Secondary | ICD-10-CM | POA: Diagnosis not present

## 2024-02-12 DIAGNOSIS — O99214 Obesity complicating childbirth: Secondary | ICD-10-CM | POA: Diagnosis present

## 2024-02-12 DIAGNOSIS — Z3A37 37 weeks gestation of pregnancy: Secondary | ICD-10-CM

## 2024-02-12 DIAGNOSIS — Z3490 Encounter for supervision of normal pregnancy, unspecified, unspecified trimester: Secondary | ICD-10-CM

## 2024-02-12 DIAGNOSIS — O471 False labor at or after 37 completed weeks of gestation: Secondary | ICD-10-CM | POA: Insufficient documentation

## 2024-02-12 DIAGNOSIS — Z3493 Encounter for supervision of normal pregnancy, unspecified, third trimester: Secondary | ICD-10-CM

## 2024-02-12 DIAGNOSIS — O99019 Anemia complicating pregnancy, unspecified trimester: Secondary | ICD-10-CM | POA: Diagnosis present

## 2024-02-12 DIAGNOSIS — O4202 Full-term premature rupture of membranes, onset of labor within 24 hours of rupture: Secondary | ICD-10-CM | POA: Diagnosis not present

## 2024-02-12 LAB — WET PREP, GENITAL
Clue Cells Wet Prep HPF POC: NONE SEEN
Sperm: NONE SEEN
Trich, Wet Prep: NONE SEEN
WBC, Wet Prep HPF POC: >=10 — AB (ref <10)
Yeast Wet Prep HPF POC: NONE SEEN

## 2024-02-12 LAB — POCT FERN TEST
POCT Fern Test: NEGATIVE
POCT Fern Test: NEGATIVE
POCT Fern Test: POSITIVE

## 2024-02-12 LAB — RUPTURE OF MEMBRANE (ROM)PLUS: Rom Plus: NEGATIVE

## 2024-02-12 MED ORDER — ONDANSETRON HCL 4 MG/2ML IJ SOLN: 4.0000 mg | Freq: Four times a day (QID) | INTRAMUSCULAR | Status: DC | PRN

## 2024-02-12 MED ORDER — HYDROXYZINE HCL 50 MG PO TABS: 50.0000 mg | ORAL_TABLET | Freq: Four times a day (QID) | ORAL | Status: DC | PRN

## 2024-02-12 MED ORDER — LACTATED RINGERS IV SOLN: 500.0000 mL | INTRAVENOUS | Status: DC | PRN

## 2024-02-12 MED ORDER — ACETAMINOPHEN 325 MG PO TABS: 650.0000 mg | ORAL_TABLET | ORAL | Status: DC | PRN

## 2024-02-12 MED ORDER — LIDOCAINE HCL (PF) 1 % IJ SOLN: 30.0000 mL | INTRAMUSCULAR | Status: DC | PRN

## 2024-02-12 MED ORDER — OXYTOCIN BOLUS FROM INFUSION: 333.0000 mL | Freq: Once | INTRAVENOUS | Status: DC

## 2024-02-12 MED ORDER — OXYTOCIN-SODIUM CHLORIDE 30-0.9 UT/500ML-% IV SOLN: 2.5000 [IU]/h | INTRAVENOUS | Status: DC

## 2024-02-12 MED ORDER — FENTANYL CITRATE (PF) 100 MCG/2ML IJ SOLN
50.0000 ug | INTRAMUSCULAR | Status: DC | PRN
  Administered 2024-02-12: 50 ug via INTRAVENOUS
  Filled 2024-02-12: qty 2

## 2024-02-12 MED ORDER — LACTATED RINGERS IV SOLN: INTRAVENOUS | Status: DC

## 2024-02-12 MED ORDER — SOD CITRATE-CITRIC ACID 500-334 MG/5ML PO SOLN: 30.0000 mL | ORAL | Status: DC | PRN

## 2024-02-12 MED ORDER — OXYCODONE-ACETAMINOPHEN 5-325 MG PO TABS: 2.0000 | ORAL_TABLET | ORAL | Status: DC | PRN

## 2024-02-12 MED ORDER — OXYCODONE-ACETAMINOPHEN 5-325 MG PO TABS: 1.0000 | ORAL_TABLET | ORAL | Status: DC | PRN

## 2024-02-12 NOTE — MAU Note (Signed)
 Frances [name] is a 28 y.o. at [redacted]w[redacted]d here in MAU reporting: around noon pt had a gush of clear thin fluid that soaked her underwear.  Pt not wearing a pad and can't tell if she feels anymore leaking.  No vag bleeding, feeling baby move as normal.  No pain.   Vitals:   02/12/24 1354 02/12/24 1355  BP: 123/66   Pulse: 95   Resp: 16   Temp: 98.3 F (36.8 C)   SpO2:  100%

## 2024-02-12 NOTE — MAU Provider Note (Signed)
 ?ROM    S [name] is a 28 y.o. [phone] pregnant female at [redacted]w[redacted]d who presents to MAU today with complaint of LOF. Pt states around 1200 7/3 had gush of clear thin fluid that soaked her underwear.  Pt is not wearing pad and is unsure if any further leaking has occurred.  Denies VB and endorses +FM.  Believes having mild contractions.    Receives care at [facility]. Prenatal records reviewed.  Pertinent items noted in HPI and remainder of comprehensive ROS otherwise negative.   O BP 123/66 (BP Location: Right Arm)   Pulse 95   Temp 98.3 F (36.8 C) (Oral)   Resp 16   LMP 05/24/2023 (Exact Date)   SpO2 100%  Physical Exam Vitals and nursing note reviewed. Exam conducted with a chaperone present.  Constitutional:      General: She is not in acute distress.    Appearance: Normal appearance. She is not ill-appearing.  HENT:     Head: Normocephalic and atraumatic.     Right Ear: External ear normal.     Left Ear: External ear normal.     Nose: Nose normal. No congestion.     Mouth/Throat:     Mouth: Mucous membranes are moist.     Pharynx: Oropharynx is clear.  Eyes:     Extraocular Movements: Extraocular movements intact.     Conjunctiva/sclera: Conjunctivae normal.  Cardiovascular:     Rate and Rhythm: Normal rate.     Pulses: Normal pulses.  Pulmonary:     Effort: Pulmonary effort is normal. No respiratory distress.  Abdominal:     General: There is no distension.     Palpations: Abdomen is soft.     Tenderness: There is no abdominal tenderness.     Comments: gravid  Genitourinary:    General: Normal vulva.     Comments: No pooling, thin mucous at external os Musculoskeletal:        General: No swelling. Normal range of motion.     Cervical back: Normal range of motion.  Skin:    General: Skin is warm and dry.  Neurological:     Mental Status: She is alert and oriented to person, place, and time. Mental status is at baseline.     Motor: No weakness.     Gait:  Gait normal.  Psychiatric:        Mood and Affect: Mood normal.        Behavior: Behavior normal.   NST: 130bpm, moderate variability, +accels, no decels, ctx q54mins    MDM: MAU Course:  Nursing fern negative. Pelvic exam w/o pooling.  Had mucous plug at external os.  Careful to avoid external os with ROM plus, also collected GC, WetPrep, and repeat Fern.   Repeat Fern negative  Wet Prep negative GC collected ROM plus negative  AP #[redacted] weeks gestation #False labor  Discharge from MAU in stable condition with strict/usual precautions Follow up at [facility] as scheduled for ongoing prenatal care  Allergies as of 02/12/2024       Reactions   Vancomycin  Other (See Comments)   Nephrotoxicity        Medication List     TAKE these medications    Blood Pressure Kit Devi 1 each by Does not apply route as needed.   Mag-Oxide 200 MG Tabs Generic drug: Magnesium Oxide -Mg Supplement Take 2 tablets (400 mg total) by mouth at bedtime. If that amount causes loose stools in the am, switch to  200mg  daily at bedtime.   multivitamin-prenatal 27-0.8 MG Tabs tablet Take 1 tablet by mouth daily at 12 noon.   ondansetron  8 MG disintegrating tablet Commonly known as: ZOFRAN -ODT Take 1 tablet (8 mg total) by mouth every 8 (eight) hours as needed for nausea or vomiting.        Jhonny Augustin BROCKS, MD 02/12/2024 4:44 PM

## 2024-02-12 NOTE — H&P (Addendum)
 LABOR AND DELIVERY ADMISSION HISTORY AND PHYSICAL NOTE  Frances [name] is a 28 y.o. female [phone] with IUP at [redacted]w[redacted]d presenting for SROM 2130 with regular contractions since the morning.   Patient reports the fetal movement as active. Patient reports uterine contraction activity as regular, every 4-5 minutes. Patient reports vaginal bleeding as bloody show. Patient describes fluid per vagina as Clear.   Patient denies headache, vision changes, chest pain, shortness of breath, right upper quadrant pain, or LE edema.  She plans on bottle feeding. Her contraception plan is: no method.  Prenatal History/Complications: PNC at [facility]:  @[redacted]w[redacted]d , CWD, normal anatomy, cephalic presentation, anterior placenta, 84%ile, EFW 3775  Pregnancy complications:  Patient Active Problem List   Diagnosis Date Noted   Normal labor 02/12/2024   Obesity affecting pregnancy, antepartum 12/17/2023   LGSIL on Pap smear of cervix 08/20/2023   Supervision of low-risk pregnancy 08/07/2023   Anemia during pregnancy 11/20/2022    Past Medical History: Past Medical History:  Diagnosis Date   Anemia    on Iron  supplements   Asthma 1999   as a child; no current problems   Chronic kidney disease    as a child   Constipation    Gall stones    Gallstones 10/23/2016   10/2016: Seen by [facility], pt states, and plans for PP follow up. Advised her on low fat diet   History of asthma 08/18/2018   As a child, no history of hospitalization/intubation, not on meds     History of kidney disease as a child 08/18/2018   Nml Creatinine     History of recurrent UTIs 09/16/2016   E.coli in urine at [facility]; RX for amox sent to pharmacy and patient notified via MyChart. Needs TOC 4 weeks after completing treatment.      History of seizure disorder 08/18/2018   As a child, no seizures since childhood     Missed abortion 03/15/2023   Obesity affecting pregnancy 10/03/2023   Seizures (HCC)    seizure disorder as a child,  treated with medication; none since   Subchorionic hematoma 08/14/2023   Moderate, diagnosed in MAU on 12/30     UTI (urinary tract infection)     Past Surgical History: Past Surgical History:  Procedure Laterality Date   BACK SURGERY     DILATION AND EVACUATION N/A 03/15/2023   Procedure: DILATATION AND EVACUATION;  Surgeon: Ozan, Jennifer, DO;  Location: [facility] OR;  Service: Gynecology;  Laterality: N/A;   IRRIGATION AND DEBRIDEMENT ABSCESS Left 06/11/2017   Procedure: IRRIGATION, DRAINAGE AND DEBRIDEMENT LEFT FLANK NECROTIZING SOFT TISSUE INFECTION;  Surgeon: Lily Boas, MD;  Location: [facility] ORS;  Service: General;  Laterality: Left;   OPERATIVE ULTRASOUND N/A 03/15/2023   Procedure: OPERATIVE ULTRASOUND;  Surgeon: Marilynn Nest, DO;  Location: [facility] OR;  Service: Gynecology;  Laterality: N/A;   TYMPANOSTOMY TUBE PLACEMENT     WOUND DEBRIDEMENT Left 06/12/2017   Procedure: DRESSING CHANGE AND DEBRIDEMENT OF WOUND;  Surgeon: Lily Boas, MD;  Location: [facility] ORS;  Service: General;  Laterality: Left;    Obstetrical History: OB History     Gravida  5   Para  3   Term  3   Preterm  0   AB  1   Living  3      SAB  1   IAB  0   Ectopic  0   Multiple  0   Live Births  3  Social History: Social History   Socioeconomic History   Marital status: Single    Spouse name: Not on file   Number of children: 1   Years of education: 12   Highest education level: 12th grade  Occupational History   Not on file  Tobacco Use   Smoking status: Never   Smokeless tobacco: Never  Vaping Use   Vaping status: Never Used  Substance and Sexual Activity   Alcohol use: No   Drug use: Not Currently    Types: Marijuana    Comment: last used 05/2018   Sexual activity: Yes    Birth control/protection: None  Other Topics Concern   Not on file  Social History Narrative   Pt received Crib from [employer] [facility] [employer] on 01/17/2021.   Social Drivers of Manufacturing engineer Strain: Low Risk  (02/11/2024)   Overall Financial Resource Strain (CARDIA)    Difficulty of Paying Living Expenses: Not hard at all  Food Insecurity: No Food Insecurity (02/13/2024)   Hunger Vital Sign    Worried About Running Out of Food in the Last Year: Never true    Ran Out of Food in the Last Year: Never true  Transportation Needs: No Transportation Needs (02/13/2024)   PRAPARE - Administrator, Civil Service (Medical): No    Lack of Transportation (Non-Medical): No  Physical Activity: Sufficiently Active (02/11/2024)   Exercise Vital Sign    Days of Exercise per Week: 5 days    Minutes of Exercise per Session: 60 min  Stress: No Stress Concern Present (02/11/2024)   [employer] of Occupational Health - Occupational Stress Questionnaire    Feeling of Stress: Not at all  Social Connections: Moderately Isolated (02/11/2024)   Social Connection and Isolation Panel    Frequency of Communication with Friends and Family: Three times a week    Frequency of Social Gatherings with Friends and Family: Once a week    Attends Religious Services: 1 to 4 times per year    Active Member of [employer] or Organizations: No    Attends Engineer, structural: Not on file    Marital Status: Never married    Family History: Family History  Problem Relation Age of Onset   Heart disease Mother        ? arrythmia   Asthma Mother    Hypertension Mother    Intellectual disability Father    Asthma Sister    Birth defects Sister        Down's syndrome    Allergies: Allergies  Allergen Reactions   Vancomycin  Other (See Comments)    Nephrotoxicity    Medications Prior to Admission  Medication Sig Dispense Refill Last Dose/Taking   Prenatal Vit-Fe Fumarate-FA (MULTIVITAMIN-PRENATAL) 27-0.8 MG TABS tablet Take 1 tablet by mouth daily at 12 noon.   02/12/2024   Blood Pressure Monitoring (BLOOD PRESSURE KIT) DEVI 1 each by Does not apply route as needed. 1 each 0     Magnesium Oxide -Mg Supplement (MAG-OXIDE) 200 MG TABS Take 2 tablets (400 mg total) by mouth at bedtime. If that amount causes loose stools in the am, switch to 200mg  daily at bedtime. 60 tablet 3    ondansetron  (ZOFRAN -ODT) 8 MG disintegrating tablet Take 1 tablet (8 mg total) by mouth every 8 (eight) hours as needed for nausea or vomiting. (Patient not taking: Reported on 02/11/2024) 20 tablet 1      Review of Systems  All  systems reviewed and negative except as stated in HPI  Physical Exam BP 121/81 (BP Location: Right Arm)   Pulse 81   Temp 98.6 F (37 C) (Oral)   Resp 20   Ht 5' 7 (1.702 m)   Wt 102.3 kg   LMP 05/24/2023 (Exact Date)   SpO2 100%   BMI 35.32 kg/m   Physical Exam Constitutional:      General: She is not in acute distress.    Appearance: She is not ill-appearing.     Comments: Breathing through contractions  Cardiovascular:     Rate and Rhythm: Normal rate.  Pulmonary:     Effort: Pulmonary effort is normal.  Abdominal:     Comments: Gravid  Musculoskeletal:        General: No swelling.  Skin:    General: Skin is warm and dry.  Neurological:     General: No focal deficit present.  Psychiatric:        Mood and Affect: Mood normal.   Presentation: cephalic by check  Fetal monitoring: Baseline: 135 bpm, Variability: Good {> 6 bpm), Accelerations: Reactive, and Decelerations: Absent Uterine activity: Q4-5  Cervix 5/70  Prenatal labs: ABO, Rh: A/Positive/-- (01/02 0937) Antibody: Negative (01/02 0937) Rubella: 1.86 (01/02 0937) RPR: Non Reactive (04/23 0929)  HBsAg: Negative (01/02 0937)  HIV: Non Reactive (04/23 0929)  GC/Chlamydia:  Neisseria Gonorrhea  Date Value Ref Range Status  02/04/2024 Negative  Final   Chlamydia  Date Value Ref Range Status  02/04/2024 Negative  Final   GBS: Negative/-- (06/25 1703)   Prenatal Transfer Tool  Maternal Diabetes: No Genetic Screening: Normal Maternal Ultrasounds/Referrals: Normal Fetal  Ultrasounds or other Referrals:  None Maternal Substance Abuse:  No Significant Maternal Medications:  None Significant Maternal Lab Results: Group B Strep negative  Results for orders placed or performed during the hospital encounter of 02/12/24 (from the past 24 hours)  POCT fern test   Collection Time: 02/12/24 11:16 PM  Result Value Ref Range   POCT Fern Test Positive = ruptured amniotic membanes   Results for orders placed or performed during the hospital encounter of 02/12/24 (from the past 24 hours)  POCT fern test   Collection Time: 02/12/24  2:13 PM  Result Value Ref Range   POCT Fern Test Negative = intact amniotic membranes   Wet prep, genital   Collection Time: 02/12/24  3:48 PM  Result Value Ref Range   Yeast Wet Prep HPF POC NONE SEEN NONE SEEN   Trich, Wet Prep NONE SEEN NONE SEEN   Clue Cells Wet Prep HPF POC NONE SEEN NONE SEEN   WBC, Wet Prep HPF POC >=10 (A) <10   Sperm NONE SEEN   Rupture of Membrane (ROM) Plus   Collection Time: 02/12/24  3:48 PM  Result Value Ref Range   Rom Plus NEGATIVE   POCT fern test   Collection Time: 02/12/24  4:19 PM  Result Value Ref Range   POCT Fern Test Negative = intact amniotic membranes     Assessment: Tresha Muzio is a 28 y.o. H4E6986 at [redacted]w[redacted]d here for SROM at 2130.  #Labor: Expectant management #Pain: IV pain meds PRN, epidural upon request #FHT: Category I #GBS/ID: Negative #MOF: bottle feeding #MOC: no method  Frances CHRISTELLA Moats, MD [facility], [facility]  02/13/2024, 12:05 AM

## 2024-02-12 NOTE — MAU Note (Signed)
.  MAU Labor Triage Note:  .Frances [name] is a 28 y.o. at [redacted]w[redacted]d here in MAU reporting:  Contractions every: 4-5 minutes Onset of ctx: This morning Pain Score: 8  Pain Location: Abdomen  ROM: large gush of fluid around 9:30 pm Vaginal Bleeding: REPORTS BLOODY SHOW WHEN HER WATER BROKE Last SVE: 4cm today Labor Pain Management Plan: Planning epidural and IV pain medication  GBS: Negative  Fetal Movement: Reports positive FM FHT:    Vitals:   02/12/24 2240 02/12/24 2244  BP:  121/81  Pulse:  81  Resp:  20  Temp:  98.6 F (37 C)  SpO2: 99% 100%       Lab orders placed from triage: MAU Labor Eval OB Office: Faculty

## 2024-02-13 ENCOUNTER — Inpatient Hospital Stay (HOSPITAL_COMMUNITY): Admitting: Anesthesiology

## 2024-02-13 ENCOUNTER — Encounter (HOSPITAL_COMMUNITY): Payer: Self-pay | Admitting: Family Medicine

## 2024-02-13 DIAGNOSIS — O99214 Obesity complicating childbirth: Secondary | ICD-10-CM | POA: Diagnosis not present

## 2024-02-13 DIAGNOSIS — Z3A37 37 weeks gestation of pregnancy: Secondary | ICD-10-CM | POA: Diagnosis not present

## 2024-02-13 DIAGNOSIS — O4202 Full-term premature rupture of membranes, onset of labor within 24 hours of rupture: Secondary | ICD-10-CM | POA: Diagnosis not present

## 2024-02-13 LAB — TYPE AND SCREEN
ABO/RH(D): A POS
Antibody Screen: NEGATIVE

## 2024-02-13 LAB — CBC
HCT: 31.5 % — ABNORMAL LOW (ref 36.0–46.0)
Hemoglobin: 10.7 g/dL — ABNORMAL LOW (ref 12.0–15.0)
MCH: 30.1 pg (ref 26.0–34.0)
MCHC: 34 g/dL (ref 30.0–36.0)
MCV: 88.5 fL (ref 80.0–100.0)
Platelets: 222 K/uL (ref 150–400)
RBC: 3.56 MIL/uL — ABNORMAL LOW (ref 3.87–5.11)
RDW: 15.7 % — ABNORMAL HIGH (ref 11.5–15.5)
WBC: 6.6 K/uL (ref 4.0–10.5)
nRBC: 0 % (ref 0.0–0.2)

## 2024-02-13 LAB — RPR: RPR Ser Ql: NONREACTIVE

## 2024-02-13 MED ORDER — DIPHENHYDRAMINE HCL 50 MG/ML IJ SOLN: 12.5000 mg | INTRAMUSCULAR | Status: DC | PRN

## 2024-02-13 MED ORDER — ONDANSETRON HCL 4 MG/2ML IJ SOLN: 4.0000 mg | INTRAMUSCULAR | Status: DC | PRN

## 2024-02-13 MED ORDER — OXYTOCIN-SODIUM CHLORIDE 30-0.9 UT/500ML-% IV SOLN
1.0000 m[IU]/min | INTRAVENOUS | Status: DC
  Filled 2024-02-13: qty 500

## 2024-02-13 MED ORDER — MEDROXYPROGESTERONE ACETATE 150 MG/ML IM SUSP: 150.0000 mg | INTRAMUSCULAR | Status: DC | PRN

## 2024-02-13 MED ORDER — ZOLPIDEM TARTRATE 5 MG PO TABS: 5.0000 mg | ORAL_TABLET | Freq: Every evening | ORAL | Status: DC | PRN

## 2024-02-13 MED ORDER — DIPHENHYDRAMINE HCL 25 MG PO CAPS: 25.0000 mg | ORAL_CAPSULE | Freq: Four times a day (QID) | ORAL | Status: DC | PRN

## 2024-02-13 MED ORDER — WITCH HAZEL-GLYCERIN EX PADS: 1.0000 | MEDICATED_PAD | CUTANEOUS | Status: DC | PRN

## 2024-02-13 MED ORDER — SENNOSIDES-DOCUSATE SODIUM 8.6-50 MG PO TABS
2.0000 | ORAL_TABLET | Freq: Every day | ORAL | Status: DC
  Administered 2024-02-14: 2 via ORAL
  Filled 2024-02-13: qty 2

## 2024-02-13 MED ORDER — IBUPROFEN 800 MG PO TABS
800.0000 mg | ORAL_TABLET | Freq: Three times a day (TID) | ORAL | Status: DC
  Administered 2024-02-13 – 2024-02-14 (×3): 800 mg via ORAL
  Filled 2024-02-13 (×3): qty 1

## 2024-02-13 MED ORDER — DIBUCAINE (PERIANAL) 1 % EX OINT: 1.0000 | TOPICAL_OINTMENT | CUTANEOUS | Status: DC | PRN

## 2024-02-13 MED ORDER — LACTATED RINGERS IV SOLN: 500.0000 mL | Freq: Once | INTRAVENOUS | Status: DC

## 2024-02-13 MED ORDER — OXYCODONE HCL 5 MG PO TABS: 5.0000 mg | ORAL_TABLET | Freq: Four times a day (QID) | ORAL | Status: DC | PRN

## 2024-02-13 MED ORDER — PHENYLEPHRINE 80 MCG/ML (10ML) SYRINGE FOR IV PUSH (FOR BLOOD PRESSURE SUPPORT): 80.0000 ug | PREFILLED_SYRINGE | INTRAVENOUS | Status: DC | PRN

## 2024-02-13 MED ORDER — SIMETHICONE 80 MG PO CHEW: 80.0000 mg | CHEWABLE_TABLET | ORAL | Status: DC | PRN

## 2024-02-13 MED ORDER — COCONUT OIL OIL: 1.0000 | TOPICAL_OIL | Status: DC | PRN

## 2024-02-13 MED ORDER — EPHEDRINE 5 MG/ML INJ: 10.0000 mg | INTRAVENOUS | Status: DC | PRN

## 2024-02-13 MED ORDER — LIDOCAINE HCL (PF) 1 % IJ SOLN
INTRAMUSCULAR | Status: DC | PRN
Start: 2024-02-13 — End: 2024-02-13
  Administered 2024-02-13: 4 mL via EPIDURAL
  Administered 2024-02-13: 5 mL via EPIDURAL

## 2024-02-13 MED ORDER — TERBUTALINE SULFATE 1 MG/ML IJ SOLN: 0.2500 mg | Freq: Once | INTRAMUSCULAR | Status: DC | PRN

## 2024-02-13 MED ORDER — BENZOCAINE-MENTHOL 20-0.5 % EX AERO: 1.0000 | INHALATION_SPRAY | CUTANEOUS | Status: DC | PRN

## 2024-02-13 MED ORDER — FENTANYL-BUPIVACAINE-NACL 0.5-0.125-0.9 MG/250ML-% EP SOLN
12.0000 mL/h | EPIDURAL | Status: DC | PRN
  Administered 2024-02-13: 12 mL/h via EPIDURAL
  Filled 2024-02-13: qty 250

## 2024-02-13 MED ORDER — PRENATAL MULTIVITAMIN CH
1.0000 | ORAL_TABLET | Freq: Every day | ORAL | Status: DC
Start: 2024-02-13 — End: 2024-02-14
  Administered 2024-02-13 – 2024-02-14 (×2): 1 via ORAL
  Filled 2024-02-13 (×2): qty 1

## 2024-02-13 MED ORDER — ONDANSETRON HCL 4 MG PO TABS: 4.0000 mg | ORAL_TABLET | ORAL | Status: DC | PRN

## 2024-02-13 MED ORDER — ACETAMINOPHEN 500 MG PO TABS
1000.0000 mg | ORAL_TABLET | Freq: Three times a day (TID) | ORAL | Status: DC
  Administered 2024-02-13 – 2024-02-14 (×2): 1000 mg via ORAL
  Filled 2024-02-13 (×2): qty 2

## 2024-02-13 MED ORDER — OXYCODONE HCL 5 MG PO TABS: 10.0000 mg | ORAL_TABLET | Freq: Four times a day (QID) | ORAL | Status: DC | PRN

## 2024-02-13 NOTE — Patient Instructions (Signed)
 If interested in an outpatient lactation consult in office or virtually please reach out to us  at [facility] (First Floor) [address] [place] Please call [phone] and press [phone] for lactation.    Melodi Sprung, [facility] for [facility]

## 2024-02-13 NOTE — Anesthesia Preprocedure Evaluation (Signed)
 Anesthesia Evaluation  Patient identified by MRN, date of birth, ID band Patient awake    Reviewed: Allergy  & Precautions, NPO status , Patient's Chart, lab work & pertinent test results  History of Anesthesia Complications Negative for: history of anesthetic complications  Airway Mallampati: II   Neck ROM: Full    Dental   Pulmonary asthma , Patient abstained from smoking.   Pulmonary exam normal        Cardiovascular negative cardio ROS Normal cardiovascular exam     Neuro/Psych Seizures -, Well Controlled,   negative psych ROS   GI/Hepatic negative GI ROS, Neg liver ROS,,,  Endo/Other   Obesity   Renal/GU negative Renal ROS     Musculoskeletal negative musculoskeletal ROS (+)    Abdominal   Peds  Hematology  (+) Blood dyscrasia, anemia  Plt 222k    Anesthesia Other Findings   Reproductive/Obstetrics (+) Pregnancy                              Anesthesia Physical Anesthesia Plan  ASA: 2  Anesthesia Plan: Epidural   Post-op Pain Management: Minimal or no pain anticipated   Induction:   PONV Risk Score and Plan: 2 and Treatment may vary due to age or medical condition  Airway Management Planned: Natural Airway  Additional Equipment: None  Intra-op Plan:   Post-operative Plan:   Informed Consent: I have reviewed the patients History and Physical, chart, labs and discussed the procedure including the risks, benefits and alternatives for the proposed anesthesia with the patient or authorized representative who has indicated his/her understanding and acceptance.       Plan Discussed with: Anesthesiologist  Anesthesia Plan Comments: (Labs reviewed. Platelets acceptable, patient not taking any blood thinning medications. Per RN, FHR tracing reported to be stable enough for sitting procedure. Risks and benefits discussed with patient, including PDPH, backache, epidural  hematoma, failed epidural, blood pressure changes, allergic reaction, and nerve injury. Patient expressed understanding and wished to proceed.)        Anesthesia Quick Evaluation

## 2024-02-13 NOTE — Anesthesia Postprocedure Evaluation (Signed)
 Anesthesia Post Note  Patient: Frances [name]  Procedure(s) Performed: AN AD HOC LABOR EPIDURAL     Patient location during evaluation: Mother Baby Anesthesia Type: Epidural Level of consciousness: awake and alert Pain management: pain level controlled Vital Signs Assessment: post-procedure vital signs reviewed and stable Respiratory status: spontaneous breathing, nonlabored ventilation and respiratory function stable Cardiovascular status: stable Postop Assessment: no headache, no backache and epidural receding Anesthetic complications: no   No notable events documented.  Last Vitals:  Vitals:   02/13/24 0615 02/13/24 0640  BP: (!) 122/58 125/78  Pulse: 75   Resp:  18  Temp:  36.8 C  SpO2:  99%    Last Pain:  Vitals:   02/13/24 0640  TempSrc: Oral  PainSc: 0-No pain   Pain Goal:                Epidural/Spinal Function Cutaneous sensation: Normal sensation (02/13/24 0640), Patient able to flex knees: Yes (02/13/24 0640), Patient able to lift hips off bed: Yes (02/13/24 0640), Back pain beyond tenderness at insertion site: No (02/13/24 0640), Progressively worsening motor and/or sensory loss: No (02/13/24 0640), Bowel and/or bladder incontinence post epidural: No (02/13/24 0640)  Dietrick Barris

## 2024-02-13 NOTE — Discharge Summary (Signed)
 Postpartum Discharge Summary  Date of Service updated***     Patient Name: Frances [name] DOB: 02-08-1996 MRN: [id number]  Date of admission: 02/12/2024 Delivery date:02/13/2024 Delivering provider: NICHOLAUS ALMARIE HERO Date of discharge: 02/13/2024  Admitting diagnosis: Normal labor [O80, Z37.9] Intrauterine pregnancy: [redacted]w[redacted]d     Secondary diagnosis:  Principal Problem:   NSVD (normal spontaneous vaginal delivery) Active Problems:   Anemia during pregnancy   Supervision of low-risk pregnancy   LGSIL on Pap smear of cervix   Obesity affecting pregnancy, antepartum  Additional problems: ***    Discharge diagnosis: Term Pregnancy Delivered                                              Post partum procedures:{Postpartum procedures:23558} Augmentation: N/A Complications: None  Hospital course: Onset of Labor With Vaginal Delivery      28 y.o. yo [id number] at [redacted]w[redacted]d was admitted in Latent Labor on 02/12/2024 for SROM. Labor course was complicated by none.  Membrane Rupture Time/Date: 9:30 PM,02/12/2024  Delivery Method:Vaginal, Spontaneous Operative Delivery:N/A Episiotomy: None Lacerations:  None Patient had a postpartum course complicated by ***.  She is ambulating, tolerating a regular diet, passing flatus, and urinating well. Patient is discharged home in stable condition on 02/13/24.  Newborn Data: Birth date:02/13/2024 Birth time:4:43 AM Gender:Female Living status:Living Apgars:7 ,9  Weight:   Magnesium Sulfate received: No BMZ received: No Rhophylac:No MMR:No T-DaP:Given prenatally Flu: N/A RSV Vaccine received: No Transfusion:{Transfusion received:30440034}  Immunizations received: Immunization History  Administered Date(s) Administered   HPV 9-valent 05/18/2010, 07/20/2010, 11/05/2011   Influenza,inj,Quad PF,6+ Mos 08/22/2016, 06/24/2017, 08/18/2018, 06/07/2019, 08/09/2020   Influenza-Unspecified 06/03/2022, 06/11/2023   MMR 02/24/2019   Tdap 11/21/2016,  02/02/2019, 12/06/2020, 12/03/2023    Physical exam  Vitals:   02/13/24 0310 02/13/24 0315 02/13/24 0330 02/13/24 0400  BP: (!) 101/43 (!) 99/52 (!) 100/50 (!) 102/47  Pulse: 73 90 82 71  Resp:      Temp:      TempSrc:      SpO2: 100% 99%  99%  Weight:      Height:       General: {Exam; general:21111117} Lochia: {Desc; appropriate/inappropriate:30686::appropriate} Uterine Fundus: {Desc; firm/soft:30687} Incision: {Exam; incision:21111123} DVT Evaluation: {Exam; icu:7888877} Labs: Lab Results  Component Value Date   WBC 6.6 02/13/2024   HGB 10.7 (L) 02/13/2024   HCT 31.5 (L) 02/13/2024   MCV 88.5 02/13/2024   PLT 222 02/13/2024      Latest Ref Rng & Units 09/02/2023    2:50 PM  CMP  Glucose 70 - 99 mg/dL 95   BUN 6 - 20 mg/dL 8   Creatinine 9.55 - 8.99 mg/dL 9.46   Sodium 864 - 854 mmol/L 135   Potassium 3.5 - 5.1 mmol/L 3.3   Chloride 98 - 111 mmol/L 105   CO2 22 - 32 mmol/L 23   Calcium 8.9 - 10.3 mg/dL 9.3   Total Protein 6.5 - 8.1 g/dL 6.5   Total Bilirubin 0.0 - 1.2 mg/dL 0.3   Alkaline Phos 38 - 126 U/L 55   AST 15 - 41 U/L 20   ALT 0 - 44 U/L 19    Edinburgh Score:    02/24/2021    6:46 AM  Edinburgh Postnatal Depression Scale Screening Tool  I have been able to laugh and see the funny side of things. 0  I have looked forward with enjoyment to things. 0  I have blamed myself unnecessarily when things went wrong. 0  I have been anxious or worried for no good reason. 0  I have felt scared or panicky for no good reason. 0  Things have been getting on top of me. 0  I have been so unhappy that I have had difficulty sleeping. 0  I have felt sad or miserable. 0  I have been so unhappy that I have been crying. 0  The thought of harming myself has occurred to me. 0  Edinburgh Postnatal Depression Scale Total 0      Data saved with a previous flowsheet row definition   No data recorded  After visit meds:  Allergies as of 02/13/2024       Reactions    Vancomycin  Other (See Comments)   Nephrotoxicity     Med Rec must be completed prior to using this [facility]***        Discharge home in stable condition Infant Feeding: {Baby feeding:23562} Infant Disposition:{CHL IP OB HOME WITH FNUYZM:76418} Discharge instruction: per After Visit Summary and Postpartum booklet. Activity: Advance as tolerated. Pelvic rest for 6 weeks.  Diet: {OB ipzu:78888878} Future Appointments: Future Appointments  Date Time Provider Department Center  02/18/2024  1:15 PM Vannie Cornell SAUNDERS, CNM [facility]   Follow up Visit:  Message sent to [facility] 7/4  Please schedule this patient for a In person postpartum visit in 6 weeks with the following provider: Jamilla. Additional Postpartum F/U:None  Low risk pregnancy complicated by: none Delivery mode:  Vaginal, Spontaneous Anticipated Birth Control:  no method   02/13/2024 Almarie CHRISTELLA Moats, MD

## 2024-02-13 NOTE — Anesthesia Procedure Notes (Signed)
 Epidural Patient location during procedure: OB Start time: 02/13/2024 2:11 AM End time: 02/13/2024 2:14 AM  Staffing Anesthesiologist: Lucious Debby BRAVO, MD Performed: anesthesiologist   Preanesthetic Checklist Completed: patient identified, IV checked, risks and benefits discussed, monitors and equipment checked, pre-op evaluation and timeout performed  Epidural Patient position: sitting Prep: DuraPrep Patient monitoring: continuous pulse ox and blood pressure Approach: midline Location: L2-L3 Injection technique: LOR saline  Needle:  Needle type: Tuohy  Needle gauge: 17 G Needle length: 9 cm Needle insertion depth: 7 cm Catheter size: 19 Gauge Catheter at skin depth: 12 cm Test dose: negative and Other (1% lidocaine )  Assessment Events: blood not aspirated and no cerebrospinal fluid  Additional Notes Patient identified. Risks including, but not limited to, bleeding, infection, nerve damage, paralysis, inadequate analgesia, blood pressure changes, nausea, vomiting, allergic reaction, postpartum back pain, itching, and headache were discussed. Patient expressed understanding and wished to proceed. Sterile prep and drape, including hand hygiene, mask, and sterile gloves were used. The patient was positioned and the spine was prepped. The skin was anesthetized with lidocaine . No paraesthesia or other complication noted. The patient did not experience any signs of intravascular injection such as tinnitus or metallic taste in mouth, nor signs of intrathecal spread such as rapid motor block. Please see nursing notes for vital signs. The patient tolerated the procedure well.   Debby Lucious, MDReason for block:procedure for pain

## 2024-02-14 MED ORDER — IBUPROFEN 800 MG PO TABS: 800.0000 mg | ORAL_TABLET | Freq: Three times a day (TID) | ORAL | 0 refills | Status: DC

## 2024-02-16 LAB — GC/CHLAMYDIA PROBE AMP (~~LOC~~) NOT AT ARMC
Chlamydia: NEGATIVE
Comment: NEGATIVE
Comment: NORMAL
Neisseria Gonorrhea: NEGATIVE

## 2024-02-18 ENCOUNTER — Encounter: Payer: Self-pay | Admitting: Certified Nurse Midwife

## 2024-02-20 ENCOUNTER — Encounter: Payer: Self-pay | Admitting: Certified Nurse Midwife

## 2024-02-21 ENCOUNTER — Inpatient Hospital Stay (HOSPITAL_COMMUNITY): Admission: RE | Admit: 2024-02-21 | Source: Home / Self Care | Admitting: Obstetrics & Gynecology

## 2024-02-21 ENCOUNTER — Inpatient Hospital Stay (HOSPITAL_COMMUNITY)

## 2024-02-24 ENCOUNTER — Telehealth (HOSPITAL_COMMUNITY): Payer: Self-pay | Admitting: *Deleted

## 2024-02-24 NOTE — Telephone Encounter (Signed)
 02/24/2024  Name: Frances [name] MRN: [id number] DOB: 16-Mar-1996  Reason for Call:  Transition of Care Hospital Discharge Call  Contact Status: Patient Contact Status: Complete  Language assistant needed:          Follow-Up Questions: Do You Have Any Concerns About Your Health As You Heal From Delivery?: No Do You Have Any Concerns About Your Infants Health?: No  Edinburgh Postnatal Depression Scale:  In the Past 7 Days: I have been able to laugh and see the funny side of things.: As much as I always could I have looked forward with enjoyment to things.: As much as I ever did I have blamed myself unnecessarily when things went wrong.: No, never I have been anxious or worried for no good reason.: No, not at all I have felt scared or panicky for no good reason.: No, not at all Things have been getting on top of me.: No, I have been coping as well as ever I have been so unhappy that I have had difficulty sleeping.: Not at all I have felt sad or miserable.: No, not at all I have been so unhappy that I have been crying.: No, never The thought of harming myself has occurred to me.: Never Van Postnatal Depression Scale Total: 0  PHQ2-9 Depression Scale:     Discharge Follow-up: Edinburgh score requires follow up?: No Patient was advised of the following resources:: Breastfeeding Support Group, Support Group Requested email information on Baby and Me.  Post-discharge interventions: Reviewed Newborn Safe Sleep Practices  Signature Allean IVAR Carton, RN, 02/24/24, [phone]

## 2024-02-25 ENCOUNTER — Encounter: Payer: Self-pay | Admitting: Certified Nurse Midwife

## 2024-03-03 ENCOUNTER — Encounter: Payer: Self-pay | Admitting: Obstetrics and Gynecology

## 2024-03-08 DIAGNOSIS — Z0289 Encounter for other administrative examinations: Secondary | ICD-10-CM

## 2024-03-15 DIAGNOSIS — Z0289 Encounter for other administrative examinations: Secondary | ICD-10-CM

## 2024-03-30 ENCOUNTER — Other Ambulatory Visit: Payer: Self-pay

## 2024-03-30 ENCOUNTER — Ambulatory Visit: Admitting: Nurse Practitioner

## 2024-03-30 ENCOUNTER — Encounter: Payer: Self-pay | Admitting: Nurse Practitioner

## 2024-03-30 DIAGNOSIS — K5900 Constipation, unspecified: Secondary | ICD-10-CM

## 2024-03-30 DIAGNOSIS — R87612 Low grade squamous intraepithelial lesion on cytologic smear of cervix (LGSIL): Secondary | ICD-10-CM

## 2024-03-30 NOTE — Progress Notes (Signed)
 Post Partum Visit Note  Frances [name] is a 28 y.o. [id number] female who presents for a postpartum visit. She is 6 weeks postpartum following a normal spontaneous vaginal delivery.  I have fully reviewed the prenatal and intrapartum course. The delivery was at 37/6 gestational weeks.  Anesthesia: . Postpartum course has been uncomplicated. Baby is doing well. Baby is feeding by epiduralbottle - Similac Advance. Bleeding no bleeding. Bowel function is constipated. Bladder function is normal. Patient is not sexually active. Contraception method is none. Postpartum depression screening: negative.   The pregnancy intention screening data noted above was reviewed. Potential methods of contraception were discussed. The patient elected to proceed with No data recorded.   Edinburgh Postnatal Depression Scale - 03/30/24 0848       Edinburgh Postnatal Depression Scale:  In the Past 7 Days   I have been able to laugh and see the funny side of things. 0    I have looked forward with enjoyment to things. 0    I have blamed myself unnecessarily when things went wrong. 0    I have been anxious or worried for no good reason. 0    I have felt scared or panicky for no good reason. 0    Things have been getting on top of me. 0    I have been so unhappy that I have had difficulty sleeping. 0    I have felt sad or miserable. 0    I have been so unhappy that I have been crying. 0    The thought of harming myself has occurred to me. 0    Edinburgh Postnatal Depression Scale Total 0          Health Maintenance Due  Topic Date Due   Hepatitis B Vaccines 19-59 Average Risk (1 of 3 - 19+ 3-dose series) Never done   COVID-19 Vaccine (1 - 2024-25 season) Never done   INFLUENZA VACCINE  03/12/2024    The following portions of the patient's history were reviewed and updated as appropriate: allergies, current medications, past family history, past medical history, past social history, past surgical history,  and problem list.  Review of Systems Pertinent items noted in HPI and remainder of comprehensive ROS otherwise negative.  Objective:  BP 113/76   Pulse 76   Ht 5' 7 (1.702 m)   Wt 193 lb 3.2 oz (87.6 kg)   BMI 30.26 kg/m    General:  alert, cooperative, and appears stated age   Breasts:  normal  Lungs: clear to auscultation bilaterally  Heart:  RRR  Abdomen: soft, non-tender; bowel sounds normal; no masses,  no organomegaly   Wound NA  GU exam:  not indicated       Assessment:   1. LGSIL on Pap smear of cervix - Patient to schedule Colposcopy today  2. Postpartum care and examination - Nm Exam - Planning on using Barrier methods for contraception - Menses returned   3. Constipation, unspecified constipation type - Miralax      NM postpartum exam.   Plan:    Essential components of care per ACOG recommendations:  1.  Mood and well being: Patient with negative depression screening today. Reviewed local resources for support.  - Patient tobacco use? No.   - hx of drug use? No.    2. Infant care and feeding:  -Patient currently breastmilk feeding? No.  -Social determinants of health (SDOH) reviewed in EPIC. No concerns at this time.  3. Sexuality, contraception and  birth spacing - Patient does not want a pregnancy in the next year.  Desired family size is unknown children.   - Reviewed reproductive life planning. Reviewed contraceptive methods based on pt preferences and effectiveness.  Patient desired female condoms and discussed use of spermicide with condoms  today.   - Discussed birth spacing of 18 months  4. Sleep and fatigue -Encouraged family/partner/community support of 4 hrs of uninterrupted sleep to help with mood and fatigue  5. Physical Recovery  - Discussed patients delivery and complications. She describes her labor as good. - Patient had a Vaginal, no problems at delivery. Patient had a none laceration. Perineal healing reviewed. Patient expressed  understanding - Patient has urinary incontinence? No. - Patient is safe to resume physical and sexual activity  6.  Health Maintenance - HM due items addressed Yes - Last pap smear  Diagnosis  Date Value Ref Range Status  10/08/2023 - Low grade squamous intraepithelial lesion (LSIL) (A)  Final   Pap smear not done at today's visit. ( Colposcopy TBS) -Breast Cancer screening indicated? No.   7. Chronic Disease/Pregnancy Condition follow up: None  - PCP follow up  Olam DELENA Dalton, NP [facility] [employer], [facility]

## 2024-05-10 NOTE — Progress Notes (Unsigned)
    GYNECOLOGY OFFICE COLPOSCOPY PROCEDURE NOTE  28 y.o. [id number] here for colposcopy for abnormal pap  Pap Hx: 10/2019: Normal pap 08/2023: LSIL 09/2023: LSIL/HPV pos, non 16/18  Pregnancy test:  {Blank single:19197::positive,negative,not indicated} Gardasil:  completed. Discussed role for HPV in cervical dysplasia, need for surveillance.  Informed consent and review of risks, benefit and alternatives performed. Written consent given.   Speculum inserted into patient's vagina assuring full view of cervix and vaginal walls. 3 swabs  of vinegar solution applied to the cervix and vaginal walls and colposcope was used to observe both the cervix and vaginal walls.   Colposcopy adequate? {yes/no:20286}  {Findings; colposcopy:728}; corresponding biopsies obtained.  ECC collected.  All specimens were labeled and sent to pathology.  Monsel's applied to biopsy sites for good hemostasis and speculum removed.  Pt tolerated well with minimal pain and bleeding.   Patient was given post procedure instructions.  Will follow up pathology and manage accordingly; patient will be contacted with results and recommendations.  Routine preventative health maintenance measures emphasized.    Vina Solian, MD, FACOG Obstetrician & Gynecologist, [facility] for [facility], [facility]

## 2024-05-13 ENCOUNTER — Other Ambulatory Visit: Payer: Self-pay

## 2024-05-13 ENCOUNTER — Other Ambulatory Visit (HOSPITAL_COMMUNITY)
Admission: RE | Admit: 2024-05-13 | Discharge: 2024-05-13 | Disposition: A | Discharge status: Home / Self Care | Source: Ambulatory Visit | Attending: Obstetrics and Gynecology | Admitting: Obstetrics and Gynecology

## 2024-05-13 ENCOUNTER — Encounter: Payer: Self-pay | Admitting: Obstetrics and Gynecology

## 2024-05-13 ENCOUNTER — Ambulatory Visit (INDEPENDENT_AMBULATORY_CARE_PROVIDER_SITE_OTHER): Admitting: Obstetrics and Gynecology

## 2024-05-13 VITALS — BP 113/78 | HR 80 | Wt 192.2 lb

## 2024-05-13 DIAGNOSIS — R87619 Unspecified abnormal cytological findings in specimens from cervix uteri: Secondary | ICD-10-CM | POA: Insufficient documentation

## 2024-05-13 DIAGNOSIS — Z3202 Encounter for pregnancy test, result negative: Secondary | ICD-10-CM

## 2024-05-13 DIAGNOSIS — N72 Inflammatory disease of cervix uteri: Secondary | ICD-10-CM | POA: Diagnosis not present

## 2024-05-13 DIAGNOSIS — N870 Mild cervical dysplasia: Secondary | ICD-10-CM | POA: Diagnosis not present

## 2024-05-13 LAB — POCT PREGNANCY, URINE: Preg Test, Ur: NEGATIVE

## 2024-05-14 LAB — SURGICAL PATHOLOGY

## 2024-05-17 ENCOUNTER — Ambulatory Visit: Payer: Self-pay | Admitting: Obstetrics and Gynecology
